# Patient Record
Sex: Female | Born: 1955 | Race: White | Hispanic: No | Smoking: Never smoker
Health system: Southern US, Community
[De-identification: ages and names within clinical notes are randomized; demographics above are authoritative.]

## PROBLEM LIST (undated history)

## (undated) DIAGNOSIS — R569 Unspecified convulsions: Secondary | ICD-10-CM

## (undated) DIAGNOSIS — B019 Varicella without complication: Secondary | ICD-10-CM

## (undated) DIAGNOSIS — Z973 Presence of spectacles and contact lenses: Secondary | ICD-10-CM

## (undated) DIAGNOSIS — Z950 Presence of cardiac pacemaker: Secondary | ICD-10-CM

## (undated) DIAGNOSIS — Z86010 Personal history of colonic polyps: Secondary | ICD-10-CM

## (undated) DIAGNOSIS — H9190 Unspecified hearing loss, unspecified ear: Secondary | ICD-10-CM

## (undated) DIAGNOSIS — G40909 Epilepsy, unspecified, not intractable, without status epilepticus: Secondary | ICD-10-CM

## (undated) DIAGNOSIS — H902 Conductive hearing loss, unspecified: Secondary | ICD-10-CM

## (undated) HISTORY — DX: Conductive hearing loss, unspecified: H90.2

## (undated) HISTORY — DX: Presence of cardiac pacemaker: Z95.0

## (undated) HISTORY — DX: Epilepsy, unspecified, not intractable, without status epilepticus: G40.909

## (undated) HISTORY — DX: Unspecified convulsions: R56.9

## (undated) HISTORY — DX: Varicella without complication: B01.9

## (undated) HISTORY — DX: Unspecified hearing loss, unspecified ear: H91.90

## (undated) HISTORY — DX: Personal history of colonic polyps: Z86.010

## (undated) HISTORY — PX: HIP ARTHROPLASTY: SHX981

---

## 1997-10-07 ENCOUNTER — Other Ambulatory Visit: Admission: RE | Admit: 1997-10-07 | Discharge: 1997-10-07 | Payer: Self-pay

## 2001-04-05 ENCOUNTER — Emergency Department (HOSPITAL_COMMUNITY): Admission: EM | Admit: 2001-04-05 | Discharge: 2001-04-05 | Payer: Self-pay | Admitting: Emergency Medicine

## 2001-04-05 ENCOUNTER — Encounter: Payer: Self-pay | Admitting: Emergency Medicine

## 2010-07-31 HISTORY — PX: PACEMAKER INSERTION: SHX728

## 2011-05-22 DIAGNOSIS — G40209 Localization-related (focal) (partial) symptomatic epilepsy and epileptic syndromes with complex partial seizures, not intractable, without status epilepticus: Secondary | ICD-10-CM | POA: Diagnosis not present

## 2011-05-31 DIAGNOSIS — W19XXXA Unspecified fall, initial encounter: Secondary | ICD-10-CM | POA: Diagnosis not present

## 2011-05-31 DIAGNOSIS — R55 Syncope and collapse: Secondary | ICD-10-CM | POA: Diagnosis not present

## 2011-05-31 DIAGNOSIS — Z950 Presence of cardiac pacemaker: Secondary | ICD-10-CM | POA: Diagnosis not present

## 2011-05-31 DIAGNOSIS — H913 Deaf nonspeaking, not elsewhere classified: Secondary | ICD-10-CM | POA: Diagnosis not present

## 2011-05-31 DIAGNOSIS — M1610 Unilateral primary osteoarthritis, unspecified hip: Secondary | ICD-10-CM | POA: Diagnosis not present

## 2011-06-27 DIAGNOSIS — M169 Osteoarthritis of hip, unspecified: Secondary | ICD-10-CM | POA: Diagnosis not present

## 2011-06-27 DIAGNOSIS — M25559 Pain in unspecified hip: Secondary | ICD-10-CM | POA: Diagnosis not present

## 2011-06-27 DIAGNOSIS — M1610 Unilateral primary osteoarthritis, unspecified hip: Secondary | ICD-10-CM | POA: Diagnosis not present

## 2011-07-05 DIAGNOSIS — G40209 Localization-related (focal) (partial) symptomatic epilepsy and epileptic syndromes with complex partial seizures, not intractable, without status epilepticus: Secondary | ICD-10-CM | POA: Diagnosis not present

## 2011-07-06 DIAGNOSIS — M1610 Unilateral primary osteoarthritis, unspecified hip: Secondary | ICD-10-CM | POA: Diagnosis not present

## 2011-07-06 DIAGNOSIS — M25559 Pain in unspecified hip: Secondary | ICD-10-CM | POA: Diagnosis not present

## 2011-07-06 DIAGNOSIS — M169 Osteoarthritis of hip, unspecified: Secondary | ICD-10-CM | POA: Diagnosis not present

## 2011-07-13 DIAGNOSIS — Z01818 Encounter for other preprocedural examination: Secondary | ICD-10-CM | POA: Diagnosis not present

## 2011-07-20 DIAGNOSIS — Z01818 Encounter for other preprocedural examination: Secondary | ICD-10-CM | POA: Diagnosis not present

## 2011-07-21 DIAGNOSIS — H913 Deaf nonspeaking, not elsewhere classified: Secondary | ICD-10-CM | POA: Diagnosis not present

## 2011-07-21 DIAGNOSIS — R42 Dizziness and giddiness: Secondary | ICD-10-CM | POA: Diagnosis not present

## 2011-07-21 DIAGNOSIS — I469 Cardiac arrest, cause unspecified: Secondary | ICD-10-CM | POA: Diagnosis not present

## 2011-07-21 DIAGNOSIS — Z950 Presence of cardiac pacemaker: Secondary | ICD-10-CM | POA: Diagnosis not present

## 2011-07-31 HISTORY — PX: TOTAL HIP ARTHROPLASTY: SHX124

## 2011-08-02 DIAGNOSIS — I2510 Atherosclerotic heart disease of native coronary artery without angina pectoris: Secondary | ICD-10-CM | POA: Diagnosis present

## 2011-08-02 DIAGNOSIS — Z471 Aftercare following joint replacement surgery: Secondary | ICD-10-CM | POA: Diagnosis not present

## 2011-08-02 DIAGNOSIS — Z950 Presence of cardiac pacemaker: Secondary | ICD-10-CM | POA: Diagnosis not present

## 2011-08-02 DIAGNOSIS — G40909 Epilepsy, unspecified, not intractable, without status epilepticus: Secondary | ICD-10-CM | POA: Diagnosis present

## 2011-08-02 DIAGNOSIS — Z96649 Presence of unspecified artificial hip joint: Secondary | ICD-10-CM | POA: Diagnosis not present

## 2011-08-02 DIAGNOSIS — M169 Osteoarthritis of hip, unspecified: Secondary | ICD-10-CM | POA: Diagnosis not present

## 2011-08-02 DIAGNOSIS — G40802 Other epilepsy, not intractable, without status epilepticus: Secondary | ICD-10-CM | POA: Diagnosis not present

## 2011-08-02 DIAGNOSIS — M1610 Unilateral primary osteoarthritis, unspecified hip: Secondary | ICD-10-CM | POA: Diagnosis not present

## 2011-08-02 DIAGNOSIS — H9190 Unspecified hearing loss, unspecified ear: Secondary | ICD-10-CM | POA: Diagnosis present

## 2011-08-06 DIAGNOSIS — M1710 Unilateral primary osteoarthritis, unspecified knee: Secondary | ICD-10-CM | POA: Diagnosis not present

## 2011-08-06 DIAGNOSIS — Z96649 Presence of unspecified artificial hip joint: Secondary | ICD-10-CM | POA: Diagnosis not present

## 2011-08-06 DIAGNOSIS — G40209 Localization-related (focal) (partial) symptomatic epilepsy and epileptic syndromes with complex partial seizures, not intractable, without status epilepticus: Secondary | ICD-10-CM | POA: Diagnosis not present

## 2011-08-06 DIAGNOSIS — Z471 Aftercare following joint replacement surgery: Secondary | ICD-10-CM | POA: Diagnosis not present

## 2011-08-06 DIAGNOSIS — M1610 Unilateral primary osteoarthritis, unspecified hip: Secondary | ICD-10-CM | POA: Diagnosis not present

## 2011-08-06 DIAGNOSIS — H913 Deaf nonspeaking, not elsewhere classified: Secondary | ICD-10-CM | POA: Diagnosis not present

## 2011-08-06 DIAGNOSIS — R269 Unspecified abnormalities of gait and mobility: Secondary | ICD-10-CM | POA: Diagnosis not present

## 2011-08-07 DIAGNOSIS — Z96649 Presence of unspecified artificial hip joint: Secondary | ICD-10-CM | POA: Diagnosis not present

## 2011-08-07 DIAGNOSIS — M1710 Unilateral primary osteoarthritis, unspecified knee: Secondary | ICD-10-CM | POA: Diagnosis not present

## 2011-08-07 DIAGNOSIS — R269 Unspecified abnormalities of gait and mobility: Secondary | ICD-10-CM | POA: Diagnosis not present

## 2011-08-07 DIAGNOSIS — G40209 Localization-related (focal) (partial) symptomatic epilepsy and epileptic syndromes with complex partial seizures, not intractable, without status epilepticus: Secondary | ICD-10-CM | POA: Diagnosis not present

## 2011-08-07 DIAGNOSIS — Z471 Aftercare following joint replacement surgery: Secondary | ICD-10-CM | POA: Diagnosis not present

## 2011-08-07 DIAGNOSIS — H913 Deaf nonspeaking, not elsewhere classified: Secondary | ICD-10-CM | POA: Diagnosis not present

## 2011-08-08 DIAGNOSIS — Z471 Aftercare following joint replacement surgery: Secondary | ICD-10-CM | POA: Diagnosis not present

## 2011-08-08 DIAGNOSIS — G40209 Localization-related (focal) (partial) symptomatic epilepsy and epileptic syndromes with complex partial seizures, not intractable, without status epilepticus: Secondary | ICD-10-CM | POA: Diagnosis not present

## 2011-08-08 DIAGNOSIS — M1710 Unilateral primary osteoarthritis, unspecified knee: Secondary | ICD-10-CM | POA: Diagnosis not present

## 2011-08-08 DIAGNOSIS — R269 Unspecified abnormalities of gait and mobility: Secondary | ICD-10-CM | POA: Diagnosis not present

## 2011-08-08 DIAGNOSIS — H913 Deaf nonspeaking, not elsewhere classified: Secondary | ICD-10-CM | POA: Diagnosis not present

## 2011-08-08 DIAGNOSIS — Z96649 Presence of unspecified artificial hip joint: Secondary | ICD-10-CM | POA: Diagnosis not present

## 2011-08-09 DIAGNOSIS — Z471 Aftercare following joint replacement surgery: Secondary | ICD-10-CM | POA: Diagnosis not present

## 2011-08-09 DIAGNOSIS — H913 Deaf nonspeaking, not elsewhere classified: Secondary | ICD-10-CM | POA: Diagnosis not present

## 2011-08-09 DIAGNOSIS — M1710 Unilateral primary osteoarthritis, unspecified knee: Secondary | ICD-10-CM | POA: Diagnosis not present

## 2011-08-09 DIAGNOSIS — G40209 Localization-related (focal) (partial) symptomatic epilepsy and epileptic syndromes with complex partial seizures, not intractable, without status epilepticus: Secondary | ICD-10-CM | POA: Diagnosis not present

## 2011-08-09 DIAGNOSIS — Z96649 Presence of unspecified artificial hip joint: Secondary | ICD-10-CM | POA: Diagnosis not present

## 2011-08-09 DIAGNOSIS — R269 Unspecified abnormalities of gait and mobility: Secondary | ICD-10-CM | POA: Diagnosis not present

## 2011-08-10 DIAGNOSIS — M1710 Unilateral primary osteoarthritis, unspecified knee: Secondary | ICD-10-CM | POA: Diagnosis not present

## 2011-08-10 DIAGNOSIS — Z471 Aftercare following joint replacement surgery: Secondary | ICD-10-CM | POA: Diagnosis not present

## 2011-08-10 DIAGNOSIS — Z96649 Presence of unspecified artificial hip joint: Secondary | ICD-10-CM | POA: Diagnosis not present

## 2011-08-10 DIAGNOSIS — G40209 Localization-related (focal) (partial) symptomatic epilepsy and epileptic syndromes with complex partial seizures, not intractable, without status epilepticus: Secondary | ICD-10-CM | POA: Diagnosis not present

## 2011-08-10 DIAGNOSIS — R269 Unspecified abnormalities of gait and mobility: Secondary | ICD-10-CM | POA: Diagnosis not present

## 2011-08-10 DIAGNOSIS — H913 Deaf nonspeaking, not elsewhere classified: Secondary | ICD-10-CM | POA: Diagnosis not present

## 2011-08-11 DIAGNOSIS — H913 Deaf nonspeaking, not elsewhere classified: Secondary | ICD-10-CM | POA: Diagnosis not present

## 2011-08-11 DIAGNOSIS — Z96649 Presence of unspecified artificial hip joint: Secondary | ICD-10-CM | POA: Diagnosis not present

## 2011-08-11 DIAGNOSIS — R269 Unspecified abnormalities of gait and mobility: Secondary | ICD-10-CM | POA: Diagnosis not present

## 2011-08-11 DIAGNOSIS — M1710 Unilateral primary osteoarthritis, unspecified knee: Secondary | ICD-10-CM | POA: Diagnosis not present

## 2011-08-11 DIAGNOSIS — G40209 Localization-related (focal) (partial) symptomatic epilepsy and epileptic syndromes with complex partial seizures, not intractable, without status epilepticus: Secondary | ICD-10-CM | POA: Diagnosis not present

## 2011-08-11 DIAGNOSIS — Z471 Aftercare following joint replacement surgery: Secondary | ICD-10-CM | POA: Diagnosis not present

## 2011-08-14 DIAGNOSIS — Z96649 Presence of unspecified artificial hip joint: Secondary | ICD-10-CM | POA: Diagnosis not present

## 2011-08-14 DIAGNOSIS — Z471 Aftercare following joint replacement surgery: Secondary | ICD-10-CM | POA: Diagnosis not present

## 2011-08-14 DIAGNOSIS — R269 Unspecified abnormalities of gait and mobility: Secondary | ICD-10-CM | POA: Diagnosis not present

## 2011-08-14 DIAGNOSIS — G40209 Localization-related (focal) (partial) symptomatic epilepsy and epileptic syndromes with complex partial seizures, not intractable, without status epilepticus: Secondary | ICD-10-CM | POA: Diagnosis not present

## 2011-08-14 DIAGNOSIS — M1710 Unilateral primary osteoarthritis, unspecified knee: Secondary | ICD-10-CM | POA: Diagnosis not present

## 2011-08-14 DIAGNOSIS — H913 Deaf nonspeaking, not elsewhere classified: Secondary | ICD-10-CM | POA: Diagnosis not present

## 2011-08-16 DIAGNOSIS — Z471 Aftercare following joint replacement surgery: Secondary | ICD-10-CM | POA: Diagnosis not present

## 2011-08-16 DIAGNOSIS — G40209 Localization-related (focal) (partial) symptomatic epilepsy and epileptic syndromes with complex partial seizures, not intractable, without status epilepticus: Secondary | ICD-10-CM | POA: Diagnosis not present

## 2011-08-16 DIAGNOSIS — H913 Deaf nonspeaking, not elsewhere classified: Secondary | ICD-10-CM | POA: Diagnosis not present

## 2011-08-16 DIAGNOSIS — Z96649 Presence of unspecified artificial hip joint: Secondary | ICD-10-CM | POA: Diagnosis not present

## 2011-08-16 DIAGNOSIS — M1710 Unilateral primary osteoarthritis, unspecified knee: Secondary | ICD-10-CM | POA: Diagnosis not present

## 2011-08-16 DIAGNOSIS — R269 Unspecified abnormalities of gait and mobility: Secondary | ICD-10-CM | POA: Diagnosis not present

## 2011-08-18 DIAGNOSIS — Z96649 Presence of unspecified artificial hip joint: Secondary | ICD-10-CM | POA: Diagnosis not present

## 2011-08-18 DIAGNOSIS — M1710 Unilateral primary osteoarthritis, unspecified knee: Secondary | ICD-10-CM | POA: Diagnosis not present

## 2011-08-18 DIAGNOSIS — R269 Unspecified abnormalities of gait and mobility: Secondary | ICD-10-CM | POA: Diagnosis not present

## 2011-08-18 DIAGNOSIS — G40209 Localization-related (focal) (partial) symptomatic epilepsy and epileptic syndromes with complex partial seizures, not intractable, without status epilepticus: Secondary | ICD-10-CM | POA: Diagnosis not present

## 2011-08-18 DIAGNOSIS — H913 Deaf nonspeaking, not elsewhere classified: Secondary | ICD-10-CM | POA: Diagnosis not present

## 2011-08-18 DIAGNOSIS — Z471 Aftercare following joint replacement surgery: Secondary | ICD-10-CM | POA: Diagnosis not present

## 2011-08-21 DIAGNOSIS — M1710 Unilateral primary osteoarthritis, unspecified knee: Secondary | ICD-10-CM | POA: Diagnosis not present

## 2011-08-21 DIAGNOSIS — G40209 Localization-related (focal) (partial) symptomatic epilepsy and epileptic syndromes with complex partial seizures, not intractable, without status epilepticus: Secondary | ICD-10-CM | POA: Diagnosis not present

## 2011-08-21 DIAGNOSIS — H913 Deaf nonspeaking, not elsewhere classified: Secondary | ICD-10-CM | POA: Diagnosis not present

## 2011-08-21 DIAGNOSIS — R269 Unspecified abnormalities of gait and mobility: Secondary | ICD-10-CM | POA: Diagnosis not present

## 2011-08-21 DIAGNOSIS — Z96649 Presence of unspecified artificial hip joint: Secondary | ICD-10-CM | POA: Diagnosis not present

## 2011-08-21 DIAGNOSIS — Z471 Aftercare following joint replacement surgery: Secondary | ICD-10-CM | POA: Diagnosis not present

## 2011-08-22 DIAGNOSIS — R269 Unspecified abnormalities of gait and mobility: Secondary | ICD-10-CM | POA: Diagnosis not present

## 2011-08-22 DIAGNOSIS — Z471 Aftercare following joint replacement surgery: Secondary | ICD-10-CM | POA: Diagnosis not present

## 2011-08-22 DIAGNOSIS — G40209 Localization-related (focal) (partial) symptomatic epilepsy and epileptic syndromes with complex partial seizures, not intractable, without status epilepticus: Secondary | ICD-10-CM | POA: Diagnosis not present

## 2011-08-22 DIAGNOSIS — Z96649 Presence of unspecified artificial hip joint: Secondary | ICD-10-CM | POA: Diagnosis not present

## 2011-08-22 DIAGNOSIS — H913 Deaf nonspeaking, not elsewhere classified: Secondary | ICD-10-CM | POA: Diagnosis not present

## 2011-08-22 DIAGNOSIS — M1710 Unilateral primary osteoarthritis, unspecified knee: Secondary | ICD-10-CM | POA: Diagnosis not present

## 2011-08-23 DIAGNOSIS — G40209 Localization-related (focal) (partial) symptomatic epilepsy and epileptic syndromes with complex partial seizures, not intractable, without status epilepticus: Secondary | ICD-10-CM | POA: Diagnosis not present

## 2011-08-23 DIAGNOSIS — Z96649 Presence of unspecified artificial hip joint: Secondary | ICD-10-CM | POA: Diagnosis not present

## 2011-08-23 DIAGNOSIS — M1710 Unilateral primary osteoarthritis, unspecified knee: Secondary | ICD-10-CM | POA: Diagnosis not present

## 2011-08-23 DIAGNOSIS — H913 Deaf nonspeaking, not elsewhere classified: Secondary | ICD-10-CM | POA: Diagnosis not present

## 2011-08-23 DIAGNOSIS — Z471 Aftercare following joint replacement surgery: Secondary | ICD-10-CM | POA: Diagnosis not present

## 2011-08-23 DIAGNOSIS — R269 Unspecified abnormalities of gait and mobility: Secondary | ICD-10-CM | POA: Diagnosis not present

## 2011-08-24 DIAGNOSIS — Z471 Aftercare following joint replacement surgery: Secondary | ICD-10-CM | POA: Diagnosis not present

## 2011-08-24 DIAGNOSIS — G40209 Localization-related (focal) (partial) symptomatic epilepsy and epileptic syndromes with complex partial seizures, not intractable, without status epilepticus: Secondary | ICD-10-CM | POA: Diagnosis not present

## 2011-08-24 DIAGNOSIS — M1710 Unilateral primary osteoarthritis, unspecified knee: Secondary | ICD-10-CM | POA: Diagnosis not present

## 2011-08-24 DIAGNOSIS — R269 Unspecified abnormalities of gait and mobility: Secondary | ICD-10-CM | POA: Diagnosis not present

## 2011-08-24 DIAGNOSIS — Z96649 Presence of unspecified artificial hip joint: Secondary | ICD-10-CM | POA: Diagnosis not present

## 2011-08-24 DIAGNOSIS — H913 Deaf nonspeaking, not elsewhere classified: Secondary | ICD-10-CM | POA: Diagnosis not present

## 2011-08-25 DIAGNOSIS — Z96649 Presence of unspecified artificial hip joint: Secondary | ICD-10-CM | POA: Diagnosis not present

## 2011-08-25 DIAGNOSIS — M1710 Unilateral primary osteoarthritis, unspecified knee: Secondary | ICD-10-CM | POA: Diagnosis not present

## 2011-08-25 DIAGNOSIS — G40209 Localization-related (focal) (partial) symptomatic epilepsy and epileptic syndromes with complex partial seizures, not intractable, without status epilepticus: Secondary | ICD-10-CM | POA: Diagnosis not present

## 2011-08-25 DIAGNOSIS — R269 Unspecified abnormalities of gait and mobility: Secondary | ICD-10-CM | POA: Diagnosis not present

## 2011-08-25 DIAGNOSIS — H913 Deaf nonspeaking, not elsewhere classified: Secondary | ICD-10-CM | POA: Diagnosis not present

## 2011-08-25 DIAGNOSIS — Z471 Aftercare following joint replacement surgery: Secondary | ICD-10-CM | POA: Diagnosis not present

## 2011-08-28 DIAGNOSIS — H913 Deaf nonspeaking, not elsewhere classified: Secondary | ICD-10-CM | POA: Diagnosis not present

## 2011-08-28 DIAGNOSIS — Z471 Aftercare following joint replacement surgery: Secondary | ICD-10-CM | POA: Diagnosis not present

## 2011-08-28 DIAGNOSIS — R269 Unspecified abnormalities of gait and mobility: Secondary | ICD-10-CM | POA: Diagnosis not present

## 2011-08-28 DIAGNOSIS — M1710 Unilateral primary osteoarthritis, unspecified knee: Secondary | ICD-10-CM | POA: Diagnosis not present

## 2011-08-28 DIAGNOSIS — G40209 Localization-related (focal) (partial) symptomatic epilepsy and epileptic syndromes with complex partial seizures, not intractable, without status epilepticus: Secondary | ICD-10-CM | POA: Diagnosis not present

## 2011-08-28 DIAGNOSIS — Z96649 Presence of unspecified artificial hip joint: Secondary | ICD-10-CM | POA: Diagnosis not present

## 2011-08-29 DIAGNOSIS — M1710 Unilateral primary osteoarthritis, unspecified knee: Secondary | ICD-10-CM | POA: Diagnosis not present

## 2011-08-29 DIAGNOSIS — H913 Deaf nonspeaking, not elsewhere classified: Secondary | ICD-10-CM | POA: Diagnosis not present

## 2011-08-29 DIAGNOSIS — Z96649 Presence of unspecified artificial hip joint: Secondary | ICD-10-CM | POA: Diagnosis not present

## 2011-08-29 DIAGNOSIS — G40209 Localization-related (focal) (partial) symptomatic epilepsy and epileptic syndromes with complex partial seizures, not intractable, without status epilepticus: Secondary | ICD-10-CM | POA: Diagnosis not present

## 2011-08-29 DIAGNOSIS — R269 Unspecified abnormalities of gait and mobility: Secondary | ICD-10-CM | POA: Diagnosis not present

## 2011-08-29 DIAGNOSIS — Z471 Aftercare following joint replacement surgery: Secondary | ICD-10-CM | POA: Diagnosis not present

## 2011-09-01 DIAGNOSIS — H913 Deaf nonspeaking, not elsewhere classified: Secondary | ICD-10-CM | POA: Diagnosis not present

## 2011-09-01 DIAGNOSIS — Z96649 Presence of unspecified artificial hip joint: Secondary | ICD-10-CM | POA: Diagnosis not present

## 2011-09-01 DIAGNOSIS — G40209 Localization-related (focal) (partial) symptomatic epilepsy and epileptic syndromes with complex partial seizures, not intractable, without status epilepticus: Secondary | ICD-10-CM | POA: Diagnosis not present

## 2011-09-01 DIAGNOSIS — R269 Unspecified abnormalities of gait and mobility: Secondary | ICD-10-CM | POA: Diagnosis not present

## 2011-09-01 DIAGNOSIS — M1710 Unilateral primary osteoarthritis, unspecified knee: Secondary | ICD-10-CM | POA: Diagnosis not present

## 2011-09-01 DIAGNOSIS — Z471 Aftercare following joint replacement surgery: Secondary | ICD-10-CM | POA: Diagnosis not present

## 2011-09-04 DIAGNOSIS — M1610 Unilateral primary osteoarthritis, unspecified hip: Secondary | ICD-10-CM | POA: Diagnosis not present

## 2011-09-27 DIAGNOSIS — M1610 Unilateral primary osteoarthritis, unspecified hip: Secondary | ICD-10-CM | POA: Diagnosis not present

## 2011-09-27 DIAGNOSIS — I469 Cardiac arrest, cause unspecified: Secondary | ICD-10-CM | POA: Diagnosis not present

## 2011-09-28 DIAGNOSIS — Z950 Presence of cardiac pacemaker: Secondary | ICD-10-CM | POA: Diagnosis not present

## 2011-10-10 DIAGNOSIS — G40209 Localization-related (focal) (partial) symptomatic epilepsy and epileptic syndromes with complex partial seizures, not intractable, without status epilepticus: Secondary | ICD-10-CM | POA: Diagnosis not present

## 2012-01-03 DIAGNOSIS — R42 Dizziness and giddiness: Secondary | ICD-10-CM | POA: Diagnosis not present

## 2012-01-03 DIAGNOSIS — Z950 Presence of cardiac pacemaker: Secondary | ICD-10-CM | POA: Diagnosis not present

## 2012-01-03 DIAGNOSIS — G40209 Localization-related (focal) (partial) symptomatic epilepsy and epileptic syndromes with complex partial seizures, not intractable, without status epilepticus: Secondary | ICD-10-CM | POA: Diagnosis not present

## 2012-01-17 DIAGNOSIS — G40209 Localization-related (focal) (partial) symptomatic epilepsy and epileptic syndromes with complex partial seizures, not intractable, without status epilepticus: Secondary | ICD-10-CM | POA: Diagnosis not present

## 2012-02-09 DIAGNOSIS — M1610 Unilateral primary osteoarthritis, unspecified hip: Secondary | ICD-10-CM | POA: Diagnosis not present

## 2012-02-09 DIAGNOSIS — M25559 Pain in unspecified hip: Secondary | ICD-10-CM | POA: Diagnosis not present

## 2012-02-20 DIAGNOSIS — M1610 Unilateral primary osteoarthritis, unspecified hip: Secondary | ICD-10-CM | POA: Diagnosis not present

## 2012-02-20 DIAGNOSIS — M169 Osteoarthritis of hip, unspecified: Secondary | ICD-10-CM | POA: Diagnosis not present

## 2012-02-20 DIAGNOSIS — Z01811 Encounter for preprocedural respiratory examination: Secondary | ICD-10-CM | POA: Diagnosis not present

## 2012-02-21 DIAGNOSIS — Z23 Encounter for immunization: Secondary | ICD-10-CM | POA: Diagnosis not present

## 2012-02-22 DIAGNOSIS — M1610 Unilateral primary osteoarthritis, unspecified hip: Secondary | ICD-10-CM | POA: Diagnosis not present

## 2012-02-22 DIAGNOSIS — I469 Cardiac arrest, cause unspecified: Secondary | ICD-10-CM | POA: Diagnosis not present

## 2012-02-22 DIAGNOSIS — Z950 Presence of cardiac pacemaker: Secondary | ICD-10-CM | POA: Diagnosis not present

## 2012-02-22 DIAGNOSIS — R42 Dizziness and giddiness: Secondary | ICD-10-CM | POA: Diagnosis not present

## 2012-02-28 DIAGNOSIS — Z950 Presence of cardiac pacemaker: Secondary | ICD-10-CM | POA: Diagnosis not present

## 2012-02-28 DIAGNOSIS — M25559 Pain in unspecified hip: Secondary | ICD-10-CM | POA: Diagnosis not present

## 2012-03-01 HISTORY — PX: TOTAL HIP ARTHROPLASTY: SHX124

## 2012-03-06 DIAGNOSIS — D62 Acute posthemorrhagic anemia: Secondary | ICD-10-CM | POA: Diagnosis not present

## 2012-03-06 DIAGNOSIS — I499 Cardiac arrhythmia, unspecified: Secondary | ICD-10-CM | POA: Diagnosis not present

## 2012-03-06 DIAGNOSIS — M1610 Unilateral primary osteoarthritis, unspecified hip: Secondary | ICD-10-CM | POA: Diagnosis present

## 2012-03-06 DIAGNOSIS — M169 Osteoarthritis of hip, unspecified: Secondary | ICD-10-CM | POA: Diagnosis not present

## 2012-03-06 DIAGNOSIS — R569 Unspecified convulsions: Secondary | ICD-10-CM | POA: Diagnosis not present

## 2012-03-06 DIAGNOSIS — H9190 Unspecified hearing loss, unspecified ear: Secondary | ICD-10-CM | POA: Diagnosis not present

## 2012-03-06 DIAGNOSIS — G40209 Localization-related (focal) (partial) symptomatic epilepsy and epileptic syndromes with complex partial seizures, not intractable, without status epilepticus: Secondary | ICD-10-CM | POA: Diagnosis present

## 2012-03-06 DIAGNOSIS — Z950 Presence of cardiac pacemaker: Secondary | ICD-10-CM | POA: Diagnosis not present

## 2012-03-06 DIAGNOSIS — Z96649 Presence of unspecified artificial hip joint: Secondary | ICD-10-CM | POA: Diagnosis not present

## 2012-03-06 DIAGNOSIS — Z471 Aftercare following joint replacement surgery: Secondary | ICD-10-CM | POA: Diagnosis not present

## 2012-03-10 DIAGNOSIS — M159 Polyosteoarthritis, unspecified: Secondary | ICD-10-CM | POA: Diagnosis not present

## 2012-03-10 DIAGNOSIS — G40909 Epilepsy, unspecified, not intractable, without status epilepticus: Secondary | ICD-10-CM | POA: Diagnosis not present

## 2012-03-10 DIAGNOSIS — Z471 Aftercare following joint replacement surgery: Secondary | ICD-10-CM | POA: Diagnosis not present

## 2012-03-10 DIAGNOSIS — Z950 Presence of cardiac pacemaker: Secondary | ICD-10-CM | POA: Diagnosis not present

## 2012-03-10 DIAGNOSIS — Z8674 Personal history of sudden cardiac arrest: Secondary | ICD-10-CM | POA: Diagnosis not present

## 2012-03-10 DIAGNOSIS — N393 Stress incontinence (female) (male): Secondary | ICD-10-CM | POA: Diagnosis not present

## 2012-03-10 DIAGNOSIS — H913 Deaf nonspeaking, not elsewhere classified: Secondary | ICD-10-CM | POA: Diagnosis not present

## 2012-03-10 DIAGNOSIS — E119 Type 2 diabetes mellitus without complications: Secondary | ICD-10-CM | POA: Diagnosis not present

## 2012-03-10 DIAGNOSIS — Z96649 Presence of unspecified artificial hip joint: Secondary | ICD-10-CM | POA: Diagnosis not present

## 2012-03-10 DIAGNOSIS — M1610 Unilateral primary osteoarthritis, unspecified hip: Secondary | ICD-10-CM | POA: Diagnosis not present

## 2012-03-11 DIAGNOSIS — Z96649 Presence of unspecified artificial hip joint: Secondary | ICD-10-CM | POA: Diagnosis not present

## 2012-03-11 DIAGNOSIS — G40909 Epilepsy, unspecified, not intractable, without status epilepticus: Secondary | ICD-10-CM | POA: Diagnosis not present

## 2012-03-11 DIAGNOSIS — E119 Type 2 diabetes mellitus without complications: Secondary | ICD-10-CM | POA: Diagnosis not present

## 2012-03-11 DIAGNOSIS — Z471 Aftercare following joint replacement surgery: Secondary | ICD-10-CM | POA: Diagnosis not present

## 2012-03-11 DIAGNOSIS — M159 Polyosteoarthritis, unspecified: Secondary | ICD-10-CM | POA: Diagnosis not present

## 2012-03-11 DIAGNOSIS — H913 Deaf nonspeaking, not elsewhere classified: Secondary | ICD-10-CM | POA: Diagnosis not present

## 2012-03-12 DIAGNOSIS — H913 Deaf nonspeaking, not elsewhere classified: Secondary | ICD-10-CM | POA: Diagnosis not present

## 2012-03-12 DIAGNOSIS — G40909 Epilepsy, unspecified, not intractable, without status epilepticus: Secondary | ICD-10-CM | POA: Diagnosis not present

## 2012-03-12 DIAGNOSIS — Z96649 Presence of unspecified artificial hip joint: Secondary | ICD-10-CM | POA: Diagnosis not present

## 2012-03-12 DIAGNOSIS — Z471 Aftercare following joint replacement surgery: Secondary | ICD-10-CM | POA: Diagnosis not present

## 2012-03-12 DIAGNOSIS — M159 Polyosteoarthritis, unspecified: Secondary | ICD-10-CM | POA: Diagnosis not present

## 2012-03-12 DIAGNOSIS — E119 Type 2 diabetes mellitus without complications: Secondary | ICD-10-CM | POA: Diagnosis not present

## 2012-03-13 DIAGNOSIS — Z471 Aftercare following joint replacement surgery: Secondary | ICD-10-CM | POA: Diagnosis not present

## 2012-03-13 DIAGNOSIS — E119 Type 2 diabetes mellitus without complications: Secondary | ICD-10-CM | POA: Diagnosis not present

## 2012-03-13 DIAGNOSIS — G40909 Epilepsy, unspecified, not intractable, without status epilepticus: Secondary | ICD-10-CM | POA: Diagnosis not present

## 2012-03-13 DIAGNOSIS — H913 Deaf nonspeaking, not elsewhere classified: Secondary | ICD-10-CM | POA: Diagnosis not present

## 2012-03-13 DIAGNOSIS — Z96649 Presence of unspecified artificial hip joint: Secondary | ICD-10-CM | POA: Diagnosis not present

## 2012-03-13 DIAGNOSIS — M159 Polyosteoarthritis, unspecified: Secondary | ICD-10-CM | POA: Diagnosis not present

## 2012-03-14 DIAGNOSIS — Z96649 Presence of unspecified artificial hip joint: Secondary | ICD-10-CM | POA: Diagnosis not present

## 2012-03-14 DIAGNOSIS — E119 Type 2 diabetes mellitus without complications: Secondary | ICD-10-CM | POA: Diagnosis not present

## 2012-03-14 DIAGNOSIS — G40909 Epilepsy, unspecified, not intractable, without status epilepticus: Secondary | ICD-10-CM | POA: Diagnosis not present

## 2012-03-14 DIAGNOSIS — Z471 Aftercare following joint replacement surgery: Secondary | ICD-10-CM | POA: Diagnosis not present

## 2012-03-14 DIAGNOSIS — H913 Deaf nonspeaking, not elsewhere classified: Secondary | ICD-10-CM | POA: Diagnosis not present

## 2012-03-14 DIAGNOSIS — M159 Polyosteoarthritis, unspecified: Secondary | ICD-10-CM | POA: Diagnosis not present

## 2012-03-15 DIAGNOSIS — G40909 Epilepsy, unspecified, not intractable, without status epilepticus: Secondary | ICD-10-CM | POA: Diagnosis not present

## 2012-03-15 DIAGNOSIS — M159 Polyosteoarthritis, unspecified: Secondary | ICD-10-CM | POA: Diagnosis not present

## 2012-03-15 DIAGNOSIS — Z96649 Presence of unspecified artificial hip joint: Secondary | ICD-10-CM | POA: Diagnosis not present

## 2012-03-15 DIAGNOSIS — E119 Type 2 diabetes mellitus without complications: Secondary | ICD-10-CM | POA: Diagnosis not present

## 2012-03-15 DIAGNOSIS — H913 Deaf nonspeaking, not elsewhere classified: Secondary | ICD-10-CM | POA: Diagnosis not present

## 2012-03-15 DIAGNOSIS — Z471 Aftercare following joint replacement surgery: Secondary | ICD-10-CM | POA: Diagnosis not present

## 2012-03-18 DIAGNOSIS — Z471 Aftercare following joint replacement surgery: Secondary | ICD-10-CM | POA: Diagnosis not present

## 2012-03-18 DIAGNOSIS — Z96649 Presence of unspecified artificial hip joint: Secondary | ICD-10-CM | POA: Diagnosis not present

## 2012-03-18 DIAGNOSIS — G40909 Epilepsy, unspecified, not intractable, without status epilepticus: Secondary | ICD-10-CM | POA: Diagnosis not present

## 2012-03-18 DIAGNOSIS — M159 Polyosteoarthritis, unspecified: Secondary | ICD-10-CM | POA: Diagnosis not present

## 2012-03-18 DIAGNOSIS — H913 Deaf nonspeaking, not elsewhere classified: Secondary | ICD-10-CM | POA: Diagnosis not present

## 2012-03-18 DIAGNOSIS — E119 Type 2 diabetes mellitus without complications: Secondary | ICD-10-CM | POA: Diagnosis not present

## 2012-03-19 DIAGNOSIS — Z471 Aftercare following joint replacement surgery: Secondary | ICD-10-CM | POA: Diagnosis not present

## 2012-03-19 DIAGNOSIS — H913 Deaf nonspeaking, not elsewhere classified: Secondary | ICD-10-CM | POA: Diagnosis not present

## 2012-03-19 DIAGNOSIS — M159 Polyosteoarthritis, unspecified: Secondary | ICD-10-CM | POA: Diagnosis not present

## 2012-03-19 DIAGNOSIS — E119 Type 2 diabetes mellitus without complications: Secondary | ICD-10-CM | POA: Diagnosis not present

## 2012-03-19 DIAGNOSIS — Z96649 Presence of unspecified artificial hip joint: Secondary | ICD-10-CM | POA: Diagnosis not present

## 2012-03-19 DIAGNOSIS — G40909 Epilepsy, unspecified, not intractable, without status epilepticus: Secondary | ICD-10-CM | POA: Diagnosis not present

## 2012-03-21 DIAGNOSIS — E119 Type 2 diabetes mellitus without complications: Secondary | ICD-10-CM | POA: Diagnosis not present

## 2012-03-21 DIAGNOSIS — Z471 Aftercare following joint replacement surgery: Secondary | ICD-10-CM | POA: Diagnosis not present

## 2012-03-21 DIAGNOSIS — Z96649 Presence of unspecified artificial hip joint: Secondary | ICD-10-CM | POA: Diagnosis not present

## 2012-03-21 DIAGNOSIS — H913 Deaf nonspeaking, not elsewhere classified: Secondary | ICD-10-CM | POA: Diagnosis not present

## 2012-03-21 DIAGNOSIS — G40909 Epilepsy, unspecified, not intractable, without status epilepticus: Secondary | ICD-10-CM | POA: Diagnosis not present

## 2012-03-21 DIAGNOSIS — M159 Polyosteoarthritis, unspecified: Secondary | ICD-10-CM | POA: Diagnosis not present

## 2012-03-25 DIAGNOSIS — Z96649 Presence of unspecified artificial hip joint: Secondary | ICD-10-CM | POA: Diagnosis not present

## 2012-03-25 DIAGNOSIS — H913 Deaf nonspeaking, not elsewhere classified: Secondary | ICD-10-CM | POA: Diagnosis not present

## 2012-03-25 DIAGNOSIS — M159 Polyosteoarthritis, unspecified: Secondary | ICD-10-CM | POA: Diagnosis not present

## 2012-03-25 DIAGNOSIS — Z471 Aftercare following joint replacement surgery: Secondary | ICD-10-CM | POA: Diagnosis not present

## 2012-03-25 DIAGNOSIS — G40909 Epilepsy, unspecified, not intractable, without status epilepticus: Secondary | ICD-10-CM | POA: Diagnosis not present

## 2012-03-25 DIAGNOSIS — E119 Type 2 diabetes mellitus without complications: Secondary | ICD-10-CM | POA: Diagnosis not present

## 2012-03-26 DIAGNOSIS — Z96649 Presence of unspecified artificial hip joint: Secondary | ICD-10-CM | POA: Diagnosis not present

## 2012-03-26 DIAGNOSIS — M159 Polyosteoarthritis, unspecified: Secondary | ICD-10-CM | POA: Diagnosis not present

## 2012-03-26 DIAGNOSIS — Z471 Aftercare following joint replacement surgery: Secondary | ICD-10-CM | POA: Diagnosis not present

## 2012-03-26 DIAGNOSIS — E119 Type 2 diabetes mellitus without complications: Secondary | ICD-10-CM | POA: Diagnosis not present

## 2012-03-26 DIAGNOSIS — H913 Deaf nonspeaking, not elsewhere classified: Secondary | ICD-10-CM | POA: Diagnosis not present

## 2012-03-26 DIAGNOSIS — G40909 Epilepsy, unspecified, not intractable, without status epilepticus: Secondary | ICD-10-CM | POA: Diagnosis not present

## 2012-04-02 DIAGNOSIS — G40909 Epilepsy, unspecified, not intractable, without status epilepticus: Secondary | ICD-10-CM | POA: Diagnosis not present

## 2012-04-02 DIAGNOSIS — Z471 Aftercare following joint replacement surgery: Secondary | ICD-10-CM | POA: Diagnosis not present

## 2012-04-02 DIAGNOSIS — H913 Deaf nonspeaking, not elsewhere classified: Secondary | ICD-10-CM | POA: Diagnosis not present

## 2012-04-02 DIAGNOSIS — E119 Type 2 diabetes mellitus without complications: Secondary | ICD-10-CM | POA: Diagnosis not present

## 2012-04-02 DIAGNOSIS — M159 Polyosteoarthritis, unspecified: Secondary | ICD-10-CM | POA: Diagnosis not present

## 2012-04-02 DIAGNOSIS — Z96649 Presence of unspecified artificial hip joint: Secondary | ICD-10-CM | POA: Diagnosis not present

## 2012-04-03 DIAGNOSIS — H913 Deaf nonspeaking, not elsewhere classified: Secondary | ICD-10-CM | POA: Diagnosis not present

## 2012-04-03 DIAGNOSIS — G40909 Epilepsy, unspecified, not intractable, without status epilepticus: Secondary | ICD-10-CM | POA: Diagnosis not present

## 2012-04-03 DIAGNOSIS — M159 Polyosteoarthritis, unspecified: Secondary | ICD-10-CM | POA: Diagnosis not present

## 2012-04-03 DIAGNOSIS — E119 Type 2 diabetes mellitus without complications: Secondary | ICD-10-CM | POA: Diagnosis not present

## 2012-04-03 DIAGNOSIS — Z96649 Presence of unspecified artificial hip joint: Secondary | ICD-10-CM | POA: Diagnosis not present

## 2012-04-03 DIAGNOSIS — Z471 Aftercare following joint replacement surgery: Secondary | ICD-10-CM | POA: Diagnosis not present

## 2012-04-04 DIAGNOSIS — Z471 Aftercare following joint replacement surgery: Secondary | ICD-10-CM | POA: Diagnosis not present

## 2012-04-04 DIAGNOSIS — H913 Deaf nonspeaking, not elsewhere classified: Secondary | ICD-10-CM | POA: Diagnosis not present

## 2012-04-04 DIAGNOSIS — M159 Polyosteoarthritis, unspecified: Secondary | ICD-10-CM | POA: Diagnosis not present

## 2012-04-04 DIAGNOSIS — E119 Type 2 diabetes mellitus without complications: Secondary | ICD-10-CM | POA: Diagnosis not present

## 2012-04-04 DIAGNOSIS — G40909 Epilepsy, unspecified, not intractable, without status epilepticus: Secondary | ICD-10-CM | POA: Diagnosis not present

## 2012-04-04 DIAGNOSIS — Z96649 Presence of unspecified artificial hip joint: Secondary | ICD-10-CM | POA: Diagnosis not present

## 2012-04-05 DIAGNOSIS — M1610 Unilateral primary osteoarthritis, unspecified hip: Secondary | ICD-10-CM | POA: Diagnosis not present

## 2012-04-18 DIAGNOSIS — R42 Dizziness and giddiness: Secondary | ICD-10-CM | POA: Diagnosis not present

## 2012-04-18 DIAGNOSIS — I469 Cardiac arrest, cause unspecified: Secondary | ICD-10-CM | POA: Diagnosis not present

## 2012-04-18 DIAGNOSIS — M1610 Unilateral primary osteoarthritis, unspecified hip: Secondary | ICD-10-CM | POA: Diagnosis not present

## 2012-04-18 DIAGNOSIS — Z0000 Encounter for general adult medical examination without abnormal findings: Secondary | ICD-10-CM | POA: Diagnosis not present

## 2012-04-18 DIAGNOSIS — G40209 Localization-related (focal) (partial) symptomatic epilepsy and epileptic syndromes with complex partial seizures, not intractable, without status epilepticus: Secondary | ICD-10-CM | POA: Diagnosis not present

## 2012-05-14 DIAGNOSIS — Z1231 Encounter for screening mammogram for malignant neoplasm of breast: Secondary | ICD-10-CM | POA: Diagnosis not present

## 2012-06-03 DIAGNOSIS — G40209 Localization-related (focal) (partial) symptomatic epilepsy and epileptic syndromes with complex partial seizures, not intractable, without status epilepticus: Secondary | ICD-10-CM | POA: Diagnosis not present

## 2012-06-14 DIAGNOSIS — M1610 Unilateral primary osteoarthritis, unspecified hip: Secondary | ICD-10-CM | POA: Diagnosis not present

## 2012-06-14 DIAGNOSIS — M25559 Pain in unspecified hip: Secondary | ICD-10-CM | POA: Diagnosis not present

## 2012-08-21 ENCOUNTER — Encounter: Payer: Self-pay | Admitting: Family Medicine

## 2012-08-21 NOTE — Progress Notes (Signed)
Received recent OV note from 06/14/12, op notes for hip replacement surgeries from 2013 and 2 CXR from orthopaedics, Dr. Kyra Leyland. Most recent OV note reports she is doing well and is asymptomatic. Scanned into record.

## 2012-08-26 ENCOUNTER — Encounter: Payer: Medicare Other | Admitting: Family Medicine

## 2012-08-26 ENCOUNTER — Encounter: Payer: Self-pay | Admitting: Family Medicine

## 2012-08-26 ENCOUNTER — Ambulatory Visit (INDEPENDENT_AMBULATORY_CARE_PROVIDER_SITE_OTHER): Payer: Federal, State, Local not specified - PPO | Admitting: Family Medicine

## 2012-08-26 DIAGNOSIS — Z96643 Presence of artificial hip joint, bilateral: Secondary | ICD-10-CM | POA: Insufficient documentation

## 2012-08-26 DIAGNOSIS — H9190 Unspecified hearing loss, unspecified ear: Secondary | ICD-10-CM | POA: Insufficient documentation

## 2012-08-26 DIAGNOSIS — G40909 Epilepsy, unspecified, not intractable, without status epilepticus: Secondary | ICD-10-CM | POA: Diagnosis not present

## 2012-08-26 DIAGNOSIS — R001 Bradycardia, unspecified: Secondary | ICD-10-CM

## 2012-08-26 DIAGNOSIS — G43909 Migraine, unspecified, not intractable, without status migrainosus: Secondary | ICD-10-CM | POA: Insufficient documentation

## 2012-08-26 DIAGNOSIS — I495 Sick sinus syndrome: Secondary | ICD-10-CM | POA: Insufficient documentation

## 2012-08-26 DIAGNOSIS — I498 Other specified cardiac arrhythmias: Secondary | ICD-10-CM

## 2012-08-26 DIAGNOSIS — H9193 Unspecified hearing loss, bilateral: Secondary | ICD-10-CM

## 2012-08-26 DIAGNOSIS — Z96649 Presence of unspecified artificial hip joint: Secondary | ICD-10-CM

## 2012-08-26 HISTORY — DX: Epilepsy, unspecified, not intractable, without status epilepticus: G40.909

## 2012-08-26 NOTE — Patient Instructions (Addendum)
-  We have ordered labs or studies at this visit. It can take up to 1-2 weeks for results and processing. We will contact you with instructions IF your results are abnormal. Normal results will be released to your [facility]. If you have not heard from [place] or can not find your results in [facility] in 2 weeks please contact our office.  -We placed a referral for you as discussed to the NEUROLOGY, CARDIOLOGY and ORTHOPEDICS. It usually takes about 1-2 weeks to process and schedule this referral. If you have not heard from [place] regarding this appointment in 2 weeks please contact our office.   -PLEASE SIGN UP FOR MYCHART TODAY   We recommend the following healthy lifestyle measures: - eat a healthy diet consisting of lots of vegetables, fruits, beans, nuts, seeds, healthy meats such as white chicken and fish and whole grains.  - avoid fried foods, fast food, processed foods, sodas, red meet and other fattening foods.  - get a least 150 minutes of aerobic exercise per week.   Follow up in: early morning appointment in 1 month for CPE with pap

## 2012-08-26 NOTE — Progress Notes (Signed)
error    This encounter was created in error - please disregard.

## 2012-08-26 NOTE — Progress Notes (Signed)
No chief complaint on file.   HPI:  [name] [name] is here to establish care. Patient is deaf and interpreter and mother are present. She recently moved here from [place] after separation from her spouse.  Has the following chronic problems and concerns today:  Patient Active Problem List  Diagnosis  . Deaf  . Epilepsy  . Migraine  . Bradycardia  . H/O bilateral hip replacements   Epilespy/migraines: -followed by Neurology prior to move and wants to establish with neuro here -recent neuro notes from prior urologist reviewed and scanned into chart -per notes last seen by Dr. Barbaraann Rondo 06/03/12 for epilespy with impairment of consciousness, migraines and presyncope syndrome. 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 Deffered surgical options. Advised to chart seizures and follow up in 3 months. Per notes has sinus brady symptomatic - followed by cards with pacemeaker. -no recent seizures  Bradycardia s/p pacemaker: -followed by cardiology in [place] every 6 motnhs -has been feeling fine, no CP, SOB, DOE, palpitations  OA: -she has had two hip replacement recently, doing well -reports need ortho doc here for follow up - told to follow up every 3 months  Health Maintenance: -had mammo 2 years ago -no pap smear in a long time - more then 10 years -needs health maintenance  ROS: See pertinent positives and negatives per HPI.  Past Medical History  Diagnosis Date  . Chicken pox   . Conductive hearing loss, childhood onset   . Pacemaker   . Seizure   . Deafness     from medication a a child for chicken pox    Family History  Problem Relation Age of Onset  . Diabetes Father   . Hypertension Father   . Hypertension Mother     History   Social History  . Marital Status:  Divorced    Spouse Name: N/A    Number of Children: N/A  . Years of Education: N/A   Social History Main Topics  . Smoking status: Never Smoker   . Smokeless tobacco: None  . Alcohol Use: No  . Drug Use: None  . Sexually Active: None   Other Topics Concern  . None   Social History Narrative   Work or School: on disability for the deafness, seizure and carpal tunnel      Home Situation: lives with mother and father currently - since 06/2012      Spiritual Beliefs: Christian      Lifestyle: walks for 15-20 minutes a few times per week, well balanced diet             Current outpatient prescriptions:cholecalciferol (VITAMIN D) 400 UNITS TABS, Take 2,000 Units by mouth 2 (two) times daily., Disp: , Rfl: ;  fish oil-omega-3 fatty acids 1000 MG capsule, Take 2 g by mouth 2 (two) times daily., Disp: , Rfl: ;  lamoTRIgine (LAMICTAL) 100 MG tablet, Take 100 mg by mouth. 2 in the morning, 1 1/2 at noon and 2 at night, Disp: , Rfl: ;  magnesium 30 MG tablet, Take 30 mg by mouth 3 (three) times daily., Disp: , Rfl:  Multiple Vitamins-Minerals (ULTRA MEGA PO), Take by mouth daily., Disp: , Rfl: ;  Oxcarbazepine (TRILEPTAL) 300 MG tablet, Take 300 mg by mouth. One in the morning and one at night., Disp: , Rfl: ;  vitamin C (  ASCORBIC ACID) 500 MG tablet, Take 500 mg by mouth 2 (two) times daily., Disp: , Rfl:   EXAM:  There were no vitals filed for this visit.  There is no height or weight on file to calculate BMI.  GENERAL: vitals reviewed and listed above, alert, oriented, appears well hydrated and in no acute distress  HEENT: atraumatic, conjunttiva clear, no obvious abnormalities on inspection of external nose and ears  NECK: no obvious masses on inspection  LUNGS: clear to auscultation bilaterally, no wheezes, rales or rhonchi, good air movement  CV: HRRR, no peripheral edema  MS: moves all extremities without noticeable abnormality  PSYCH: pleasant and cooperative, no obvious  depression or anxiety  ASSESSMENT AND PLAN:  Discussed the following assessment and plan:  Deaf, bilateral  Epilepsy - Plan: Ambulatory referral to Neurology  Migraine - Plan: Ambulatory referral to Neurology  Bradycardia - Plan: Ambulatory referral to Cardiology  H/O bilateral hip replacements - Plan: Ambulatory referral to Orthopedic Surgery  -We reviewed the PMH, PSH, FH, SH, Meds and Allergies. -Reviewed recent neuro records -referral per her request placed -needs health maintenance exam and will follow up for this and labs -will have nurse get records from cards and ortho -greater the 45 minutes spent face to face with this patient -follow up 1 month for CPE with labs  -Patient advised to return or notify a doctor immediately if symptoms worsen or persist or new concerns arise.  Patient Instructions  -We have ordered labs or studies at this visit. It can take up to 1-2 weeks for results and processing. We will contact you with instructions IF your results are abnormal. Normal results will be released to your [facility]. If you have not heard from [place] or can not find your results in [facility] in 2 weeks please contact our office.  -We placed a referral for you as discussed to the NEUROLOGY, CARDIOLOGY and ORTHOPEDICS. It usually takes about 1-2 weeks to process and schedule this referral. If you have not heard from [place] regarding this appointment in 2 weeks please contact our office.   -PLEASE SIGN UP FOR MYCHART TODAY   We recommend the following healthy lifestyle measures: - eat a healthy diet consisting of lots of vegetables, fruits, beans, nuts, seeds, healthy meats such as white chicken and fish and whole grains.  - avoid fried foods, fast food, processed foods, sodas, red meet and other fattening foods.  - get a least 150 minutes of aerobic exercise per week.   Follow up in: early morning appointment in 1 month for CPE with pap      KIM, HANNAH R.

## 2012-09-09 ENCOUNTER — Encounter: Payer: Self-pay | Admitting: Family Medicine

## 2012-09-09 DIAGNOSIS — M25559 Pain in unspecified hip: Secondary | ICD-10-CM | POA: Diagnosis not present

## 2012-09-09 DIAGNOSIS — M169 Osteoarthritis of hip, unspecified: Secondary | ICD-10-CM | POA: Diagnosis not present

## 2012-09-09 DIAGNOSIS — M1610 Unilateral primary osteoarthritis, unspecified hip: Secondary | ICD-10-CM | POA: Diagnosis not present

## 2012-09-09 NOTE — Progress Notes (Signed)
Received some OV notes from [facility]. Hx of asystole during tilt table test s/p pacemaker placement in 2012. Most recent OV notes scanned in. Pt establishing with cardiology here.

## 2012-09-11 ENCOUNTER — Encounter: Payer: Self-pay | Admitting: Neurology

## 2012-09-11 ENCOUNTER — Ambulatory Visit (INDEPENDENT_AMBULATORY_CARE_PROVIDER_SITE_OTHER): Payer: Federal, State, Local not specified - PPO | Admitting: Neurology

## 2012-09-11 VITALS — BP 124/76 | HR 84 | Temp 97.5°F | Resp 12 | Ht 64.0 in | Wt 193.0 lb

## 2012-09-11 DIAGNOSIS — G40209 Localization-related (focal) (partial) symptomatic epilepsy and epileptic syndromes with complex partial seizures, not intractable, without status epilepticus: Secondary | ICD-10-CM | POA: Diagnosis not present

## 2012-09-11 MED ORDER — LAMOTRIGINE ER 200 MG PO TB24: 200.0000 mg | ORAL_TABLET | Freq: Two times a day (BID) | ORAL | Status: DC

## 2012-09-11 MED ORDER — OXCARBAZEPINE 300 MG PO TABS: 300.0000 mg | ORAL_TABLET | Freq: Two times a day (BID) | ORAL | Status: DC

## 2012-09-11 NOTE — Progress Notes (Signed)
[name] [name] is a 57 year old woman with deafness do to chickenpox and 98% hearing loss.  She also has complex partial or temporal lobe epilepsy.  She was previous to treating [facility] for several years. She has recently moved back to this area since going through a separation with her husband.  Apparently was a stressful marriage and her mother thinks that stress will bring on episodes.  She has one type or her eyes will stay her off and she will not fully respond but her color doesn't change and just last what seems like a few seconds. There are even times when she has one in her mother noticed it but the patient was not aware of it and my not known about it if her mother had not told her.  Therefore to her difficult to tell how often she actually has them, but she thinks it could be up to once a month or maybe a little less.  In addition she said some spells where she goes out and she falls. Sometimes she holds onto something and other times she will fall such as in the shower. This is happened twice in the last 6 months. She also has a pacemaker placed for the finding of asystole during a tilt table test.  She didn't take any medicines when she was pregnant more than 20 years ago. After that she thinks she was very low controlled on the medical and at first it was name brand only. In [place], she was taking generic and she was having seizure episodes and she was increased and she has a side effect of 500 mg. Then Trileptal is added to Lamictal 400 mg and she had side effects at 900 of Trileptal.  Now she is taking Lamictal 200 in the morning and 200 at night as well as Trileptal 300 in the morning and 300 at night.  She doesn't like the way the Trileptal makes her feel that she is tolerating it reasonably well for now. She is completely out of medications and she need some call in for tonight.  Review of symptoms is positive for occasional headaches, occasional indigestion and heartburn and  occasional sitting trouble. Remainder of the review of symptoms is negative  Past Medical History  Diagnosis Date  . Chicken pox   . Conductive hearing loss, childhood onset   . Pacemaker     2012  . Seizure   . Deafness     from medication a a child for chicken pox    Current Outpatient Prescriptions on File Prior to Visit  Medication Sig Dispense Refill  . cholecalciferol (VITAMIN D) 400 UNITS TABS Take 2,000 Units by mouth 2 (two) times daily.      . fish oil-omega-3 fatty acids 1000 MG capsule Take 2 g by mouth 2 (two) times daily.      Marland Kitchen lamoTRIgine (LAMICTAL) 100 MG tablet Take 100 mg by mouth. 2 in the morning,and 2 at night.      . magnesium 30 MG tablet Take 30 mg by mouth 3 (three) times daily.      . Multiple Vitamins-Minerals (ULTRA MEGA PO) Take by mouth daily. Takes 2 tabs daily      . Oxcarbazepine (TRILEPTAL) 300 MG tablet Take 300 mg by mouth. One in the morning and one at night.      . vitamin C (ASCORBIC ACID) 500 MG tablet Take 500 mg by mouth 2 (two) times daily.       No current facility-administered medications  on file prior to visit.   Review of patient's allergies indicates no known allergies.  History   Social History  . Marital Status: Divorced    Spouse Name: N/A    Number of Children: N/A  . Years of Education: N/A   Occupational History  . Not on file.   Social History Main Topics  . Smoking status: Never Smoker   . Smokeless tobacco: Never Used  . Alcohol Use: No     Comment: none  . Drug Use: No  . Sexually Active: Not on file   Other Topics Concern  . Not on file   Social History Narrative   Work or School: on disability for the deafness, seizure and carpal tunnel      Home Situation: lives with mother and father currently - since 06/2012      Spiritual Beliefs: Christian      Lifestyle: walks for 15-20 minutes a few times per week, well balanced diet             Family History  Problem Relation Age of Onset  . Diabetes  Father   . Hypertension Father   . Hypertension Mother     BP 124/76  Pulse 84  Temp(Src) 97.5 F (36.4 C)  Resp 12  Ht 5\' 4"  (1.626 m)  Wt 193 lb (87.544 kg)  BMI 33.11 kg/m2  LMP 08/29/2005   Depth woman speaking through translator with sign language.  No carotid bruits detected.  Cranial nerve II through XII are within normal limitsexcept for the hearing loss.  Motor strength is 5 over 5 throughout all limbs.  No atrophy, abnormal tone or tremors. Reflexes are 2+ and symmetric in the upper and lower extremities Sensory exam is intact. Coordination is intact for fine movements and rapid alternating movements in all limbs Gait and station are normal. She has trouble hopping on 1 foot due to her history of hip surgery.  Impression: 1. 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  She was well controlled at one point. She was nose well controlled in the last few years. The reason for this is not 100% non-, but it could be due to increased stress and that could be due to generic medication.  Plan: We will try name Brand Lamictal 200 mg XR twice a day We will continue the Trileptal 300 mg twice a day for now. Return in 8 weeks for followup.

## 2012-09-11 NOTE — Patient Instructions (Addendum)
Follow up with Dr. Smiley Houseman in 8 weeks please.  Refills have been sent to your pharmacy.

## 2012-09-18 ENCOUNTER — Encounter: Payer: Self-pay | Admitting: Cardiology

## 2012-09-18 ENCOUNTER — Encounter: Payer: Self-pay | Admitting: *Deleted

## 2012-09-19 ENCOUNTER — Ambulatory Visit (INDEPENDENT_AMBULATORY_CARE_PROVIDER_SITE_OTHER): Payer: Federal, State, Local not specified - PPO | Admitting: Cardiovascular Disease

## 2012-09-19 ENCOUNTER — Encounter: Payer: Self-pay | Admitting: Cardiovascular Disease

## 2012-09-19 VITALS — BP 140/98 | HR 81 | Ht 64.0 in | Wt 192.0 lb

## 2012-09-19 DIAGNOSIS — I498 Other specified cardiac arrhythmias: Secondary | ICD-10-CM | POA: Diagnosis not present

## 2012-09-19 DIAGNOSIS — Z950 Presence of cardiac pacemaker: Secondary | ICD-10-CM | POA: Diagnosis not present

## 2012-09-19 DIAGNOSIS — R001 Bradycardia, unspecified: Secondary | ICD-10-CM

## 2012-09-19 NOTE — Assessment & Plan Note (Signed)
Does not appear to be pacer dependant.  QRS morphology ok.  Will have her f/u with pacer clinic next week and EP next available appt

## 2012-09-19 NOTE — Patient Instructions (Addendum)
You have been referred to PACER CLINIC SOONEST APP AVAILABLE PLEASE   You have been referred to ESTABLISH WITH EP CARDIOLOGIST.   Your physician recommends that you continue on your current medications as directed. Please refer to the Current Medication list given to you today.  Your physician recommends that you schedule a follow-up appointment in: PRN

## 2012-09-19 NOTE — Progress Notes (Signed)
Patient ID: [name] [name], female   DOB: [birth year], 57 y.o.   MRN: [id number] 57 yo deaf female.  Just moved from [place]. Separated from husband. 2 years ago was having a test for seizures and "heart stopped"  Had medtronic Adapta R pacer placed. No issues.  No other issues wit heart. No CAD, valve disease or CHF.  She has no current symptoms.  De[age] since age 26.5 from medication given for ? Chicken pox.  She is living with her mother. Sign language interpretor helped with todays interview.   ROS: Denies fever, malais, weight loss, blurry vision, decreased visual acuity, cough, sputum, SOB, hemoptysis, pleuritic pain, palpitaitons, heartburn, abdominal pain, melena, lower extremity edema, claudication, or rash.  All other systems reviewed and negative   General: Affect appropriate Healthy:  appears stated age HEENT: normal Neck supple with no adenopathy JVP normal no bruits no thyromegaly Lungs clear with no wheezing and good diaphragmatic motion Heart:  S1/S2 no murmur,rub, gallop or click PMI normal Abdomen: benighn, BS positve, no tenderness, no AAA no bruit.  No HSM or HJR Distal pulses intact with no bruits No edema Neuro non-focal Skin warm and dry No muscular weakness Pacer under left clavicle  Medications Current Outpatient Prescriptions  Medication Sig Dispense Refill  . cholecalciferol (VITAMIN D) 400 UNITS TABS Take 2,000 Units by mouth 2 (two) times daily.      . fish oil-omega-3 fatty acids 1000 MG capsule Take 2 g by mouth 2 (two) times daily.      . LamoTRIgine 200 MG TB24 Take 1 tablet (200 mg total) by mouth 2 (two) times daily.  60 tablet  11  . magnesium 30 MG tablet Take 30 mg by mouth 3 (three) times daily.      . Multiple Vitamins-Minerals (ULTRA MEGA PO) Take by mouth daily. Takes 2 tabs daily      . Oxcarbazepine (TRILEPTAL) 300 MG tablet Take 1 tablet (300 mg total) by mouth 2 (two) times daily. One in the morning and one at night.  60 tablet  11  . vitamin C  (ASCORBIC ACID) 500 MG tablet Take 500 mg by mouth 2 (two) times daily.       No current facility-administered medications for this visit.    Allergies Review of patient's allergies indicates no known allergies.  Family History: Family History  Problem Relation Age of Onset  . Diabetes Father   . Hypertension Father   . Hypertension Mother     Social History: History   Social History  . Marital Status: Divorced    Spouse Name: N/A    Number of Children: N/A  . Years of Education: N/A   Occupational History  . Not on file.   Social History Main Topics  . Smoking status: Never Smoker   . Smokeless tobacco: Never Used  . Alcohol Use: No     Comment: none  . Drug Use: No  . Sexually Active: Not on file   Other Topics Concern  . Not on file   Social History Narrative   Work or School: on disability for the deafness, seizure and carpal tunnel      Home Situation: lives with mother and father currently - since 06/2012      Spiritual Beliefs: Christian      Lifestyle: walks for 15-20 minutes a few times per week, well balanced diet             Electrocardiogram:  A pacing rate 81 voltage for LVH  in limb leads   Assessment and Plan

## 2012-09-24 ENCOUNTER — Encounter: Payer: Self-pay | Admitting: Family Medicine

## 2012-09-24 ENCOUNTER — Ambulatory Visit (INDEPENDENT_AMBULATORY_CARE_PROVIDER_SITE_OTHER): Payer: Medicare Other | Admitting: Family Medicine

## 2012-09-24 ENCOUNTER — Other Ambulatory Visit (HOSPITAL_COMMUNITY)
Admission: RE | Admit: 2012-09-24 | Discharge: 2012-09-24 | Disposition: A | Discharge status: Home / Self Care | Payer: Federal, State, Local not specified - PPO | Source: Ambulatory Visit | Attending: Family Medicine | Admitting: Family Medicine

## 2012-09-24 VITALS — BP 112/82 | Temp 97.7°F | Wt 191.0 lb

## 2012-09-24 DIAGNOSIS — Z01419 Encounter for gynecological examination (general) (routine) without abnormal findings: Secondary | ICD-10-CM | POA: Insufficient documentation

## 2012-09-24 DIAGNOSIS — R87810 Cervical high risk human papillomavirus (HPV) DNA test positive: Secondary | ICD-10-CM | POA: Insufficient documentation

## 2012-09-24 DIAGNOSIS — R7309 Other abnormal glucose: Secondary | ICD-10-CM

## 2012-09-24 DIAGNOSIS — IMO0002 Reserved for concepts with insufficient information to code with codable children: Secondary | ICD-10-CM

## 2012-09-24 DIAGNOSIS — Z0000 Encounter for general adult medical examination without abnormal findings: Secondary | ICD-10-CM | POA: Diagnosis not present

## 2012-09-24 DIAGNOSIS — B977 Papillomavirus as the cause of diseases classified elsewhere: Secondary | ICD-10-CM

## 2012-09-24 DIAGNOSIS — R87619 Unspecified abnormal cytological findings in specimens from cervix uteri: Secondary | ICD-10-CM

## 2012-09-24 DIAGNOSIS — Z1151 Encounter for screening for human papillomavirus (HPV): Secondary | ICD-10-CM | POA: Insufficient documentation

## 2012-09-24 DIAGNOSIS — E785 Hyperlipidemia, unspecified: Secondary | ICD-10-CM

## 2012-09-24 DIAGNOSIS — R739 Hyperglycemia, unspecified: Secondary | ICD-10-CM

## 2012-09-24 DIAGNOSIS — R6889 Other general symptoms and signs: Secondary | ICD-10-CM

## 2012-09-24 LAB — BASIC METABOLIC PANEL
CO2: 26 mEq/L (ref 19–32)
Calcium: 9.6 mg/dL (ref 8.4–10.5)
GFR: 81.06 mL/min (ref >60.00)
Sodium: 136 mEq/L (ref 135–145)

## 2012-09-24 LAB — HEMOGLOBIN A1C: Hgb A1c MFr Bld: 5.3 % (ref 4.6–6.5)

## 2012-09-24 LAB — LIPID PANEL: VLDL: 8.4 mg/dL (ref 0.0–40.0)

## 2012-09-24 NOTE — Patient Instructions (Signed)
-  We have ordered labs or studies at this visit. It can take up to 1-2 weeks for results and processing. We will contact you with instructions IF your results are abnormal. Normal results will be released to your [facility]. If you have not heard from [place] or can not find your results in [facility] in 2 weeks please contact our office.  -PLEASE SIGN UP FOR MYCHART TODAY   We recommend the following healthy lifestyle measures: - eat a healthy diet consisting of lots of vegetables, fruits, beans, nuts, seeds, healthy meats such as white chicken and fish and whole grains.  - avoid fried foods, fast food, processed foods, sodas, red meet and other fattening foods.  - get a least 150 minutes of aerobic exercise per week.   Please have your most recent mammogram results forwarded to [place]  Follow up in: 6 months

## 2012-09-24 NOTE — Progress Notes (Signed)
Chief Complaint  Patient presents with  . Annual Exam    HPI:  Here for CPE:  -Concerns today: -saw neurologist - notes reviewed, new meds reviewed -accidentally scheduled with cards instead of EP - EP and pacer clinic visits set up  -Diet: variety of foods, balance and well rounded  -Vitamin D and calcium:  She take both of these  -Exercise: walking daily  -Diabetes and Dyslipidemia Screening: not done in a while  -Hx of HTN: no  -Vaccines: UTD  -pap history: can't remember - has been a long time, never had abnormal pap smear  -FDLMP: postmenopausal, no vaginal bleeding  -sexual activity: no new partners  -wants STI testing: no  -FH breast, colon or ovarian ca: see FH -has never had colon cancer screening - wants colonoscopy referral -reports had mammo in Feb 2014 and was normal   -Alcohol, Tobacco, drug use: see social history  Review of Systems - denies: fevers, weight changes, CP, SOB, DOE, changes in bowels, hematochezia, melena, falls, syncope, depression, dysuria, vaginal bleeding, skin rashes  Past Medical History  Diagnosis Date  . Chicken pox   . Conductive hearing loss, childhood onset   . Pacemaker     2012  . Seizure   . Deafness     from medication a a child for chicken pox  . Epilepsy, followed by Dr. Smiley Houseman in neurology 08/26/2012    Family History  Problem Relation Age of Onset  . Diabetes Father   . Hypertension Father   . Hypertension Mother     History   Social History  . Marital Status: Divorced    Spouse Name: N/A    Number of Children: N/A  . Years of Education: N/A   Social History Main Topics  . Smoking status: Never Smoker   . Smokeless tobacco: Never Used  . Alcohol Use: No     Comment: none  . Drug Use: No  . Sexually Active: None   Other Topics Concern  . None   Social History Narrative   Work or School: on disability for the deafness, seizure and carpal tunnel      Home Situation: lives with mother and father  currently - since 06/2012      Spiritual Beliefs: Christian      Lifestyle: walks for 15-20 minutes a few times per week, well balanced diet             Current outpatient prescriptions:cholecalciferol (VITAMIN D) 400 UNITS TABS, Take 2,000 Units by mouth 2 (two) times daily., Disp: , Rfl: ;  fish oil-omega-3 fatty acids 1000 MG capsule, Take 2 g by mouth 2 (two) times daily., Disp: , Rfl: ;  LamoTRIgine 200 MG TB24, Take 1 tablet (200 mg total) by mouth 2 (two) times daily., Disp: 60 tablet, Rfl: 11;  magnesium 30 MG tablet, Take 30 mg by mouth 3 (three) times daily., Disp: , Rfl:  Multiple Vitamins-Minerals (ULTRA MEGA PO), Take by mouth daily. Takes 2 tabs daily, Disp: , Rfl: ;  Oxcarbazepine (TRILEPTAL) 300 MG tablet, Take 1 tablet (300 mg total) by mouth 2 (two) times daily. One in the morning and one at night., Disp: 60 tablet, Rfl: 11;  vitamin C (ASCORBIC ACID) 500 MG tablet, Take 500 mg by mouth 2 (two) times daily., Disp: , Rfl:   EXAM:  Filed Vitals:   09/24/12 1120  BP: 112/82  Temp: 97.7 F (36.5 C)    GENERAL: vitals reviewed and listed below, alert, oriented, appears well  hydrated and in no acute distress  HEENT: head atraumatic, PERRLA, normal appearance of eyes, ears, nose and mouth. moist mucus membranes.  NECK: supple, no masses or lymphadenopathy  LUNGS: clear to auscultation bilaterally, no rales, rhonchi or wheeze  CV: HRRR, no peripheral edema or cyanosis, normal pedal pulses  BREAST: normal appearance - no lesions or discharge, on palpation normal breast tissue without any suspicious masses  ABDOMEN: bowel sounds normal, soft, non tender to palpation, no masses, no rebound or guarding  GU: normal appearance of external genitalia - no lesions or masses, normal vaginal mucosa - no abnormal discharge, normal appearance of cervix - no lesions or abnormal discharge, no masses or tenderness on palpation of uterus and ovaries.  RECTAL: refused  SKIN: no rash or  abnormal lesions  MS: normal gait, moves all extremities normally  NEURO: CN II-XII grossly intact, normal muscle strength and sensation to light touch on extremities  PSYCH: normal affect, pleasant and cooperative  ASSESSMENT AND PLAN:  Discussed the following assessment and plan:  Visit for preventive health examination - Plan: Lipid Panel, Hemoglobin A1c, Basic metabolic panel, Ambulatory referral to Gastroenterology  Hyperlipidemia - Plan: Lipid Panel  Hyperglycemia - Plan: Hemoglobin A1c  -Discussed and advised all [place] preventive services health task force level A and B recommendations for age, sex and risks.  -Advised at least 150 minutes of exercise per week and a healthy diet low in saturated fats and sweets and consisting of fresh fruits and vegetables, lean meats such as fish and white chicken and whole grains.  -labs, studies and vaccines per orders this encounter -referred for colonoscopy -FASTING LABS today   Orders Placed This Encounter  Procedures  . Lipid Panel  . Hemoglobin A1c  . Basic metabolic panel  . Ambulatory referral to Gastroenterology    Referral Priority:  Routine    Referral Type:  Consultation    Referral Reason:  Specialty Services Required    Requested Specialty:  Gastroenterology    Number of Visits Requested:  1    Patient Instructions  -We have ordered labs or studies at this visit. It can take up to 1-2 weeks for results and processing. We will contact you with instructions IF your results are abnormal. Normal results will be released to your [facility]. If you have not heard from [place] or can not find your results in [facility] in 2 weeks please contact our office.  -PLEASE SIGN UP FOR MYCHART TODAY   We recommend the following healthy lifestyle measures: - eat a healthy diet consisting of lots of vegetables, fruits, beans, nuts, seeds, healthy meats such as white chicken and fish and whole grains.  - avoid fried foods, fast food, processed  foods, sodas, red meet and other fattening foods.  - get a least 150 minutes of aerobic exercise per week.   Please have your most recent mammogram results forwarded to [place]  Follow up in: 6 months     Patient advised to return to clinic immediately if symptoms worsen or persist or new concerns.    No Follow-up on file.  [name]

## 2012-09-26 ENCOUNTER — Ambulatory Visit (INDEPENDENT_AMBULATORY_CARE_PROVIDER_SITE_OTHER): Payer: Federal, State, Local not specified - PPO | Admitting: *Deleted

## 2012-09-26 ENCOUNTER — Encounter: Payer: Self-pay | Admitting: Internal Medicine

## 2012-09-26 DIAGNOSIS — I498 Other specified cardiac arrhythmias: Secondary | ICD-10-CM

## 2012-09-26 DIAGNOSIS — R001 Bradycardia, unspecified: Secondary | ICD-10-CM

## 2012-09-26 LAB — PACEMAKER DEVICE OBSERVATION
AL AMPLITUDE: 5.6 mv
AL THRESHOLD: 0.75 V
BAMS-0001: 170 {beats}/min
RV LEAD AMPLITUDE: 31.36 mv
RV LEAD THRESHOLD: 0.5 V
VENTRICULAR PACING PM: 0

## 2012-09-26 NOTE — Progress Notes (Signed)
Pacemaker check in clinic  

## 2012-09-30 NOTE — Progress Notes (Signed)
Quick Note:  Left a message for pt's mother on DPR to return call. ______

## 2012-10-01 NOTE — Progress Notes (Signed)
Quick Note:  Called and spoke with pt's mother on DPR and she is aware. ______

## 2012-10-01 NOTE — Addendum Note (Signed)
Addended by: Azucena Freed on: 10/01/2012 09:07 AM   Modules accepted: Orders

## 2012-10-14 ENCOUNTER — Telehealth: Payer: Self-pay | Admitting: Family Medicine

## 2012-10-14 NOTE — Telephone Encounter (Signed)
PT's mother wanted to speak with you about a referral to a GYN following her abnoraml pap. Please assist.

## 2012-10-14 NOTE — Telephone Encounter (Signed)
Left a message for return call.  

## 2012-10-14 NOTE — Telephone Encounter (Signed)
Gave pt mother details regarding GYN referral and the telephone number to reach the GYN. She stated that she would still like to speak with Alisha.

## 2012-10-14 NOTE — Telephone Encounter (Signed)
Mom calling back. States that Dr Ambrose Mantle has a new fax # at that office: [phone]. GYN office requesting records pertaining to referral issue for review. GYN office also states they have no referral from [place]. Mom is wondering when this will all be done. Please call her.

## 2012-10-14 NOTE — Telephone Encounter (Signed)
Spoke with pts mother and informed her that the referral and records was faxed over on 6/3 and had a confirmation. However i was happy to refax it for her. i called [facility] and confirmed the faxe number before re-faxing referral

## 2012-10-25 ENCOUNTER — Encounter: Payer: Self-pay | Admitting: Internal Medicine

## 2012-10-25 ENCOUNTER — Ambulatory Visit (INDEPENDENT_AMBULATORY_CARE_PROVIDER_SITE_OTHER): Payer: Federal, State, Local not specified - PPO | Admitting: Internal Medicine

## 2012-10-25 VITALS — BP 138/78 | HR 73 | Ht 64.0 in | Wt 187.0 lb

## 2012-10-25 DIAGNOSIS — I498 Other specified cardiac arrhythmias: Secondary | ICD-10-CM

## 2012-10-25 DIAGNOSIS — Z950 Presence of cardiac pacemaker: Secondary | ICD-10-CM | POA: Diagnosis not present

## 2012-10-25 DIAGNOSIS — R001 Bradycardia, unspecified: Secondary | ICD-10-CM

## 2012-10-25 LAB — PACEMAKER DEVICE OBSERVATION
AL AMPLITUDE: 5.6 mv
AL IMPEDENCE PM: 531 Ohm
BATTERY VOLTAGE: 2.78 V
RV LEAD IMPEDENCE PM: 618 Ohm
VENTRICULAR PACING PM: 0

## 2012-10-25 NOTE — Assessment & Plan Note (Signed)
The patient has sinus node dysfunction which is addressed with pacing. Her device paces 54% of the time. This is in the atrium.

## 2012-10-25 NOTE — Patient Instructions (Addendum)
Your physician wants you to follow-up in: 6 months with device clinic. You will receive a reminder letter in the mail two months in advance. If you don't receive a letter, please call our office to schedule the follow-up appointment. Your physician recommends that you continue on your current medications as directed. Please refer to the Current Medication list given to you today.

## 2012-10-25 NOTE — Progress Notes (Signed)
Patient Care Team: Terressa Koyanagi, DO as PCP - General (Family Medicine)   HPI  [name] [name] is a 57 y.o. female Newly here to establish for pacemaker implanted in 2012 in [place] for reasons that I cannot quite make out. She had significant symptoms of weakness which were relieved by pacing. Interrogation of her device demonstrates atrial pacing. We have no other information as to her cardiac function.   Past Medical History  Diagnosis Date  . Chicken pox   . Conductive hearing loss, childhood onset   . Pacemaker     2012  . Seizure   . Deafness     from medication a a child for chicken pox  . Epilepsy, followed by Dr. Smiley Houseman in neurology 08/26/2012    Past Surgical History  Procedure Laterality Date  . Total hip arthroplasty  07/2011    right  . Total hip arthroplasty  03/2012    left  . Pacemaker insertion  07/2010    Current Outpatient Prescriptions  Medication Sig Dispense Refill  . cholecalciferol (VITAMIN D) 400 UNITS TABS Take 2,000 Units by mouth 2 (two) times daily.      . fish oil-omega-3 fatty acids 1000 MG capsule Take 2 g by mouth 2 (two) times daily.      . LamoTRIgine 200 MG TB24 Take 1 tablet (200 mg total) by mouth 2 (two) times daily.  60 tablet  11  . magnesium 30 MG tablet Take 30 mg by mouth 3 (three) times daily.      . Multiple Vitamins-Minerals (ULTRA MEGA PO) Take by mouth daily. Takes 2 tabs daily      . Oxcarbazepine (TRILEPTAL) 300 MG tablet Take 1 tablet (300 mg total) by mouth 2 (two) times daily. One in the morning and one at night.  60 tablet  11  . vitamin C (ASCORBIC ACID) 500 MG tablet Take 500 mg by mouth 2 (two) times daily.       No current facility-administered medications for this visit.    No Known Allergies  Review of Systems negative except from HPI and PMH  Physical Exam BP 138/78  Pulse 73  Ht 5\' 4"  (1.626 m)  Wt 187 lb (84.823 kg)  BMI 32.08 kg/m2 Alert and oriented in no acute distress HENT- normal Eyes- EOMI,  without scleral icterus Skin- warm and dry; without rashes LN-neg Device pocket well healed; without hematoma or erythema Neck- supple without thyromegaly, JVP-flat, carotids brisk and full without bruits Back-without CVAT or kyphosis Lungs-clear to auscultation CV-Regular rate and rhythm, nl S1 and S2, no murmurs gallops or rubs, S4-absent Abd-soft with active bowel sounds; no midline pulsation or hepatomegaly Pulses-intact femoral and distal MKS-without gross deformity Neuro- Ax O, CN3-12 intact, grossly normal motor and sensory function except she is deaf Affect engaging   ECG dated 20 to May 2014 demonstrates atrial pacing Intervals 26/08/39  Assessment and  Plan

## 2012-10-28 ENCOUNTER — Ambulatory Visit (INDEPENDENT_AMBULATORY_CARE_PROVIDER_SITE_OTHER): Payer: Federal, State, Local not specified - PPO | Admitting: Neurology

## 2012-10-28 ENCOUNTER — Encounter: Payer: Self-pay | Admitting: Neurology

## 2012-10-28 VITALS — BP 130/80 | HR 84 | Temp 97.7°F | Resp 20 | Wt 188.0 lb

## 2012-10-28 DIAGNOSIS — G40209 Localization-related (focal) (partial) symptomatic epilepsy and epileptic syndromes with complex partial seizures, not intractable, without status epilepticus: Secondary | ICD-10-CM | POA: Diagnosis not present

## 2012-10-28 MED ORDER — LAMOTRIGINE ER 300 MG PO TB24: 300.0000 | ORAL_TABLET | Freq: Two times a day (BID) | ORAL | Status: DC

## 2012-10-28 NOTE — Patient Instructions (Addendum)
Decrease the trileptal to once every morning.  Increase the lamotrigine to 300mg  twice a day.  Schedule lab appointment for one week to check your lamotrigine level.  Follow up in six weeks with Dr. Everlena Cooper.

## 2012-10-28 NOTE — Progress Notes (Signed)
[name] [name] returns for followup of her seizure disorder. She has convulsive seizures once a week and she also has drop attacks without warning a few times a month.  We switched her from short-acting lamotrigine to 24-hour duration lamotrigine currently at 200 mg twice a day.  She has stopped having any of the convulsive type seizures.  She has had to drop attacks in the past 2 months, most recently in the lobby 20 minutes ago.  This was without any warning and it lasts for less than a minute.  However she has occasions where she fell on her face and herself in the past.  Previous to that was on May 28.  She tolerates the Lamictal very well and she would like to consider getting a monotherapy.  Review of systems is unremarkable.  Past Medical History  Diagnosis Date  . Chicken pox   . Conductive hearing loss, childhood onset   . Pacemaker     2012  . Seizure   . Deafness     from medication a a child for chicken pox  . Epilepsy, followed by Dr. Smiley Houseman in neurology 08/26/2012    Current Outpatient Prescriptions on File Prior to Visit  Medication Sig Dispense Refill  . cholecalciferol (VITAMIN D) 400 UNITS TABS Take 2,000 Units by mouth 2 (two) times daily.      . fish oil-omega-3 fatty acids 1000 MG capsule Take 2 g by mouth 2 (two) times daily.      . LamoTRIgine 200 MG TB24 Take 1 tablet (200 mg total) by mouth 2 (two) times daily.  60 tablet  11  . magnesium 30 MG tablet Take 30 mg by mouth 3 (three) times daily.      . Multiple Vitamins-Minerals (ULTRA MEGA PO) Take by mouth daily. Takes 2 tabs daily      . Oxcarbazepine (TRILEPTAL) 300 MG tablet Take 1 tablet (300 mg total) by mouth 2 (two) times daily. One in the morning and one at night.  60 tablet  11  . vitamin C (ASCORBIC ACID) 500 MG tablet Take 500 mg by mouth 2 (two) times daily.       No current facility-administered medications on file prior to visit.   Review of patient's allergies indicates no known allergies.  History   Social  History  . Marital Status: Divorced    Spouse Name: N/A    Number of Children: N/A  . Years of Education: N/A   Occupational History  . Not on file.   Social History Main Topics  . Smoking status: Never Smoker   . Smokeless tobacco: Never Used  . Alcohol Use: No     Comment: none  . Drug Use: No  . Sexually Active: Not on file   Other Topics Concern  . Not on file   Social History Narrative   Work or School: on disability for the deafness, seizure and carpal tunnel      Home Situation: lives with mother and father currently - since 06/2012      Spiritual Beliefs: Christian      Lifestyle: walks for 15-20 minutes a few times per week, well balanced diet             Family History  Problem Relation Age of Onset  . Diabetes Father   . Hypertension Father   . Hypertension Mother     BP 130/80  Pulse 84  Temp(Src) 97.7 F (36.5 C)  Resp 20  Wt 188 lb (85.276 kg)  BMI 32.25 kg/m2   Alert and oriented x 3.  Memory function appears to be intact.  Concentration and attention are normal for educational level and background.  Speech is fluent and without significant word finding difficulty.  Is aware of current events.  No carotid bruits detected.  Cranial nerve II through XII are within normal limits except for deafness.  This includes normal optic discs and acuity, EOMI, PERLA, facial movement and sensation intact,gag intact,Uvula raises symmetrically and tongue protrudes evenly. Motor strength is 5 over 5 throughout all limbs.  No atrophy, abnormal tone or tremors. Reflexes are 2+ and symmetric in the upper and lower extremities Sensory exam is intact. Coordination is intact for fine movements and rapid alternating movements in all limbs Gait and station are normal.   Impression: Epilepsy with frequent convulsive seizures not previously controlled on 2 drug regimen.  Currently doing better with the 24-hour time release lamotrigine.  However, she did have to drop  attacks in the past 2 months without much of a warning if any.  Plan: 1. Increase limit to 300 ER b.i.d. As tolerated 2. Limit level in one week. 3. Decrease Trileptal to 300 mg once a day in the morning. 4. Return in 6 weeks for followup.

## 2012-11-04 ENCOUNTER — Other Ambulatory Visit: Payer: Federal, State, Local not specified - PPO

## 2012-11-04 ENCOUNTER — Other Ambulatory Visit: Payer: Self-pay

## 2012-11-04 DIAGNOSIS — R569 Unspecified convulsions: Secondary | ICD-10-CM

## 2012-11-15 DIAGNOSIS — R87610 Atypical squamous cells of undetermined significance on cytologic smear of cervix (ASC-US): Secondary | ICD-10-CM | POA: Diagnosis not present

## 2012-11-15 DIAGNOSIS — Z3202 Encounter for pregnancy test, result negative: Secondary | ICD-10-CM | POA: Diagnosis not present

## 2012-11-15 DIAGNOSIS — R87810 Cervical high risk human papillomavirus (HPV) DNA test positive: Secondary | ICD-10-CM | POA: Diagnosis not present

## 2012-11-20 ENCOUNTER — Encounter: Payer: Self-pay | Admitting: Gastroenterology

## 2012-11-22 DIAGNOSIS — N879 Dysplasia of cervix uteri, unspecified: Secondary | ICD-10-CM | POA: Diagnosis not present

## 2012-12-09 ENCOUNTER — Ambulatory Visit (INDEPENDENT_AMBULATORY_CARE_PROVIDER_SITE_OTHER): Payer: Federal, State, Local not specified - PPO | Admitting: Neurology

## 2012-12-09 ENCOUNTER — Encounter: Payer: Self-pay | Admitting: Neurology

## 2012-12-09 VITALS — BP 136/80 | HR 80 | Temp 97.4°F | Wt 188.0 lb

## 2012-12-09 DIAGNOSIS — G40209 Localization-related (focal) (partial) symptomatic epilepsy and epileptic syndromes with complex partial seizures, not intractable, without status epilepticus: Secondary | ICD-10-CM

## 2012-12-09 DIAGNOSIS — R569 Unspecified convulsions: Secondary | ICD-10-CM

## 2012-12-09 MED ORDER — OXCARBAZEPINE 300 MG PO TABS: 300.0000 mg | ORAL_TABLET | Freq: Every day | ORAL | Status: DC

## 2012-12-09 NOTE — Progress Notes (Signed)
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  She is accompanied by her mother and interpreter.  Records and images personally reviewed where available.  She was previously seen by Dr. Murriel Hopper, who has since left the neurology practice.    Two types: 1.  Staring spells, not fully respond, sometimes aware, tenses up but no convulsions, lasts seconds to a minute.  Occurred 3 times per month. 2. Drop attacks, loses conscious and falls.  Sometimes able to hold on to something.  Several times a month over last couple of months.  Current medication:  Lamictal XR 300mg  BID.  Trileptal 300mg  daily. She had side effects to Trileptal at 900mg .    Prior studies: 1.  EEG: focal slowing and frequent spike and wave activity in left anterior temporal lobe. 2. MRI Brain (04/13/97): focal area of encephalomalacia in posterior left temporal lobe. 3. MRI Brain (10/19/08): possible left mesial temporal sclerosis and lesion in left posterior temporal lobe.  Labs (11/04/12): lamotrigine 14  Last visit with Dr. Smiley Houseman, Lamictal was increased from 200mg  BID to 300mg  BID.  She has not had any spells over the past month.  She does not drive.  PAST MEDICAL HISTORY: Past Medical History  Diagnosis Date  . Chicken pox   . Conductive hearing loss, childhood onset   . Pacemaker     2012  . Seizure   . Deafness     from medication a a child for chicken pox  . Epilepsy, followed by Dr. Smiley Houseman in neurology 08/26/2012    MEDICATIONS: Current Outpatient Prescriptions on File Prior to Visit  Medication Sig Dispense Refill  . cholecalciferol (VITAMIN D) 400 UNITS TABS Take 2,000 Units by mouth 2 (two) times daily.      . fish oil-omega-3 fatty  acids 1000 MG capsule Take 2 g by mouth 2 (two) times daily.      . LamoTRIgine 300 MG TB24 Take 300 tablets (90,000 mg total) by mouth 2 (two) times daily.  60 tablet  11  . magnesium 30 MG tablet Take 30 mg by mouth 3 (three) times daily.      . Multiple Vitamins-Minerals (ULTRA MEGA PO) Take by mouth daily. Takes 2 tabs daily      . Oxcarbazepine (TRILEPTAL) 300 MG tablet Take 1 tablet (300 mg total) by mouth 2 (two) times daily. One in the morning and one at night.  60 tablet  11  . vitamin C (ASCORBIC ACID) 500 MG tablet Take 500 mg by mouth 2 (two) times daily.       No current facility-administered medications on file prior to visit.    ALLERGIES: No Known Allergies  FAMILY HISTORY: Family History  Problem Relation Age of Onset  . Diabetes Father   . Hypertension Father   . Hypertension Mother     SOCIAL HISTORY: History   Social History  . Marital Status: Divorced    Spouse Name: N/A    Number of Children: N/A  . Years of Education: N/A   Occupational History  . Not on file.   Social History Main Topics  . Smoking status: Never Smoker   . Smokeless tobacco: Never Used  . Alcohol Use: No     Comment: none  .  Drug Use: No  . Sexually Active: Not on file   Other Topics Concern  . Not on file   Social History Narrative   Work or School: on disability for the deafness, seizure and carpal tunnel      Home Situation: lives with mother and father currently - since 06/2012      Spiritual Beliefs: Christian      Lifestyle: walks for 15-20 minutes a few times per week, well balanced diet             PHYSICAL EXAM: Filed Vitals:   12/09/12 1525  BP: 136/80  Pulse: 80  Temp: 97.4 F (36.3 C)   General: No acute distress Head:  Normocephalic/atraumatic Neck: supple, no paraspinal tenderness, full range of motion Back: No paraspinal tenderness Neurological Exam: alert and oriented to person, place, and time, unable to speak but language intact.  CN II-XII  intact, bulk and tone normal, muscle strength 5/5 throughout, sensation to light touch, temperature and vibration intact, deep tendon reflexes 2+ throughout, finger to nose and heel to shin testing intact, gait normal.  IMPRESSION & PLAN: Left temporal lobe epilepsy, much better controlled since increase in lamotrigine.  She asked today about discontinuing the Trileptal.  Since this is the best controlled she has been, I wouldn't make any changes. 1.  Continue lamotrigine ER 300mg  BID 2.  Continue oxcarbazepine 300mg  daily 3.  Check oxcarbazepine level 4.  Follow up in 3 months.  Shon Millet, DO  CC:  Kriste Basque, DO

## 2012-12-09 NOTE — Patient Instructions (Addendum)
1.  Continue lamotrigine ER 300mg  twice daily 2.  Continue oxcarbazepine 300mg  daily. 3.  Follow up 3 months.  Come in the morning BEFORE you take your Oxcarbazepine to have your blood drawn.

## 2012-12-10 ENCOUNTER — Other Ambulatory Visit: Payer: Federal, State, Local not specified - PPO

## 2012-12-10 ENCOUNTER — Other Ambulatory Visit: Payer: Self-pay | Admitting: Neurology

## 2012-12-10 DIAGNOSIS — R569 Unspecified convulsions: Secondary | ICD-10-CM

## 2012-12-10 DIAGNOSIS — G40209 Localization-related (focal) (partial) symptomatic epilepsy and epileptic syndromes with complex partial seizures, not intractable, without status epilepticus: Secondary | ICD-10-CM

## 2012-12-10 MED ORDER — OXCARBAZEPINE 300 MG PO TABS: ORAL_TABLET | ORAL | Status: DC

## 2012-12-14 LAB — 10-HYDROXYCARBAZEPINE: Triliptal/MTB(Oxcarbazepin): 3 ug/mL — ABNORMAL LOW (ref 8.0–35.0)

## 2013-03-06 ENCOUNTER — Other Ambulatory Visit: Payer: Self-pay

## 2013-03-14 ENCOUNTER — Encounter: Payer: Self-pay | Admitting: Neurology

## 2013-03-14 ENCOUNTER — Ambulatory Visit (INDEPENDENT_AMBULATORY_CARE_PROVIDER_SITE_OTHER): Payer: Federal, State, Local not specified - PPO | Admitting: Neurology

## 2013-03-14 VITALS — BP 138/86 | HR 84 | Temp 97.4°F | Ht 64.0 in | Wt 183.0 lb

## 2013-03-14 DIAGNOSIS — G40209 Localization-related (focal) (partial) symptomatic epilepsy and epileptic syndromes with complex partial seizures, not intractable, without status epilepticus: Secondary | ICD-10-CM | POA: Diagnosis not present

## 2013-03-14 MED ORDER — LAMOTRIGINE ER 300 MG PO TB24: 300.0000 mg | ORAL_TABLET | Freq: Two times a day (BID) | ORAL | Status: DC

## 2013-03-14 MED ORDER — OXCARBAZEPINE 300 MG PO TABS: ORAL_TABLET | ORAL | Status: DC

## 2013-03-14 NOTE — Progress Notes (Signed)
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  She is accompanied by her mother and interpreter. .  Records and images were personally reviewed where available.    Two types: 1.  Staring spells, not fully respond, sometimes aware, tenses up but no convulsions, lasts seconds to a minute.  Only happened once in last 3 months since last visit (in September) 2.  Drop attacks, loses conscious and falls.  Sometimes able to hold on to something.  No episodes in last 3 months since last visit.  She reports an isolated incident that occurred about 6 weeks ago.  She was having vivid dreams.  When she woke up, she felt funny, like her "brain was moving around inside her head."  It was an odd and vague feeling that she couldn't explain. f She noted a dull headache.  She felt scared.  She never had it before.  It lasted about 2 to 3 hours and resolved after talking about it with her mother.  She had been under a lot of stress lately due to a divorce.  She is doing much better now.  She has not had any other similar episodes.  Current medication:  Lamotrigine ER 300mg  BID.  Trileptal 300mg  daily. She had side effects to Trileptal at 900mg .    Prior studies:  EEG: focal slowing and frequent spike and wave activity in left anterior temporal lobe. MRI Brain (04/13/97): focal area of encephalomalacia in posterior left temporal lobe. MRI Brain (10/19/08): possible left mesial temporal sclerosis and lesion in left posterior temporal lobe.   Labs (11/04/12): lamotrigine 14 Labs (12/10/12): 10-hydroxycarbazepine 3  She does not drive.Marland Kitchen  PAST MEDICAL HISTORY: Past Medical History  Diagnosis Date  . Chicken pox   . Conductive hearing loss,  childhood onset   . Pacemaker     2012  . Seizure   . Deafness     from medication a a child for chicken pox  . Epilepsy, followed by Dr. Smiley Houseman in neurology 08/26/2012    MEDICATIONS: Current Outpatient Prescriptions on File Prior to Visit  Medication Sig Dispense Refill  . cholecalciferol (VITAMIN D) 400 UNITS TABS Take 2,000 Units by mouth 2 (two) times daily.      . fish oil-omega-3 fatty acids 1000 MG capsule Take 2 g by mouth 2 (two) times daily.      . magnesium 30 MG tablet Take 30 mg by mouth 3 (three) times daily.      . Multiple Vitamins-Minerals (ULTRA MEGA PO) Take by mouth daily. Takes 2 tabs daily      . vitamin C (ASCORBIC ACID) 500 MG tablet Take 500 mg by mouth 2 (two) times daily.       No current facility-administered medications on file prior to visit.    ALLERGIES: No Known Allergies  FAMILY HISTORY: Family History  Problem Relation Age of Onset  . Diabetes Father   . Hypertension Father   . Hypertension Mother     SOCIAL HISTORY: History   Social History  . Marital Status: Divorced    Spouse Name: N/A    Number of Children: N/A  . Years of Education: N/A   Occupational History  . Not on file.   Social History Main Topics  .  Smoking status: Never Smoker   . Smokeless tobacco: Never Used  . Alcohol Use: No     Comment: none  . Drug Use: No  . Sexual Activity: Not on file   Other Topics Concern  . Not on file   Social History Narrative   Work or School: on disability for the deafness, seizure and carpal tunnel      Home Situation: lives with mother and father currently - since 06/2012      Spiritual Beliefs: Christian      Lifestyle: walks for 15-20 minutes a few times per week, well balanced diet             REVIEW OF SYSTEMS: Constitutional: No fevers, chills, or sweats, no generalized fatigue, change in appetite Eyes: No visual changes, double vision, eye pain Ear, nose and throat: No hearing loss, ear pain, nasal congestion,  sore throat Cardiovascular: No chest pain, palpitations Respiratory:  No shortness of breath at rest or with exertion, wheezes GastrointestinaI: No nausea, vomiting, diarrhea, abdominal pain, fecal incontinence Genitourinary:  No dysuria, urinary retention or frequency Musculoskeletal:  No neck pain, back pain Integumentary: No rash, pruritus, skin lesions Neurological: as above Psychiatric: No depression, insomnia, anxiety Endocrine: No palpitations, fatigue, diaphoresis, mood swings, change in appetite, change in weight, increased thirst Hematologic/Lymphatic:  No anemia, purpura, petechiae. Allergic/Immunologic: no itchy/runny eyes, nasal congestion, recent allergic reactions, rashes  PHYSICAL EXAM: Filed Vitals:   03/14/13 1517  BP: 138/86  Pulse: 84  Temp: 97.4 F (36.3 C)   General: No acute distress Head:  Normocephalic/atraumatic Neck: supple, no paraspinal tenderness, full range of motion Heart:  Regular rate and rhythm Lungs:  Clear to auscultation bilaterally Back: No paraspinal tenderness Neurological Exam: alert and oriented to person, place, and time. Speech fluent and not dysarthric, language intact.  CN II-XII intact. Fundoscopic exam unremarkable, no papilledema.  Bulk and tone normal, muscle strength 5/5 throughout.  Sensation to light touch, temperature and vibration intact.  Deep tendon reflexes 2+ throughout.  Finger to nose testing intact.  Gait normal stance, Romberg negative.  IMPRESSION: Left temporal lobe epilepsy, stable. Not sure what to make of that isolated incident.  May have been related to stress at that time.  PLAN: 1.  Continue oxcarbazepine 300mg  qd and lamotrigine ER 300mg  BID 2.  Follow up in 6 months or as needed.  Shon Millet, DO  CC:  Kriste Basque, DO

## 2013-03-17 DIAGNOSIS — M25559 Pain in unspecified hip: Secondary | ICD-10-CM | POA: Diagnosis not present

## 2013-03-17 DIAGNOSIS — M25549 Pain in joints of unspecified hand: Secondary | ICD-10-CM | POA: Diagnosis not present

## 2013-04-01 ENCOUNTER — Ambulatory Visit (INDEPENDENT_AMBULATORY_CARE_PROVIDER_SITE_OTHER): Payer: Federal, State, Local not specified - PPO | Admitting: Family Medicine

## 2013-04-01 ENCOUNTER — Encounter: Payer: Self-pay | Admitting: Family Medicine

## 2013-04-01 VITALS — BP 130/90 | Temp 97.8°F | Wt 186.0 lb

## 2013-04-01 DIAGNOSIS — M25539 Pain in unspecified wrist: Secondary | ICD-10-CM | POA: Diagnosis not present

## 2013-04-01 DIAGNOSIS — G40909 Epilepsy, unspecified, not intractable, without status epilepticus: Secondary | ICD-10-CM

## 2013-04-01 DIAGNOSIS — M25531 Pain in right wrist: Secondary | ICD-10-CM

## 2013-04-01 DIAGNOSIS — Z950 Presence of cardiac pacemaker: Secondary | ICD-10-CM

## 2013-04-01 DIAGNOSIS — I495 Sick sinus syndrome: Secondary | ICD-10-CM

## 2013-04-01 DIAGNOSIS — IMO0002 Reserved for concepts with insufficient information to code with codable children: Secondary | ICD-10-CM

## 2013-04-01 DIAGNOSIS — R6889 Other general symptoms and signs: Secondary | ICD-10-CM

## 2013-04-01 DIAGNOSIS — R87619 Unspecified abnormal cytological findings in specimens from cervix uteri: Secondary | ICD-10-CM

## 2013-04-01 NOTE — Progress Notes (Signed)
Chief Complaint  Patient presents with  . Follow-up    HPI:  Follow up:  Has seen neurologist for her seizure disorder and cardiologist for her pacemaker evaluation since our last visit. Notes reviewed. Per pt, in regards to her abnormal pap for which we had advised she see the gynecologist. She is seeing Dr. Ambrose Mantle and reports follow up in 6 months. She reports doing well. She does have wrist pain chronically and saw her orthopedic doctor and he is treating it with tylenol and a brace. Otherwise she is doing well today with no new complaints.  ROS: See pertinent positives and negatives per HPI.  Past Medical History  Diagnosis Date  . Chicken pox   . Conductive hearing loss, childhood onset   . Pacemaker     2012  . Seizure   . Deafness     from medication a a child for chicken pox  . Epilepsy, followed by Dr. Smiley Houseman in neurology 08/26/2012    Past Surgical History  Procedure Laterality Date  . Total hip arthroplasty  07/2011    right  . Total hip arthroplasty  03/2012    left  . Pacemaker insertion  07/2010    Family History  Problem Relation Age of Onset  . Diabetes Father   . Hypertension Father   . Hypertension Mother     History   Social History  . Marital Status: Divorced    Spouse Name: N/A    Number of Children: N/A  . Years of Education: N/A   Social History Main Topics  . Smoking status: Never Smoker   . Smokeless tobacco: Never Used  . Alcohol Use: No     Comment: none  . Drug Use: No  . Sexual Activity: None   Other Topics Concern  . None   Social History Narrative   Work or School: on disability for the deafness, seizure and carpal tunnel      Home Situation: lives with mother and father currently - since 06/2012      Spiritual Beliefs: Christian      Lifestyle: walks for 15-20 minutes a few times per week, well balanced diet             Current outpatient prescriptions:cholecalciferol (VITAMIN D) 400 UNITS TABS, Take 2,000 Units by  mouth 2 (two) times daily., Disp: , Rfl: ;  fish oil-omega-3 fatty acids 1000 MG capsule, Take 2 g by mouth 2 (two) times daily., Disp: , Rfl: ;  LamoTRIgine 300 MG TB24, Take 1 tablet (300 mg total) by mouth 2 (two) times daily., Disp: 60 tablet, Rfl: 11;  magnesium 30 MG tablet, Take 30 mg by mouth 3 (three) times daily., Disp: , Rfl:  Multiple Vitamins-Minerals (ULTRA MEGA PO), Take by mouth daily. Takes 2 tabs daily, Disp: , Rfl: ;  Oxcarbazepine (TRILEPTAL) 300 MG tablet, Take one tablet by mouth daily., Disp: 30 tablet, Rfl: 11;  vitamin C (ASCORBIC ACID) 500 MG tablet, Take 500 mg by mouth 2 (two) times daily., Disp: , Rfl:   EXAM:  Filed Vitals:   04/01/13 1335  BP: 130/90  Temp: 97.8 F (36.6 C)    Body mass index is 31.91 kg/(m^2).  GENERAL: vitals reviewed and listed above, alert, oriented, appears well hydrated and in no acute distress  HEENT: atraumatic, conjunttiva clear, no obvious abnormalities on inspection of external nose and ears  NECK: no obvious masses on inspection  LUNGS: clear to auscultation bilaterally, no wheezes, rales or rhonchi, good air  movement  CV: HRRR, no peripheral edema  MS: moves all extremities without noticeable abnormality  PSYCH: pleasant and cooperative, no obvious depression or anxiety  ASSESSMENT AND PLAN:  Discussed the following assessment and plan:  Epilepsy, followed by Dr. Smiley Houseman in neurology  Sinus node dysfunction  Pacemaker-dual chamber medtronic  Wrist pain, right  Abnormal Pap smear  -doing well, seeing gyn for her abnormal pap smear -advised to follow up yearly and as needed -Patient advised to return or notify a doctor immediately if symptoms worsen or persist or new concerns arise.  There are no Patient Instructions on file for this visit.   [name]

## 2013-04-01 NOTE — Progress Notes (Signed)
Pre visit review using our clinic review tool, if applicable. No additional management support is needed unless otherwise documented below in the visit note. 

## 2013-09-10 DIAGNOSIS — M25559 Pain in unspecified hip: Secondary | ICD-10-CM | POA: Diagnosis not present

## 2013-09-10 DIAGNOSIS — M1710 Unilateral primary osteoarthritis, unspecified knee: Secondary | ICD-10-CM | POA: Diagnosis not present

## 2013-09-12 ENCOUNTER — Ambulatory Visit (INDEPENDENT_AMBULATORY_CARE_PROVIDER_SITE_OTHER): Payer: Federal, State, Local not specified - PPO | Admitting: Neurology

## 2013-09-12 ENCOUNTER — Encounter: Payer: Self-pay | Admitting: Neurology

## 2013-09-12 VITALS — BP 130/70 | HR 68 | Temp 98.2°F | Resp 16 | Ht 63.0 in | Wt 181.1 lb

## 2013-09-12 DIAGNOSIS — G40209 Localization-related (focal) (partial) symptomatic epilepsy and epileptic syndromes with complex partial seizures, not intractable, without status epilepticus: Secondary | ICD-10-CM | POA: Diagnosis not present

## 2013-09-12 DIAGNOSIS — R569 Unspecified convulsions: Secondary | ICD-10-CM | POA: Diagnosis not present

## 2013-09-12 LAB — CBC
HCT: 38.1 % (ref 36.0–46.0)
HEMOGLOBIN: 12.8 g/dL (ref 12.0–15.0)
MCH: 29.9 pg (ref 26.0–34.0)
MCHC: 33.6 g/dL (ref 30.0–36.0)
MCV: 89 fL (ref 78.0–100.0)
Platelets: 251 10*3/uL (ref 150–400)
RBC: 4.28 MIL/uL (ref 3.87–5.11)
RDW: 15.2 % (ref 11.5–15.5)
WBC: 6.8 10*3/uL (ref 4.0–10.5)

## 2013-09-12 NOTE — Progress Notes (Signed)
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  She is accompanied by her mother and interpreter .  Records and images were personally reviewed where available.  UPDATE: She is doing well.  No seizures and no side effects to the medication.  One concern regarding the lamotrigine ER is that it costs $200 out of pocket, which is very steep.  She would like to remain on it because it is the only medication that has resolved her spells (immediate release lamictal was not able to do this).  HISTORY:  Two types: 1 Staring spells, not fully respond, sometimes aware, tenses up but no convulsions, lasts seconds to a minute.  Last episode was in September 2014. 2 Drop attacks, loses conscious and falls.  Sometimes able to hold on to something.  Last episode was in June 2014, after initiating lamotrigine ER.  Current medication:  Lamotrigine ER 300mg  BID.  Trileptal 300mg  daily. She had side effects to Trileptal at 900mg .  Other medications tried, include Dilantin (ineffective), and immediate-release lamotrigine (ineffective).  Prior studies:  EEG: focal slowing and frequent spike and wave activity in left anterior temporal lobe. MRI Brain (04/13/97): focal area of encephalomalacia in posterior left temporal lobe. MRI Brain (10/19/08): possible left mesial temporal sclerosis and lesion in left posterior temporal lobe.  Labs (11/04/12): lamotrigine 14 Labs (12/10/12): 10-hydroxycarbazepine 3  She does not drive.  When she was [age] years old, she developed a virus following a case of the chicken pox.  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  PAST MEDICAL  HISTORY: Past Medical History  Diagnosis Date  . Chicken pox   . Conductive hearing loss, childhood onset   . Pacemaker     2012  . Seizure   . Deafness     from medication a a child for chicken pox  . Epilepsy, followed by Dr. Tomma Rakers in neurology 08/26/2012    MEDICATIONS: Current Outpatient Prescriptions on File Prior to Visit  Medication Sig Dispense Refill  . cholecalciferol (VITAMIN D) 400 UNITS TABS Take 2,000 Units by mouth 2 (two) times daily.      . fish oil-omega-3 fatty acids 1000 MG capsule Take 2 g by mouth 2 (two) times daily.      . LamoTRIgine 300 MG TB24 Take 1 tablet (300 mg total) by mouth 2 (two) times daily.  60 tablet  11  . magnesium 30 MG tablet Take 30 mg by mouth 3 (three) times daily.      . Multiple Vitamins-Minerals (ULTRA MEGA PO) Take by mouth daily. Takes 2 tabs daily      . Oxcarbazepine (TRILEPTAL) 300 MG tablet Take one tablet by mouth daily.  30 tablet  11  . vitamin C (ASCORBIC ACID) 500 MG tablet Take 500 mg by mouth 2 (two) times daily.       No current facility-administered medications on file prior to visit.    ALLERGIES: No Known Allergies  FAMILY HISTORY: Family History  Problem Relation Age of Onset  . Diabetes Father   . Hypertension Father   . Hypertension Mother     SOCIAL HISTORY: History   Social History  . Marital Status: Divorced  Spouse Name: N/A    Number of Children: N/A  . Years of Education: N/A   Occupational History  . Not on file.   Social History Main Topics  . Smoking status: Never Smoker   . Smokeless tobacco: Never Used  . Alcohol Use: No     Comment: none  . Drug Use: No  . Sexual Activity: Not on file   Other Topics Concern  . Not on file   Social History Narrative   Work or School: on disability for the deafness, seizure and carpal tunnel      Home Situation: lives with mother and father currently - since 06/2012      Spiritual Beliefs: Christian      Lifestyle: walks for 15-20 minutes a  few times per week, well balanced diet             REVIEW OF SYSTEMS: Constitutional: No fevers, chills, or sweats, no generalized fatigue, change in appetite Eyes: No visual changes, double vision, eye pain Ear, nose and throat: No hearing loss, ear pain, nasal congestion, sore throat Cardiovascular: No chest pain, palpitations Respiratory:  No shortness of breath at rest or with exertion, wheezes GastrointestinaI: No nausea, vomiting, diarrhea, abdominal pain, fecal incontinence Genitourinary:  No dysuria, urinary retention or frequency Musculoskeletal:  No neck pain, back pain Integumentary: No rash, pruritus, skin lesions Neurological: as above Psychiatric: No depression, insomnia, anxiety Endocrine: No palpitations, fatigue, diaphoresis, mood swings, change in appetite, change in weight, increased thirst Hematologic/Lymphatic:  No anemia, purpura, petechiae. Allergic/Immunologic: no itchy/runny eyes, nasal congestion, recent allergic reactions, rashes  PHYSICAL EXAM: Filed Vitals:   09/12/13 1523  BP: 130/70  Pulse: 68  Temp: 98.2 F (36.8 C)  Resp: 16   General: No acute distress Head:  Normocephalic/atraumatic Neck: supple, no paraspinal tenderness, full range of motion Heart:  Regular rate and rhythm Lungs:  Clear to auscultation bilaterally Back: No paraspinal tenderness Neurological Exam: alert and oriented to person, place, and time. Attention span and concentration intact, recent and remote memory intact, fund of knowledge intact.  Speech fluent and not dysarthric, language intact.  Deaf bilaterally, otherwise CN II-XII intact. Fundoscopic exam unremarkable without vessel changes, exudates, hemorrhages or papilledema.  Bulk and tone normal, muscle strength 5/5 throughout.  Sensation to temperature and vibration intact.  Deep tendon reflexes 2+ throughout, toes downgoing.  Finger to nose and heel to shin testing intact.  Gait steady, Romberg  negative.  IMPRESSION: Left temporal lobe epilepsy  PLAN: 1.  Oxcarbazepine 300mg  daily and lamotrigine ER 300mg  twice daily 2.  Check CBC w/diff, CMP, and levels. 3.  Will contact drug company regarding any possible assistance to afford the lamotrigine ER. 4.  Follow up in 6 months.  Metta Clines, DO  CC:  Colin Benton, DO

## 2013-09-12 NOTE — Patient Instructions (Signed)
1.  Continue oxcarbazepine 300mg  daily and lamotrigine extended release 300mg  twice daily. 2.  Check blood work 3.  Will see if we can get help with coverage of the lamotrigine. 4.  Follow up in 6 months.

## 2013-09-13 LAB — COMPLETE METABOLIC PANEL WITH GFR
ALBUMIN: 4.5 g/dL (ref 3.5–5.2)
ALT: 16 U/L (ref 0–35)
AST: 20 U/L (ref 0–37)
Alkaline Phosphatase: 81 U/L (ref 39–117)
BILIRUBIN TOTAL: 0.4 mg/dL (ref 0.2–1.2)
BUN: 9 mg/dL (ref 6–23)
CALCIUM: 10 mg/dL (ref 8.4–10.5)
CO2: 25 meq/L (ref 19–32)
Chloride: 100 mEq/L (ref 96–112)
Creat: 0.9 mg/dL (ref 0.50–1.10)
GFR, EST AFRICAN AMERICAN: 82 mL/min
GFR, EST NON AFRICAN AMERICAN: 71 mL/min
GLUCOSE: 99 mg/dL (ref 70–99)
Potassium: 4.6 mEq/L (ref 3.5–5.3)
SODIUM: 139 meq/L (ref 135–145)
TOTAL PROTEIN: 7.2 g/dL (ref 6.0–8.3)

## 2013-09-13 LAB — LAMOTRIGINE LEVEL: LAMOTRIGINE LVL: 13.6 ug/mL (ref 4.0–18.0)

## 2013-09-14 LAB — 10-HYDROXYCARBAZEPINE: TRILIPTAL/MTB(OXCARBAZEPIN): 7.5 ug/mL — AB (ref 8.0–35.0)

## 2013-10-09 ENCOUNTER — Telehealth: Payer: Self-pay | Admitting: Cardiology

## 2013-10-09 ENCOUNTER — Encounter: Payer: Self-pay | Admitting: *Deleted

## 2013-10-09 NOTE — Telephone Encounter (Signed)
Certified letter mailed.

## 2013-10-28 ENCOUNTER — Telehealth: Payer: Self-pay | Admitting: *Deleted

## 2013-10-28 NOTE — Telephone Encounter (Signed)
I have called the pharmacy for prior auth  For medication lamotrigine 300mg  ER it is pended for 72 hours . The pharmacy agreed to give her 2 tablets for today and again tomorrow . I advised the mother not to give her anything other than 300mg  ER tablets

## 2013-10-29 ENCOUNTER — Telehealth: Payer: Self-pay | Admitting: *Deleted

## 2013-10-29 NOTE — Telephone Encounter (Signed)
Patients mother Blanch Media is aware that medication was approved and is waiting at [facility] for her

## 2013-11-25 ENCOUNTER — Encounter: Payer: Self-pay | Admitting: Internal Medicine

## 2013-11-25 ENCOUNTER — Ambulatory Visit (INDEPENDENT_AMBULATORY_CARE_PROVIDER_SITE_OTHER): Payer: Medicare Other | Admitting: Internal Medicine

## 2013-11-25 VITALS — BP 125/67 | HR 83 | Ht 63.0 in | Wt 170.0 lb

## 2013-11-25 DIAGNOSIS — I495 Sick sinus syndrome: Secondary | ICD-10-CM | POA: Diagnosis not present

## 2013-11-25 DIAGNOSIS — Z950 Presence of cardiac pacemaker: Secondary | ICD-10-CM

## 2013-11-25 LAB — MDC_IDC_ENUM_SESS_TYPE_INCLINIC
Battery Remaining Longevity: 136 mo
Battery Voltage: 2.79 V
Brady Statistic AP VS Percent: 50 %
Brady Statistic AS VP Percent: 0 %
Lead Channel Impedance Value: 500 Ohm
Lead Channel Impedance Value: 630 Ohm
Lead Channel Pacing Threshold Amplitude: 0.5 V
Lead Channel Pacing Threshold Amplitude: 0.75 V
Lead Channel Pacing Threshold Pulse Width: 0.4 ms
Lead Channel Sensing Intrinsic Amplitude: 22.4 mV
Lead Channel Setting Pacing Amplitude: 2.5 V
Lead Channel Setting Pacing Pulse Width: 0.4 ms
Lead Channel Setting Sensing Sensitivity: 5.6 mV
MDC IDC MSMT BATTERY IMPEDANCE: 155 Ohm
MDC IDC MSMT LEADCHNL RA PACING THRESHOLD PULSEWIDTH: 0.4 ms
MDC IDC MSMT LEADCHNL RA SENSING INTR AMPL: 4 mV
MDC IDC SESS DTM: 20150728110741
MDC IDC SET LEADCHNL RA PACING AMPLITUDE: 2 V
MDC IDC STAT BRADY AP VP PERCENT: 0 %
MDC IDC STAT BRADY AS VS PERCENT: 50 %

## 2013-11-25 NOTE — Patient Instructions (Signed)
Your physician recommends that you continue on your current medications as directed. Please refer to the Current Medication list given to you today.  Follow up with device clinic in 6 months.  Your physician wants you to follow-up in: 12 months with Dr. Gari Crown will receive a reminder letter in the mail two months in advance. If you don't receive a letter, please call our office to schedule the follow-up appointment.

## 2013-11-25 NOTE — Progress Notes (Signed)
Patient Care Team: Lucretia Kern, DO as PCP - General (Family Medicine)   HPI  [name] [name] is a 58 y.o. female Newly here to establish for pacemaker implanted in 2012 in [place]  Subsequent records suggest that was implanted for asystole during tilt table testing.  She had spelled her whole life long; medication was initiated by neurology with improvement  She also complains of shoulder pain  L post shoulder seems to be associated with exertion and a few minutes duration. Is unassociated with shortness of breath or dyspnea. Is aggravated by arm movement       Past Medical History  Diagnosis Date  . Chicken pox   . Conductive hearing loss, childhood onset   . Pacemaker     2012  . Seizure   . Deafness     from medication a a child for chicken pox  . Epilepsy, followed by Dr. Tomma Rakers in neurology 08/26/2012    Past Surgical History  Procedure Laterality Date  . Total hip arthroplasty  07/2011    right  . Total hip arthroplasty  03/2012    left  . Pacemaker insertion  07/2010    Current Outpatient Prescriptions  Medication Sig Dispense Refill  . Cholecalciferol (VITAMIN D) 2000 UNITS CAPS Take by mouth 2 (two) times daily.      . fish oil-omega-3 fatty acids 1000 MG capsule Take 2 g by mouth 2 (two) times daily.      . LamoTRIgine 300 MG TB24 Take by mouth 2 (two) times daily.      . magnesium 30 MG tablet Take 30 mg by mouth 3 (three) times daily.      . Multiple Vitamins-Minerals (ULTRA MEGA PO) Take by mouth daily. Takes 2 tabs daily      . Oxcarbazepine (TRILEPTAL) 300 MG tablet Take one tablet by mouth daily.  30 tablet  11  . vitamin C (ASCORBIC ACID) 500 MG tablet Take 500 mg by mouth 2 (two) times daily.       No current facility-administered medications for this visit.    No Known Allergies  Review of Systems negative except from HPI and PMH  Physical Exam BP 125/67  Pulse 83  Ht 5\' 3"  (1.6 m)  Wt 170 lb (77.111 kg)  BMI 30.12 kg/m2 Well  developed and nourished in no acute distress HENT normal Neck supple with JVP-flat Clear Device pocket well healed; without hematoma or erythema.  There is no tethering  Regular rate and rhythm, no murmurs or gallops Abd-soft with active BS No Clubbing cyanosis edema There is tenderness at her left posterior shoulder Skin-warm and dry A & Oriented  Grossly normal sensory and motor function   ECG dated 20 to May 2014 demonstrates atrial pacing Intervals 26/08/39  Assessment and  Plan  Syncope  A sinus node dysfunction  Pacemaker-[employer]  Seizures  Is interesting to speculate as to whether her seizure disorder and her cardiac disorder i.e. dysautonomia are related; I think, given the relative improvement in her seizures with medications in the care of Dr. [name] that unlikely.

## 2014-01-20 DIAGNOSIS — Z23 Encounter for immunization: Secondary | ICD-10-CM | POA: Diagnosis not present

## 2014-02-13 ENCOUNTER — Other Ambulatory Visit: Payer: Self-pay

## 2014-03-16 ENCOUNTER — Ambulatory Visit (INDEPENDENT_AMBULATORY_CARE_PROVIDER_SITE_OTHER): Payer: Medicare Other | Admitting: Neurology

## 2014-03-16 ENCOUNTER — Encounter: Payer: Self-pay | Admitting: Neurology

## 2014-03-16 VITALS — BP 144/88 | HR 75 | Ht 63.0 in | Wt 182.1 lb

## 2014-03-16 DIAGNOSIS — G44219 Episodic tension-type headache, not intractable: Secondary | ICD-10-CM

## 2014-03-16 DIAGNOSIS — G40209 Localization-related (focal) (partial) symptomatic epilepsy and epileptic syndromes with complex partial seizures, not intractable, without status epilepticus: Secondary | ICD-10-CM

## 2014-03-16 MED ORDER — OXCARBAZEPINE 300 MG PO TABS: 300.0000 mg | ORAL_TABLET | Freq: Two times a day (BID) | ORAL | Status: DC

## 2014-03-16 NOTE — Progress Notes (Signed)
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  She is accompanied by her mother, who provides some history, and an interpretor .  Records and labs personally reviewed.  UPDATE: Current medications:  Lamotrigine ER 300mg  BID.  Trileptal 300mg  daily. She had two events over the past month that were suspicious for seizure.  On 02/18/14, her mother walked into the room and found Selin standing with her arm outstretched.  She was not immediately responsive, but afterwards she claims to have been aware during the spell.  It lasted about a minute.  It was brief.  On 03/12/14, she was out shopping, when suddenly she had a strange sensation like she was cold or afraid.  When she was still, she felt fine and it quickly passed.  She has been compliant with her medications.  She also reports an episode of headache that occurred a week or two ago.  It was in a band-like distribution around the back and side of her head.  It was a throbbing and squeezing pain, about 5-6/10.  There was associated dizziness but no nausea, photophobia or phonophobia.  It lasted about 5-6 hours, but she did not take a pain reliever right away until much later.  She took ibuprofen.  There have been no recurrent headaches.  09/12/13 LABS:  WBC 6.8, HGB 12.8, HCT 38.1, PLT 251, Na 139, K 4.6, Cl 100, CO2 25, glucose 99, BUN 9, Cr 0.90, TB 0.4 ALP 81, AST 20, ALT 16, TP 7.2, Alb 4.5, Ca 10, lamotrigine level 13.6, 10-hydroxycarbazeine 7.5  HISTORY:   Two types: 1 Staring spells, not fully respond, sometimes aware, tenses up but no convulsions, lasts seconds to a minute.  Last episode was in September 2014.  2 Drop attacks, loses  conscious and falls.  Sometimes able to hold on to something.  Last episode was in June 2014, after initiating lamotrigine ER.  Prior medications:  She had side effects to Trileptal at 900mg .  Other medications tried, include Dilantin (ineffective), and immediate-release lamotrigine (ineffective).  Prior studies: EEG: focal slowing and frequent spike and wave activity in left anterior temporal lobe. MRI Brain (04/13/97): focal area of encephalomalacia in posterior left temporal lobe. MRI Brain (10/19/08): possible left mesial temporal sclerosis and lesion in left posterior temporal lobe. Labs (11/04/12): lamotrigine 14 Labs (12/10/12): 10-hydroxycarbazepine 3  She does not drive.  When she was [age] years old, she developed a virus following a case of the chicken pox. 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  PAST MEDICAL HISTORY: Past Medical History  Diagnosis Date  . Chicken pox   . Conductive hearing loss, childhood onset   . Pacemaker     2012  . Seizure   . Deafness     from medication a a child for chicken pox  . Epilepsy, followed by Dr. Tomma Rakers in neurology 08/26/2012    MEDICATIONS: Current Outpatient Prescriptions on File Prior to Visit  Medication Sig Dispense Refill  . Cholecalciferol (VITAMIN D) 2000 UNITS CAPS Take by mouth 2 (two) times daily.    . fish oil-omega-3 fatty acids 1000 MG capsule Take 2 g by mouth 2 (two) times daily.    Marland Kitchen  LamoTRIgine 300 MG TB24 Take by mouth 2 (two) times daily.    . magnesium 30 MG tablet Take 30 mg by mouth daily.     . Multiple Vitamins-Minerals (ULTRA MEGA PO) Take by mouth daily.     . vitamin C (ASCORBIC ACID) 500 MG tablet Take 500 mg by mouth 2 (two) times daily.     No current facility-administered medications on file prior to visit.    ALLERGIES: No Known Allergies  FAMILY HISTORY: Family History  Problem Relation Age of Onset  . Diabetes Father   . Hypertension Father   .  Hypertension Mother     SOCIAL HISTORY: History   Social History  . Marital Status: Divorced    Spouse Name: N/A    Number of Children: N/A  . Years of Education: N/A   Occupational History  . Not on file.   Social History Main Topics  . Smoking status: Never Smoker   . Smokeless tobacco: Never Used  . Alcohol Use: No     Comment: none  . Drug Use: No  . Sexual Activity: Not on file   Other Topics Concern  . Not on file   Social History Narrative   Work or School: on disability for the deafness, seizure and carpal tunnel      Home Situation: lives with mother and father currently - since 06/2012      Spiritual Beliefs: Christian      Lifestyle: walks for 15-20 minutes a few times per week, well balanced diet             REVIEW OF SYSTEMS: Constitutional: No fevers, chills, or sweats, no generalized fatigue, change in appetite Eyes: No visual changes, double vision, eye pain Ear, nose and throat: No hearing loss, ear pain, nasal congestion, sore throat Cardiovascular: No chest pain, palpitations Respiratory:  No shortness of breath at rest or with exertion, wheezes GastrointestinaI: No nausea, vomiting, diarrhea, abdominal pain, fecal incontinence Genitourinary:  No dysuria, urinary retention or frequency Musculoskeletal:  No neck pain, back pain Integumentary: No rash, pruritus, skin lesions Neurological: as above Psychiatric: No depression, insomnia, anxiety Endocrine: No palpitations, fatigue, diaphoresis, mood swings, change in appetite, change in weight, increased thirst Hematologic/Lymphatic:  No anemia, purpura, petechiae. Allergic/Immunologic: no itchy/runny eyes, nasal congestion, recent allergic reactions, rashes  PHYSICAL EXAM: Filed Vitals:   03/16/14 1236  BP: 144/88  Pulse: 75   General: No acute distress Head:  Normocephalic/atraumatic Eyes:  Fundoscopic exam unremarkable without vessel changes, exudates, hemorrhages or papilledema. Neck:  supple, no paraspinal tenderness, full range of motion Heart:  Regular rate and rhythm Lungs:  Clear to auscultation bilaterally Back: No paraspinal tenderness Neurological Exam: alert and oriented to person, place, and time. Attention span and concentration intact, recent and remote memory intact, fund of knowledge intact.  Speech fluent and not dysarthric, language intact.  She is deaf.  Otherwise, CN II-XII intact. Fundoscopic exam unremarkable without vessel changes, exudates, hemorrhages or papilledema.  Bulk and tone normal, muscle strength 5/5 throughout.  Sensation to light touch intact.  Deep tendon reflexes 2+ throughout.  Finger to nose testing intact.  Gait normal.  IMPRESSION: Temporal lobe epilepsy.  It sounds like she had a couple of small spells. Tension-type headache  PLAN: 1.  Will increase oxcarbazepine to 300mg  twice daily 2.  Continue lamotrigine ER 300mg  twice daily 3.  Will check another baseline BMP 4.  Take ibuprofen sooner if a headache recurs. 5.  Follow up in 6 months.  Metta Clines, DO  CC:  Colin Benton, DO

## 2014-03-16 NOTE — Patient Instructions (Signed)
1.  We will increase oxcarbazepine to 300mg  twice daily (1 in morning and 1 at bedtime) 2.  Continue lamotrigine ER 300mg  twice daily 3.  We will check BMP 4.  Follow up in 6 months.

## 2014-03-17 LAB — BASIC METABOLIC PANEL
BUN: 12 mg/dL (ref 6–23)
CHLORIDE: 97 meq/L (ref 96–112)
CO2: 29 mEq/L (ref 19–32)
Calcium: 9.9 mg/dL (ref 8.4–10.5)
Creat: 0.92 mg/dL (ref 0.50–1.10)
GLUCOSE: 82 mg/dL (ref 70–99)
Potassium: 4.1 mEq/L (ref 3.5–5.3)
Sodium: 135 mEq/L (ref 135–145)

## 2014-03-20 ENCOUNTER — Telehealth: Payer: Self-pay | Admitting: *Deleted

## 2014-03-20 NOTE — Telephone Encounter (Signed)
-----   Message from Dudley Major, DO sent at 03/19/2014  7:07 AM EST ----- Labs are okay ----- Message -----    From: Lab in [facility] Interface    Sent: 03/17/2014   1:06 AM      To: Dudley Major, DO

## 2014-03-20 NOTE — Telephone Encounter (Signed)
Patient is aware of normal labs  

## 2014-03-29 ENCOUNTER — Encounter (HOSPITAL_COMMUNITY): Payer: Self-pay

## 2014-03-29 ENCOUNTER — Emergency Department (HOSPITAL_COMMUNITY)
Admission: EM | Admit: 2014-03-29 | Discharge: 2014-03-30 | Disposition: A | Discharge status: Home / Self Care | Payer: Medicare Other | Attending: Emergency Medicine | Admitting: Emergency Medicine

## 2014-03-29 ENCOUNTER — Emergency Department (HOSPITAL_COMMUNITY): Payer: Medicare Other

## 2014-03-29 DIAGNOSIS — S199XXA Unspecified injury of neck, initial encounter: Secondary | ICD-10-CM | POA: Diagnosis not present

## 2014-03-29 DIAGNOSIS — Y9289 Other specified places as the place of occurrence of the external cause: Secondary | ICD-10-CM | POA: Diagnosis not present

## 2014-03-29 DIAGNOSIS — R569 Unspecified convulsions: Secondary | ICD-10-CM | POA: Insufficient documentation

## 2014-03-29 DIAGNOSIS — S3993XA Unspecified injury of pelvis, initial encounter: Secondary | ICD-10-CM | POA: Diagnosis not present

## 2014-03-29 DIAGNOSIS — Y9389 Activity, other specified: Secondary | ICD-10-CM | POA: Diagnosis not present

## 2014-03-29 DIAGNOSIS — S0101XA Laceration without foreign body of scalp, initial encounter: Secondary | ICD-10-CM | POA: Insufficient documentation

## 2014-03-29 DIAGNOSIS — R55 Syncope and collapse: Secondary | ICD-10-CM | POA: Diagnosis not present

## 2014-03-29 DIAGNOSIS — S79911A Unspecified injury of right hip, initial encounter: Secondary | ICD-10-CM | POA: Diagnosis not present

## 2014-03-29 DIAGNOSIS — W19XXXA Unspecified fall, initial encounter: Secondary | ICD-10-CM | POA: Diagnosis not present

## 2014-03-29 DIAGNOSIS — Y998 Other external cause status: Secondary | ICD-10-CM | POA: Diagnosis not present

## 2014-03-29 DIAGNOSIS — IMO0002 Reserved for concepts with insufficient information to code with codable children: Secondary | ICD-10-CM

## 2014-03-29 DIAGNOSIS — H9190 Unspecified hearing loss, unspecified ear: Secondary | ICD-10-CM | POA: Diagnosis not present

## 2014-03-29 DIAGNOSIS — Z950 Presence of cardiac pacemaker: Secondary | ICD-10-CM | POA: Insufficient documentation

## 2014-03-29 DIAGNOSIS — T148 Other injury of unspecified body region: Secondary | ICD-10-CM | POA: Diagnosis not present

## 2014-03-29 DIAGNOSIS — S79912A Unspecified injury of left hip, initial encounter: Secondary | ICD-10-CM | POA: Diagnosis not present

## 2014-03-29 DIAGNOSIS — S0990XA Unspecified injury of head, initial encounter: Secondary | ICD-10-CM

## 2014-03-29 DIAGNOSIS — S0003XA Contusion of scalp, initial encounter: Secondary | ICD-10-CM | POA: Diagnosis not present

## 2014-03-29 DIAGNOSIS — S0190XA Unspecified open wound of unspecified part of head, initial encounter: Secondary | ICD-10-CM | POA: Diagnosis not present

## 2014-03-29 DIAGNOSIS — R9431 Abnormal electrocardiogram [ECG] [EKG]: Secondary | ICD-10-CM | POA: Diagnosis not present

## 2014-03-29 HISTORY — DX: Unspecified convulsions: R56.9

## 2014-03-29 LAB — BASIC METABOLIC PANEL
ANION GAP: 15 (ref 5–15)
BUN: 14 mg/dL (ref 6–23)
CALCIUM: 10 mg/dL (ref 8.4–10.5)
CO2: 25 meq/L (ref 19–32)
CREATININE: 0.78 mg/dL (ref 0.50–1.10)
Chloride: 94 mEq/L — ABNORMAL LOW (ref 96–112)
GFR calc Af Amer: >90 mL/min (ref >90)
GLUCOSE: 87 mg/dL (ref 70–99)
Potassium: 4.3 mEq/L (ref 3.7–5.3)
Sodium: 134 mEq/L — ABNORMAL LOW (ref 137–147)

## 2014-03-29 LAB — CBC WITH DIFFERENTIAL/PLATELET
BASOS ABS: 0.1 10*3/uL (ref 0.0–0.1)
Basophils Relative: 1 % (ref 0–1)
EOS PCT: 2 % (ref 0–5)
Eosinophils Absolute: 0.2 10*3/uL (ref 0.0–0.7)
HEMATOCRIT: 37.4 % (ref 36.0–46.0)
Hemoglobin: 12.6 g/dL (ref 12.0–15.0)
LYMPHS PCT: 42 % (ref 12–46)
Lymphs Abs: 3.3 10*3/uL (ref 0.7–4.0)
MCH: 29.6 pg (ref 26.0–34.0)
MCHC: 33.7 g/dL (ref 30.0–36.0)
MCV: 88 fL (ref 78.0–100.0)
MONO ABS: 0.5 10*3/uL (ref 0.1–1.0)
Monocytes Relative: 7 % (ref 3–12)
Neutro Abs: 3.8 10*3/uL (ref 1.7–7.7)
Neutrophils Relative %: 48 % (ref 43–77)
Platelets: 224 10*3/uL (ref 150–400)
RBC: 4.25 MIL/uL (ref 3.87–5.11)
RDW: 14.2 % (ref 11.5–15.5)
WBC: 7.9 10*3/uL (ref 4.0–10.5)

## 2014-03-29 LAB — PROTIME-INR
INR: 1.05 (ref 0.00–1.49)
Prothrombin Time: 13.8 seconds (ref 11.6–15.2)

## 2014-03-29 LAB — I-STAT TROPONIN, ED: Troponin i, poc: 0 ng/mL (ref 0.00–0.08)

## 2014-03-29 MED ORDER — FENTANYL CITRATE 0.05 MG/ML IJ SOLN
50.0000 ug | Freq: Once | INTRAMUSCULAR | Status: AC
  Administered 2014-03-29: 50 ug via INTRAVENOUS
  Filled 2014-03-29: qty 2

## 2014-03-29 MED ORDER — ONDANSETRON HCL 4 MG/2ML IJ SOLN: 4.0000 mg | Freq: Once | INTRAMUSCULAR | Status: DC

## 2014-03-29 MED ORDER — MORPHINE SULFATE 4 MG/ML IJ SOLN: 4.0000 mg | Freq: Once | INTRAMUSCULAR | Status: DC

## 2014-03-29 MED ORDER — LIDOCAINE-EPINEPHRINE (PF) 2 %-1:200000 IJ SOLN
10.0000 mL | Freq: Once | INTRAMUSCULAR | Status: AC
  Administered 2014-03-29: 10 mL
  Filled 2014-03-29: qty 20

## 2014-03-29 MED ORDER — SODIUM CHLORIDE 0.9 % IV BOLUS (SEPSIS)
1000.0000 mL | Freq: Once | INTRAVENOUS | Status: AC
  Administered 2014-03-29: 1000 mL via INTRAVENOUS

## 2014-03-29 NOTE — ED Notes (Signed)
Patient returned from CT

## 2014-03-29 NOTE — ED Notes (Signed)
PA Rob at the bedside attempting to stitch patient.

## 2014-03-29 NOTE — ED Notes (Signed)
MD at the bedside stitching patient.

## 2014-03-29 NOTE — ED Notes (Signed)
Phlebotomy at the bedside  

## 2014-03-29 NOTE — ED Provider Notes (Signed)
CSN: [id number]     Arrival date & time 03/29/14  1847 History   First MD Initiated Contact with Patient 03/29/14 1848     Chief Complaint  Patient presents with  . Fall     (Consider location/radiation/quality/duration/timing/severity/associated sxs/prior Treatment) HPI Comments: Patient presents to the ED with a chief complaint of syncope vs seizure.  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  History is given through family member.  Patient had unwitnessed fall and possible seizure.  She was found by her brother and mother.  She has a history seizures.  She also has a pacemaker.  She complains of pain in her head, neck, and hips.  She denies any pain elsewhere.  She has not taken anything for her symptoms.  There are no aggravating or alleviating factors.  The history is provided by the patient. No language interpreter was used.    No past medical history on file. No past surgical history on file. No family history on file. History  Substance Use Topics  . Smoking status: Not on file  . Smokeless tobacco: Not on file  . Alcohol Use: Not on file   OB History    No data available     Review of Systems  Constitutional: Negative for fever and chills.  Respiratory: Negative for shortness of breath.   Cardiovascular: Negative for chest pain.  Gastrointestinal: Negative for nausea, vomiting, diarrhea and constipation.  Genitourinary: Negative for dysuria.  Musculoskeletal: Positive for arthralgias.  Skin: Positive for wound.  Neurological: Positive for seizures, syncope and headaches.  All other systems reviewed and are negative.     Allergies  Review of patient's allergies indicates not on file.  Home Medications   Prior to Admission medications   Not on File   There were no vitals taken for this visit. Physical Exam  Constitutional: She is oriented to person, place, and time. She appears well-developed and well-nourished.  On  back board and c-collar  HENT:  Head: Normocephalic and atraumatic.  Eyes: Conjunctivae and EOM are normal. Pupils are equal, round, and reactive to light.  Neck: Normal range of motion. Neck supple.  Cardiovascular: Normal rate and regular rhythm.  Exam reveals no gallop and no friction rub.   No murmur heard. Pulmonary/Chest: Effort normal and breath sounds normal. No respiratory distress. She has no wheezes. She has no rales. She exhibits no tenderness.  Abdominal: Soft. Bowel sounds are normal. She exhibits no distension and no mass. There is no tenderness. There is no rebound and no guarding.  Musculoskeletal: Normal range of motion. She exhibits no edema or tenderness.  Bilateral hip pain  No TLS spine tenderness Moves all extremities  Neurological: She is alert and oriented to person, place, and time.  Skin: Skin is warm and dry.  Laceration to posterior scalp  Psychiatric: She has a normal mood and affect. Her behavior is normal. Judgment and thought content normal.  Nursing note and vitals reviewed.   ED Course  Procedures (including critical care time) Results for orders placed or performed during the hospital encounter of 03/29/14  Protime-INR  Result Value Ref Range   Prothrombin Time 13.8 11.6 - 15.2 seconds   INR 1.05 0.00 - 1.49  CBC with Differential  Result Value Ref Range   WBC 7.9 4.0 - 10.5 K/uL   RBC 4.25 3.87 - 5.11 MIL/uL   Hemoglobin 12.6 12.0 - 15.0 g/dL   HCT 37.4 36.0 -  46.0 %   MCV 88.0 78.0 - 100.0 fL   MCH 29.6 26.0 - 34.0 pg   MCHC 33.7 30.0 - 36.0 g/dL   RDW 14.2 11.5 - 15.5 %   Platelets 224 150 - 400 K/uL   Neutrophils Relative % 48 43 - 77 %   Neutro Abs 3.8 1.7 - 7.7 K/uL   Lymphocytes Relative 42 12 - 46 %   Lymphs Abs 3.3 0.7 - 4.0 K/uL   Monocytes Relative 7 3 - 12 %   Monocytes Absolute 0.5 0.1 - 1.0 K/uL   Eosinophils Relative 2 0 - 5 %   Eosinophils Absolute 0.2 0.0 - 0.7 K/uL   Basophils Relative 1 0 - 1 %   Basophils Absolute  0.1 0.0 - 0.1 K/uL  Basic metabolic panel  Result Value Ref Range   Sodium 134 (L) 137 - 147 mEq/L   Potassium 4.3 3.7 - 5.3 mEq/L   Chloride 94 (L) 96 - 112 mEq/L   CO2 25 19 - 32 mEq/L   Glucose, Bld 87 70 - 99 mg/dL   BUN 14 6 - 23 mg/dL   Creatinine, Ser 0.78 0.50 - 1.10 mg/dL   Calcium 10.0 8.4 - 10.5 mg/dL   GFR calc non Af Amer >90 >90 mL/min   GFR calc Af Amer >90 >90 mL/min   Anion gap 15 5 - 15  I-stat troponin, ED  Result Value Ref Range   Troponin i, poc 0.00 0.00 - 0.08 ng/mL   Comment 3          I-stat chem 8, ed  Result Value Ref Range   Sodium 135 (L) 137 - 147 mEq/L   Potassium 4.1 3.7 - 5.3 mEq/L   Chloride 98 96 - 112 mEq/L   BUN 11 6 - 23 mg/dL   Creatinine, Ser 0.70 0.50 - 1.10 mg/dL   Glucose, Bld 105 (H) 70 - 99 mg/dL   Calcium, Ion 1.20 1.12 - 1.23 mmol/L   TCO2 24 0 - 100 mmol/L   Hemoglobin 11.9 (L) 12.0 - 15.0 g/dL   HCT 35.0 (L) 36.0 - 46.0 %   Dg Pelvis 1-2 Views  03/29/2014   CLINICAL DATA:  [place] today due to a syncopal episode. No current pelvic or hip pain.  EXAM: PELVIS - 1-2 VIEW  COMPARISON:  None.  FINDINGS: Bilateral hip prostheses. No fracture or dislocation seen. Diffuse osteopenia.  IMPRESSION: No fracture or dislocation.   Electronically Signed   By: Enrique Sack M.D.   On: 03/29/2014 20:40   Ct Head Wo Contrast  03/29/2014   CLINICAL DATA:  58 year old female with fall, seizure and head and neck injury.  EXAM: CT HEAD WITHOUT CONTRAST  CT CERVICAL SPINE WITHOUT CONTRAST  TECHNIQUE: Multidetector CT imaging of the head and cervical spine was performed following the standard protocol without intravenous contrast. Multiplanar CT image reconstructions of the cervical spine were also generated.  COMPARISON:  None.  FINDINGS: CT HEAD FINDINGS  No intracranial abnormalities are identified, including mass lesion or mass effect, hydrocephalus, extra-axial fluid collection, midline shift, hemorrhage, or acute infarction. The visualized bony  calvarium is unremarkable.  A high posterior scalp hematoma is noted.  CT CERVICAL SPINE FINDINGS  Normal alignment is noted.  There is no evidence of acute fracture, subluxation or prevertebral soft tissue swelling.  Mild to moderate degenerative disc disease, spondylosis and broad-based disc bulges from C5-C7 identified contributing to moderate central spinal and biforaminal narrowing.  IMPRESSION: High posterior scalp  hematoma without fracture or intracranial abnormality.  No static evidence of acute injury to the cervical spine.  Moderate degenerative changes from C5-C7 contributing to moderate central spinal and biforaminal narrowing.   Electronically Signed   By: Hassan Rowan M.D.   On: 03/29/2014 21:52   Ct Cervical Spine Wo Contrast  03/29/2014   CLINICAL DATA:  58 year old female with fall, seizure and head and neck injury.  EXAM: CT HEAD WITHOUT CONTRAST  CT CERVICAL SPINE WITHOUT CONTRAST  TECHNIQUE: Multidetector CT imaging of the head and cervical spine was performed following the standard protocol without intravenous contrast. Multiplanar CT image reconstructions of the cervical spine were also generated.  COMPARISON:  None.  FINDINGS: CT HEAD FINDINGS  No intracranial abnormalities are identified, including mass lesion or mass effect, hydrocephalus, extra-axial fluid collection, midline shift, hemorrhage, or acute infarction. The visualized bony calvarium is unremarkable.  A high posterior scalp hematoma is noted.  CT CERVICAL SPINE FINDINGS  Normal alignment is noted.  There is no evidence of acute fracture, subluxation or prevertebral soft tissue swelling.  Mild to moderate degenerative disc disease, spondylosis and broad-based disc bulges from C5-C7 identified contributing to moderate central spinal and biforaminal narrowing.  IMPRESSION: High posterior scalp hematoma without fracture or intracranial abnormality.  No static evidence of acute injury to the cervical spine.  Moderate degenerative  changes from C5-C7 contributing to moderate central spinal and biforaminal narrowing.   Electronically Signed   By: Hassan Rowan M.D.   On: 03/29/2014 21:52      EKG Interpretation None      MDM   Final diagnoses:  Fall    Patient had a fall after a syncopal episode vs seizure.  Additional history provided by the patient's mother, who states that the patient has petite mal seizures that cause her to fall.  Mother states that this is what it looked like the patient experienced.  In reviewing the patient's record under another name, cardiology believes these syncopal episodes to be neurologic related and not cardiac.  EKG is unchanged.    Imaging is negative.  C-collar removed by me.  No other injuries.  Complex laceration repaired by Dr. Wilson Singer.    Labs and imaging are reassuring.  Patient did have some blood loss from the fall and laceration.  Will give fluids and recheck H/H.  H/H is stable.  Patient ambulates appropriately.  Patient encouraged to follow-up with her neurologist and her cardiologist.  Patient understands and agrees with the plan.  She is stable and ready for discharge.  Montine Circle, PA-C 03/30/14 [id number]  Virgel Manifold, MD 04/02/14 [phone]

## 2014-03-29 NOTE — ED Notes (Signed)
Per EMS, Patient had a unwitnessed fall and seizure outside. Patient was found by husband and mother. Patient has a history of seizures. Patient has a laceration to the head and found in pool of blood. Patient is alert and oriented upon arrival. Patient is deaf and signs. Vitals per EMS: 164/114, 88 HR, 16 RR, 99 %, 105 CBG.

## 2014-03-30 DIAGNOSIS — S0101XA Laceration without foreign body of scalp, initial encounter: Secondary | ICD-10-CM | POA: Diagnosis not present

## 2014-03-30 LAB — I-STAT CHEM 8, ED
BUN: 11 mg/dL (ref 6–23)
CHLORIDE: 98 meq/L (ref 96–112)
CREATININE: 0.7 mg/dL (ref 0.50–1.10)
Calcium, Ion: 1.2 mmol/L (ref 1.12–1.23)
GLUCOSE: 105 mg/dL — AB (ref 70–99)
HCT: 35 % — ABNORMAL LOW (ref 36.0–46.0)
Hemoglobin: 11.9 g/dL — ABNORMAL LOW (ref 12.0–15.0)
Potassium: 4.1 mEq/L (ref 3.7–5.3)
Sodium: 135 mEq/L — ABNORMAL LOW (ref 137–147)
TCO2: 24 mmol/L (ref 0–100)

## 2014-03-30 MED ORDER — HYDROCODONE-ACETAMINOPHEN 5-325 MG PO TABS: 1.0000 | ORAL_TABLET | Freq: Four times a day (QID) | ORAL | Status: DC | PRN

## 2014-03-30 NOTE — Discharge Instructions (Signed)
You need to follow-up with your cardiologist and your neurologist.   Please keep the dressing on your wound until tomorrow.  You may shower after that.  Please do not scrub the wound or soak the wound.  Water may run over it.  The sutures that were placed are absorbable.  You do not need to have them taken out.  Please return if your symptoms worsen or if you have increased bleeding.   Concussion A concussion, or closed-head injury, is a brain injury caused by a direct blow to the head or by a quick and sudden movement (jolt) of the head or neck. Concussions are usually not life-threatening. Even so, the effects of a concussion can be serious. If you have had a concussion before, you are more likely to experience concussion-like symptoms after a direct blow to the head.  CAUSES  Direct blow to the head, such as from running into another player during a soccer game, being hit in a fight, or hitting your head on a hard surface.  A jolt of the head or neck that causes the brain to move back and forth inside the skull, such as in a car crash. SIGNS AND SYMPTOMS The signs of a concussion can be hard to notice. Early on, they may be missed by you, family members, and health care providers. You may look fine but act or feel differently. Symptoms are usually temporary, but they may last for days, weeks, or even longer. Some symptoms may appear right away while others may not show up for hours or days. Every head injury is different. Symptoms include:  Mild to moderate headaches that will not go away.  A feeling of pressure inside your head.  Having more trouble than usual:  Learning or remembering things you have heard.  Answering questions.  Paying attention or concentrating.  Organizing daily tasks.  Making decisions and solving problems.  Slowness in thinking, acting or reacting, speaking, or reading.  Getting lost or being easily confused.  Feeling tired all the time or lacking energy  (fatigued).  Feeling drowsy.  Sleep disturbances.  Sleeping more than usual.  Sleeping less than usual.  Trouble falling asleep.  Trouble sleeping (insomnia).  Loss of balance or feeling lightheaded or dizzy.  Nausea or vomiting.  Numbness or tingling.  Increased sensitivity to:  Sounds.  Lights.  Distractions.  Vision problems or eyes that tire easily.  Diminished sense of taste or smell.  Ringing in the ears.  Mood changes such as feeling sad or anxious.  Becoming easily irritated or angry for little or no reason.  Lack of motivation.  Seeing or hearing things other people do not see or hear (hallucinations). DIAGNOSIS Your health care provider can usually diagnose a concussion based on a description of your injury and symptoms. He or she will ask whether you passed out (lost consciousness) and whether you are having trouble remembering events that happened right before and during your injury. Your evaluation might include:  A brain scan to look for signs of injury to the brain. Even if the test shows no injury, you may still have a concussion.  Blood tests to be sure other problems are not present. TREATMENT  Concussions are usually treated in an emergency department, in urgent care, or at a clinic. You may need to stay in the hospital overnight for further treatment.  Tell your health care provider if you are taking any medicines, including prescription medicines, over-the-counter medicines, and natural remedies. Some medicines,  such as blood thinners (anticoagulants) and aspirin, may increase the chance of complications. Also tell your health care provider whether you have had alcohol or are taking illegal drugs. This information may affect treatment.  Your health care provider will send you home with important instructions to follow.  How fast you will recover from a concussion depends on many factors. These factors include how severe your concussion is,  what part of your brain was injured, your age, and how healthy you were before the concussion.  Most people with mild injuries recover fully. Recovery can take time. In general, recovery is slower in older persons. Also, persons who have had a concussion in the past or have other medical problems may find that it takes longer to recover from their current injury. HOME CARE INSTRUCTIONS General Instructions  Carefully follow the directions your health care provider gave you.  Only take over-the-counter or prescription medicines for pain, discomfort, or fever as directed by your health care provider.  Take only those medicines that your health care provider has approved.  Do not drink alcohol until your health care provider says you are well enough to do so. Alcohol and certain other drugs may slow your recovery and can put you at risk of further injury.  If it is harder than usual to remember things, write them down.  If you are easily distracted, try to do one thing at a time. For example, do not try to watch TV while fixing dinner.  Talk with family members or close friends when making important decisions.  Keep all follow-up appointments. Repeated evaluation of your symptoms is recommended for your recovery.  Watch your symptoms and tell others to do the same. Complications sometimes occur after a concussion. Older adults with a brain injury may have a higher risk of serious complications, such as a blood clot on the brain.  Tell your teachers, school nurse, school counselor, coach, athletic trainer, or work Freight forwarder about your injury, symptoms, and restrictions. Tell them about what you can or cannot do. They should watch for:  Increased problems with attention or concentration.  Increased difficulty remembering or learning new information.  Increased time needed to complete tasks or assignments.  Increased irritability or decreased ability to cope with stress.  Increased  symptoms.  Rest. Rest helps the brain to heal. Make sure you:  Get plenty of sleep at night. Avoid staying up late at night.  Keep the same bedtime hours on weekends and weekdays.  Rest during the day. Take daytime naps or rest breaks when you feel tired.  Limit activities that require a lot of thought or concentration. These include:  Doing homework or job-related work.  Watching TV.  Working on the computer.  Avoid any situation where there is potential for another head injury (football, hockey, soccer, basketball, martial arts, downhill snow sports and horseback riding). Your condition will get worse every time you experience a concussion. You should avoid these activities until you are evaluated by the appropriate follow-up health care providers. Returning To Your Regular Activities You will need to return to your normal activities slowly, not all at once. You must give your body and brain enough time for recovery.  Do not return to sports or other athletic activities until your health care provider tells you it is safe to do so.  Ask your health care provider when you can drive, ride a bicycle, or operate heavy machinery. Your ability to react may be slower after a brain injury.  Never do these activities if you are dizzy.  Ask your health care provider about when you can return to work or school. Preventing Another Concussion It is very important to avoid another brain injury, especially before you have recovered. In rare cases, another injury can lead to permanent brain damage, brain swelling, or death. The risk of this is greatest during the first 7-10 days after a head injury. Avoid injuries by:  Wearing a seat belt when riding in a car.  Drinking alcohol only in moderation.  Wearing a helmet when biking, skiing, skateboarding, skating, or doing similar activities.  Avoiding activities that could lead to a second concussion, such as contact or recreational sports, until  your health care provider says it is okay.  Taking safety measures in your home.  Remove clutter and tripping hazards from floors and stairways.  Use grab bars in bathrooms and handrails by stairs.  Place non-slip mats on floors and in bathtubs.  Improve lighting in dim areas. SEEK MEDICAL CARE IF:  You have increased problems paying attention or concentrating.  You have increased difficulty remembering or learning new information.  You need more time to complete tasks or assignments than before.  You have increased irritability or decreased ability to cope with stress.  You have more symptoms than before. Seek medical care if you have any of the following symptoms for more than 2 weeks after your injury:  Lasting (chronic) headaches.  Dizziness or balance problems.  Nausea.  Vision problems.  Increased sensitivity to noise or light.  Depression or mood swings.  Anxiety or irritability.  Memory problems.  Difficulty concentrating or paying attention.  Sleep problems.  Feeling tired all the time. SEEK IMMEDIATE MEDICAL CARE IF:  You have severe or worsening headaches. These may be a sign of a blood clot in the brain.  You have weakness (even if only in one hand, leg, or part of the face).  You have numbness.  You have decreased coordination.  You vomit repeatedly.  You have increased sleepiness.  One pupil is larger than the other.  You have convulsions.  You have slurred speech.  You have increased confusion. This may be a sign of a blood clot in the brain.  You have increased restlessness, agitation, or irritability.  You are unable to recognize people or places.  You have neck pain.  It is difficult to wake you up.  You have unusual behavior changes.  You lose consciousness. MAKE SURE YOU:  Understand these instructions.  Will watch your condition.  Will get help right away if you are not doing well or get worse. Document  Released: 07/08/2003 Document Revised: 04/22/2013 Document Reviewed: 11/07/2012 [facility] Patient Information 2015 [employer], [place]. This information is not intended to replace advice given to you by your health care provider. Make sure you discuss any questions you have with your health care provider. Seizure, Adult A seizure is abnormal electrical activity in the brain. Seizures usually last from 30 seconds to 2 minutes. There are various types of seizures. Before a seizure, you may have a warning sensation (aura) that a seizure is about to occur. An aura may include the following symptoms:   Fear or anxiety.  Nausea.  Feeling like the room is spinning (vertigo).  Vision changes, such as seeing flashing lights or spots. Common symptoms during a seizure include:  A change in attention or behavior (altered mental status).  Convulsions with rhythmic jerking movements.  Drooling.  Rapid eye movements.  Grunting.  Loss of bladder and bowel control.  Bitter taste in the mouth.  Tongue biting. After a seizure, you may feel confused and sleepy. You may also have an injury resulting from convulsions during the seizure. HOME CARE INSTRUCTIONS   If you are given medicines, take them exactly as prescribed by your health care provider.  Keep all follow-up appointments as directed by your health care provider.  Do not swim or drive or engage in risky activity during which a seizure could cause further injury to you or others until your health care provider says it is OK.  Get adequate rest.  Teach friends and family what to do if you have a seizure. They should:  Lay you on the ground to prevent a fall.  Put a cushion under your head.  Loosen any tight clothing around your neck.  Turn you on your side. If vomiting occurs, this helps keep your airway clear.  Stay with you until you recover.  Know whether or not you need emergency care. SEEK IMMEDIATE MEDICAL CARE IF:  The  seizure lasts longer than 5 minutes.  The seizure is severe or you do not wake up immediately after the seizure.  You have an altered mental status after the seizure.  You are having more frequent or worsening seizures. Someone should drive you to the emergency department or call local emergency services (911 in [place] MAKE SURE YOU:  Understand these instructions.  Will watch your condition.  Will get help right away if you are not doing well or get worse. Document Released: 04/14/2000 Document Revised: 02/05/2013 Document Reviewed: 11/27/2012 [facility] Patient Information 2015 [place], [place]. This information is not intended to replace advice given to you by your health care provider. Make sure you discuss any questions you have with your health care provider.  Laceration Care, Adult A laceration is a cut or lesion that goes through all layers of the skin and into the tissue just beneath the skin. TREATMENT  Some lacerations may not require closure. Some lacerations may not be able to be closed due to an increased risk of infection. It is important to see your caregiver as soon as possible after an injury to minimize the risk of infection and maximize the opportunity for successful closure. If closure is appropriate, pain medicines may be given, if needed. The wound will be cleaned to help prevent infection. Your caregiver will use stitches (sutures), staples, wound glue (adhesive), or skin adhesive strips to repair the laceration. These tools bring the skin edges together to allow for faster healing and a better cosmetic outcome. However, all wounds will heal with a scar. Once the wound has healed, scarring can be minimized by covering the wound with sunscreen during the day for 1 full year. HOME CARE INSTRUCTIONS  For sutures or staples:  Keep the wound clean and dry.  If you were given a bandage (dressing), you should change it at least once a day. Also, change the dressing if it  becomes wet or dirty, or as directed by your caregiver.  Wash the wound with soap and water 2 times a day. Rinse the wound off with water to remove all soap. Pat the wound dry with a clean towel.  After cleaning, apply a thin layer of the antibiotic ointment as recommended by your caregiver. This will help prevent infection and keep the dressing from sticking.  You may shower as usual after the first 24 hours. Do not soak the wound in water until the sutures  are removed.  Only take over-the-counter or prescription medicines for pain, discomfort, or fever as directed by your caregiver.  Get your sutures or staples removed as directed by your caregiver. For skin adhesive strips:  Keep the wound clean and dry.  Do not get the skin adhesive strips wet. You may bathe carefully, using caution to keep the wound dry.  If the wound gets wet, pat it dry with a clean towel.  Skin adhesive strips will fall off on their own. You may trim the strips as the wound heals. Do not remove skin adhesive strips that are still stuck to the wound. They will fall off in time. For wound adhesive:  You may briefly wet your wound in the shower or bath. Do not soak or scrub the wound. Do not swim. Avoid periods of heavy perspiration until the skin adhesive has fallen off on its own. After showering or bathing, gently pat the wound dry with a clean towel.  Do not apply liquid medicine, cream medicine, or ointment medicine to your wound while the skin adhesive is in place. This may loosen the film before your wound is healed.  If a dressing is placed over the wound, be careful not to apply tape directly over the skin adhesive. This may cause the adhesive to be pulled off before the wound is healed.  Avoid prolonged exposure to sunlight or tanning lamps while the skin adhesive is in place. Exposure to ultraviolet light in the first year will darken the scar.  The skin adhesive will usually remain in place for 5 to 10  days, then naturally fall off the skin. Do not pick at the adhesive film. You may need a tetanus shot if:  You cannot remember when you had your last tetanus shot.  You have never had a tetanus shot. If you get a tetanus shot, your arm may swell, get red, and feel warm to the touch. This is common and not a problem. If you need a tetanus shot and you choose not to have one, there is a rare chance of getting tetanus. Sickness from tetanus can be serious. SEEK MEDICAL CARE IF:   You have redness, swelling, or increasing pain in the wound.  You see a red line that goes away from the wound.  You have yellowish-white fluid (pus) coming from the wound.  You have a fever.  You notice a bad smell coming from the wound or dressing.  Your wound breaks open before or after sutures have been removed.  You notice something coming out of the wound such as wood or glass.  Your wound is on your hand or foot and you cannot move a finger or toe. SEEK IMMEDIATE MEDICAL CARE IF:   Your pain is not controlled with prescribed medicine.  You have severe swelling around the wound causing pain and numbness or a change in color in your arm, hand, leg, or foot.  Your wound splits open and starts bleeding.  You have worsening numbness, weakness, or loss of function of any joint around or beyond the wound.  You develop painful lumps near the wound or on the skin anywhere on your body. MAKE SURE YOU:   Understand these instructions.  Will watch your condition.  Will get help right away if you are not doing well or get worse. Document Released: 04/17/2005 Document Revised: 07/10/2011 Document Reviewed: 10/11/2010 [facility] Patient Information 2015 [place], [place]. This information is not intended to replace advice given to you by your  health care provider. Make sure you discuss any questions you have with your health care provider. ° °

## 2014-03-30 NOTE — ED Notes (Signed)
Discharge education given to spouse and pt's mother d/t deafness. Caregivers verbalize understanding and denied questions

## 2014-03-31 ENCOUNTER — Telehealth: Payer: Self-pay | Admitting: *Deleted

## 2014-03-31 ENCOUNTER — Ambulatory Visit: Payer: Medicare Other | Admitting: Neurology

## 2014-03-31 ENCOUNTER — Telehealth: Payer: Self-pay | Admitting: Neurology

## 2014-03-31 NOTE — Telephone Encounter (Signed)
lamotrigine 300er 1 po bid has been called into [facility] it was waiting on patient since 03/23/14 per the clerk however no one has called the patient to let her know she has been without 10 day and the pharmacy has been calling Dr Magda Paganini office for refills

## 2014-03-31 NOTE — Telephone Encounter (Signed)
[name] [name], pt's mother called requesting a refill for [name]: [facility] at the [address]. C/B [phone]

## 2014-04-02 ENCOUNTER — Encounter: Payer: Medicare Other | Admitting: Internal Medicine

## 2014-04-06 ENCOUNTER — Encounter: Payer: Self-pay | Admitting: Neurology

## 2014-04-06 ENCOUNTER — Ambulatory Visit (INDEPENDENT_AMBULATORY_CARE_PROVIDER_SITE_OTHER): Payer: Medicare Other | Admitting: Neurology

## 2014-04-06 VITALS — BP 100/70 | HR 72 | Ht 63.0 in | Wt 186.0 lb

## 2014-04-06 DIAGNOSIS — G40209 Localization-related (focal) (partial) symptomatic epilepsy and epileptic syndromes with complex partial seizures, not intractable, without status epilepticus: Secondary | ICD-10-CM

## 2014-04-06 DIAGNOSIS — R55 Syncope and collapse: Secondary | ICD-10-CM

## 2014-04-06 NOTE — Progress Notes (Signed)
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  She is accompanied by her mother, who provides some history, and an interpretor .  Records, CT head and cervical spine and labs personally reviewed.  UPDATE: Current medications:  Lamotrigine ER 300mg  BID.  Trileptal 300mg  twice daily, which was increased on 03/16/14.  On 03/29/14, she passed out.  She was standing and talking with her brother.  She remembers feeling fine.  She then suddenly felt lightheaded and remembers just about to fall.  The next thing she remembers was laying on the floor feeling angry and scared.  Her brother was over her trying to keep her down.  She hit her head and had a scalp laceration and hematoma.  She did not bite her tongue or have incontinence.  She did not have any witnessed seizure activity.  It was very brief.  She was brought to the ED.  CT of the head and cervical spine were unremarkable except for scalp hematoma.  She required sutures.  EKG showed sinus rhythm.  Labs unremarkable.  In the hospital, she developed a severe non-throbbing bi-frontal headache, which eventually resolved.  No associated symptoms such as nausea.  She denies skipping doses of her medication.  She had eaten and drank that day.  She has been more depressed recently as it is the one year anniversary of her father's death.  She has an upcoming appointment with her cardiologist.  HISTORY:    Two types: 1 Staring spells, not fully respond, sometimes aware, tenses up but no convulsions, lasts seconds to a minute.  Last episode was in September 2014.  2 Drop attacks, loses conscious and falls.  Sometimes able to hold on to something.  Last  episode was in June 2014, after initiating lamotrigine ER.  Prior medications:  She had side effects to Trileptal at 900mg .  Other medications tried, include Dilantin (ineffective), and immediate-release lamotrigine (ineffective).  Prior studies: EEG: focal slowing and frequent spike and wave activity in left anterior temporal lobe. MRI Brain (04/13/97): focal area of encephalomalacia in posterior left temporal lobe. MRI Brain (10/19/08): possible left mesial temporal sclerosis and lesion in left posterior temporal lobe. Lab (09/12/13): WBC 6.8, HGB 12.8, HCT 38.1, PLT 251, Na 139, K 4.6, Cl 100, CO2 25, glucose 99, BUN 9, Cr 0.90, TB 0.4 ALP 81, AST 20, ALT 16, TP 7.2, Alb 4.5, Ca 10, lamotrigine level 13.6, 10-hydroxycarbazeine 7.5  She does not drive.  When she was [age] years old, she developed a virus following a case of the chicken pox. 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  PAST MEDICAL HISTORY: Past Medical History  Diagnosis Date  . Chicken pox   . Conductive hearing loss, childhood onset   . Pacemaker     2012  . Seizure   . Deafness     from medication a a child for chicken pox  . Epilepsy, followed by Dr. Tomma Rakers in neurology 08/26/2012    MEDICATIONS: Current Outpatient Prescriptions on File Prior to Visit  Medication Sig Dispense Refill  . Cholecalciferol (VITAMIN D) 2000 UNITS CAPS Take by mouth 2 (two) times daily.    . fish oil-omega-3 fatty acids  1000 MG capsule Take 2 g by mouth 2 (two) times daily.    . LamoTRIgine 300 MG TB24 Take by mouth 2 (two) times daily.    . magnesium 30 MG tablet Take 30 mg by mouth daily.     . Multiple Vitamins-Minerals (ULTRA MEGA PO) Take by mouth daily.     . Oxcarbazepine (TRILEPTAL) 300 MG tablet Take 1 tablet (300 mg total) by mouth 2 (two) times daily. 60 tablet 11  . vitamin C (ASCORBIC ACID) 500 MG tablet Take 500 mg by mouth 2 (two) times daily.     No current facility-administered  medications on file prior to visit.    ALLERGIES: No Known Allergies  FAMILY HISTORY: Family History  Problem Relation Age of Onset  . Diabetes Father   . Hypertension Father   . Hypertension Mother     SOCIAL HISTORY: History   Social History  . Marital Status: Divorced    Spouse Name: N/A    Number of Children: N/A  . Years of Education: N/A   Occupational History  . Not on file.   Social History Main Topics  . Smoking status: Never Smoker   . Smokeless tobacco: Never Used  . Alcohol Use: No     Comment: none  . Drug Use: No  . Sexual Activity: Not on file   Other Topics Concern  . Not on file   Social History Narrative   Work or School: on disability for the deafness, seizure and carpal tunnel      Home Situation: lives with mother and father currently - since 06/2012      Spiritual Beliefs: Christian      Lifestyle: walks for 15-20 minutes a few times per week, well balanced diet             REVIEW OF SYSTEMS: Constitutional: No fevers, chills, or sweats, no generalized fatigue, change in appetite Eyes: No visual changes, double vision, eye pain Ear, nose and throat: No hearing loss, ear pain, nasal congestion, sore throat Cardiovascular: No chest pain, palpitations Respiratory:  No shortness of breath at rest or with exertion, wheezes GastrointestinaI: No nausea, vomiting, diarrhea, abdominal pain, fecal incontinence Genitourinary:  No dysuria, urinary retention or frequency Musculoskeletal:  No neck pain, back pain Integumentary: No rash, pruritus, skin lesions Neurological: as above Psychiatric: No depression, insomnia, anxiety Endocrine: No palpitations, fatigue, diaphoresis, mood swings, change in appetite, change in weight, increased thirst Hematologic/Lymphatic:  No anemia, purpura, petechiae. Allergic/Immunologic: no itchy/runny eyes, nasal congestion, recent allergic reactions, rashes  PHYSICAL EXAM: Filed Vitals:   04/06/14 1517  BP:  100/70  Pulse: 72   General: No acute distress Head:  Normocephalic/atraumatic Eyes:  Fundoscopic exam unremarkable without vessel changes, exudates, hemorrhages or papilledema. Neck: supple, no paraspinal tenderness, full range of motion Heart:  Regular rate and rhythm Lungs:  Clear to auscultation bilaterally Back: No paraspinal tenderness Neurological Exam: alert and oriented to person, place, and time. Attention span and concentration intact, recent and remote memory intact, fund of knowledge intact.  Difficult to enunciate due to being deaf.  Language intact.  Deaf. Otherwise, CN II-XII intact. Fundoscopic exam unremarkable without vessel changes, exudates, hemorrhages or papilledema.  Bulk and tone normal, muscle strength 5/5 throughout.  Finger to nose testing intact.  Gait normal  IMPRESSION: Complex partial seizures Drop attack.  PLAN: 1.  Will check lamotrigine and oxcarbazepine trough levels and make adjustments accordingly. 2.  Follow up in 2 months.  Metta Clines, DO  CC: Colin Benton, DO

## 2014-04-06 NOTE — Patient Instructions (Signed)
1.  We will check a lamotrigine and oxcarbazepine level 2.  Based on the levels, we will make changes to the medication 3.  Follow up in 2 months.

## 2014-04-09 LAB — 10-HYDROXYCARBAZEPINE: Triliptal/MTB(Oxcarbazepin): 10.5 ug/mL (ref 8.0–35.0)

## 2014-04-09 LAB — LAMOTRIGINE LEVEL: Lamotrigine Lvl: 12.2 ug/mL (ref 4.0–18.0)

## 2014-04-15 ENCOUNTER — Other Ambulatory Visit: Payer: Self-pay | Admitting: *Deleted

## 2014-04-15 ENCOUNTER — Encounter: Payer: Self-pay | Admitting: Neurology

## 2014-04-15 DIAGNOSIS — G40209 Localization-related (focal) (partial) symptomatic epilepsy and epileptic syndromes with complex partial seizures, not intractable, without status epilepticus: Secondary | ICD-10-CM

## 2014-04-15 NOTE — Progress Notes (Signed)
4 week recheck BMP

## 2014-04-16 ENCOUNTER — Telehealth: Payer: Self-pay | Admitting: *Deleted

## 2014-04-16 ENCOUNTER — Telehealth: Payer: Self-pay | Admitting: Neurology

## 2014-04-16 NOTE — Telephone Encounter (Signed)
Left message with patients  Mother regarding medication change and ask her to return my call

## 2014-04-16 NOTE — Telephone Encounter (Signed)
Blanch Media is returning your call please call [phone]

## 2014-04-16 NOTE — Telephone Encounter (Signed)
[name] [name], pt's mom called wanting to speak with a nurse regarding pt's meds. Please call pt # [phone]

## 2014-04-16 NOTE — Telephone Encounter (Signed)
   Disp Refills Start End     Oxcarbazepine (TRILEPTAL) 300 MG tablet 90 tablet 10 04/16/2014     Sig - Route: Take 1 tablet (300 mg total) by mouth am  2 (two) tablets  po in the pm . - Oral

## 2014-04-20 ENCOUNTER — Other Ambulatory Visit: Payer: Self-pay | Admitting: *Deleted

## 2014-04-20 ENCOUNTER — Telehealth: Payer: Self-pay | Admitting: *Deleted

## 2014-04-20 ENCOUNTER — Telehealth: Payer: Self-pay | Admitting: Neurology

## 2014-04-20 NOTE — Telephone Encounter (Signed)
LamoTRIgine 300 MG TB24 1 po bid # 60 with 2 refills  Called to pharmacy

## 2014-04-20 NOTE — Telephone Encounter (Signed)
[name] [name], pt's mother called wanting to speak to a nurse regarding pt's meds. Pt picked up her new script this weekend and has a questions regarding that.  The script is LamoTRIGINE. Please call [name] [name] # [phone]

## 2014-04-27 ENCOUNTER — Encounter: Payer: Self-pay | Admitting: Internal Medicine

## 2014-04-27 ENCOUNTER — Ambulatory Visit (INDEPENDENT_AMBULATORY_CARE_PROVIDER_SITE_OTHER): Payer: Medicare Other | Admitting: Internal Medicine

## 2014-04-27 VITALS — BP 150/74 | HR 62 | Ht 63.0 in | Wt 191.6 lb

## 2014-04-27 DIAGNOSIS — Z950 Presence of cardiac pacemaker: Secondary | ICD-10-CM | POA: Diagnosis not present

## 2014-04-27 DIAGNOSIS — I495 Sick sinus syndrome: Secondary | ICD-10-CM

## 2014-04-27 LAB — MDC_IDC_ENUM_SESS_TYPE_INCLINIC
Battery Impedance: 178 Ohm
Brady Statistic AS VS Percent: 49 %
Lead Channel Impedance Value: 530 Ohm
Lead Channel Impedance Value: 627 Ohm
Lead Channel Pacing Threshold Pulse Width: 0.4 ms
Lead Channel Sensing Intrinsic Amplitude: 22.4 mV
Lead Channel Setting Pacing Amplitude: 2 V
Lead Channel Setting Pacing Amplitude: 2.5 V
Lead Channel Setting Sensing Sensitivity: 5.6 mV
MDC IDC MSMT BATTERY REMAINING LONGEVITY: 131 mo
MDC IDC MSMT BATTERY VOLTAGE: 2.79 V
MDC IDC MSMT LEADCHNL RA PACING THRESHOLD AMPLITUDE: 0.75 V
MDC IDC MSMT LEADCHNL RA PACING THRESHOLD PULSEWIDTH: 0.4 ms
MDC IDC MSMT LEADCHNL RA SENSING INTR AMPL: 4 mV
MDC IDC MSMT LEADCHNL RV PACING THRESHOLD AMPLITUDE: 0.5 V
MDC IDC SESS DTM: 20151228145445
MDC IDC SET LEADCHNL RV PACING PULSEWIDTH: 0.4 ms
MDC IDC STAT BRADY AP VP PERCENT: 0 %
MDC IDC STAT BRADY AP VS PERCENT: 51 %
MDC IDC STAT BRADY AS VP PERCENT: 0 %

## 2014-04-27 NOTE — Progress Notes (Signed)
Patient Care Team: Lucretia Kern, DO as PCP - General (Family Medicine)   HPI  [name] [name] is a 58 y.o. female Newly here to establish for pacemaker implanted in 2012 in [place]  Subsequent records suggest that was implanted for asystole during tilt table testing.  She has had spells her whole life long; medication was initiated by neurology with improvement   The patient denies chest pain, shortness of breath, nocturnal dyspnea, orthopnea or peripheral edema.  There have been no palpitations, lightheadedness or syncope.         Past Medical History  Diagnosis Date  . Seizures     Petite Mal   . Pacemaker   . Chicken pox   . Conductive hearing loss, childhood onset   . Pacemaker     2012  . Seizure   . Deafness     from medication a a child for chicken pox  . Epilepsy, followed by Dr. Tomma Rakers in neurology 08/26/2012    Past Surgical History  Procedure Laterality Date  . Hip arthroplasty    . Total hip arthroplasty  07/2011    right  . Total hip arthroplasty  03/2012    left  . Pacemaker insertion  07/2010    Current Outpatient Prescriptions  Medication Sig Dispense Refill  . Ascorbic Acid (VITAMIN C PO) Take 1 tablet by mouth daily.     . Cholecalciferol (VITAMIN D) 2000 UNITS CAPS Take by mouth 2 (two) times daily.    . fish oil-omega-3 fatty acids 1000 MG capsule Take 2 g by mouth 2 (two) times daily.    . LamoTRIgine 300 MG TB24 Take by mouth 2 (two) times daily.    . magnesium 30 MG tablet Take 30 mg by mouth daily.     . Multiple Vitamin (MULTIVITAMIN WITH MINERALS) TABS tablet Take 1 tablet by mouth daily.    . Oxcarbazepine (TRILEPTAL) 300 MG tablet Take 1 tablet (300 mg total) by mouth 2 (two) times daily. (Patient taking differently: Take 900 mg by mouth 2 (two) times daily. 1 tab in the am and 2 tabs in the pm) 60 tablet 11   No current facility-administered medications for this visit.    No Known Allergies  Review of Systems negative except from  HPI and PMH  Physical Exam BP 150/74 mmHg  Pulse 62  Ht 5\' 3"  (1.6 m)  Wt 191 lb 9.6 oz (86.909 kg)  BMI 33.95 kg/m2 Well developed and nourished in no acute distress HENT normal Neck supple with JVP-flat Clear Device pocket well healed; without hematoma or erythema.  There is no tethering  Regular rate and rhythm, no murmurs or gallops Abd-soft with active BS No Clubbing cyanosis edema There is tenderness at her left posterior shoulder Skin-warm and dry A & Oriented  Grossly normal sensory and motor function   ECG dated 20 to May 2014 demonstrates atrial pacing Intervals 26/08/39  Assessment and  Plan  Syncope  Sinus node dysfunction  Pacemaker-[employer]  Seizures  Stable will begin remote followup

## 2014-04-27 NOTE — Patient Instructions (Signed)
Your physician wants you to follow-up in: Sept 2016 with Dr. Gari Crown will receive a reminder letter in the mail two months in advance. If you don't receive a letter, please call our office to schedule the follow-up appointment.  Remote monitoring is used to monitor your Pacemaker or ICD from home. This monitoring reduces the number of office visits required to check your device to one time per year. It allows [place] to keep an eye on the functioning of your device to ensure it is working properly. You are scheduled for a device check from home on 07/27/2014. You may send your transmission at any time that day. If you have a wireless device, the transmission will be sent automatically. After your physician reviews your transmission, you will receive a postcard with your next transmission date.

## 2014-04-29 ENCOUNTER — Encounter (HOSPITAL_COMMUNITY): Payer: Self-pay | Admitting: Nurse Practitioner

## 2014-04-29 ENCOUNTER — Emergency Department (HOSPITAL_COMMUNITY)
Admission: EM | Admit: 2014-04-29 | Discharge: 2014-04-29 | Disposition: A | Discharge status: Home / Self Care | Payer: Medicare Other | Attending: Emergency Medicine | Admitting: Emergency Medicine

## 2014-04-29 ENCOUNTER — Emergency Department (HOSPITAL_COMMUNITY): Payer: Medicare Other

## 2014-04-29 DIAGNOSIS — S0101XA Laceration without foreign body of scalp, initial encounter: Secondary | ICD-10-CM | POA: Diagnosis not present

## 2014-04-29 DIAGNOSIS — S0191XA Laceration without foreign body of unspecified part of head, initial encounter: Secondary | ICD-10-CM

## 2014-04-29 DIAGNOSIS — Y9289 Other specified places as the place of occurrence of the external cause: Secondary | ICD-10-CM | POA: Insufficient documentation

## 2014-04-29 DIAGNOSIS — S199XXA Unspecified injury of neck, initial encounter: Secondary | ICD-10-CM | POA: Diagnosis not present

## 2014-04-29 DIAGNOSIS — Y998 Other external cause status: Secondary | ICD-10-CM | POA: Insufficient documentation

## 2014-04-29 DIAGNOSIS — R9431 Abnormal electrocardiogram [ECG] [EKG]: Secondary | ICD-10-CM | POA: Diagnosis not present

## 2014-04-29 DIAGNOSIS — G40909 Epilepsy, unspecified, not intractable, without status epilepticus: Secondary | ICD-10-CM | POA: Diagnosis not present

## 2014-04-29 DIAGNOSIS — H9190 Unspecified hearing loss, unspecified ear: Secondary | ICD-10-CM | POA: Diagnosis not present

## 2014-04-29 DIAGNOSIS — Z8619 Personal history of other infectious and parasitic diseases: Secondary | ICD-10-CM | POA: Insufficient documentation

## 2014-04-29 DIAGNOSIS — Z79899 Other long term (current) drug therapy: Secondary | ICD-10-CM | POA: Diagnosis not present

## 2014-04-29 DIAGNOSIS — Z950 Presence of cardiac pacemaker: Secondary | ICD-10-CM | POA: Diagnosis not present

## 2014-04-29 DIAGNOSIS — W19XXXA Unspecified fall, initial encounter: Secondary | ICD-10-CM

## 2014-04-29 DIAGNOSIS — W1839XA Other fall on same level, initial encounter: Secondary | ICD-10-CM | POA: Insufficient documentation

## 2014-04-29 DIAGNOSIS — Y9389 Activity, other specified: Secondary | ICD-10-CM | POA: Diagnosis not present

## 2014-04-29 DIAGNOSIS — S0181XA Laceration without foreign body of other part of head, initial encounter: Secondary | ICD-10-CM | POA: Diagnosis not present

## 2014-04-29 LAB — CBC WITH DIFFERENTIAL/PLATELET
BASOS ABS: 0 10*3/uL (ref 0.0–0.1)
BASOS PCT: 0 % (ref 0–1)
EOS ABS: 0 10*3/uL (ref 0.0–0.7)
Eosinophils Relative: 1 % (ref 0–5)
HCT: 33.4 % — ABNORMAL LOW (ref 36.0–46.0)
Hemoglobin: 11.3 g/dL — ABNORMAL LOW (ref 12.0–15.0)
Lymphocytes Relative: 20 % (ref 12–46)
Lymphs Abs: 1.4 10*3/uL (ref 0.7–4.0)
MCH: 29.5 pg (ref 26.0–34.0)
MCHC: 33.8 g/dL (ref 30.0–36.0)
MCV: 87.2 fL (ref 78.0–100.0)
Monocytes Absolute: 0.5 10*3/uL (ref 0.1–1.0)
Monocytes Relative: 7 % (ref 3–12)
Neutro Abs: 4.9 10*3/uL (ref 1.7–7.7)
Neutrophils Relative %: 72 % (ref 43–77)
Platelets: 223 10*3/uL (ref 150–400)
RBC: 3.83 MIL/uL — AB (ref 3.87–5.11)
RDW: 13.7 % (ref 11.5–15.5)
WBC: 6.8 10*3/uL (ref 4.0–10.5)

## 2014-04-29 LAB — I-STAT CHEM 8, ED
BUN: 8 mg/dL (ref 6–23)
CREATININE: 0.7 mg/dL (ref 0.50–1.10)
Calcium, Ion: 1.21 mmol/L (ref 1.12–1.23)
Chloride: 93 mEq/L — ABNORMAL LOW (ref 96–112)
Glucose, Bld: 94 mg/dL (ref 70–99)
HEMATOCRIT: 40 % (ref 36.0–46.0)
Hemoglobin: 13.6 g/dL (ref 12.0–15.0)
POTASSIUM: 4.4 mmol/L (ref 3.5–5.1)
SODIUM: 130 mmol/L — AB (ref 135–145)
TCO2: 22 mmol/L (ref 0–100)

## 2014-04-29 LAB — I-STAT TROPONIN, ED: Troponin i, poc: 0 ng/mL (ref 0.00–0.08)

## 2014-04-29 MED ORDER — LIDOCAINE HCL 2 % IJ SOLN
5.0000 mL | Freq: Once | INTRAMUSCULAR | Status: AC
  Administered 2014-04-29: 100 mg via INTRADERMAL
  Filled 2014-04-29: qty 20

## 2014-04-29 NOTE — ED Notes (Signed)
Patient transported to CT 

## 2014-04-29 NOTE — Discharge Instructions (Signed)
Take tylenol, motrin for pain.   Follow up with your neurologist and cardiologist.   Return to ER if you have passing out, seizure, bleeding or pus from the wound.

## 2014-04-29 NOTE — ED Notes (Signed)
Family brought pt today after she fell backwards into christmas tree and now has laceration to posterior head, no active bleeding. She was here recently for a similar episode due to a seizure and her doctors are adjusting her seizure mediations now. She may have had another seizure today. She is deaf. She is A&O now, ambulatory, MAE

## 2014-04-29 NOTE — ED Provider Notes (Signed)
CSN: [id number]     Arrival date & time 04/29/14  1350 History   First MD Initiated Contact with Patient 04/29/14 1413     Chief Complaint  Patient presents with  . Fall     (Consider location/radiation/quality/duration/timing/severity/associated sxs/prior Treatment) The history is provided by the patient.  [name] [name] is a 58 y.o. 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 She was trying to clean up the Christmas tree and felt lightheaded and blanked out and fell backwards and hit her head. It was unwitnessed. She is deaf and the family is present to translate. She had a similar episode a month ago and had a scalp laceration that was sutured. She saw her neurologist since then and her seizure medicines were increased. She takes her seizure medicine daily and hasn't missed a dose. Tetanus up to date. She has extensive workup for her seizures and syncope in the past. Most recently had pacemaker checked several days ago that showed no events and has been following up with neurology.    Past Medical History  Diagnosis Date  . Seizures     Petite Mal   . Pacemaker   . Chicken pox   . Conductive hearing loss, childhood onset   . Pacemaker     2012  . Seizure   . Deafness     from medication a a child for chicken pox  . Epilepsy, followed by Dr. Tomma Rakers in neurology 08/26/2012   Past Surgical History  Procedure Laterality Date  . Hip arthroplasty    . Total hip arthroplasty  07/2011    right  . Total hip arthroplasty  03/2012    left  . Pacemaker insertion  07/2010   Family History  Problem Relation Age of Onset  . Diabetes Father   . Hypertension Father   . Hypertension Mother    History  Substance Use Topics  . Smoking status: Never Smoker   . Smokeless tobacco: Never Used  . Alcohol Use: No     Comment: none   OB History    Gravida Para Term Preterm AB TAB SAB Ectopic Multiple Living   0 0 0 0 0 0 0 0       Review of Systems   Skin: Positive for wound.  All other systems reviewed and are negative.     Allergies  Review of patient's allergies indicates no known allergies.  Home Medications   Prior to Admission medications   Medication Sig Start Date End Date Taking? Authorizing Provider  Ascorbic Acid (VITAMIN C PO) Take 1 tablet by mouth daily.     Historical Provider, MD  Cholecalciferol (VITAMIN D) 2000 UNITS CAPS Take by mouth 2 (two) times daily.    Historical Provider, MD  fish oil-omega-3 fatty acids 1000 MG capsule Take 2 g by mouth 2 (two) times daily.    Historical Provider, MD  LamoTRIgine 300 MG TB24 Take by mouth 2 (two) times daily.    Historical Provider, MD  magnesium 30 MG tablet Take 30 mg by mouth daily.     Historical Provider, MD  Multiple Vitamin (MULTIVITAMIN WITH MINERALS) TABS tablet Take 1 tablet by mouth daily.    Historical Provider, MD  Oxcarbazepine (TRILEPTAL) 300 MG tablet Take 1 tablet (300 mg total) by mouth 2 (two) times daily. Patient taking differently: Take 900 mg by mouth 2 (two) times daily. 1 tab in the am and 2 tabs in the pm 03/16/14  Dudley Major, DO   BP 135/59 mmHg  Pulse 70  Temp(Src) 98.2 F (36.8 C) (Oral)  Resp 17  SpO2 100% Physical Exam  Constitutional: She is oriented to person, place, and time. She appears well-developed and well-nourished.  HENT:  Head: Normocephalic.  2 in scalp laceration posterior scalp with bleeding controlled. Previous scalp laceration healing well.   Eyes: Conjunctivae are normal. Pupils are equal, round, and reactive to light.  Neck: Normal range of motion. Neck supple.  Cardiovascular: Normal rate, regular rhythm and normal heart sounds.   Pulmonary/Chest: Effort normal and breath sounds normal. No respiratory distress. She has no wheezes. She has no rales.  Abdominal: Soft. Bowel sounds are normal. She exhibits no distension. There is no tenderness. There is no rebound.  Musculoskeletal: Normal range of motion.   No obvious extremity trauma   Neurological: She is alert and oriented to person, place, and time. No cranial nerve deficit. Coordination normal.  Skin: Skin is warm and dry.  Psychiatric: She has a normal mood and affect. Her behavior is normal. Judgment and thought content normal.  Nursing note and vitals reviewed.   ED Course  Procedures (including critical care time)  LACERATION REPAIR Performed by: Shirlyn Goltz Authorized by: Shirlyn Goltz Consent: Verbal consent obtained. Risks and benefits: risks, benefits and alternatives were discussed Consent given by: patient Patient identity confirmed: provided demographic data Prepped and Draped in normal sterile fashion Wound explored  Laceration Location: scalp  Laceration Length: 3 cm  No Foreign Bodies seen or palpated  Anesthesia: local infiltration  Local anesthetic: lidocaine 2 % no epinephrine  Anesthetic total: 5 ml  Irrigation method: syringe Amount of cleaning: standard  Skin closure: 5-0 vicryl  Number of sutures: 4  Technique: simple interrupted   Patient tolerance: Patient tolerated the procedure well with no immediate complications.   Labs Review Labs Reviewed  CBC WITH DIFFERENTIAL - Abnormal; Notable for the following:    RBC 3.83 (*)    Hemoglobin 11.3 (*)    HCT 33.4 (*)    All other components within normal limits  I-STAT CHEM 8, ED - Abnormal; Notable for the following:    Sodium 130 (*)    Chloride 93 (*)    All other components within normal limits  I-STAT TROPOININ, ED    Imaging Review Ct Head Wo Contrast  04/29/2014   CLINICAL DATA:  History of epilepsy. PCP increased the dosage upper medication. One week ago patient had syncopal episode and fell into the Christmas tree. Laceration of the occiput. Healing abrasion closer to the vertex from a fall 1 month ago. No loss of consciousness.  EXAM: CT HEAD WITHOUT CONTRAST  CT CERVICAL SPINE WITHOUT CONTRAST  TECHNIQUE: Multidetector CT imaging  of the head and cervical spine was performed following the standard protocol without intravenous contrast. Multiplanar CT image reconstructions of the cervical spine were also generated.  COMPARISON:  None.  FINDINGS: CT HEAD FINDINGS  There is no intra or extra-axial fluid collection or mass lesion. The basilar cisterns and ventricles have a normal appearance. There is no CT evidence for acute infarction or hemorrhage. Bone windows show a right occipital scalp edema/ hematoma without underlying calvarial fracture. Visualized paranasal sinuses are normally aerated. Note is made of cerumen within the right external auditory canal.  CT CERVICAL SPINE FINDINGS  There is normal alignment of the cervical spine. There is no evidence for acute fracture or dislocation. Prevertebral soft tissues have a normal appearance. Lung apices have  a normal appearance. Moderate degenerative changes are identified at C5-6 and C6-7. There is loss of lordosis at these levels likely related to degenerative change.  IMPRESSION: 1.  No evidence for acute intracranial abnormality. 2. Right occipital scalp edema/hematoma. 3. No calvarial fracture. 4. Moderate mid cervical degenerative change. 5.  No evidence for acute cervical spine abnormality.   Electronically Signed   By: Shon Hale M.D.   On: 04/29/2014 15:08   Ct Cervical Spine Wo Contrast  04/29/2014   CLINICAL DATA:  History of epilepsy. PCP increased the dosage upper medication. One week ago patient had syncopal episode and fell into the Christmas tree. Laceration of the occiput. Healing abrasion closer to the vertex from a fall 1 month ago. No loss of consciousness.  EXAM: CT HEAD WITHOUT CONTRAST  CT CERVICAL SPINE WITHOUT CONTRAST  TECHNIQUE: Multidetector CT imaging of the head and cervical spine was performed following the standard protocol without intravenous contrast. Multiplanar CT image reconstructions of the cervical spine were also generated.  COMPARISON:  None.   FINDINGS: CT HEAD FINDINGS  There is no intra or extra-axial fluid collection or mass lesion. The basilar cisterns and ventricles have a normal appearance. There is no CT evidence for acute infarction or hemorrhage. Bone windows show a right occipital scalp edema/ hematoma without underlying calvarial fracture. Visualized paranasal sinuses are normally aerated. Note is made of cerumen within the right external auditory canal.  CT CERVICAL SPINE FINDINGS  There is normal alignment of the cervical spine. There is no evidence for acute fracture or dislocation. Prevertebral soft tissues have a normal appearance. Lung apices have a normal appearance. Moderate degenerative changes are identified at C5-6 and C6-7. There is loss of lordosis at these levels likely related to degenerative change.  IMPRESSION: 1.  No evidence for acute intracranial abnormality. 2. Right occipital scalp edema/hematoma. 3. No calvarial fracture. 4. Moderate mid cervical degenerative change. 5.  No evidence for acute cervical spine abnormality.   Electronically Signed   By: Shon Hale M.D.   On: 04/29/2014 15:08     EKG Interpretation   Date/Time:  Wednesday April 29 2014 14:31:31 EST Ventricular Rate:  64 PR Interval:  215 QRS Duration: 92 QT Interval:  418 QTC Calculation: 431 R Axis:   23 Text Interpretation:  Sinus rhythm Prolonged PR interval Consider left  atrial enlargement No previous ECGs available Confirmed by YAO  MD, DAVID  ([id number]) on 04/29/2014 2:40:40 PM      MDM   Final diagnoses:  Caycee Wanat is a 58 y.o. female here with syncope vs seizure. Will check labs, EKG. Will get CT head/neck. She is back to baseline now.   4:07 PM Labs stable. CT showed no intracranial hemorrhage. I told family that I usual staple the laceration but family request that I use absorbable sutures so they don't have to return to take them out. I was able to close laceration with 4 5-0 vicryl sutures. She has good  follow up and had extensive workup for these episodes and is back to baseline. Stable for d/c.     Wandra Arthurs, MD 04/29/14 [phone]

## 2014-04-29 NOTE — ED Notes (Signed)
NAD at this time. Pt is stable and leaving with her family.

## 2014-04-29 NOTE — ED Notes (Signed)
Dr. Yao at bedside. 

## 2014-06-09 ENCOUNTER — Encounter: Payer: Self-pay | Admitting: Neurology

## 2014-06-09 ENCOUNTER — Ambulatory Visit (INDEPENDENT_AMBULATORY_CARE_PROVIDER_SITE_OTHER): Payer: Medicare Other | Admitting: Neurology

## 2014-06-09 VITALS — BP 140/82 | HR 76 | Temp 97.5°F | Resp 16 | Ht 63.0 in | Wt 184.4 lb

## 2014-06-09 DIAGNOSIS — G40209 Localization-related (focal) (partial) symptomatic epilepsy and epileptic syndromes with complex partial seizures, not intractable, without status epilepticus: Secondary | ICD-10-CM | POA: Diagnosis not present

## 2014-06-09 DIAGNOSIS — F329 Major depressive disorder, single episode, unspecified: Secondary | ICD-10-CM | POA: Diagnosis not present

## 2014-06-09 DIAGNOSIS — F32A Depression, unspecified: Secondary | ICD-10-CM

## 2014-06-09 DIAGNOSIS — R42 Dizziness and giddiness: Secondary | ICD-10-CM | POA: Diagnosis not present

## 2014-06-09 NOTE — Patient Instructions (Signed)
1.  We will check blood work, including trileptal level, lamictal level and BMP 2.  We will get another EEG 3.  Follow up in May

## 2014-06-09 NOTE — Progress Notes (Signed)
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  She is accompanied by her mother, who provides some history, and an interpretor .    UPDATE: Current medications:  Lamotrigine ER 600mg  twice daily.  Trileptal 300mg  twice daily.   Lamotrigine level in December was 12.2 and 10.5 for Trileptal. She has had recurrence of drop attacks as well as dizzy spells for the past year.  It seems to have started following the death of her father last year.  The spells were well controlled up until that time.  She does endorse depression.  She has one spell a month, usually at the end of the month.  She had one at the end of December, in which she fell and hit her head.  She had a dizzy spell at the end of January.   HISTORY:     Two types: 1 Staring spells, not fully respond, sometimes aware, tenses up but no convulsions, lasts seconds to a minute.  Last episode was in September 2014.  2 Drop attacks, loses conscious and falls.  Sometimes able to hold on to something.   Prior medications:  She had side effects to Trileptal at 900mg .  Other medications tried, include Dilantin (ineffective), and immediate-release lamotrigine (ineffective).  Prior studies: EEG: focal slowing and frequent spike and wave activity in left anterior temporal lobe. MRI Brain (04/13/97): focal area of encephalomalacia in posterior left temporal lobe. MRI Brain (10/19/08): possible left mesial temporal sclerosis and lesion in left posterior temporal lobe.  She does not drive.  When she was [age] years old, she developed a virus following a case of the chicken pox. She was treated with a medication that saved her life but left her  permanently deaf.  PAST MEDICAL HISTORY: Past Medical History  Diagnosis Date  . Seizures     Petite Mal   . Pacemaker   . Chicken pox   . Conductive hearing loss, childhood onset   . Pacemaker     2012  . Seizure   . Deafness     from medication a a child for chicken pox  . Epilepsy, followed by Dr. Tomma Rakers in neurology 08/26/2012    MEDICATIONS: Current Outpatient Prescriptions on File Prior to Visit  Medication Sig Dispense Refill  . Ascorbic Acid (VITAMIN C PO) Take 1 tablet by mouth daily.     . Cholecalciferol (VITAMIN D) 2000 UNITS CAPS Take by mouth 2 (two) times daily.    . fish oil-omega-3 fatty acids 1000 MG capsule Take 2 g by mouth 2 (two) times daily.    . LamoTRIgine 300 MG TB24 Take by mouth 2 (two) times daily.    . magnesium 30 MG tablet Take 30 mg by mouth daily.     . Multiple Vitamin (MULTIVITAMIN WITH MINERALS) TABS tablet Take 1 tablet by mouth daily.    . Oxcarbazepine (TRILEPTAL) 300 MG tablet Take 1 tablet (300 mg total) by mouth 2 (two) times daily. (Patient taking differently: Take 900 mg by mouth 2 (two) times daily. 1 tab in the am and 2 tabs in the pm) 60 tablet 11   No current facility-administered medications on file prior to visit.    ALLERGIES: No Known Allergies  FAMILY  HISTORY: Family History  Problem Relation Age of Onset  . Diabetes Father   . Hypertension Father   . Hypertension Mother     SOCIAL HISTORY: History   Social History  . Marital Status: Divorced    Spouse Name: N/A    Number of Children: N/A  . Years of Education: N/A   Occupational History  . Not on file.   Social History Main Topics  . Smoking status: Never Smoker   . Smokeless tobacco: Never Used  . Alcohol Use: No     Comment: none  . Drug Use: No  . Sexual Activity: No   Other Topics Concern  . Not on file   Social History Narrative   ** Merged History Encounter **       Work or School: on disability for the deafness, seizure and carpal  tunnel  Home Situation: lives with mother and father currently - since 06/2012  Spiritual Beliefs: Christian  Lifestyle: walks for 15-20 minutes a few times per week, well balanced d   iet       REVIEW OF SYSTEMS: Constitutional: No fevers, chills, or sweats, no generalized fatigue, change in appetite Eyes: No visual changes, double vision, eye pain Ear, nose and throat: No hearing loss, ear pain, nasal congestion, sore throat Cardiovascular: No chest pain, palpitations Respiratory:  No shortness of breath at rest or with exertion, wheezes GastrointestinaI: No nausea, vomiting, diarrhea, abdominal pain, fecal incontinence Genitourinary:  No dysuria, urinary retention or frequency Musculoskeletal:  No neck pain, back pain Integumentary: No rash, pruritus, skin lesions Neurological: as above Psychiatric: No depression, insomnia, anxiety Endocrine: No palpitations, fatigue, diaphoresis, mood swings, change in appetite, change in weight, increased thirst Hematologic/Lymphatic:  No anemia, purpura, petechiae. Allergic/Immunologic: no itchy/runny eyes, nasal congestion, recent allergic reactions, rashes  PHYSICAL EXAM: Filed Vitals:   06/09/14 1450  BP: 140/82  Pulse: 76  Temp: 97.5 F (36.4 C)  Resp: 16   General: No acute distress Head:  Normocephalic/atraumatic Eyes:  Fundoscopic exam unremarkable without vessel changes, exudates, hemorrhages or papilledema. Neck: supple, no paraspinal tenderness, full range of motion Heart:  Regular rate and rhythm Lungs:  Clear to auscultation bilaterally Back: No paraspinal tenderness Neurological Exam: alert and oriented to person, place, and time. Attention span and concentration intact, recent and remote memory intact, fund of knowledge intact.  Speech fluent and not dysarthric, language intact.  CN II-XII intact. Fundoscopic exam unremarkable without vessel changes, exudates, hemorrhages or papilledema.  Bulk and tone normal, muscle  strength 5/5 throughout.  Sensation to light touch, temperature and vibration intact.  Deep tendon reflexes 2+ throughout, toes downgoing.  Finger to nose and heel to shin testing intact.  Gait normal, Romberg negative.  IMPRESSION: Complex partial seizures and drop attacks.  I suspect that some of these spells may be behavioral.  She was well controlled up until this past year following the death of her father.  She also endorses depression. Dizziness  Depression  PLAN: 1.  To be sure we will recheck an EEG 2.  We will check another trough Trileptal and Lamotrigine level, as well as BMP to look for any cause of dizziness 3.  Follow up in 3 months  Metta Clines, DO  CC:  Colin Benton, DO

## 2014-06-10 ENCOUNTER — Other Ambulatory Visit: Payer: Self-pay | Admitting: Neurology

## 2014-06-10 DIAGNOSIS — F329 Major depressive disorder, single episode, unspecified: Secondary | ICD-10-CM | POA: Diagnosis not present

## 2014-06-10 DIAGNOSIS — R42 Dizziness and giddiness: Secondary | ICD-10-CM | POA: Diagnosis not present

## 2014-06-10 DIAGNOSIS — G40209 Localization-related (focal) (partial) symptomatic epilepsy and epileptic syndromes with complex partial seizures, not intractable, without status epilepticus: Secondary | ICD-10-CM | POA: Diagnosis not present

## 2014-06-10 DIAGNOSIS — G40409 Other generalized epilepsy and epileptic syndromes, not intractable, without status epilepticus: Secondary | ICD-10-CM | POA: Diagnosis not present

## 2014-06-10 LAB — BASIC METABOLIC PANEL
BUN: 11 mg/dL (ref 6–23)
CO2: 29 mEq/L (ref 19–32)
Calcium: 9.7 mg/dL (ref 8.4–10.5)
Chloride: 99 mEq/L (ref 96–112)
Creat: 0.88 mg/dL (ref 0.50–1.10)
Glucose, Bld: 83 mg/dL (ref 70–99)
POTASSIUM: 4.9 meq/L (ref 3.5–5.3)
Sodium: 135 mEq/L (ref 135–145)

## 2014-06-12 ENCOUNTER — Telehealth: Payer: Self-pay | Admitting: *Deleted

## 2014-06-12 LAB — LAMOTRIGINE LEVEL: Lamotrigine Lvl: 12.9 ug/mL (ref 4.0–18.0)

## 2014-06-12 LAB — 10-HYDROXYCARBAZEPINE: TRILIPTAL/MTB(OXCARBAZEPIN): 18.7 ug/mL (ref 8.0–35.0)

## 2014-06-12 NOTE — Telephone Encounter (Signed)
Patient is aware of normal levels and to monitor  dizzy spells for now

## 2014-06-12 NOTE — Telephone Encounter (Signed)
-----   Message from Dudley Major, DO sent at 06/12/2014 10:26 AM EST ----- All the blood work, including the medication levels, look good.  I would just monitor dizzy spells for now. ----- Message -----    From: [facility] in [facility] Interface    Sent: 06/12/2014  10:02 AM      To: Dudley Major, DO

## 2014-06-17 ENCOUNTER — Ambulatory Visit (INDEPENDENT_AMBULATORY_CARE_PROVIDER_SITE_OTHER): Payer: Medicare Other | Admitting: Neurology

## 2014-06-17 DIAGNOSIS — F329 Major depressive disorder, single episode, unspecified: Secondary | ICD-10-CM

## 2014-06-17 DIAGNOSIS — R42 Dizziness and giddiness: Secondary | ICD-10-CM

## 2014-06-17 DIAGNOSIS — F32A Depression, unspecified: Secondary | ICD-10-CM

## 2014-06-17 DIAGNOSIS — G40209 Localization-related (focal) (partial) symptomatic epilepsy and epileptic syndromes with complex partial seizures, not intractable, without status epilepticus: Secondary | ICD-10-CM | POA: Diagnosis not present

## 2014-06-18 ENCOUNTER — Telehealth: Payer: Self-pay | Admitting: *Deleted

## 2014-06-18 NOTE — Telephone Encounter (Signed)
-----   Message from Dudley Major, DO sent at 06/18/2014  6:30 AM EST ----- eeg was normal ----- Message -----    From: Dudley Major, DO    Sent: 06/18/2014   6:29 AM      To: Dudley Major, DO

## 2014-06-18 NOTE — Telephone Encounter (Signed)
Patient's mother is aware that EEG is normal ..

## 2014-06-18 NOTE — Procedures (Signed)
ELECTROENCEPHALOGRAM REPORT  Date of Study: 06/17/2014  Patient's Name: [name] [name] MRN: [id number] Date of Birth: 1956-02-11  Indication: 59 year old woman with history of temporal lobe epilepsy since childhood with increased frequency in spells  Medications: Lamotrigine Oxcarbazepine  Technical Summary: This is a multichannel digital EEG recording, using the international 10-20 placement system.  Spike detection software was employed.  Description: The EEG background is symmetric, with a well-developed posterior dominant rhythm of 8 Hz, which is reactive to eye opening and closing.  Diffuse beta activity is seen, with a bilateral frontal preponderance.  No focal or generalized abnormalities are seen.  No focal or generalized epileptiform discharges are seen.  Stage II sleep is not seen.  Hyperventilation and photic stimulation were performed, and produced no abnormalities.  ECG revealed normal cardiac rate and rhythm.  Impression: This is a normal routine EEG of the awake and drowsy states, with activating procedures.  A normal study does not rule out the possibility of a seizure disorder in this patient.  [name] R. [name], DO

## 2014-07-19 ENCOUNTER — Other Ambulatory Visit: Payer: Self-pay | Admitting: Neurology

## 2014-07-27 ENCOUNTER — Ambulatory Visit (INDEPENDENT_AMBULATORY_CARE_PROVIDER_SITE_OTHER): Payer: Medicare Other | Admitting: *Deleted

## 2014-07-27 ENCOUNTER — Telehealth: Payer: Self-pay | Admitting: Cardiology

## 2014-07-27 DIAGNOSIS — I495 Sick sinus syndrome: Secondary | ICD-10-CM | POA: Diagnosis not present

## 2014-07-27 NOTE — Progress Notes (Signed)
Remote pacemaker transmission.   

## 2014-07-27 NOTE — Telephone Encounter (Signed)
LMOVM reminding pt to send remote transmission.   

## 2014-07-28 LAB — MDC_IDC_ENUM_SESS_TYPE_REMOTE
Battery Impedance: 178 Ohm
Battery Remaining Longevity: 132 mo
Battery Voltage: 2.78 V
Brady Statistic AP VS Percent: 53 %
Brady Statistic AS VS Percent: 47 %
Date Time Interrogation Session: 20160328161147
Lead Channel Impedance Value: 554 Ohm
Lead Channel Impedance Value: 632 Ohm
Lead Channel Pacing Threshold Amplitude: 0.5 V
Lead Channel Pacing Threshold Amplitude: 0.75 V
Lead Channel Pacing Threshold Pulse Width: 0.4 ms
Lead Channel Pacing Threshold Pulse Width: 0.4 ms
Lead Channel Sensing Intrinsic Amplitude: 16 mV
Lead Channel Sensing Intrinsic Amplitude: 2.8 mV
Lead Channel Setting Pacing Amplitude: 2 V
Lead Channel Setting Pacing Pulse Width: 0.4 ms
MDC IDC SET LEADCHNL RV PACING AMPLITUDE: 2.5 V
MDC IDC SET LEADCHNL RV SENSING SENSITIVITY: 5.6 mV
MDC IDC STAT BRADY AP VP PERCENT: 0 %
MDC IDC STAT BRADY AS VP PERCENT: 0 %

## 2014-08-16 ENCOUNTER — Other Ambulatory Visit: Payer: Self-pay | Admitting: Neurology

## 2014-09-09 DIAGNOSIS — Z96643 Presence of artificial hip joint, bilateral: Secondary | ICD-10-CM | POA: Diagnosis not present

## 2014-09-14 ENCOUNTER — Ambulatory Visit (HOSPITAL_COMMUNITY)
Admission: RE | Admit: 2014-09-14 | Discharge: 2014-09-14 | Disposition: A | Discharge status: Home / Self Care | Payer: Medicare Other | Source: Ambulatory Visit | Attending: Neurology | Admitting: Neurology

## 2014-09-14 ENCOUNTER — Encounter: Payer: Self-pay | Admitting: Neurology

## 2014-09-14 ENCOUNTER — Ambulatory Visit (INDEPENDENT_AMBULATORY_CARE_PROVIDER_SITE_OTHER): Payer: Medicare Other | Admitting: Neurology

## 2014-09-14 VITALS — BP 118/70 | HR 78 | Resp 18 | Ht 63.0 in | Wt 185.9 lb

## 2014-09-14 DIAGNOSIS — G40009 Localization-related (focal) (partial) idiopathic epilepsy and epileptic syndromes with seizures of localized onset, not intractable, without status epilepticus: Secondary | ICD-10-CM | POA: Insufficient documentation

## 2014-09-14 DIAGNOSIS — R55 Syncope and collapse: Secondary | ICD-10-CM | POA: Diagnosis not present

## 2014-09-14 DIAGNOSIS — M79662 Pain in left lower leg: Secondary | ICD-10-CM | POA: Diagnosis not present

## 2014-09-14 DIAGNOSIS — M79609 Pain in unspecified limb: Secondary | ICD-10-CM | POA: Diagnosis not present

## 2014-09-14 NOTE — Patient Instructions (Addendum)
Continue oxcarbazepine 300mg  twice daily and lamotrigine ER 300mg  twice daily Check venous doppler of left leg for DVT [place]  4:30pm 09/14/14 Check BMP, Mg, CK Follow up in 3 months.

## 2014-09-14 NOTE — Progress Notes (Signed)
VASCULAR LAB PRELIMINARY  PRELIMINARY  PRELIMINARY  PRELIMINARY  Left lower extremity venous duplex completed.    Preliminary report:  Left:  No evidence of DVT or superficial thrombosis.  Ruptured  Baker's cyst noted in the left popliteal fossa and into the calf.Ralene Cork, RVT 09/14/2014, 6:12 PM

## 2014-09-14 NOTE — Progress Notes (Signed)
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  She is accompanied by her mother, who provides some history, and an interpretor .   EEG and labs reviewed.  UPDATE: Current medications:  Lamotrigine ER 300mg  twice daily.  Trileptal 300mg  twice daily.    Due to recurrence of drop attacks, she had another EEG performed on 06/17/14, which was normal.  Due to dizziness, trough levels of her antiepileptic medications were checked to look for toxicity.  Levels were normal.  Lamotrigine level was 12.9.  10-Hydroxycarbazepine level was 18.7.  BMP was normal.   She had two minor spells earlier this month, with lightheadedness and brief dimming of vision in both eyes.  She was able to sit down and it quickly resolved.  She has not had any drop attacks.  Since Tuesday, she has had pain in her left calf.  At one point, she noted numbness on the bottom of her left foot when she walked.  She has history of hip problems.  Her hip and knee were recently evaluated by her orthopedist and seem okay.  She denies back pain.    HISTORY:     Two types: 1) Staring spells, not fully respond, sometimes aware, tenses up but no convulsions, lasts seconds to a minute.  Last episode was in September 2014.  2) Drop attacks, loses conscious and falls.  Sometimes able to hold on to something.  She has had recurrence of drop attacks as well as dizzy spells for the past year.  It seems to have started following the death of her father last year.  The spells were well controlled up until that time.  She does endorse depression.  She has one spell a month, usually at the end of the month.  She had one at the  end of December, in which she fell and hit her head.  She had a dizzy spell at the end of January.  Prior medications:  She had side effects to Trileptal at 900mg .  Other medications tried, include Dilantin (ineffective), and immediate-release lamotrigine (ineffective).  Prior studies: EEG: focal slowing and frequent spike and wave activity in left anterior temporal lobe. MRI Brain (04/13/97): focal area of encephalomalacia in posterior left temporal lobe. MRI Brain (10/19/08): possible left mesial temporal sclerosis and lesion in left posterior temporal lobe.  She does not drive.  When she was [age] years old, she developed a virus following a case of the chicken pox. 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  PAST MEDICAL HISTORY: Past Medical History  Diagnosis Date  . Seizures     Petite Mal   . Pacemaker   . Chicken pox   . Conductive hearing loss, childhood onset   . Pacemaker     2012  . Seizure   . Deafness     from medication a a child for chicken pox  . Epilepsy, followed by Dr. Tomma Rakers in neurology 08/26/2012    MEDICATIONS: Current Outpatient Prescriptions on File Prior to Visit  Medication Sig Dispense Refill  . Ascorbic Acid (VITAMIN C PO) Take 1 tablet by mouth daily.     . Cholecalciferol (VITAMIN  D) 2000 UNITS CAPS Take by mouth 2 (two) times daily.    . fish oil-omega-3 fatty acids 1000 MG capsule Take 2 g by mouth 2 (two) times daily.    . LamoTRIgine 300 MG TB24 TAKE ONE TABLET BY MOUTH TWICE DAILY 60 tablet 0  . magnesium 30 MG tablet Take 30 mg by mouth daily.     . Multiple Vitamin (MULTIVITAMIN WITH MINERALS) TABS tablet Take 1 tablet by mouth daily.    . Oxcarbazepine (TRILEPTAL) 300 MG tablet Take 1 tablet (300 mg total) by mouth 2 (two) times daily. (Patient taking differently: Take 900 mg by mouth 2 (two) times daily. 1 tab in the am and 2 tabs in the pm) 60 tablet 11   No current facility-administered medications  on file prior to visit.    ALLERGIES: No Known Allergies  FAMILY HISTORY: Family History  Problem Relation Age of Onset  . Diabetes Father   . Hypertension Father   . Hypertension Mother     SOCIAL HISTORY: History   Social History  . Marital Status: Divorced    Spouse Name: N/A  . Number of Children: N/A  . Years of Education: N/A   Occupational History  . Not on file.   Social History Main Topics  . Smoking status: Never Smoker   . Smokeless tobacco: Never Used  . Alcohol Use: No     Comment: none  . Drug Use: No  . Sexual Activity: No   Other Topics Concern  . Not on file   Social History Narrative   ** Merged History Encounter **       Work or School: on disability for the deafness, seizure and carpal tunnel  Home Situation: lives with mother and father currently - since 06/2012  Spiritual Beliefs: Christian  Lifestyle: walks for 15-20 minutes a few times per week, well balanced d   iet       REVIEW OF SYSTEMS: Constitutional: No fevers, chills, or sweats, no generalized fatigue, change in appetite Eyes: No visual changes, double vision, eye pain Ear, nose and throat: No hearing loss, ear pain, nasal congestion, sore throat Cardiovascular: No chest pain, palpitations Respiratory:  No shortness of breath at rest or with exertion, wheezes GastrointestinaI: No nausea, vomiting, diarrhea, abdominal pain, fecal incontinence Genitourinary:  No dysuria, urinary retention or frequency Musculoskeletal:  No neck pain, back pain Integumentary: No rash, pruritus, skin lesions Neurological: as above Psychiatric: No depression, insomnia, anxiety Endocrine: No palpitations, fatigue, diaphoresis, mood swings, change in appetite, change in weight, increased thirst Hematologic/Lymphatic:  No anemia, purpura, petechiae. Allergic/Immunologic: no itchy/runny eyes, nasal congestion, recent allergic reactions, rashes  PHYSICAL EXAM: Filed Vitals:   09/14/14 1120    BP: 118/70  Pulse: 78  Resp: 18   General: No acute distress Head:  Normocephalic/atraumatic Eyes:  Fundoscopic exam unremarkable without vessel changes, exudates, hemorrhages or papilledema. Neck: supple, no paraspinal tenderness, full range of motion Heart:  Regular rate and rhythm Lungs:  Clear to auscultation bilaterally Back: No paraspinal tenderness Neurological Exam: alert and oriented to person, place, and time. Attention span and concentration intact, recent and remote memory intact, fund of knowledge intact.  Speech fluent and not dysarthric, language intact.  Deaf, otherwise, CN II-XII intact. Fundoscopic exam unremarkable without vessel changes, exudates, hemorrhages or papilledema.  Bulk and tone normal, muscle strength 5/5 throughout.  Tenderness to palpation of the left calf.  Positive Homan's sign.  Sensation to light touch, temperature and vibration intact.  Deep  tendon reflexes 2+ throughout, toes downgoing.  Finger to nose and heel to shin testing intact.  Gait normal, Romberg negative.  Temperature warm and pedal pulse present.  IMPRESSION: Localization related epilepsy with complex partial seizures and temporal lobe epilepsy Drop attacks Left calf pain.  She has a positive Homan's sign, but this may be false as her calf is tender to palpation anyway.  However, she spends extended periods of time sitting, so DVT remains on differential.  With numbness on sole of foot, S1 radiculopathy also possible.  PLAN: Continue oxcarbazepine 300mg  twice daily and lamotrigine ER 300mg  twice daily Check venous doppler of left leg for DVT [place]  4:30pm 09/14/14 Check BMP, Mg, CK Follow up in 3 months.  Metta Clines, DO  CC:  Colin Benton, DO

## 2014-09-15 ENCOUNTER — Telehealth: Payer: Self-pay | Admitting: Family Medicine

## 2014-09-15 ENCOUNTER — Other Ambulatory Visit: Payer: Self-pay | Admitting: Neurology

## 2014-09-15 LAB — BASIC METABOLIC PANEL
BUN: 8 mg/dL (ref 6–23)
CHLORIDE: 93 meq/L — AB (ref 96–112)
CO2: 26 meq/L (ref 19–32)
Calcium: 9.4 mg/dL (ref 8.4–10.5)
Creat: 0.78 mg/dL (ref 0.50–1.10)
Glucose, Bld: 78 mg/dL (ref 70–99)
Potassium: 4.3 mEq/L (ref 3.5–5.3)
SODIUM: 131 meq/L — AB (ref 135–145)

## 2014-09-15 LAB — MAGNESIUM: MAGNESIUM: 1.9 mg/dL (ref 1.5–2.5)

## 2014-09-15 LAB — CK: Total CK: 46 U/L (ref 7–177)

## 2014-09-15 NOTE — Telephone Encounter (Signed)
I have not seen this pt in some time. It appears this was ordered by her specialist and would advise they call office of ordering physician. Thanks. It appears she does need to schedule annual exam if still plans to see [place]. Thanks.

## 2014-09-15 NOTE — Telephone Encounter (Signed)
Mother wanted you to know that her preliminary LE doppler was available to view and they would a call to discuss her care. Mom has just been giving her Tylenol.

## 2014-09-16 ENCOUNTER — Telehealth: Payer: Self-pay | Admitting: *Deleted

## 2014-09-16 NOTE — Telephone Encounter (Signed)
Please see my forwarded message regarding the venous doppler. I said that the calf pain is due to a ruptured Baker's cyst which was found on the doppler.  She should continue using ibuprofen.  She may want to contact her PCP to find out if she recommends anything else.

## 2014-09-16 NOTE — Telephone Encounter (Signed)
I called the pts mother and informed her of the message below and she stated she will call back to schedule a physical for the pt.

## 2014-09-16 NOTE — Telephone Encounter (Signed)
I spoke with patient 's mother regarding         Please see my forwarded message regarding the venous doppler. I said that the calf pain is due to a ruptured Baker's cyst which was found on the doppler. She should continue using ibuprofen. She may want to contact her PCP to find out if she recommends anything else.

## 2014-09-16 NOTE — Telephone Encounter (Signed)
Please advise on below note

## 2014-09-16 NOTE — Telephone Encounter (Signed)
Patients mother [name] [name] calling to follow up on Mondays visit she would like to know what she needs to do from this point please advise  Call back number [phone])

## 2014-09-16 NOTE — Telephone Encounter (Signed)
Patient mother is aware of doppler results and was advised to contact PCP or orthopedic Dr

## 2014-09-16 NOTE — Telephone Encounter (Signed)
-----   Message from Lucretia Kern, DO sent at 09/16/2014 11:59 AM EDT ----- Quita Skye, Thank you for picking this up and for the recommendations. We have not seen Lattie Haw in [place]. I will have my assistant call her to help address this and to find out if she still plans to see [place] for primary care. Thank you.  Bishop Dublin,  Can you please call Jeannia to see if she still plans to see [place] for primary care. If so, please schedule annual exam. Also, please help set her up with an appt. with Dr. Tamala Julian in sports medicine regarding the ruptured baker's cyst. Thank you.   ----- Message -----    From: Pieter Partridge, DO    Sent: 09/16/2014  11:40 AM      To: Lucretia Kern, DO  I saw this patient for seizures and she was complaining of left calf pain.  Venous doppler was negative for DVT but showed a ruptured Baker's cyst in the popliteal fossa, which is the cause of her calf pain.  I advised her to take ibuprofen and to contact you or her orthopedist for further recommendations

## 2014-09-16 NOTE — Telephone Encounter (Signed)
-----   Message from Pieter Partridge, DO sent at 09/16/2014  6:51 AM EDT ----- Venous doppler shows no DVT but does show a ruptured baker's cyst behind the left knee, which is probably the cause of the calf pain.  I would address this with her PCP.  In the meantime, she should continue ibuprofen as needed and elevate the leg.  Labs look unremarkable.  Sodium is only mildly low (131).  She keep hydrated.

## 2014-09-16 NOTE — Telephone Encounter (Signed)
I spoke with the pts mother and informed her of the message below and she stated the pt already has an orthopedic doctor and she is waiting on Dr Erin Sons office to call her back with an appt.

## 2014-10-08 ENCOUNTER — Encounter: Payer: Self-pay | Admitting: Internal Medicine

## 2014-10-18 ENCOUNTER — Other Ambulatory Visit: Payer: Self-pay | Admitting: Neurology

## 2014-10-26 ENCOUNTER — Ambulatory Visit (INDEPENDENT_AMBULATORY_CARE_PROVIDER_SITE_OTHER): Payer: Medicare Other | Admitting: *Deleted

## 2014-10-26 ENCOUNTER — Telehealth: Payer: Self-pay | Admitting: Cardiology

## 2014-10-26 DIAGNOSIS — I495 Sick sinus syndrome: Secondary | ICD-10-CM

## 2014-10-26 NOTE — Progress Notes (Signed)
Remote pacemaker transmission.   

## 2014-10-26 NOTE — Telephone Encounter (Signed)
Spoke with pt and reminded pt of remote transmission that is due today. Pt verbalized understanding.   

## 2014-10-28 LAB — CUP PACEART REMOTE DEVICE CHECK
Battery Impedance: 178 Ohm
Battery Remaining Longevity: 132 mo
Brady Statistic AP VP Percent: 0 %
Brady Statistic AS VP Percent: 0 %
Date Time Interrogation Session: 20160627145737
Lead Channel Impedance Value: 658 Ohm
Lead Channel Pacing Threshold Amplitude: 0.75 V
Lead Channel Pacing Threshold Pulse Width: 0.4 ms
Lead Channel Sensing Intrinsic Amplitude: 16 mV
Lead Channel Sensing Intrinsic Amplitude: 2.8 mV
Lead Channel Setting Pacing Amplitude: 2.5 V
Lead Channel Setting Pacing Pulse Width: 0.4 ms
MDC IDC MSMT BATTERY VOLTAGE: 2.78 V
MDC IDC MSMT LEADCHNL RA IMPEDANCE VALUE: 562 Ohm
MDC IDC MSMT LEADCHNL RV PACING THRESHOLD AMPLITUDE: 0.5 V
MDC IDC MSMT LEADCHNL RV PACING THRESHOLD PULSEWIDTH: 0.4 ms
MDC IDC SET LEADCHNL RA PACING AMPLITUDE: 2 V
MDC IDC SET LEADCHNL RV SENSING SENSITIVITY: 5.6 mV
MDC IDC STAT BRADY AP VS PERCENT: 55 %
MDC IDC STAT BRADY AS VS PERCENT: 45 %

## 2014-11-15 ENCOUNTER — Other Ambulatory Visit: Payer: Self-pay | Admitting: Neurology

## 2014-12-04 ENCOUNTER — Encounter: Payer: Self-pay | Admitting: Cardiology

## 2014-12-11 ENCOUNTER — Encounter: Payer: Self-pay | Admitting: Internal Medicine

## 2014-12-13 ENCOUNTER — Other Ambulatory Visit: Payer: Self-pay | Admitting: Neurology

## 2014-12-22 ENCOUNTER — Encounter: Payer: Self-pay | Admitting: Cardiology

## 2015-01-01 ENCOUNTER — Ambulatory Visit (INDEPENDENT_AMBULATORY_CARE_PROVIDER_SITE_OTHER): Payer: Medicare Other | Admitting: Neurology

## 2015-01-01 ENCOUNTER — Encounter: Payer: Self-pay | Admitting: Neurology

## 2015-01-01 VITALS — BP 122/70 | HR 84 | Resp 20 | Ht 63.0 in | Wt 189.6 lb

## 2015-01-01 DIAGNOSIS — R55 Syncope and collapse: Secondary | ICD-10-CM

## 2015-01-01 DIAGNOSIS — R42 Dizziness and giddiness: Secondary | ICD-10-CM

## 2015-01-01 DIAGNOSIS — G40009 Localization-related (focal) (partial) idiopathic epilepsy and epileptic syndromes with seizures of localized onset, not intractable, without status epilepticus: Secondary | ICD-10-CM | POA: Insufficient documentation

## 2015-01-01 NOTE — Progress Notes (Signed)
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  She is accompanied by her mother, who provides some history, and an interpreter.     UPDATE: Current medications:  Lamotrigine ER 300mg  twice daily.  Trileptal 300mg  twice daily.    There has been no significant change.  She still feels dizzy every now and then.  She had brief spell of zoning out on 6/21.  She felt lightheaded prior to it.  On 6/26, she passed out.  Cardiac monitor was negative. Labs from 09/14/14:  Na 131, K 4.3, Cl 93, BUN 78, Cr 0.78  Last visit, she noted pain in the left calf.  Ultrasound revealed a ruptured Baker's cyst.  She was subsequently treated.   HISTORY:     Two types: 1) Staring spells, not fully respond, sometimes aware, tenses up but no convulsions, lasts seconds to a minute.  Last episode was in September 2014.  2) Drop attacks, loses conscious and falls.  Sometimes able to hold on to something.  She has had recurrence of drop attacks as well as dizzy spells for the past year.  It seems to have started following the death of her father last year.  The spells were well controlled up until that time.  She does endorse depression.  She has one spell a month, usually at the end of the month.  She had one at the end of December, in which she fell and hit her head.  She had a dizzy spell at the end of January.  Due to recurrence of drop attacks, she had another EEG performed on 06/17/14, which was normal.  Due to dizziness, trough levels of her antiepileptic medications were checked to look for toxicity, which were normal.  Levels were normal.    Prior medications:  She had side effects to  Trileptal at 900mg .  Other medications tried, include Dilantin (ineffective), and immediate-release lamotrigine (ineffective).  Prior studies: EEG: focal slowing and frequent spike and wave activity in left anterior temporal lobe. MRI Brain (04/13/97): focal area of encephalomalacia in posterior left temporal lobe. MRI Brain (10/19/08): possible left mesial temporal sclerosis and lesion in left posterior temporal lobe.  She does not drive.  When she was [age] years old, she developed a virus following a case of the chicken pox. 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  PAST MEDICAL HISTORY: Past Medical History  Diagnosis Date  . Seizures     Petite Mal   . Pacemaker   . Chicken pox   . Conductive hearing loss, childhood onset   . Pacemaker     2012  . Seizure   . Deafness     from medication a a child for chicken pox  . Epilepsy, followed by Dr. Tomma Rakers in neurology 08/26/2012    MEDICATIONS: Current Outpatient Prescriptions on File Prior to Visit  Medication Sig Dispense Refill  . Ascorbic Acid (VITAMIN C PO) Take 1 tablet by mouth daily.     . Cholecalciferol (VITAMIN D) 2000 UNITS CAPS Take by mouth 2 (two) times daily.    . fish oil-omega-3 fatty acids 1000 MG capsule Take 2 g by mouth  2 (two) times daily.    . LamoTRIgine 300 MG TB24 TAKE ONE TABLET BY MOUTH TWICE DAILY 60 tablet 0  . magnesium 30 MG tablet Take 30 mg by mouth daily.     . Multiple Vitamin (MULTIVITAMIN WITH MINERALS) TABS tablet Take 1 tablet by mouth daily.    . Oxcarbazepine (TRILEPTAL) 300 MG tablet Take 1 tablet (300 mg total) by mouth 2 (two) times daily. (Patient taking differently: Take 900 mg by mouth 2 (two) times daily. 1 tab in the am and 2 tabs in the pm) 60 tablet 11   No current facility-administered medications on file prior to visit.    ALLERGIES: No Known Allergies  FAMILY HISTORY: Family History  Problem Relation Age of Onset  . Diabetes  Father   . Hypertension Father   . Hypertension Mother     SOCIAL HISTORY: Social History   Social History  . Marital Status: Divorced    Spouse Name: N/A  . Number of Children: N/A  . Years of Education: N/A   Occupational History  . Not on file.   Social History Main Topics  . Smoking status: Never Smoker   . Smokeless tobacco: Never Used  . Alcohol Use: No     Comment: none  . Drug Use: No  . Sexual Activity: No   Other Topics Concern  . Not on file   Social History Narrative   ** Merged History Encounter **       Work or School: on disability for the deafness, seizure and carpal tunnel  Home Situation: lives with mother and father currently - since 06/2012  Spiritual Beliefs: Christian  Lifestyle: walks for 15-20 minutes a few times per week, well balanced d   iet       REVIEW OF SYSTEMS: Constitutional: No fevers, chills, or sweats, no generalized fatigue, change in appetite Eyes: No visual changes, double vision, eye pain Ear, nose and throat: No hearing loss, ear pain, nasal congestion, sore throat Cardiovascular: No chest pain, palpitations Respiratory:  No shortness of breath at rest or with exertion, wheezes GastrointestinaI: No nausea, vomiting, diarrhea, abdominal pain, fecal incontinence Genitourinary:  No dysuria, urinary retention or frequency Musculoskeletal:  No neck pain, back pain Integumentary: No rash, pruritus, skin lesions Neurological: as above Psychiatric: No depression, insomnia, anxiety Endocrine: No palpitations, fatigue, diaphoresis, mood swings, change in appetite, change in weight, increased thirst Hematologic/Lymphatic:  No anemia, purpura, petechiae. Allergic/Immunologic: no itchy/runny eyes, nasal congestion, recent allergic reactions, rashes  PHYSICAL EXAM: Filed Vitals:   01/01/15 1420  BP: 122/70  Pulse: 84  Resp: 20   General: No acute distress.  Patient appears well-groomed.   Head:   Normocephalic/atraumatic Eyes:  Fundoscopic exam unremarkable without vessel changes, exudates, hemorrhages or papilledema. Neck: supple, no paraspinal tenderness, full range of motion Heart:  Regular rate and rhythm Lungs:  Clear to auscultation bilaterally Back: No paraspinal tenderness Neurological Exam: alert and oriented to person, place, and time. Attention span and concentration intact, recent and remote memory intact, fund of knowledge intact.  Speech fluent and not dysarthric, language intact.  Deaf, otherwise CN II-XII intact. Fundoscopic exam unremarkable without vessel changes, exudates, hemorrhages or papilledema.  Bulk and tone normal, muscle strength 5/5 throughout.  Sensation to light touch intact.  Deep tendon reflexes 2+ throughout.  Finger to nose and heel to shin testing intact.  Gait normal.  IMPRESSION:   Localization-related epilepsy with complex partial seizures Drop attacks Dizziness   PLAN: Lamictal ER 300mg   twice daily and Trileptal 300mg  twice daily Follow up in 6 months  15 minutes spent face to face with patient, over 50% spent discussing management.  Metta Clines, DO  CC:  Colin Benton, DO

## 2015-01-01 NOTE — Patient Instructions (Addendum)
Continue lamotrigine ER 300mg  twice daily and oxcarbazepine 300mg  twice daily Follow up in 6 months

## 2015-01-13 ENCOUNTER — Other Ambulatory Visit: Payer: Self-pay | Admitting: Neurology

## 2015-01-13 DIAGNOSIS — M25562 Pain in left knee: Secondary | ICD-10-CM | POA: Diagnosis not present

## 2015-02-04 ENCOUNTER — Ambulatory Visit (INDEPENDENT_AMBULATORY_CARE_PROVIDER_SITE_OTHER): Payer: Medicare Other

## 2015-02-04 DIAGNOSIS — Z23 Encounter for immunization: Secondary | ICD-10-CM | POA: Diagnosis not present

## 2015-02-10 DIAGNOSIS — M25562 Pain in left knee: Secondary | ICD-10-CM | POA: Diagnosis not present

## 2015-02-14 ENCOUNTER — Other Ambulatory Visit: Payer: Self-pay | Admitting: Neurology

## 2015-02-15 NOTE — Telephone Encounter (Signed)
Lamotrigine refill requested. Per last office note- patient to remain on medication. Refill approved and sent to patient's pharmacy.

## 2015-03-16 ENCOUNTER — Other Ambulatory Visit: Payer: Self-pay | Admitting: Neurology

## 2015-03-17 NOTE — Telephone Encounter (Signed)
Last OV: 01/01/15 Next OV: 07/02/15

## 2015-03-19 ENCOUNTER — Encounter: Payer: Self-pay | Admitting: Internal Medicine

## 2015-03-19 ENCOUNTER — Ambulatory Visit (INDEPENDENT_AMBULATORY_CARE_PROVIDER_SITE_OTHER): Payer: Medicare Other | Admitting: Internal Medicine

## 2015-03-19 VITALS — BP 136/82 | HR 68 | Ht 67.0 in | Wt 189.0 lb

## 2015-03-19 DIAGNOSIS — I495 Sick sinus syndrome: Secondary | ICD-10-CM

## 2015-03-19 DIAGNOSIS — Z950 Presence of cardiac pacemaker: Secondary | ICD-10-CM

## 2015-03-19 LAB — CUP PACEART INCLINIC DEVICE CHECK
Battery Impedance: 201 Ohm
Battery Remaining Longevity: 127 mo
Battery Voltage: 2.78 V
Brady Statistic AP VP Percent: 0 %
Brady Statistic AS VS Percent: 46 %
Date Time Interrogation Session: 20161118155531
Implantable Lead Implant Date: 20120409
Implantable Lead Location: 753859
Implantable Lead Location: 753860
Implantable Lead Model: 4076
Lead Channel Impedance Value: 531 Ohm
Lead Channel Pacing Threshold Amplitude: 0.5 V
Lead Channel Pacing Threshold Amplitude: 1 V
Lead Channel Sensing Intrinsic Amplitude: 31 mV
Lead Channel Sensing Intrinsic Amplitude: 5.6 mV
Lead Channel Setting Pacing Amplitude: 2.5 V
Lead Channel Setting Pacing Pulse Width: 0.4 ms
Lead Channel Setting Sensing Sensitivity: 5.6 mV
MDC IDC LEAD IMPLANT DT: 20120409
MDC IDC MSMT LEADCHNL RA PACING THRESHOLD PULSEWIDTH: 0.4 ms
MDC IDC MSMT LEADCHNL RV IMPEDANCE VALUE: 630 Ohm
MDC IDC MSMT LEADCHNL RV PACING THRESHOLD PULSEWIDTH: 0.4 ms
MDC IDC SET LEADCHNL RA PACING AMPLITUDE: 2 V
MDC IDC STAT BRADY AP VS PERCENT: 54 %
MDC IDC STAT BRADY AS VP PERCENT: 0 %

## 2015-03-19 NOTE — Progress Notes (Signed)
Patient Care Team: Lucretia Kern, DO as PCP - General (Family Medicine)   HPI  [name] [name] is a 59 y.o. female Newly here to establish for pacemaker implanted in 2012 in [place]  She is deaf. Subsequent records suggest that was implanted for asystole during tilt table testing.  She has had recurrent syncope;  1 it occurred without warning. She has no recollection as to prodromal or recovery symptoms.  She has had spells her whole life long; medication was initiated by neurology with improvement   The patient denies chest pain, shortness of breath, nocturnal dyspnea, orthopnea or peripheral edema.  There have been no palpitations, lightheadedness or syncope.         Past Medical History  Diagnosis Date  . Seizures ([place])     Petite Mal   . Pacemaker   . Chicken pox   . Conductive hearing loss, childhood onset   . Pacemaker     2012  . Seizure ([place])   . Deafness     from medication a a child for chicken pox  . Epilepsy, followed by Dr. Tomma Rakers in neurology 08/26/2012    Past Surgical History  Procedure Laterality Date  . Hip arthroplasty    . Total hip arthroplasty  07/2011    right  . Total hip arthroplasty  03/2012    left  . Pacemaker insertion  07/2010    Current Outpatient Prescriptions  Medication Sig Dispense Refill  . Ascorbic Acid (VITAMIN C PO) Take 1 tablet by mouth daily.     . Cholecalciferol (VITAMIN D) 2000 UNITS CAPS Take by mouth 2 (two) times daily.    . fish oil-omega-3 fatty acids 1000 MG capsule Take 2 g by mouth 2 (two) times daily.    . LamoTRIgine 300 MG TB24 TAKE ONE TABLET BY MOUTH TWICE DAILY 60 tablet 5  . magnesium 30 MG tablet Take 30 mg by mouth daily.     . Multiple Vitamin (MULTIVITAMIN WITH MINERALS) TABS tablet Take 1 tablet by mouth daily.    . Oxcarbazepine (TRILEPTAL) 300 MG tablet TAKE ONE TABLET BY MOUTH IN THE MORNING AND  2  TABLETS  IN  THE  EVENING 90 tablet 3   No current facility-administered medications for this visit.     No Known Allergies  Review of Systems negative except from HPI and PMH  Physical Exam BP 136/82 mmHg  Pulse 68  Ht 5\' 7"  (1.702 m)  Wt 189 lb (85.73 kg)  BMI 29.59 kg/m2 Well developed and nourished in no acute distress HENT normal Neck supple with JVP-flat Clear Device pocket well healed; without hematoma or erythema.  There is no tethering  Regular rate and rhythm, no murmurs or gallops Abd-soft with active BS No Clubbing cyanosis edema There is tenderness at her left posterior shoulder Skin-warm and dry A & Oriented  Grossly normal sensory and motor function   ECG dated 20 to May 2014 demonstrates atrial pacing Intervals 26/08/39  Assessment and  Plan  Syncope  Sinus node dysfunction  Pacemaker-[employer]tronic  Seizures  Intercurrent syncope with hx that sounds like neurally mediated so have reprogrammed rate drip on in the hope it will help  We discussed extensively the issues of dysautonomia, the physiology of orthstasis and positional stress.  We discussed the role of salt and water repletion, the importance of exercise, often needing to be started in the recumbent position, and the awareness of triggers and the role of ambient heat and dehydration  25 We spent more than 50% of our >25 min visit in face to face counseling regarding the above

## 2015-03-19 NOTE — Patient Instructions (Signed)
Medication Instructions: - no changes  Labwork: - none  Procedures/Testing: - none  Follow-Up: - Remote monitoring is used to monitor your Pacemaker of ICD from home. This monitoring reduces the number of office visits required to check your device to one time per year. It allows [place] to keep an eye on the functioning of your device to ensure it is working properly. You are scheduled for a device check from home on 06/21/15. You may send your transmission at any time that day. If you have a wireless device, the transmission will be sent automatically. After your physician reviews your transmission, you will receive a postcard with your next transmission date.  - Your physician wants you to follow-up in: 1 year with Dr. [name]. You will receive a reminder letter in the mail two months in advance. If you don't receive a letter, please call our office to schedule the follow-up appointment.  Any Additional Special Instructions Will Be Listed Below (If Applicable).

## 2015-03-24 ENCOUNTER — Encounter: Payer: Self-pay | Admitting: Internal Medicine

## 2015-04-30 IMAGING — CT CT CERVICAL SPINE W/O CM
5 of 6 series · 14 of 33 positions shown, 16 images · non-contrast
Comparison: None.

CLINICAL DATA: History of epilepsy. PCP increased the dosage upper
medication. One week ago patient had syncopal episode and fell into
the [REDACTED] tree. Laceration of the occiput. Healing abrasion
closer to the vertex from a fall 1 month ago. No loss of
consciousness.

EXAM:
CT HEAD WITHOUT CONTRAST
CT CERVICAL SPINE WITHOUT CONTRAST
TECHNIQUE: Multidetector CT imaging of the head and cervical spine was
performed following the standard protocol without intravenous
contrast. Multiplanar CT image reconstructions of the cervical spine
were also generated.

[Series 4: head 2.0 h70h · axial · 0.43mm/px · z∈[+1666,+1716]mm · 2 of 76 slices shown]
[im 26/76  bone]
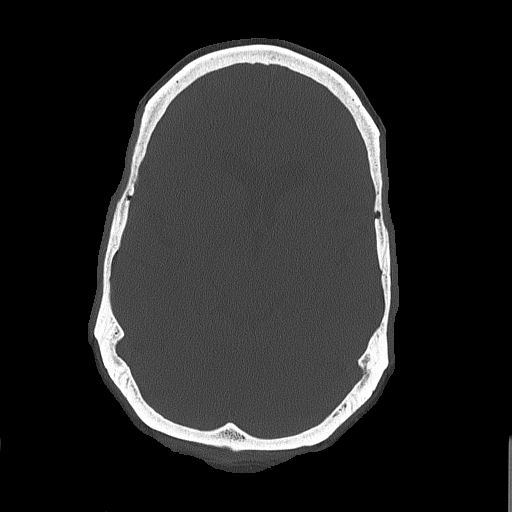
[im 51/76  bone]
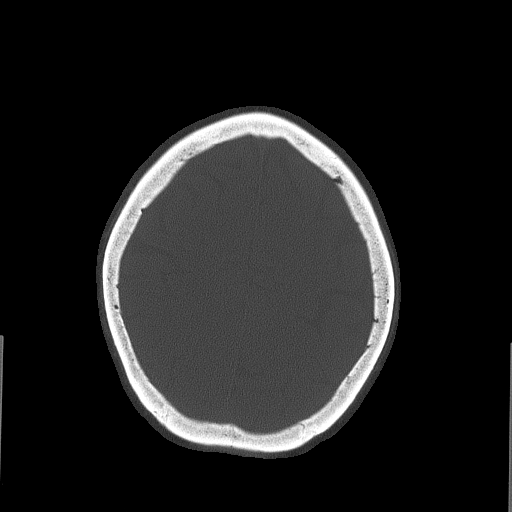

[Series 6: c_spine 2.0 i40s 3 · axial · 0.26mm/px · z∈[+1504,+1562]mm · 2 of 88 slices shown, 3 images]
[im 30/88  soft-tissue]
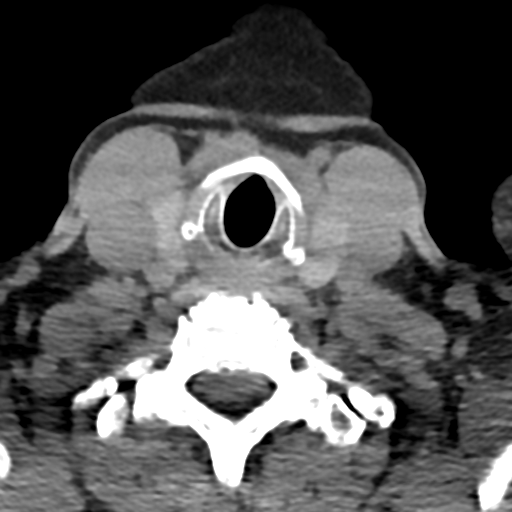
[im 30/88  bone]
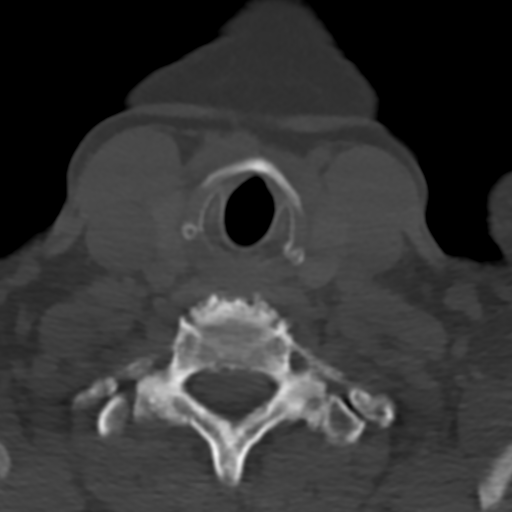
[im 59/88  bone]
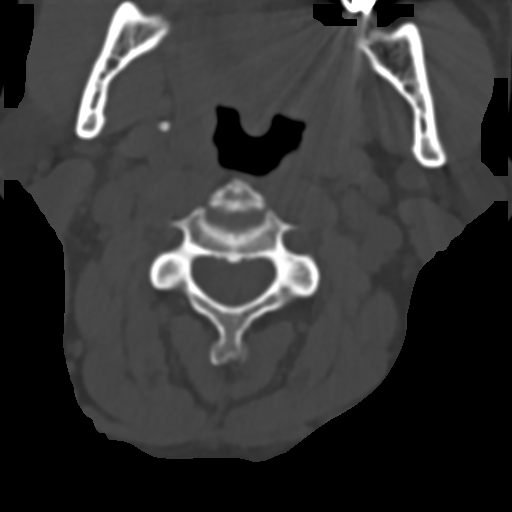

[Series 8: coronals · coronal · 0.30mm/px · 3 of 83 slices shown]
[im 17/83  bone]
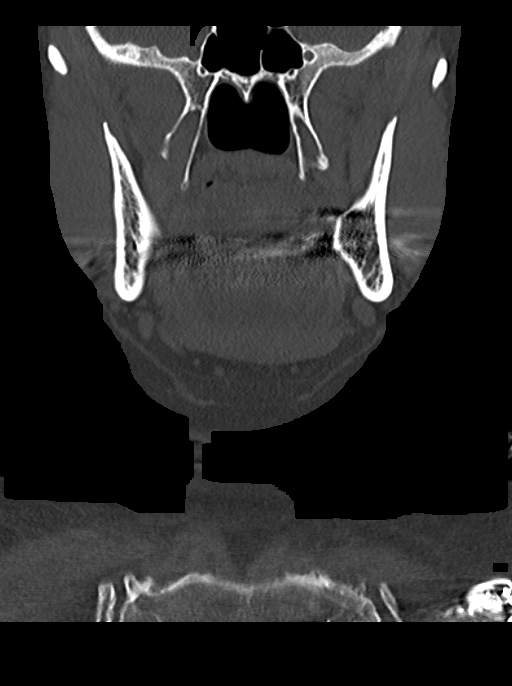
[im 33/83  bone]
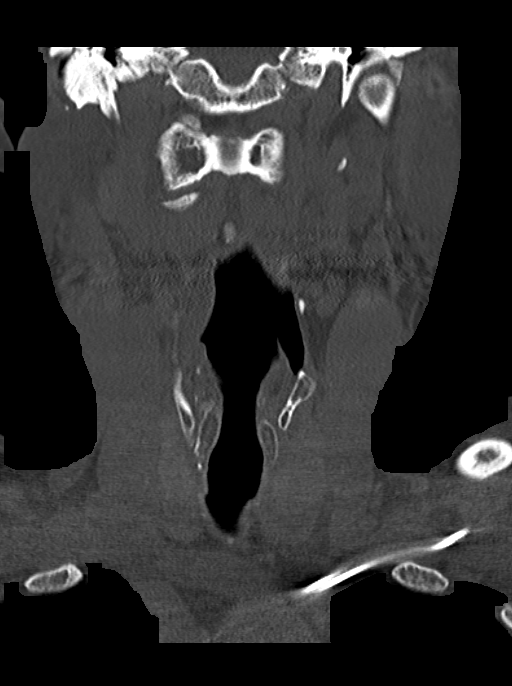
[im 50/83  bone]
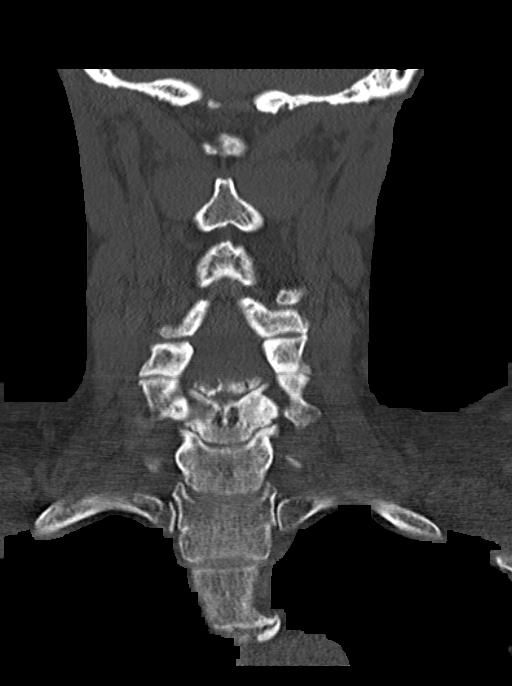

[Series 9: sagittals · sagittal · 0.28mm/px · 5 of 83 slices shown, 6 images]
[im 28/83  bone]
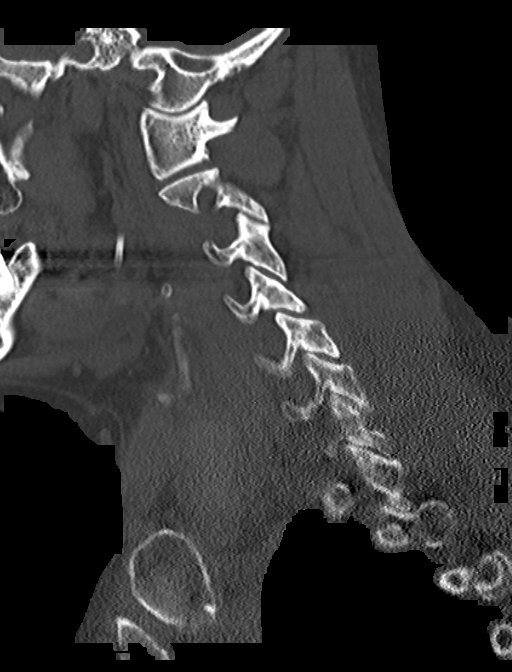
[im 35/83  bone]
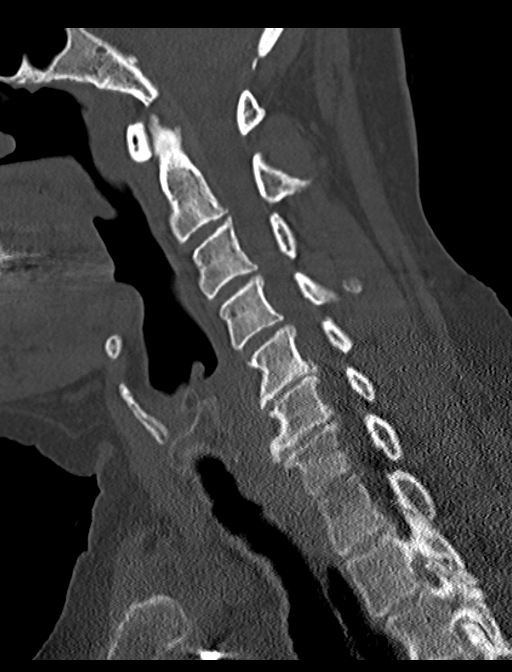
[im 42/83  soft-tissue]
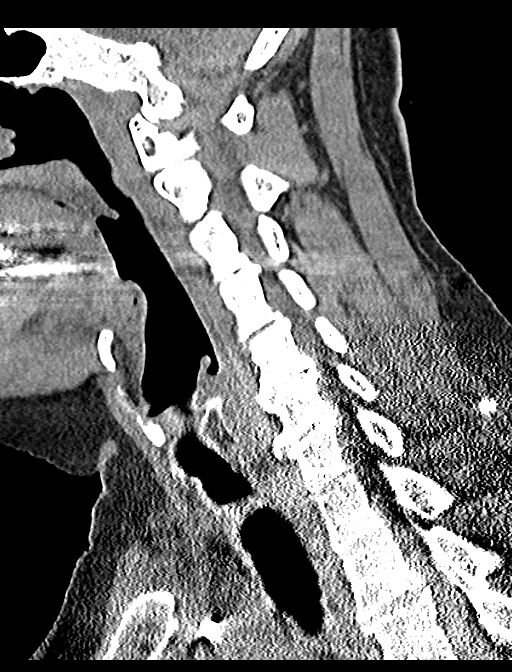
[im 42/83  bone]
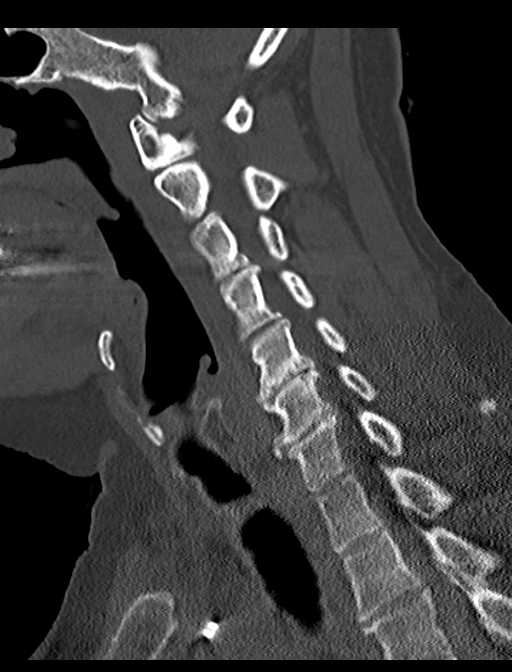
[im 48/83  bone]
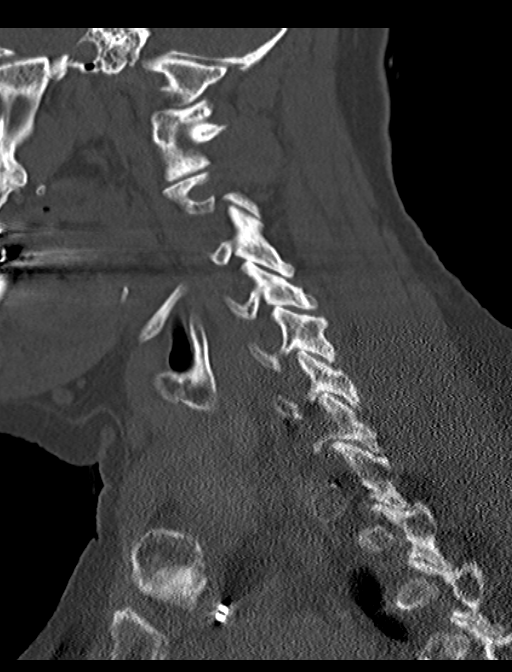
[im 55/83  bone]
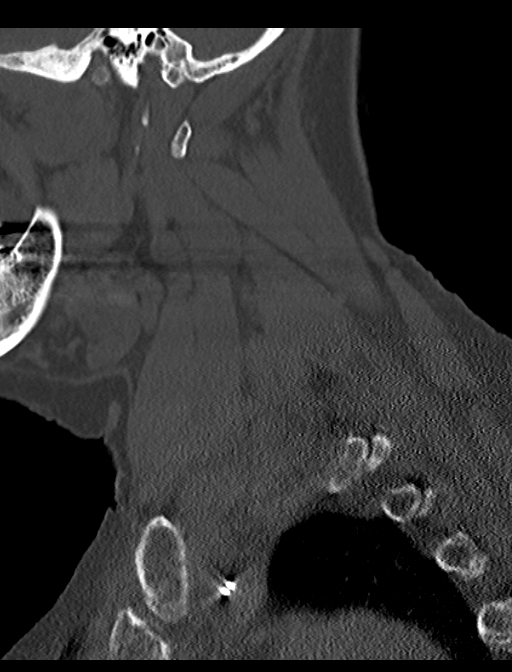

[Series 10: orthogonals · axial · 0.32mm/px · z∈[+1483,+1538]mm · 2 of 87 slices shown]
[im 29/87  bone]
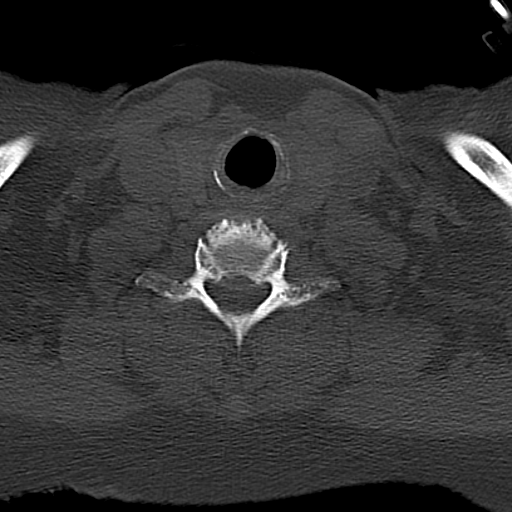
[im 58/87  bone]
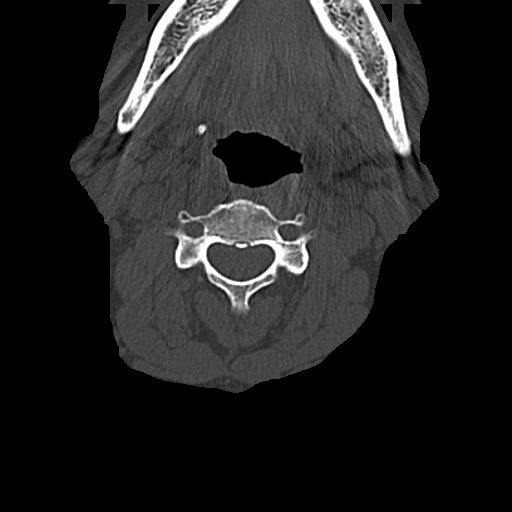

[14 of 33 positions shown; findings below may reference images not displayed]

FINDINGS: CT HEAD FINDINGS

There is no intra or extra-axial fluid collection or mass lesion.
The basilar cisterns and ventricles have a normal appearance. There
is no CT evidence for acute infarction or hemorrhage. Bone windows
show a right occipital scalp edema/ hematoma without underlying
calvarial fracture. Visualized paranasal sinuses are normally
aerated. Note is made of cerumen within the right external auditory
canal.

CT CERVICAL SPINE FINDINGS

There is normal alignment of the cervical spine. There is no
evidence for acute fracture or dislocation. Prevertebral soft
tissues have a normal appearance. Lung apices have a normal
appearance. Moderate degenerative changes are identified at C5-6 and
C6-7. There is loss of lordosis at these levels likely related to
degenerative change.
IMPRESSION: 1.  No evidence for acute intracranial abnormality.
2. Right occipital scalp edema/hematoma.
3. No calvarial fracture.
4. Moderate mid cervical degenerative change.
5.  No evidence for acute cervical spine abnormality.

## 2015-06-21 ENCOUNTER — Encounter: Payer: Medicare Other | Admitting: *Deleted

## 2015-06-21 ENCOUNTER — Telehealth: Payer: Self-pay | Admitting: Cardiology

## 2015-06-21 NOTE — Telephone Encounter (Signed)
Confirmed remote transmission w/ pt mom.  

## 2015-06-23 ENCOUNTER — Encounter: Payer: Self-pay | Admitting: Cardiology

## 2015-06-30 ENCOUNTER — Ambulatory Visit (INDEPENDENT_AMBULATORY_CARE_PROVIDER_SITE_OTHER): Payer: Medicare Other | Admitting: *Deleted

## 2015-06-30 DIAGNOSIS — I495 Sick sinus syndrome: Secondary | ICD-10-CM | POA: Diagnosis not present

## 2015-06-30 DIAGNOSIS — Z950 Presence of cardiac pacemaker: Secondary | ICD-10-CM

## 2015-07-01 NOTE — Progress Notes (Signed)
Remote pacemaker transmission.   

## 2015-07-02 ENCOUNTER — Ambulatory Visit (INDEPENDENT_AMBULATORY_CARE_PROVIDER_SITE_OTHER): Payer: Medicare Other | Admitting: Neurology

## 2015-07-02 ENCOUNTER — Other Ambulatory Visit: Payer: Medicare Other

## 2015-07-02 ENCOUNTER — Other Ambulatory Visit: Payer: Self-pay | Admitting: Neurology

## 2015-07-02 ENCOUNTER — Encounter: Payer: Self-pay | Admitting: Neurology

## 2015-07-02 VITALS — BP 120/88 | HR 70 | Ht 63.0 in | Wt 190.6 lb

## 2015-07-02 DIAGNOSIS — R42 Dizziness and giddiness: Secondary | ICD-10-CM

## 2015-07-02 DIAGNOSIS — R55 Syncope and collapse: Secondary | ICD-10-CM

## 2015-07-02 DIAGNOSIS — G40009 Localization-related (focal) (partial) idiopathic epilepsy and epileptic syndromes with seizures of localized onset, not intractable, without status epilepticus: Secondary | ICD-10-CM | POA: Diagnosis not present

## 2015-07-02 LAB — BASIC METABOLIC PANEL
BUN: 9 mg/dL (ref 7–25)
CO2: 29 mmol/L (ref 20–31)
Calcium: 9.7 mg/dL (ref 8.6–10.4)
Chloride: 94 mmol/L — ABNORMAL LOW (ref 98–110)
Creat: 0.76 mg/dL (ref 0.50–1.05)
Glucose, Bld: 85 mg/dL (ref 65–99)
POTASSIUM: 4.9 mmol/L (ref 3.5–5.3)
Sodium: 130 mmol/L — ABNORMAL LOW (ref 135–146)

## 2015-07-02 NOTE — Addendum Note (Signed)
Addended by: Chester Holstein on: 07/02/2015 03:49 PM   Modules accepted: Orders

## 2015-07-02 NOTE — Patient Instructions (Addendum)
1.  Continue lamotrigine 300mg  TB24, 1 tablet in morning and 1 tablet at night. 2.  Continue oxcarbazepine 300mg  tablets.  Take 1 tablet in morning and 2 tablets at night 3.  Check BMP 4.  Follow up in 6 months.  Repeat BMP prior to follow up.

## 2015-07-02 NOTE — Progress Notes (Signed)
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  She is accompanied by an interpreter.     UPDATE: Current medications:  Lamotrigine ER 300mg  twice daily.  Trileptal 300mg  in AM and 600mg  in PM.    She has not had recurrent staring spells She continues to have recurrent episodes of dizziness, namely lightheadedness.  There was no prodrome or postictal state.  Her cardiologist believed it to be neurally-mediated so the rate drip on her pacemaker was reprogrammed.    HISTORY:     Two types: 1) Staring spells, not fully respond, sometimes aware, tenses up but no convulsions, lasts seconds to a minute.  Last episode was in September 2014.  2) Drop attacks, loses conscious and falls.  Sometimes able to hold on to something.  She has had recurrence of drop attacks as well as dizzy spells for the past year.  It seems to have started following the death of her father last year.  The spells were well controlled up until that time.  She does endorse depression.  She has one spell a month, usually at the end of the month.  She had one at the end of December, in which she fell and hit her head.  She had a dizzy spell at the end of January.  Due to recurrence of drop attacks, she had another EEG performed on 06/17/14, which was normal.  Due to dizziness, trough levels of her antiepileptic medications were checked to look for toxicity, which were normal.  Levels were normal.    Prior medications:  She had side effects to Trileptal at 900mg .  Other medications tried, include Dilantin (ineffective), and immediate-release lamotrigine (ineffective).  Prior studies: EEG: focal slowing and  frequent spike and wave activity in left anterior temporal lobe. MRI Brain (04/13/97): focal area of encephalomalacia in posterior left temporal lobe. MRI Brain (10/19/08): possible left mesial temporal sclerosis and lesion in left posterior temporal lobe.  She does not drive.  When she was [age] years old, she developed a virus following a case of the chicken pox. 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  PAST MEDICAL HISTORY: Past Medical History  Diagnosis Date  . Seizures ([place])     Petite Mal   . Pacemaker   . Chicken pox   . Conductive hearing loss, childhood onset   . Pacemaker     2012  . Seizure ([place])   . Deafness     from medication a a child for chicken pox  . Epilepsy, followed by Dr. Tomma Rakers in neurology 08/26/2012    MEDICATIONS: Current Outpatient Prescriptions on File Prior to Visit  Medication Sig Dispense Refill  . Ascorbic Acid (VITAMIN C PO) Take 1 tablet by mouth daily.     . Cholecalciferol (VITAMIN D) 2000 UNITS CAPS Take by mouth 2 (two) times daily.    . fish oil-omega-3 fatty acids 1000 MG capsule Take 2 g by mouth 2 (two) times daily.    . LamoTRIgine 300 MG TB24 TAKE ONE TABLET BY MOUTH TWICE DAILY 60 tablet 5  . magnesium 30 MG tablet Take 30 mg by mouth daily.     Marland Kitchen  Multiple Vitamin (MULTIVITAMIN WITH MINERALS) TABS tablet Take 1 tablet by mouth daily.    . Oxcarbazepine (TRILEPTAL) 300 MG tablet TAKE ONE TABLET BY MOUTH IN THE MORNING AND  2  TABLETS  IN  THE  EVENING 90 tablet 3   No current facility-administered medications on file prior to visit.    ALLERGIES: No Known Allergies  FAMILY HISTORY: Family History  Problem Relation Age of Onset  . Diabetes Father   . Hypertension Father   . Hypertension Mother     SOCIAL HISTORY: Social History   Social History  . Marital Status: Divorced    Spouse Name: N/A  . Number of Children: N/A  . Years of Education: N/A   Occupational History  . Not on  file.   Social History Main Topics  . Smoking status: Never Smoker   . Smokeless tobacco: Never Used  . Alcohol Use: No     Comment: none  . Drug Use: No  . Sexual Activity: No   Other Topics Concern  . Not on file   Social History Narrative   ** Merged History Encounter **       Work or School: on disability for the deafness, seizure and carpal tunnel  Home Situation: lives with mother and father currently - since 06/2012  Spiritual Beliefs: Christian  Lifestyle: walks for 15-20 minutes a few times per week, well balanced d   iet       REVIEW OF SYSTEMS: Constitutional: No fevers, chills, or sweats, no generalized fatigue, change in appetite Eyes: No visual changes, double vision, eye pain Ear, nose and throat: No hearing loss, ear pain, nasal congestion, sore throat Cardiovascular: No chest pain, palpitations Respiratory:  No shortness of breath at rest or with exertion, wheezes GastrointestinaI: No nausea, vomiting, diarrhea, abdominal pain, fecal incontinence Genitourinary:  No dysuria, urinary retention or frequency Musculoskeletal:  No neck pain, back pain Integumentary: No rash, pruritus, skin lesions Neurological: as above Psychiatric: No depression, insomnia, anxiety Endocrine: No palpitations, fatigue, diaphoresis, mood swings, change in appetite, change in weight, increased thirst Hematologic/Lymphatic:  No anemia, purpura, petechiae. Allergic/Immunologic: no itchy/runny eyes, nasal congestion, recent allergic reactions, rashes  PHYSICAL EXAM: Filed Vitals:   07/02/15 1518  BP: 120/88  Pulse: 70   General: No acute distress.  Patient appears well-groomed.   Head:  Normocephalic/atraumatic Eyes:  Fundoscopic exam unremarkable without vessel changes, exudates, hemorrhages or papilledema. Neck: supple, no paraspinal tenderness, full range of motion Heart:  Regular rate and rhythm Lungs:  Clear to auscultation bilaterally Back: No paraspinal  tenderness Neurological Exam: alert and oriented to person, place, and time. Attention span and concentration intact, recent and remote memory intact, fund of knowledge intact.  Speech fluent and not dysarthric, language intact.  Deaf, otherwise CN II-XII intact. Fundoscopic exam unremarkable without vessel changes, exudates, hemorrhages or papilledema.  Bulk and tone normal, muscle strength 5/5 throughout.  Sensation to light touch intact.  Deep tendon reflexes 2+ throughout.  Finger to nose and heel to shin testing intact.  Gait normal. Romberg negative.  IMPRESSION: Localization-related epilepsy with complex partial seizures presenting as staring spells, stable Drop attacks.  Possibly neurally-mediated rather than epileptic. Dizziness  PLAN: Continue lamotrigine ER 300mg  twice daily and oxcarbazepine 300mg  in AM and 600mg  in PM Check BMP today and in 6 months prior to follow up F/u 6 months.  15 minutes spent face to face with patient, over 50% spent discussing management.  Metta Clines, DO  CC:  Jarrett Soho  Maudie Mercury, DO

## 2015-07-13 ENCOUNTER — Other Ambulatory Visit: Payer: Self-pay | Admitting: Neurology

## 2015-07-13 NOTE — Telephone Encounter (Signed)
Last OV: 07/02/15 Next OV: 01/04/16   oxcarbazepine 300mg  in AM and 600mg  in PM

## 2015-07-30 ENCOUNTER — Ambulatory Visit (INDEPENDENT_AMBULATORY_CARE_PROVIDER_SITE_OTHER): Payer: Medicare Other | Admitting: Family Medicine

## 2015-07-30 ENCOUNTER — Encounter: Payer: Self-pay | Admitting: Family Medicine

## 2015-07-30 VITALS — BP 120/88 | HR 70 | Temp 97.8°F | Ht 62.75 in | Wt 185.4 lb

## 2015-07-30 DIAGNOSIS — G40009 Localization-related (focal) (partial) idiopathic epilepsy and epileptic syndromes with seizures of localized onset, not intractable, without status epilepticus: Secondary | ICD-10-CM

## 2015-07-30 DIAGNOSIS — E785 Hyperlipidemia, unspecified: Secondary | ICD-10-CM

## 2015-07-30 DIAGNOSIS — Z0000 Encounter for general adult medical examination without abnormal findings: Secondary | ICD-10-CM

## 2015-07-30 DIAGNOSIS — G40209 Localization-related (focal) (partial) symptomatic epilepsy and epileptic syndromes with complex partial seizures, not intractable, without status epilepticus: Secondary | ICD-10-CM

## 2015-07-30 LAB — LIPID PANEL
CHOL/HDL RATIO: 3
CHOLESTEROL: 217 mg/dL — AB (ref 0–200)
HDL: 76.7 mg/dL (ref >39.00)
LDL CALC: 130 mg/dL — AB (ref 0–99)
NonHDL: 140.28
Triglycerides: 49 mg/dL (ref 0.0–149.0)
VLDL: 9.8 mg/dL (ref 0.0–40.0)

## 2015-07-30 NOTE — Progress Notes (Signed)
Medicare Annual Preventive Care Visit  (initial annual wellness or annual wellness exam)  Concerns and/or follow up today:  [name] [name] is a pleasant 60 yo with a PMH significant for deafness, abnormal pap smear, syncope, pacemaker and seizures here for an annual exam. She has not been here since 2014.  -Concerns and/or follow up today:  Hx recurrent syncope/hx pacemaker due to asystole on tilt table test: -sees Dr. 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 [name], neurology -meds: lamotrigine ER 300 bid, Trileptal 300mg  am, 600mg  pm  Hx Bakers cyst rupture and wrist pain: -sees ortho, Dr. Latanya Maudlin  History of abnormal Pap smear: Follow Dr. Ulanda Edison in the past Reports is scheduling an appointment with a different gynecologist for exam this year.  ROS: negative for report of fevers, unintentional weight loss, vision changes, vision loss, hearing loss or change, chest pain, sob, hemoptysis, melena, hematochezia, hematuria, genital discharge or lesions, falls, bleeding or bruising, loc, thoughts of suicide or self harm, memory loss  1.) Patient-completed health risk assessment  - completed and reviewed, see scanned documentation  2.) Review of Medical History: -PMH, PSH, Family History and current specialty and care providers reviewed and updated and listed below  - see scanned in document in chart and below  Past Medical History  Diagnosis Date  . 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 Pacemaker   . Chicken pox   . Conductive hearing loss, childhood onset   . Pacemaker     2012  . Seizure ([place])   . Deafness     from medication a a child for chicken pox  . Epilepsy, followed by Dr. Tomma Rakers in neurology 08/26/2012    Past Surgical History  Procedure Laterality Date  . Hip arthroplasty    . Total hip arthroplasty  07/2011    right  . Total hip arthroplasty  03/2012    left  . Pacemaker insertion  07/2010    Social History   Social  History  . Marital Status: Divorced    Spouse Name: N/A  . Number of Children: N/A  . Years of Education: N/A   Occupational History  . Not on file.   Social History Main Topics  . Smoking status: Never Smoker   . Smokeless tobacco: Never Used  . Alcohol Use: No     Comment: none  . Drug Use: No  . Sexual Activity: No   Other Topics Concern  . Not on file   Social History Narrative   ** Merged History Encounter **       Work or School: on disability for the deafness, seizure and carpal tunnel  Home Situation: lives with mother and father currently - since 06/2012  Spiritual Beliefs: Christian  Lifestyle: walks for 15-20 minutes a few times per week, well balanced d   iet       Family History  Problem Relation Age of Onset  . Diabetes Father   . Hypertension Father   . Hypertension Mother     Current Outpatient Prescriptions on File Prior to Visit  Medication Sig Dispense Refill  . Ascorbic Acid (VITAMIN C PO) Take 1 tablet by mouth daily.     . Cholecalciferol (VITAMIN D) 2000 UNITS CAPS Take by mouth 2 (two) times daily.    . fish oil-omega-3 fatty acids 1000 MG capsule Take 2 g by mouth 2 (two) times daily.    . LamoTRIgine 300 MG TB24 TAKE  ONE TABLET BY MOUTH TWICE DAILY 60 tablet 5  . magnesium 30 MG tablet Take 30 mg by mouth daily.     . Multiple Vitamin (MULTIVITAMIN WITH MINERALS) TABS tablet Take 1 tablet by mouth daily.    . Oxcarbazepine (TRILEPTAL) 300 MG tablet TAKE ONE TABLET BY MOUTH IN THE MORNING AND TWO IN THE EVENING 90 tablet 5   No current facility-administered medications on file prior to visit.     3.) Review of functional ability and level of safety:  Any difficulty hearing? Deaf, translator present  History of falling?  NO  Any trouble with IADLs - using a phone, using transportation, grocery shopping, preparing meals, doing housework, doing laundry, taking medications and managing money? NO  Advance Directives? NO  See  summary of recommendations in Patient Instructions below.  4.) Physical Exam Filed Vitals:   07/30/15 1134  BP: 120/88  Pulse: 70  Temp: 97.8 F (36.6 C)   Estimated body mass index is 33.1 kg/(m^2) as calculated from the following:   Height as of this encounter: 5' 2.75" (1.594 m).   Weight as of this encounter: 185 lb 6.4 oz (84.097 kg).  EKG (optional): deferred  General: alert, appear well hydrated and in no acute distress  HEENT: visual acuity grossly intact  CV: HRRR  ABD: soft, NTTP  GU/Breast: declined, sees gyn  Lungs: CTA bilaterally  Psych: pleasant and cooperative, no obvious depression or anxiety  Cognitive function grossly intact  See patient instructions for recommendations.  Education and counseling regarding the above review of health provided with a plan for the following: -see scanned patient completed form for further details -fall prevention strategies discussed  -healthy lifestyle discussed -importance and resources for completing advanced directives discussed -see patient instructions below for any other recommendations provided  4)The following written screening schedule of preventive measures were reviewed with assessment and plan made per below, orders and patient instructions:      AAA screening done if applicable     Alcohol screening done     Obesity Screening and counseling done     STI screening (Hep C if born 35-65) offered, declined     Tobacco Screening done        Pneumococcal (PPSV23 -one dose after 64, one before if risk factors), influenza yearly and hepatitis B vaccines (if high risk - end stage renal disease, IV drugs, homosexual men, live in home for mentally retarded, hemophilia receiving factors) ASSESSMENT/PLAN: done if applicable      Screening mammograph (yearly if >40) ASSESSMENT/PLAN: advised, offered to order, they agree to schedule      Screening Pap smear/pelvic exam (q2 years) ASSESSMENT/PLAN: declined,  agrees to schedule a gynecology      Colorectal cancer screening (FOBT yearly or flex sig q4y or colonoscopy q10y or barium enema q4y) ASSESSMENT/PLAN: discussed benefit screening and advised screening, discussed potential screening options, she refused all options      Diabetes outpatient self-management training services ASSESSMENT/PLAN:n/a      Bone mass measurements(covered q2y if indicated - estrogen def, osteoporosis, hyperparathyroid, vertebral abnormalities, osteoporosis or steroids) ASSESSMENT/PLAN: declined, seeing gyn      Screening for glaucoma(q1y if high risk - diabetes, FH, AA and > 50 or hispanic and > 65) ASSESSMENT/PLAN: utd per her report      Medical nutritional therapy for individuals with diabetes or renal disease ASSESSMENT/PLAN: see orders      Cardiovascular screening blood tests (lipids q5y) ASSESSMENT/PLAN: see orders and labs  Diabetes screening tests ASSESSMENT/PLAN: see orders and labs   7.) Summary: -risk factors and conditions per above assessment were discussed and treatment, recommendations and referrals were offered per documentation above and orders and patient instructions.  Medicare annual wellness visit, initial  Localization-related idiopathic epilepsy and epileptic syndromes with seizures of localized onset, not intractable, without status epilepticus ([place])  Partial epilepsy with impairment of consciousness ([place])  Hyperlipemia - Plan: Lipid panel  Patient Instructions   Before you leave: -labs -schedule medicare annual wellness visit in 1 year  Call today to schedule your mammogram.  Call today to schedule your gynecology exam.  Please see a lawyer and/or go to this website to help you with advanced directives and designating a health care power of attorney so that your wishes will be followed should you become too ill to make your own medical decisions.  [email or web address]  -We have ordered labs or  studies at this visit. It can take up to 1-2 weeks for results and processing. We will contact you with instructions IF your results are abnormal. Normal results will be released to your [facility]. If you have not heard from [place] or can not find your results in [facility] in 2 weeks please contact our office.  We recommend the following healthy lifestyle measures: - eat a healthy whole foods diet consisting of regular small meals composed of vegetables, fruits, beans, nuts, seeds, healthy meats such as white chicken and fish and whole grains.  - avoid sweets, white starchy foods, fried foods, fast food, processed foods, sodas, red meet and other fattening foods.  - get a least 150-300 minutes of aerobic exercise per week.

## 2015-07-30 NOTE — Progress Notes (Signed)
Pre visit review using our clinic review tool, if applicable. No additional management support is needed unless otherwise documented below in the visit note. 

## 2015-07-30 NOTE — Patient Instructions (Signed)
Before you leave: -labs -schedule medicare annual wellness visit in 1 year  Call today to schedule your mammogram.  Call today to schedule your gynecology exam.  Please see a lawyer and/or go to this website to help you with advanced directives and designating a health care power of attorney so that your wishes will be followed should you become too ill to make your own medical decisions.  [email or web address]  -We have ordered labs or studies at this visit. It can take up to 1-2 weeks for results and processing. We will contact you with instructions IF your results are abnormal. Normal results will be released to your [facility]. If you have not heard from [place] or can not find your results in [facility] in 2 weeks please contact our office.  We recommend the following healthy lifestyle measures: - eat a healthy whole foods diet consisting of regular small meals composed of vegetables, fruits, beans, nuts, seeds, healthy meats such as white chicken and fish and whole grains.  - avoid sweets, white starchy foods, fried foods, fast food, processed foods, sodas, red meet and other fattening foods.  - get a least 150-300 minutes of aerobic exercise per week.

## 2015-08-04 LAB — CUP PACEART REMOTE DEVICE CHECK
Battery Voltage: 2.78 V
Brady Statistic AP VS Percent: 0 %
Brady Statistic AS VP Percent: 0 %
Implantable Lead Implant Date: 20120409
Implantable Lead Implant Date: 20120409
Implantable Lead Location: 753860
Implantable Lead Model: 4076
Lead Channel Impedance Value: 553 Ohm
Lead Channel Impedance Value: 654 Ohm
Lead Channel Pacing Threshold Amplitude: 0.5 V
Lead Channel Pacing Threshold Pulse Width: 0.4 ms
Lead Channel Pacing Threshold Pulse Width: 0.4 ms
Lead Channel Setting Pacing Amplitude: 2 V
Lead Channel Setting Sensing Sensitivity: 5.6 mV
MDC IDC LEAD LOCATION: 753859
MDC IDC MSMT BATTERY IMPEDANCE: 201 Ohm
MDC IDC MSMT BATTERY REMAINING LONGEVITY: 140 mo
MDC IDC MSMT LEADCHNL RA PACING THRESHOLD AMPLITUDE: 0.75 V
MDC IDC MSMT LEADCHNL RA SENSING INTR AMPL: 2.8 mV
MDC IDC MSMT LEADCHNL RV SENSING INTR AMPL: 16 mV
MDC IDC SESS DTM: 20170301181326
MDC IDC SET LEADCHNL RV PACING AMPLITUDE: 2.5 V
MDC IDC SET LEADCHNL RV PACING PULSEWIDTH: 0.4 ms
MDC IDC STAT BRADY AP VP PERCENT: 0 %
MDC IDC STAT BRADY AS VS PERCENT: 100 %

## 2015-08-10 ENCOUNTER — Telehealth: Payer: Self-pay | Admitting: Neurology

## 2015-08-10 ENCOUNTER — Other Ambulatory Visit: Payer: Self-pay | Admitting: Neurology

## 2015-08-10 ENCOUNTER — Encounter: Payer: Self-pay | Admitting: Cardiology

## 2015-08-10 NOTE — Telephone Encounter (Signed)
[name] [name] 10/28/1955 Her daughter called saying that her mother has Medicare Complete and needs approval from Dr. [name] on one of the medications. Her call back number is [phone]. Thank you

## 2015-08-10 NOTE — Telephone Encounter (Signed)
Last OV: 07/02/15 Next OV: 01/04/16  Continue lamotrigine ER 300mg  twice daily

## 2015-08-11 NOTE — Telephone Encounter (Signed)
Daughter called back it was the LamoTRIgine 300 [place].

## 2015-08-11 NOTE — Telephone Encounter (Signed)
Attempted to reach daughter, left vm. Called and spoke to pharmacy to find out which medication needed P.A., however they do not open til 9. Will await a call back or will call pharmacy after opening.

## 2015-08-11 NOTE — Telephone Encounter (Signed)
P.A. Was initiated via CovermyMeds.

## 2015-08-24 ENCOUNTER — Encounter: Payer: Self-pay | Admitting: Cardiology

## 2015-09-06 DIAGNOSIS — M19012 Primary osteoarthritis, left shoulder: Secondary | ICD-10-CM | POA: Diagnosis not present

## 2015-09-29 ENCOUNTER — Ambulatory Visit (INDEPENDENT_AMBULATORY_CARE_PROVIDER_SITE_OTHER): Payer: Medicare Other | Admitting: *Deleted

## 2015-09-29 DIAGNOSIS — I495 Sick sinus syndrome: Secondary | ICD-10-CM

## 2015-09-29 DIAGNOSIS — Z950 Presence of cardiac pacemaker: Secondary | ICD-10-CM | POA: Diagnosis not present

## 2015-09-29 NOTE — Progress Notes (Signed)
Remote pacemaker transmission.   

## 2015-10-14 LAB — CUP PACEART REMOTE DEVICE CHECK
Battery Impedance: 225 Ohm
Battery Remaining Longevity: 136 mo
Battery Voltage: 2.78 V
Implantable Lead Implant Date: 20120409
Implantable Lead Location: 753859
Implantable Lead Location: 753860
Implantable Lead Model: 4076
Implantable Lead Model: 4076
Lead Channel Impedance Value: 581 Ohm
Lead Channel Impedance Value: 626 Ohm
MDC IDC LEAD IMPLANT DT: 20120409
MDC IDC SESS DTM: 20170531162346
MDC IDC SET LEADCHNL RA PACING AMPLITUDE: 2 V
MDC IDC SET LEADCHNL RV PACING AMPLITUDE: 2.5 V
MDC IDC SET LEADCHNL RV PACING PULSEWIDTH: 0.4 ms
MDC IDC SET LEADCHNL RV SENSING SENSITIVITY: 5.6 mV
MDC IDC STAT BRADY AP VP PERCENT: 0 %
MDC IDC STAT BRADY AP VS PERCENT: 0 %
MDC IDC STAT BRADY AS VP PERCENT: 0 %
MDC IDC STAT BRADY AS VS PERCENT: 100 %

## 2015-10-20 ENCOUNTER — Encounter: Payer: Self-pay | Admitting: Cardiology

## 2015-11-04 ENCOUNTER — Encounter: Payer: Self-pay | Admitting: Cardiology

## 2015-12-29 ENCOUNTER — Telehealth: Payer: Self-pay | Admitting: Cardiology

## 2015-12-29 ENCOUNTER — Ambulatory Visit (INDEPENDENT_AMBULATORY_CARE_PROVIDER_SITE_OTHER): Payer: Medicare Other | Admitting: *Deleted

## 2015-12-29 DIAGNOSIS — Z950 Presence of cardiac pacemaker: Secondary | ICD-10-CM

## 2015-12-29 DIAGNOSIS — I495 Sick sinus syndrome: Secondary | ICD-10-CM

## 2015-12-29 NOTE — Telephone Encounter (Signed)
Confirmed remote transmission w/ pt mother.   

## 2015-12-29 NOTE — Progress Notes (Signed)
Remote pacemaker transmission.   

## 2015-12-31 ENCOUNTER — Encounter: Payer: Self-pay | Admitting: Cardiology

## 2016-01-04 ENCOUNTER — Encounter: Payer: Self-pay | Admitting: Neurology

## 2016-01-04 ENCOUNTER — Ambulatory Visit (INDEPENDENT_AMBULATORY_CARE_PROVIDER_SITE_OTHER): Payer: Medicare Other | Admitting: Neurology

## 2016-01-04 VITALS — BP 122/86 | HR 74 | Wt 189.0 lb

## 2016-01-04 DIAGNOSIS — G40009 Localization-related (focal) (partial) idiopathic epilepsy and epileptic syndromes with seizures of localized onset, not intractable, without status epilepticus: Secondary | ICD-10-CM

## 2016-01-04 MED ORDER — OXCARBAZEPINE 300 MG PO TABS: ORAL_TABLET | ORAL | 5 refills | Status: DC

## 2016-01-04 NOTE — Progress Notes (Signed)
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  She is accompanied by an interpreter and her mother, who provides some history.      UPDATE: Current medications:  Lamotrigine ER 300mg  twice daily.  Trileptal 300mg  in AM and 600mg  in PM.    BMP from 07/02/15 showed Na 130, K 4.9, BUN 85 and Cr 9.  She had two falling spells (one in June or July and another one in August).  She felt lightheaded briefly but no real loss of consciousness.  Her remote transmission from her pacemaker was received and reportedly looked okay.   HISTORY:     Two types: 1) Staring spells, not fully respond, sometimes aware, tenses up but no convulsions, lasts seconds to a minute.  Last episode was in September 2014.   2) Drop attacks, loses conscious and falls.  Sometimes able to hold on to something.  She has had recurrence of drop attacks as well as dizzy spells for the past year.  It seems to have started following the death of her father last year.  The spells were well controlled up until that time.  She does endorse depression.  She has one spell a month, usually at the end of the month.  She had one at the end of December, in which she fell and hit her head.  She had a dizzy spell at the end of January.   Due to recurrence of drop attacks, she had another EEG performed on 06/17/14, which was normal.  Due to dizziness, trough levels of her antiepileptic medications were checked to look for toxicity, which were normal.  Levels were normal.     Prior medications:  She had side effects to Trileptal at 900mg .  Other medications tried, include Dilantin (ineffective), and immediate-release lamotrigine  (ineffective).   Prior studies: EEG: focal slowing and frequent spike and wave activity in left anterior temporal lobe. MRI Brain (04/13/97): focal area of encephalomalacia in posterior left temporal lobe. MRI Brain (10/19/08): possible left mesial temporal sclerosis and lesion in left posterior temporal lobe.   She does not drive.   When she was [age] years old, she developed a virus following a case of the chicken pox. 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  PAST MEDICAL HISTORY: Past Medical History:  Diagnosis Date  . Chicken pox   . Conductive hearing loss, childhood onset   . 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 Epilepsy, followed by Dr. Tomma Rakers in neurology 08/26/2012  . Pacemaker   . Pacemaker    2012  . Seizure ([place])   . Seizures ([place])    Petite Mal     MEDICATIONS: Current Outpatient Prescriptions on File Prior to Visit  Medication Sig Dispense Refill  . Ascorbic Acid (VITAMIN C PO) Take 1 tablet by mouth daily.     . Cholecalciferol (VITAMIN D) 2000 UNITS CAPS Take by mouth 2 (two) times daily.    . fish oil-omega-3 fatty acids 1000 MG capsule Take 2 g by mouth 2 (two) times daily.    . LamoTRIgine 300 MG TB24 TAKE ONE TABLET BY  MOUTH TWICE DAILY 60 tablet 5  . magnesium 30 MG tablet Take 30 mg by mouth daily.     . Multiple Vitamin (MULTIVITAMIN WITH MINERALS) TABS tablet Take 1 tablet by mouth daily.     No current facility-administered medications on file prior to visit.     ALLERGIES: No Known Allergies  FAMILY HISTORY: Family History  Problem Relation Age of Onset  . Diabetes Father   . Hypertension Father   . Hypertension Mother     SOCIAL HISTORY: Social History   Social History  . Marital status: Divorced    Spouse name: N/A  . Number of children: N/A  . Years of education: N/A   Occupational History  . Not on file.   Social History Main Topics  . Smoking status: Never Smoker    . Smokeless tobacco: Never Used  . Alcohol use No     Comment: none  . Drug use: No  . Sexual activity: No   Other Topics Concern  . Not on file   Social History Narrative   ** Merged History Encounter **       Work or School: on disability for the deafness, seizure and carpal tunnel  Home Situation: lives with mother and father currently - since 06/2012  Spiritual Beliefs: Christian  Lifestyle: walks for 15-20 minutes a few times per week, well balanced d   iet       REVIEW OF SYSTEMS: Constitutional: No fevers, chills, or sweats, no generalized fatigue, change in appetite Eyes: No visual changes, double vision, eye pain Ear, nose and throat: Deaf Cardiovascular: No chest pain, palpitations Respiratory:  No shortness of breath at rest or with exertion, wheezes GastrointestinaI: No nausea, vomiting, diarrhea, abdominal pain, fecal incontinence Genitourinary:  No dysuria, urinary retention or frequency Musculoskeletal:  No neck pain, back pain Integumentary: No rash, pruritus, skin lesions Neurological: as above Psychiatric: No depression, insomnia, anxiety Endocrine: No palpitations, fatigue, diaphoresis, mood swings, change in appetite, change in weight, increased thirst Hematologic/Lymphatic:  No purpura, petechiae. Allergic/Immunologic: no itchy/runny eyes, nasal congestion, recent allergic reactions, rashes  PHYSICAL EXAM: Vitals:   01/04/16 1103  BP: 122/86  Pulse: 74   General: No acute distress.  Patient appears well-groomed.  normal body habitus. Head:  Normocephalic/atraumatic Eyes:  Fundi examined but not visualized Neck: supple, no paraspinal tenderness, full range of motion Heart:  Regular rate and rhythm Lungs:  Clear to auscultation bilaterally Back: No paraspinal tenderness Neurological Exam: alert and oriented to person, place, and time. Attention span and concentration intact, recent and remote memory intact, fund of knowledge intact.  Speech  fluent and not dysarthric, language intact.  Deaf, otherwise CN II-XII intact. Bulk and tone normal, muscle strength 5/5 throughout.  Sensation to light touch intact.  Deep tendon reflexes 2+ throughout.  Finger to nose and heel to shin testing intact.  Gait normal. Romberg negative.  IMPRESSION: Localization-related epilepsy with complex partial seizures, stable Drop attacks.  Unclear etiology  PLAN: Continue current regimen Check CMP in 6 months prior to follow up Follow up in 6 months.  15 minutes spent face to face with patient, over 50% spent counseling.  Metta Clines, DO  CC: Colin Benton, DO

## 2016-01-04 NOTE — Patient Instructions (Signed)
1.  We will continue current medication:  Lamotrigine ER 300mg  twice daily and oxcarbazepine 300mg  in morning and 600mg  at bedtime.  I refilled the oxcarbazepine today. 2.  Recheck CMP in 6 months with follow up soon afterwards.

## 2016-01-05 LAB — CUP PACEART REMOTE DEVICE CHECK
Battery Impedance: 225 Ohm
Brady Statistic AP VS Percent: 0 %
Brady Statistic AS VP Percent: 0 %
Date Time Interrogation Session: 20170830145707
Implantable Lead Implant Date: 20120409
Implantable Lead Location: 753859
Implantable Lead Model: 4076
Implantable Lead Model: 4076
Lead Channel Pacing Threshold Amplitude: 0.75 V
Lead Channel Pacing Threshold Pulse Width: 0.4 ms
Lead Channel Setting Pacing Amplitude: 2.5 V
MDC IDC LEAD IMPLANT DT: 20120409
MDC IDC LEAD LOCATION: 753860
MDC IDC MSMT BATTERY REMAINING LONGEVITY: 136 mo
MDC IDC MSMT BATTERY VOLTAGE: 2.79 V
MDC IDC MSMT LEADCHNL RA IMPEDANCE VALUE: 590 Ohm
MDC IDC MSMT LEADCHNL RV IMPEDANCE VALUE: 641 Ohm
MDC IDC MSMT LEADCHNL RV PACING THRESHOLD AMPLITUDE: 0.5 V
MDC IDC MSMT LEADCHNL RV PACING THRESHOLD PULSEWIDTH: 0.4 ms
MDC IDC SET LEADCHNL RA PACING AMPLITUDE: 2 V
MDC IDC SET LEADCHNL RV PACING PULSEWIDTH: 0.4 ms
MDC IDC SET LEADCHNL RV SENSING SENSITIVITY: 5.6 mV
MDC IDC STAT BRADY AP VP PERCENT: 0 %
MDC IDC STAT BRADY AS VS PERCENT: 100 %

## 2016-01-14 ENCOUNTER — Encounter: Payer: Self-pay | Admitting: Cardiology

## 2016-01-19 ENCOUNTER — Ambulatory Visit (INDEPENDENT_AMBULATORY_CARE_PROVIDER_SITE_OTHER): Payer: Medicare Other | Admitting: Family Medicine

## 2016-01-19 DIAGNOSIS — Z23 Encounter for immunization: Secondary | ICD-10-CM

## 2016-02-05 ENCOUNTER — Other Ambulatory Visit: Payer: Self-pay | Admitting: Neurology

## 2016-02-07 NOTE — Telephone Encounter (Signed)
1.  We will continue current medication:  Lamotrigine ER 300mg  twice daily and oxcarbazepine 300mg  in morning and 600mg  at bedtime.  I refilled the oxcarbazepine today.

## 2016-02-09 ENCOUNTER — Other Ambulatory Visit: Payer: Self-pay

## 2016-02-09 MED ORDER — LAMOTRIGINE ER 300 MG PO TB24: 1.0000 | ORAL_TABLET | Freq: Two times a day (BID) | ORAL | 2 refills | Status: DC

## 2016-04-14 ENCOUNTER — Encounter: Payer: Self-pay | Admitting: Internal Medicine

## 2016-05-26 ENCOUNTER — Ambulatory Visit (INDEPENDENT_AMBULATORY_CARE_PROVIDER_SITE_OTHER): Payer: Medicare Other | Admitting: Internal Medicine

## 2016-05-26 ENCOUNTER — Encounter: Payer: Self-pay | Admitting: Internal Medicine

## 2016-05-26 VITALS — BP 145/80 | HR 69 | Ht 63.0 in | Wt 179.8 lb

## 2016-05-26 DIAGNOSIS — I495 Sick sinus syndrome: Secondary | ICD-10-CM

## 2016-05-26 DIAGNOSIS — Z950 Presence of cardiac pacemaker: Secondary | ICD-10-CM

## 2016-05-26 DIAGNOSIS — R55 Syncope and collapse: Secondary | ICD-10-CM | POA: Diagnosis not present

## 2016-05-26 LAB — CUP PACEART INCLINIC DEVICE CHECK
Battery Impedance: 248 Ohm
Battery Remaining Longevity: 132 mo
Battery Voltage: 2.78 V
Brady Statistic AP VS Percent: 0 %
Brady Statistic AS VP Percent: 0 %
Implantable Lead Implant Date: 20120409
Implantable Lead Location: 753860
Implantable Lead Model: 4076
Implantable Lead Model: 4076
Implantable Pulse Generator Implant Date: 20120409
Lead Channel Impedance Value: 537 Ohm
Lead Channel Impedance Value: 664 Ohm
Lead Channel Pacing Threshold Amplitude: 0.5 V
Lead Channel Pacing Threshold Pulse Width: 0.4 ms
Lead Channel Sensing Intrinsic Amplitude: 4 mV
Lead Channel Setting Pacing Amplitude: 2.5 V
Lead Channel Setting Sensing Sensitivity: 5.6 mV
MDC IDC LEAD IMPLANT DT: 20120409
MDC IDC LEAD LOCATION: 753859
MDC IDC MSMT LEADCHNL RA PACING THRESHOLD AMPLITUDE: 0.75 V
MDC IDC MSMT LEADCHNL RV PACING THRESHOLD PULSEWIDTH: 0.4 ms
MDC IDC MSMT LEADCHNL RV SENSING INTR AMPL: 22.4 mV
MDC IDC SESS DTM: 20180126104644
MDC IDC SET LEADCHNL RA PACING AMPLITUDE: 2 V
MDC IDC SET LEADCHNL RV PACING PULSEWIDTH: 0.4 ms
MDC IDC STAT BRADY AP VP PERCENT: 0 %
MDC IDC STAT BRADY AS VS PERCENT: 100 %

## 2016-05-26 NOTE — Patient Instructions (Addendum)
Medication Instructions:    Your physician recommends that you continue on your current medications as directed. Please refer to the Current Medication list given to you today.  --- If you need a refill on your cardiac medications before your next appointment, please call your pharmacy. ---  Labwork:  None ordered  Testing/Procedures:  None ordered  Follow-Up: Remote monitoring is used to monitor your Pacemaker of ICD from home. This monitoring reduces the number of office visits required to check your device to one time per year. It allows [place] to keep an eye on the functioning of your device to ensure it is working properly. You are scheduled for a device check from home on 08/28/2016. You may send your transmission at any time that day. If you have a wireless device, the transmission will be sent automatically. After your physician reviews your transmission, you will receive a postcard with your next transmission date.   Your physician wants you to follow-up in: 1 year with Tyson Babinski, PA.  You will receive a reminder letter in the mail two months in advance. If you don't receive a letter, please call our office to schedule the follow-up appointment.  Thank you for choosing [facility]!!

## 2016-05-26 NOTE — Progress Notes (Signed)
Patient Care Team: Lucretia Kern, DO as PCP - General (Family Medicine)   HPI  [name] [name] is a 61 y.o. female Seen in followup for  pacemaker implanted  for asystole during tilt table testin in 2012 in [place]  She is deaf.  She has had no recurrent syncope; rate drop response was activated at last visit   She has had spells her whole life long; medication was initiated by neurology with improvement   The patient denies chest pain, shortness of breath, nocturnal dyspnea, orthopnea or peripheral edema.  There have been no palpitations, lightheadedness or syncope.         Past Medical History:  Diagnosis Date  . Chicken pox   . Conductive hearing loss, childhood onset   . 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 Epilepsy, followed by Dr. Tomma Rakers in neurology 08/26/2012  . Pacemaker   . Pacemaker    2012  . Seizure ([place])   . Seizures ([place])    Petite Mal     Past Surgical History:  Procedure Laterality Date  . HIP ARTHROPLASTY    . PACEMAKER INSERTION  07/2010  . TOTAL HIP ARTHROPLASTY  07/2011   right  . TOTAL HIP ARTHROPLASTY  03/2012   left    Current Outpatient Prescriptions  Medication Sig Dispense Refill  . Ascorbic Acid (VITAMIN C PO) Take 1 tablet by mouth daily.     . Cholecalciferol (VITAMIN D) 2000 UNITS CAPS Take by mouth 2 (two) times daily.    . fish oil-omega-3 fatty acids 1000 MG capsule Take 2 g by mouth 2 (two) times daily.    . LamoTRIgine 300 MG TB24 Take 1 tablet (300 mg total) by mouth 2 (two) times daily. 180 tablet 2  . magnesium 30 MG tablet Take 30 mg by mouth daily.     . Multiple Vitamin (MULTIVITAMIN WITH MINERALS) TABS tablet Take 1 tablet by mouth daily.    . Oxcarbazepine (TRILEPTAL) 300 MG tablet TAKE ONE TABLET BY MOUTH IN THE MORNING AND TWO IN THE EVENING 90 tablet 5   No current facility-administered medications for this visit.     No Known Allergies  Review of Systems negative except from HPI and  PMH  Physical Exam BP (!) 145/80   Pulse 69   Ht 5\' 3"  (1.6 m)   Wt 179 lb 12.8 oz (81.6 kg)   SpO2 97%   BMI 31.85 kg/m  Well developed and nourished in no acute distress HENT normal Neck supple with JVP-flat Clear Device pocket well healed; without hematoma or erythema.  There is no tethering  Regular rate and rhythm, no murmurs or gallops Abd-soft with active BS No Clubbing cyanosis edema There is tenderness at her left posterior shoulder Skin-warm and dry A & Oriented  Grossly normal sensory and motor function      Assessment and  Plan  Syncope  Sinus node dysfunction  Pacemaker-[employer] The patient's device was interrogated.  The information was reviewed. No changes were made in the programming.    Seizures  No intercurrent syncope  Doing well

## 2016-06-23 ENCOUNTER — Ambulatory Visit (INDEPENDENT_AMBULATORY_CARE_PROVIDER_SITE_OTHER): Payer: Medicare Other | Admitting: Family Medicine

## 2016-06-23 ENCOUNTER — Encounter: Payer: Self-pay | Admitting: Family Medicine

## 2016-06-23 VITALS — BP 110/80 | HR 77 | Temp 99.6°F | Ht 63.0 in | Wt 177.9 lb

## 2016-06-23 DIAGNOSIS — J111 Influenza due to unidentified influenza virus with other respiratory manifestations: Secondary | ICD-10-CM | POA: Diagnosis not present

## 2016-06-23 MED ORDER — BENZONATATE 100 MG PO CAPS: 100.0000 mg | ORAL_CAPSULE | Freq: Three times a day (TID) | ORAL | 0 refills | Status: DC | PRN

## 2016-06-23 MED ORDER — OSELTAMIVIR PHOSPHATE 75 MG PO CAPS: 75.0000 mg | ORAL_CAPSULE | Freq: Two times a day (BID) | ORAL | 0 refills | Status: DC

## 2016-06-23 NOTE — Patient Instructions (Signed)
Your symptoms suggest that you likely have a viral respiratory illness or the flu.  I sent the tessalon for cough and Tamiflu for flu that we discussed to the pharmacy.  I hope you feel better soon. If you start to feel worse, have difficulty breathing or are not getting better as expected please seek medical care immediately.   Influenza, Adult Influenza, more commonly known as "the flu," is a viral infection that primarily affects the respiratory tract. The respiratory tract includes organs that help you breathe, such as the lungs, nose, and throat. The flu causes many common cold symptoms, as well as a high fever and body aches. The flu spreads easily from person to person (is contagious). Getting a flu shot (influenza vaccination) every year is the best way to prevent influenza. What are the causes? Influenza is caused by a virus. You can catch the virus by:  Breathing in droplets from an infected person's cough or sneeze.  Touching something that was recently contaminated with the virus and then touching your mouth, nose, or eyes. What increases the risk? The following factors may make you more likely to get the flu:  Not cleaning your hands frequently with soap and water or alcohol-based hand sanitizer.  Having close contact with many people during cold and flu season.  Touching your mouth, eyes, or nose without washing or sanitizing your hands first.  Not drinking enough fluids or not eating a healthy diet.  Not getting enough sleep or exercise.  Being under a high amount of stress.  Not getting a yearly (annual) flu shot. You may be at a higher risk of complications from the flu, such as a severe lung infection (pneumonia), if you:  Are over the age of 32.  Are pregnant.  Have a weakened disease-fighting system (immune system). You may have a weakened immune system if you:  Have HIV or AIDS.  Are undergoing chemotherapy.  Aretaking medicines that reduce the  activity of (suppress) the immune system.  Have a long-term (chronic) illness, such as heart disease, kidney disease, diabetes, or lung disease.  Have a liver disorder.  Are obese.  Have anemia. What are the signs or symptoms? Symptoms of this condition typically last 4-10 days and may include:  Fever.  Chills.  Headache, body aches, or muscle aches.  Sore throat.  Cough.  Runny or congested nose.  Chest discomfort and cough.  Poor appetite.  Weakness or tiredness (fatigue).  Dizziness.  Nausea or vomiting. How is this diagnosed? This condition may be diagnosed based on your medical history and a physical exam. Your health care provider may do a nose or throat swab test to confirm the diagnosis. How is this treated? If influenza is detected early, you can be treated with antiviral medicine that can reduce the length of your illness and the severity of your symptoms. This medicine may be given by mouth (orally) or through an IV tube that is inserted in one of your veins. The goal of treatment is to relieve symptoms by taking care of yourself at home. This may include taking over-the-counter medicines, drinking plenty of fluids, and adding humidity to the air in your home. In some cases, influenza goes away on its own. Severe influenza or complications from influenza may be treated in a hospital. Follow these instructions at home:  Take over-the-counter and prescription medicines only as told by your health care provider.  Use a cool mist humidifier to add humidity to the air in your home.  This can make breathing easier.  Rest as needed.  Drink enough fluid to keep your urine clear or pale yellow.  Cover your mouth and nose when you cough or sneeze.  Wash your hands with soap and water often, especially after you cough or sneeze. If soap and water are not available, use hand sanitizer.  Stay home from work or school as told by your health care provider. Unless you  are visiting your health care provider, try to avoid leaving home until your fever has been gone for 24 hours without the use of medicine.  Keep all follow-up visits as told by your health care provider. This is important. How is this prevented?  Getting an annual flu shot is the best way to avoid getting the flu. You may get the flu shot in late summer, fall, or winter. Ask your health care provider when you should get your flu shot.  Wash your hands often or use hand sanitizer often.  Avoid contact with people who are sick during cold and flu season.  Eat a healthy diet, drink plenty of fluids, get enough sleep, and exercise regularly. Contact a health care provider if:  You develop new symptoms.  You have:  Chest pain.  Diarrhea.  A fever.  Your cough gets worse.  You produce more mucus.  You feel nauseous or you vomit. Get help right away if:  You develop shortness of breath or difficulty breathing.  Your skin or nails turn a bluish color.  You have severe pain or stiffness in your neck.  You develop a sudden headache or sudden pain in your face or ear.  You cannot stop vomiting. This information is not intended to replace advice given to you by your health care provider. Make sure you discuss any questions you have with your health care provider. Document Released: 04/14/2000 Document Revised: 09/23/2015 Document Reviewed: 02/09/2015 Elsevier Interactive Patient Education  2017 [employer].

## 2016-06-23 NOTE — Progress Notes (Signed)
HPI:  Interpreter present.  URI: -started: last night -symptoms:nasal congestion, sore throat, cough, PND, nausea, threw up once last night - she thinks from mucus, headache, fatigue and low grade elevated temp around 100 -mother with the same symptoms for last 3 days -denies:SOB, weakness, palpitations, DOE, exertional or persistent check pain, tooth pain, swelling, diarrhea, recurrent emesis, inability to tolerate oral intake -has tried: nothing  ROS: See pertinent positives and negatives per HPI.  Past Medical History:  Diagnosis Date  . Chicken pox   . Conductive hearing loss, childhood onset   . 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 Epilepsy, followed by Dr. Tomma Rakers in neurology 08/26/2012  . Pacemaker   . Pacemaker    2012  . Seizure ([place])   . 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 HIP ARTHROPLASTY    . PACEMAKER INSERTION  07/2010  . TOTAL HIP ARTHROPLASTY  07/2011   right  . TOTAL HIP ARTHROPLASTY  03/2012   left    Family History  Problem Relation Age of Onset  . Diabetes Father   . Hypertension Father   . Hypertension Mother     Social History   Social History  . Marital status: Divorced    Spouse name: N/A  . Number of children: N/A  . Years of education: N/A   Social History Main Topics  . Smoking status: Never Smoker  . Smokeless tobacco: Never Used  . Alcohol use No     Comment: none  . Drug use: No  . Sexual activity: No   Other Topics Concern  . None   Social History Narrative   ** Merged History Encounter **       Work or School: on disability for the deafness, seizure and carpal tunnel  Home Situation: lives with mother and father currently - since 06/2012  Spiritual Beliefs: Christian  Lifestyle: walks for 15-20 minutes a few times per week, well balanced d   iet        Current Outpatient Prescriptions:  .  Ascorbic Acid (VITAMIN C PO), Take 1 tablet by  mouth daily. , Disp: , Rfl:  .  Cholecalciferol (VITAMIN D) 2000 UNITS CAPS, Take by mouth 2 (two) times daily., Disp: , Rfl:  .  fish oil-omega-3 fatty acids 1000 MG capsule, Take 2 g by mouth 2 (two) times daily., Disp: , Rfl:  .  LamoTRIgine 300 MG TB24, Take 1 tablet (300 mg total) by mouth 2 (two) times daily., Disp: 180 tablet, Rfl: 2 .  magnesium 30 MG tablet, Take 30 mg by mouth daily. , Disp: , Rfl:  .  Multiple Vitamin (MULTIVITAMIN WITH MINERALS) TABS tablet, Take 1 tablet by mouth daily., Disp: , Rfl:  .  Oxcarbazepine (TRILEPTAL) 300 MG tablet, TAKE ONE TABLET BY MOUTH IN THE MORNING AND TWO IN THE EVENING, Disp: 90 tablet, Rfl: 5 .  benzonatate (TESSALON PERLES) 100 MG capsule, Take 1 capsule (100 mg total) by mouth 3 (three) times daily as needed., Disp: 20 capsule, Rfl: 0 .  oseltamivir (TAMIFLU) 75 MG capsule, Take 1 capsule (75 mg total) by mouth 2 (two) times daily., Disp: 10 capsule, Rfl: 0  EXAM:  Vitals:   06/23/16 1327  BP: 110/80  Pulse: 77  Temp: 99.6 F (37.6 C)    Body mass index is 31.51 kg/m.  GENERAL: vitals reviewed and listed above, alert,  oriented, appears well hydrated and in no acute distress  HEENT: atraumatic, conjunttiva clear, no obvious abnormalities on inspection of external nose and ears, normal appearance of ear canals and TMs, clear nasal congestion, mild post oropharyngeal erythema with PND, no tonsillar edema or exudate, no sinus TTP  NECK: no obvious masses on inspection  LUNGS: clear to auscultation bilaterally, no wheezes, rales or rhonchi, good air movement  CV: HRRR, no peripheral edema  MS: moves all extremities without noticeable abnormality  PSYCH: pleasant and cooperative, no obvious depression or anxiety  ASSESSMENT AND PLAN:  Discussed the following assessment and plan:  Influenza  We discussed potential/likely etiologies, with influenza being likely or possible vs. viral infection or other. We discussed  risks/benefits/indications/best timing for tamiflu, likely course, transmission, potential complications, signs of developing a serious illness and return and emergency precuations. She declined flu testing but did want to start tamiflu and is aware of risks. Rx for this and tessalon sent. Advised of ucc and ER options for follow up over the weekend if needed. Interpreter was present for this visit. Patient Instructions  Your symptoms suggest that you likely have a viral respiratory illness or the flu.  I sent the tessalon for cough and Tamiflu for flu that we discussed to the pharmacy.  I hope you feel better soon. If you start to feel worse, have difficulty breathing or are not getting better as expected please seek medical care immediately.   Influenza, Adult Influenza, more commonly known as "the flu," is a viral infection that primarily affects the respiratory tract. The respiratory tract includes organs that help you breathe, such as the lungs, nose, and throat. The flu causes many common cold symptoms, as well as a high fever and body aches. The flu spreads easily from person to person (is contagious). Getting a flu shot (influenza vaccination) every year is the best way to prevent influenza. What are the causes? Influenza is caused by a virus. You can catch the virus by:  Breathing in droplets from an infected person's cough or sneeze.  Touching something that was recently contaminated with the virus and then touching your mouth, nose, or eyes. What increases the risk? The following factors may make you more likely to get the flu:  Not cleaning your hands frequently with soap and water or alcohol-based hand sanitizer.  Having close contact with many people during cold and flu season.  Touching your mouth, eyes, or nose without washing or sanitizing your hands first.  Not drinking enough fluids or not eating a healthy diet.  Not getting enough sleep or exercise.  Being under a  high amount of stress.  Not getting a yearly (annual) flu shot. You may be at a higher risk of complications from the flu, such as a severe lung infection (pneumonia), if you:  Are over the age of 71.  Are pregnant.  Have a weakened disease-fighting system (immune system). You may have a weakened immune system if you:  Have HIV or AIDS.  Are undergoing chemotherapy.  Aretaking medicines that reduce the activity of (suppress) the immune system.  Have a long-term (chronic) illness, such as heart disease, kidney disease, diabetes, or lung disease.  Have a liver disorder.  Are obese.  Have anemia. What are the signs or symptoms? Symptoms of this condition typically last 4-10 days and may include:  Fever.  Chills.  Headache, body aches, or muscle aches.  Sore throat.  Cough.  Runny or congested nose.  Chest discomfort and  cough.  Poor appetite.  Weakness or tiredness (fatigue).  Dizziness.  Nausea or vomiting. How is this diagnosed? This condition may be diagnosed based on your medical history and a physical exam. Your health care provider may do a nose or throat swab test to confirm the diagnosis. How is this treated? If influenza is detected early, you can be treated with antiviral medicine that can reduce the length of your illness and the severity of your symptoms. This medicine may be given by mouth (orally) or through an IV tube that is inserted in one of your veins. The goal of treatment is to relieve symptoms by taking care of yourself at home. This may include taking over-the-counter medicines, drinking plenty of fluids, and adding humidity to the air in your home. In some cases, influenza goes away on its own. Severe influenza or complications from influenza may be treated in a hospital. Follow these instructions at home:  Take over-the-counter and prescription medicines only as told by your health care provider.  Use a cool mist humidifier to add  humidity to the air in your home. This can make breathing easier.  Rest as needed.  Drink enough fluid to keep your urine clear or pale yellow.  Cover your mouth and nose when you cough or sneeze.  Wash your hands with soap and water often, especially after you cough or sneeze. If soap and water are not available, use hand sanitizer.  Stay home from work or school as told by your health care provider. Unless you are visiting your health care provider, try to avoid leaving home until your fever has been gone for 24 hours without the use of medicine.  Keep all follow-up visits as told by your health care provider. This is important. How is this prevented?  Getting an annual flu shot is the best way to avoid getting the flu. You may get the flu shot in late summer, fall, or winter. Ask your health care provider when you should get your flu shot.  Wash your hands often or use hand sanitizer often.  Avoid contact with people who are sick during cold and flu season.  Eat a healthy diet, drink plenty of fluids, get enough sleep, and exercise regularly. Contact a health care provider if:  You develop new symptoms.  You have:  Chest pain.  Diarrhea.  A fever.  Your cough gets worse.  You produce more mucus.  You feel nauseous or you vomit. Get help right away if:  You develop shortness of breath or difficulty breathing.  Your skin or nails turn a bluish color.  You have severe pain or stiffness in your neck.  You develop a sudden headache or sudden pain in your face or ear.  You cannot stop vomiting. This information is not intended to replace advice given to you by your health care provider. Make sure you discuss any questions you have with your health care provider. Document Released: 04/14/2000 Document Revised: 09/23/2015 Document Reviewed: 02/09/2015 Elsevier Interactive Patient Education  2017 [place]., DO

## 2016-06-23 NOTE — Progress Notes (Signed)
Pre visit review using our clinic review tool, if applicable. No additional management support is needed unless otherwise documented below in the visit note. 

## 2016-07-03 ENCOUNTER — Telehealth: Payer: Self-pay

## 2016-07-03 ENCOUNTER — Other Ambulatory Visit: Payer: Medicare Other

## 2016-07-03 DIAGNOSIS — G40001 Localization-related (focal) (partial) idiopathic epilepsy and epileptic syndromes with seizures of localized onset, not intractable, with status epilepticus: Secondary | ICD-10-CM

## 2016-07-03 LAB — COMPREHENSIVE METABOLIC PANEL
ALT: 19 U/L (ref 6–29)
AST: 21 U/L (ref 10–35)
Albumin: 4.3 g/dL (ref 3.6–5.1)
Alkaline Phosphatase: 83 U/L (ref 33–130)
BUN: 8 mg/dL (ref 7–25)
CO2: 26 mmol/L (ref 20–31)
Calcium: 9.8 mg/dL (ref 8.6–10.4)
Chloride: 95 mmol/L — ABNORMAL LOW (ref 98–110)
Creat: 0.7 mg/dL (ref 0.50–0.99)
GLUCOSE: 92 mg/dL (ref 65–99)
Potassium: 5 mmol/L (ref 3.5–5.3)
Sodium: 131 mmol/L — ABNORMAL LOW (ref 135–146)
Total Bilirubin: 0.4 mg/dL (ref 0.2–1.2)
Total Protein: 6.9 g/dL (ref 6.1–8.1)

## 2016-07-03 NOTE — Telephone Encounter (Signed)
Order placed for prelabs

## 2016-07-10 ENCOUNTER — Encounter: Payer: Self-pay | Admitting: Neurology

## 2016-07-10 ENCOUNTER — Ambulatory Visit (INDEPENDENT_AMBULATORY_CARE_PROVIDER_SITE_OTHER): Payer: Medicare Other | Admitting: Neurology

## 2016-07-10 VITALS — BP 152/88 | HR 68 | Ht 63.0 in | Wt 176.0 lb

## 2016-07-10 DIAGNOSIS — R030 Elevated blood-pressure reading, without diagnosis of hypertension: Secondary | ICD-10-CM

## 2016-07-10 DIAGNOSIS — R55 Syncope and collapse: Secondary | ICD-10-CM | POA: Diagnosis not present

## 2016-07-10 DIAGNOSIS — G40009 Localization-related (focal) (partial) idiopathic epilepsy and epileptic syndromes with seizures of localized onset, not intractable, without status epilepticus: Secondary | ICD-10-CM

## 2016-07-10 MED ORDER — OXCARBAZEPINE 300 MG PO TABS: ORAL_TABLET | ORAL | 2 refills | Status: DC

## 2016-07-10 MED ORDER — LAMOTRIGINE ER 300 MG PO TB24: 1.0000 | ORAL_TABLET | Freq: Two times a day (BID) | ORAL | 2 refills | Status: DC

## 2016-07-10 MED ORDER — OXCARBAZEPINE 300 MG PO TABS: ORAL_TABLET | ORAL | 5 refills | Status: DC

## 2016-07-10 NOTE — Patient Instructions (Signed)
1.  Continue Lamotrigine ER 300mg  twice daily and oxcarbazepine 300mg  in morning and 600mg  at bedtime. 2.  Follow up in 6 months.  Repeat CBC and CMP in 6 months prior to follow up.

## 2016-07-10 NOTE — Progress Notes (Signed)
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  She is accompanied by an interpreter and her mother, who provides some history.      UPDATE: Current medications:  Lamotrigine ER 300mg  twice daily.  Trileptal 300mg  in AM and 600mg  in PM.    CMP from 07/03/16 reveal Na of 131 (stable), K 5, Cl 95, CO2 26, glucose 92, BUN 8, Cr 0.70, total bili 0.4, ALP 83, AST 21 and ALT 19.   She is doing well.  No spells.   HISTORY:     Two types: 1) Staring spells, not fully respond, sometimes aware, tenses up but no convulsions, lasts seconds to a minute.  Last episode was in September 2014.   2) Drop attacks, loses conscious and falls.  Sometimes able to hold on to something.  She has had recurrence of drop attacks as well as dizzy spells for the past year.  It seems to have started following the death of her father last year.  The spells were well controlled up until that time.  She does endorse depression.  She has one spell a month, usually at the end of the month.  She had one at the end of December, in which she fell and hit her head.  She had a dizzy spell at the end of January.   Due to recurrence of drop attacks, she had another EEG performed on 06/17/14, which was normal.  Due to dizziness, trough levels of her antiepileptic medications were checked to look for toxicity, which were normal.  Levels were normal.     Prior medications:  She had side effects to Trileptal at 900mg .  Other medications tried, include Dilantin (ineffective), and immediate-release lamotrigine (ineffective).   Prior studies: EEG: focal slowing and frequent spike and wave activity in left anterior temporal  lobe. MRI Brain (04/13/97): focal area of encephalomalacia in posterior left temporal lobe. MRI Brain (10/19/08): possible left mesial temporal sclerosis and lesion in left posterior temporal lobe.   She does not drive.   When she was [age] years old, she developed a virus following a case of the chicken pox. 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  PAST MEDICAL HISTORY: Past Medical History:  Diagnosis Date  . Chicken pox   . Conductive hearing loss, childhood onset   . 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 Epilepsy, followed by Dr. Tomma Rakers in neurology 08/26/2012  . Pacemaker   . Pacemaker    2012  . Seizure ([place])   . 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 Ascorbic Acid (VITAMIN C PO) Take 1 tablet by mouth daily.     . Cholecalciferol (VITAMIN D) 2000 UNITS CAPS Take by mouth 2 (two) times daily.    . fish oil-omega-3 fatty acids 1000 MG capsule Take 2 g by mouth 2 (two) times daily.    . magnesium 30 MG tablet Take 30 mg by mouth daily.     . Multiple Vitamin (MULTIVITAMIN WITH MINERALS) TABS tablet  Take 1 tablet by mouth daily.    . benzonatate (TESSALON PERLES) 100 MG capsule Take 1 capsule (100 mg total) by mouth 3 (three) times daily as needed. (Patient not taking: Reported on 07/10/2016) 20 capsule 0  . oseltamivir (TAMIFLU) 75 MG capsule Take 1 capsule (75 mg total) by mouth 2 (two) times daily. (Patient not taking: Reported on 07/10/2016) 10 capsule 0   No current facility-administered medications on file prior to visit.     ALLERGIES: No Known Allergies  FAMILY HISTORY: Family History  Problem Relation Age of Onset  . Diabetes Father   . Hypertension Father   . Hypertension Mother     SOCIAL HISTORY: Social History   Social History  . Marital status: Divorced    Spouse name: N/A  . Number of children:  N/A  . Years of education: N/A   Occupational History  . Not on file.   Social History Main Topics  . Smoking status: Never Smoker  . Smokeless tobacco: Never Used  . Alcohol use No     Comment: none  . Drug use: No  . Sexual activity: No   Other Topics Concern  . Not on file   Social History Narrative   ** Merged History Encounter **       Work or School: on disability for the deafness, seizure and carpal tunnel  Home Situation: lives with mother and father currently - since 06/2012  Spiritual Beliefs: Christian  Lifestyle: walks for 15-20 minutes a few times per week, well balanced d   iet       REVIEW OF SYSTEMS: Constitutional: No fevers, chills, or sweats, no generalized fatigue, change in appetite Eyes: No visual changes, double vision, eye pain Ear, nose and throat: No hearing loss, ear pain, nasal congestion, sore throat Cardiovascular: No chest pain, palpitations Respiratory:  No shortness of breath at rest or with exertion, wheezes GastrointestinaI: No nausea, vomiting, diarrhea, abdominal pain, fecal incontinence Genitourinary:  No dysuria, urinary retention or frequency Musculoskeletal:  No neck pain, back pain Integumentary: No rash, pruritus, skin lesions Neurological: as above Psychiatric: No depression, insomnia, anxiety Endocrine: No palpitations, fatigue, diaphoresis, mood swings, change in appetite, change in weight, increased thirst Hematologic/Lymphatic:  No purpura, petechiae. Allergic/Immunologic: no itchy/runny eyes, nasal congestion, recent allergic reactions, rashes  PHYSICAL EXAM: Vitals:   07/10/16 1036 07/10/16 1110  BP: (!) 158/90 (!) 152/88  Pulse: 68    General: No acute distress.  Patient appears well-groomed.   Head:  Normocephalic/atraumatic Eyes:  Fundi examined but not visualized Neck: supple, no paraspinal tenderness, full range of motion Heart:  Regular rate and rhythm Lungs:  Clear to auscultation bilaterally Back: No  paraspinal tenderness Neurological Exam: alert and oriented to person, place, and time. Attention span and concentration intact, recent and remote memory intact, fund of knowledge intact.  Speech fluent and not dysarthric, language intact.  Deaf, otherwise CN II-XII intact. Bulk and tone normal, muscle strength 5/5 throughout.  Sensation to light touch intact.  Deep tendon reflexes 2+ throughout.  Finger to nose and heel to shin testing intact.  Gait normal. Romberg negative.  IMPRESSION: Localization-related epilepsy with complex partial seizures, stable Drop attacks.  Unclear etiology Elevated blood pressure  PLAN: 1.  Continue Lamictal ER 300mg  twice daily and Trileptal 300mg  in AM and 600mg  at night. 2.  Repeat CBC and CMP in 6 months with follow up soon afterwards. 3.  Follow up with PCP or cardiology regarding blood pressure  15 minutes  spent face to face with patient, over 50% spent discussing management.  Metta Clines, DO  CC:  Colin Benton, DO

## 2016-07-31 ENCOUNTER — Encounter: Payer: Self-pay | Admitting: Family Medicine

## 2016-08-13 NOTE — Progress Notes (Signed)
Medicare Annual Preventive Care Visit  (initial annual wellness or annual wellness exam)  Concerns and/or follow up today: Interpreter present. 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 Sees neurologist, cardiologist and gynecologist. Due for labs, mammo, ? Pap, hep c screen. She agrees is late on her gyn exam and agrees to schedule. We offered to schedule for her but she insists on having her mother call. Sees Dr. Ulanda Edison - but plans to transfer to a female doctor in that practice. Does not want to do or review advanced directives today - agrees to discuss with her mother. She has improved her diet and is working on walking 30-60 minutes daily and is very happy as she feels she has lost some wait. Declines all other measures. Has chronic mild constipation. Resolves with prune juice. No bleeding, abd pain, change in bowels, shoestring bowel. Refuses colon cancer screening but agrees to consider cologard test.  She is resistant to most screening measures. Refuses for [place] to order anything other then cholesterol check today. Had CMP with specialist. See scanned documentation for further details of HPI.  ROS: negative for report of fevers, unintentional weight loss, vision changes, vision loss, hearing loss or change, chest pain, sob, hemoptysis, melena, hematochezia, hematuria, genital discharge or lesions, falls, bleeding or bruising, loc, thoughts of suicide or self harm, memory loss  1.) Patient-completed health risk assessment  - completed and reviewed, see scanned documentation  2.) Review of Medical History: -PMH, PSH, Family History and current specialty and care providers reviewed and updated and listed below  - see scanned in document in chart and below  Past Medical History:  Diagnosis Date  . Chicken pox   . Conductive hearing loss, childhood onset   . Deafness    from medication a a  child for chicken pox  . Epilepsy, followed by Dr. Tomma Rakers in neurology 08/26/2012  . Pacemaker   . Pacemaker    2012  . Seizure ([place])   . Seizures ([place])    [place]     Past Surgical History:  Procedure Laterality Date  . HIP ARTHROPLASTY    . PACEMAKER INSERTION  07/2010  . TOTAL HIP ARTHROPLASTY  07/2011   right  . TOTAL HIP ARTHROPLASTY  03/2012   left    Social History   Social History  . Marital status: Divorced    Spouse name: N/A  . Number of children: N/A  . Years of education: N/A   Occupational History  . Not on file.   Social History Main Topics  . Smoking status: Never Smoker  . Smokeless tobacco: Never Used  . Alcohol use No     Comment: none  . Drug use: No  . Sexual activity: No   Other Topics Concern  . Not on file   Social History Narrative   ** Merged History Encounter **       Work or School: on disability for the deafness, seizure and carpal tunnel  Home Situation: lives with mother and father currently - since 06/2012  Spiritual Beliefs: Christian  Lifestyle: walks for 15-20 minutes a few times per week, well balanced d   iet       Family History  Problem Relation Age of Onset  . Diabetes Father   . Hypertension Father   . Hypertension Mother     Current Outpatient Prescriptions on File Prior to Visit  Medication Sig Dispense Refill  .  Ascorbic Acid (VITAMIN C PO) Take 1 tablet by mouth daily.     . Cholecalciferol (VITAMIN D) 2000 UNITS CAPS Take by mouth 2 (two) times daily.    . fish oil-omega-3 fatty acids 1000 MG capsule Take 2 g by mouth 2 (two) times daily.    . LamoTRIgine 300 MG TB24 Take 1 tablet (300 mg total) by mouth 2 (two) times daily. 180 tablet 2  . magnesium 30 MG tablet Take 30 mg by mouth daily.     . Multiple Vitamin (MULTIVITAMIN WITH MINERALS) TABS tablet Take 1 tablet by mouth daily.    Marland Kitchen oseltamivir (TAMIFLU) 75 MG capsule Take 1 capsule (75 mg total) by mouth 2 (two) times daily. 10 capsule 0  .  Oxcarbazepine (TRILEPTAL) 300 MG tablet TAKE ONE TABLET BY MOUTH IN THE MORNING AND TWO IN THE EVENING 270 tablet 2   No current facility-administered medications on file prior to visit.      3.) Review of functional ability and level of safety:  Any difficulty hearing?  See scanned documentation - deff  History of falling? x1 since last visit, felt off balance -reports saw cardiology, neurology and ortho about this  Any trouble with IADLs - using a phone, using transportation, grocery shopping, preparing meals, doing housework, doing laundry, taking medications and managing money?  See scanned documentation  Advance Directives?  Discussed briefly and offered more resources and detailed discussion with our trained staff.   See summary of recommendations in Patient Instructions below.  4.) Physical Exam Vitals:   08/14/16 1123  BP: 126/80  Pulse: 61  Temp: 97.9 F (36.6 C)   Estimated body mass index is 31.95 kg/m as calculated from the following:   Height as of this encounter: 5' 2.25" (1.581 m).   Weight as of this encounter: 176 lb 1.6 oz (79.9 kg).  EKG (optional): deferred  General: alert, appear well hydrated and in no acute distress  HEENT: visual acuity grossly intact  CV: HRRR  Lungs: CTA bilaterally  Psych: pleasant and cooperative, no obvious depression or anxiety  Cognitive function grossly intact  See patient instructions for recommendations.  Education and counseling regarding the above review of health provided with a plan for the following: -see scanned patient completed form for further details -fall prevention strategies discussed  -healthy lifestyle discussed -importance and resources for completing advanced directives discussed -see patient instructions below for any other recommendations provided  4)The following written screening schedule of preventive measures were reviewed with assessment and plan made per below, orders and patient  instructions:      AAA screening done if applicable     Alcohol screening done     Obesity Screening and counseling done     STI screening (Hep C if born [birth year]) offered and per pt wishes     Tobacco Screening done        Pneumococcal (PPSV23 -one dose after 64, one before if risk factors), influenza yearly and hepatitis B vaccines (if high risk - end stage renal disease, IV drugs, homosexual men, live in home for mentally retarded, hemophilia receiving factors) ASSESSMENT/PLAN: done if applicable      Screening mammograph (yearly if >40) ASSESSMENT/PLAN: offered to order, refused      Screening Pap smear/pelvic exam (q2 years) ASSESSMENT/PLAN: pt refuses but agrees to call gyn, refused our offer to schedule gyn visit for her      Colorectal cancer screening (FOBT yearly or flex sig q4y or colonoscopy q10y or barium  enema q4y) ASSESSMENT/PLAN: advised and lengthy discussion - she refused again, she agrees to consider cologard, information provided      Diabetes outpatient self-management training services ASSESSMENT/PLAN: had CMP with cardiologist recently      Bone mass measurements(covered q2y if indicated - estrogen def, osteoporosis, hyperparathyroid, vertebral abnormalities, osteoporosis or steroids) ASSESSMENT/PLAN: utd or discussed and ordered per pt wishes      Screening for glaucoma(q1y if high risk - diabetes, FH, AA and > 3 or hispanic and > 65) ASSESSMENT/PLAN: see eye doc for eye checks       Medical nutritional therapy for individuals with diabetes or renal disease ASSESSMENT/PLAN: see orders      Cardiovascular screening blood tests (lipids q5y) ASSESSMENT/PLAN: see orders and labs - not fasting      Diabetes screening tests ASSESSMENT/PLAN: see orders and labs   7.) Summary: -risk factors and conditions per above assessment were discussed and treatment, recommendations and referrals were offered per documentation above and orders and patient  instructions. -she refuses most measures - discussed all at length and rational for recommended measures, she seems more open to considering this year -congratulated on healthier diet, exercise and wt reduction -will continue to encourage and educate and offer help with all recommended measures -bowel regimen for constipation discussed -fall precuations  Medicare annual wellness visit, subsequent  Bilateral deafness  Sinus node dysfunction (HCC)  Partial epilepsy with impairment of consciousness (HCC)  Constipation, unspecified constipation type  Hyperlipidemia, unspecified hyperlipidemia type - Plan: HDL cholesterol, Cholesterol, total  There are no Patient Instructions on file for this visit.  Colin Benton R., DO

## 2016-08-14 ENCOUNTER — Encounter: Payer: Self-pay | Admitting: Family Medicine

## 2016-08-14 ENCOUNTER — Ambulatory Visit (INDEPENDENT_AMBULATORY_CARE_PROVIDER_SITE_OTHER): Payer: Medicare Other | Admitting: Family Medicine

## 2016-08-14 VITALS — BP 126/80 | HR 61 | Temp 97.9°F | Ht 62.25 in | Wt 176.1 lb

## 2016-08-14 DIAGNOSIS — H9193 Unspecified hearing loss, bilateral: Secondary | ICD-10-CM

## 2016-08-14 DIAGNOSIS — Z0000 Encounter for general adult medical examination without abnormal findings: Secondary | ICD-10-CM

## 2016-08-14 DIAGNOSIS — G40209 Localization-related (focal) (partial) symptomatic epilepsy and epileptic syndromes with complex partial seizures, not intractable, without status epilepticus: Secondary | ICD-10-CM | POA: Diagnosis not present

## 2016-08-14 DIAGNOSIS — E785 Hyperlipidemia, unspecified: Secondary | ICD-10-CM

## 2016-08-14 DIAGNOSIS — I495 Sick sinus syndrome: Secondary | ICD-10-CM | POA: Diagnosis not present

## 2016-08-14 DIAGNOSIS — K5900 Constipation, unspecified: Secondary | ICD-10-CM | POA: Diagnosis not present

## 2016-08-14 LAB — HDL CHOLESTEROL: HDL: 69.9 mg/dL (ref >39.00)

## 2016-08-14 LAB — CHOLESTEROL, TOTAL: Cholesterol: 217 mg/dL — ABNORMAL HIGH (ref 0–200)

## 2016-08-14 NOTE — Progress Notes (Signed)
Pre visit review using our clinic review tool, if applicable. No additional management support is needed unless otherwise documented below in the visit note. 

## 2016-08-14 NOTE — Patient Instructions (Signed)
BEFORE YOU LEAVE: -follow up: Medicare Exam with Dr. Maudie Mercury in 1 year -labs -mammogram information -cologard information  Schedule your appointment with the gynecologist. Please call today to set this up.  Please get you mammogram - at the breast center or with your gynecologist.  Call or email [place] is you change your mind and want to do the cologard or another option for colon cancer screening.  Please talk with your mother about advanced directives and let us know if we can help. Please see a lawyer and/or go to this website to help you with advanced directives and designating a health care power of attorney so that your wishes will be followed should you become too ill to make your own medical decisions.  [email or web address]  We have ordered labs or studies at this visit. It can take up to 1-2 weeks for results and processing. IF results require follow up or explanation, we will call you with instructions. Clinically stable results will be released to your [facility]. If you have not heard from [place] or cannot find your results in [facility] in 2 weeks please contact our office at [phone].  If you are not yet signed up for [facility], please consider signing up.   We recommend the following healthy lifestyle for LIFE: 1) Small portions.   Tip: eat off of a salad plate instead of a dinner plate.  Tip: if you need more or a snack choose fruits, veggies and/or a handful of nuts or seeds.  2) Eat a healthy clean diet.  * Tip: Avoid (less then 1 serving per week): processed foods, sweets, sweetened drinks, white starches (rice, flour, bread, potatoes, pasta, etc), red meat, fast foods, butter  *Tip: CHOOSE instead   * 5-9 servings per day of fresh or frozen fruits and vegetables (but not corn, potatoes, bananas, canned or dried fruit)   *nuts and seeds, beans   *olives and olive oil   *small portions of lean meats such as fish and white chicken    *small portions of whole  grains  3)Get at least 150 minutes of sweaty aerobic exercise per week.  4)Reduce stress - consider counseling, meditation and relaxation to balance other aspects of your life.

## 2016-08-28 ENCOUNTER — Ambulatory Visit (INDEPENDENT_AMBULATORY_CARE_PROVIDER_SITE_OTHER): Payer: Medicare Other | Admitting: *Deleted

## 2016-08-28 ENCOUNTER — Telehealth: Payer: Self-pay | Admitting: Cardiology

## 2016-08-28 DIAGNOSIS — I495 Sick sinus syndrome: Secondary | ICD-10-CM | POA: Diagnosis not present

## 2016-08-28 NOTE — Telephone Encounter (Signed)
Confirmed remote transmission w/ pt mother.   

## 2016-08-28 NOTE — Progress Notes (Signed)
Remote pacemaker transmission.   

## 2016-08-29 ENCOUNTER — Encounter: Payer: Self-pay | Admitting: Cardiology

## 2016-08-29 LAB — CUP PACEART REMOTE DEVICE CHECK
Battery Impedance: 272 Ohm
Battery Remaining Longevity: 129 mo
Battery Voltage: 2.78 V
Brady Statistic AP VP Percent: 0 %
Implantable Lead Implant Date: 20120409
Implantable Lead Location: 753860
Implantable Lead Model: 4076
Implantable Pulse Generator Implant Date: 20120409
Lead Channel Impedance Value: 581 Ohm
Lead Channel Impedance Value: 654 Ohm
Lead Channel Setting Pacing Amplitude: 2.5 V
Lead Channel Setting Pacing Pulse Width: 0.4 ms
Lead Channel Setting Sensing Sensitivity: 5.6 mV
MDC IDC LEAD IMPLANT DT: 20120409
MDC IDC LEAD LOCATION: 753859
MDC IDC MSMT LEADCHNL RA PACING THRESHOLD AMPLITUDE: 0.875 V
MDC IDC MSMT LEADCHNL RA PACING THRESHOLD PULSEWIDTH: 0.4 ms
MDC IDC MSMT LEADCHNL RV PACING THRESHOLD AMPLITUDE: 0.5 V
MDC IDC MSMT LEADCHNL RV PACING THRESHOLD PULSEWIDTH: 0.4 ms
MDC IDC SESS DTM: 20180430191550
MDC IDC SET LEADCHNL RA PACING AMPLITUDE: 2 V
MDC IDC STAT BRADY AP VS PERCENT: 0 %
MDC IDC STAT BRADY AS VP PERCENT: 0 %
MDC IDC STAT BRADY AS VS PERCENT: 100 %

## 2016-09-11 ENCOUNTER — Encounter: Payer: Self-pay | Admitting: Internal Medicine

## 2016-09-12 ENCOUNTER — Encounter: Payer: Self-pay | Admitting: Cardiology

## 2016-10-25 ENCOUNTER — Ambulatory Visit (AMBULATORY_SURGERY_CENTER): Payer: Self-pay

## 2016-10-25 ENCOUNTER — Encounter: Payer: Self-pay | Admitting: Internal Medicine

## 2016-10-25 ENCOUNTER — Telehealth: Payer: Self-pay

## 2016-10-25 VITALS — Ht 63.0 in | Wt 178.4 lb

## 2016-10-25 DIAGNOSIS — Z1211 Encounter for screening for malignant neoplasm of colon: Secondary | ICD-10-CM

## 2016-10-25 MED ORDER — POLYETHYLENE GLYCOL 3350 17 GM/SCOOP PO POWD: 1.0000 | Freq: Every day | ORAL | 3 refills | Status: DC

## 2016-10-25 NOTE — Progress Notes (Signed)
Denies allergies to eggs or soy products. Denies complication of anesthesia or sedation. Denies use of weight loss medication. Denies use of O2.   Emmi instructions declined. No home computer. 

## 2016-10-25 NOTE — Telephone Encounter (Signed)
Dr. Carlean Purl,  I just saw [name] [name] in [facility]. The patient has a history of epilepsy. The patients mother states that she doesn't really have a seizure, however she has what is called drop attacks. Evidently the patient blacks out and falls. According to her mother, it happens a couple times a month. The mother thinks that it happens under stress. When questioned, she said like if she is anxious, cold, or hot, hungry. I told the patient that we would proceed with pre-visit, however I would let you know and see if it was ok to proceed in the [place]. Please advise. Thanks.   Riki Sheer, LPN

## 2016-10-25 NOTE — Telephone Encounter (Signed)
I think she is ok for [place] Has had NL EEG in 2016 and same sxs  Will cc Osvaldo Angst for his opinion

## 2016-10-26 NOTE — Telephone Encounter (Signed)
Izora Gala and Dr. Carlean Purl,  This pt is cleared for anesthetic care at [facility].  Thanks,  Osvaldo Angst

## 2016-11-13 ENCOUNTER — Ambulatory Visit (AMBULATORY_SURGERY_CENTER): Payer: Medicare Other | Admitting: Internal Medicine

## 2016-11-13 ENCOUNTER — Encounter: Payer: Self-pay | Admitting: Internal Medicine

## 2016-11-13 VITALS — BP 146/85 | HR 60 | Temp 97.8°F | Resp 25 | Ht 63.0 in | Wt 178.0 lb

## 2016-11-13 DIAGNOSIS — Z1212 Encounter for screening for malignant neoplasm of rectum: Secondary | ICD-10-CM

## 2016-11-13 DIAGNOSIS — D120 Benign neoplasm of cecum: Secondary | ICD-10-CM

## 2016-11-13 DIAGNOSIS — Z1211 Encounter for screening for malignant neoplasm of colon: Secondary | ICD-10-CM

## 2016-11-13 HISTORY — PX: COLONOSCOPY: SHX174

## 2016-11-13 MED ORDER — SODIUM CHLORIDE 0.9 % IV SOLN: 500.0000 mL | INTRAVENOUS | Status: AC

## 2016-11-13 NOTE — Patient Instructions (Addendum)
I found and removed 2 tiny polyps that look benign (not cancer)  I will let you know pathology results and when to have another routine colonoscopy by mail and/or My Chart.  I appreciate the opportunity to care for you. Gatha Mayer, MD, FACG YOU HAD AN ENDOSCOPIC PROCEDURE TODAY AT [facility]:   Refer to the procedure report that was given to you for any specific questions about what was found during the examination.  If the procedure report does not answer your questions, please call your gastroenterologist to clarify.  If you requested that your care partner not be given the details of your procedure findings, then the procedure report has been included in a sealed envelope for you to review at your convenience later.  YOU SHOULD EXPECT: Some feelings of bloating in the abdomen. Passage of more gas than usual.  Walking can help get rid of the air that was put into your GI tract during the procedure and reduce the bloating. If you had a lower endoscopy (such as a colonoscopy or flexible sigmoidoscopy) you may notice spotting of blood in your stool or on the toilet paper. If you underwent a bowel prep for your procedure, you may not have a normal bowel movement for a few days.  Please Note:  You might notice some irritation and congestion in your nose or some drainage.  This is from the oxygen used during your procedure.  There is no need for concern and it should clear up in a day or so.  SYMPTOMS TO REPORT IMMEDIATELY:   Following lower endoscopy (colonoscopy or flexible sigmoidoscopy):  Excessive amounts of blood in the stool  Significant tenderness or worsening of abdominal pains  Swelling of the abdomen that is new, acute  Fever of 100F or higher  For urgent or emergent issues, a gastroenterologist can be reached at any hour by calling [phone].   DIET:  We do recommend a small meal at first, but then you may proceed to your regular diet.  Drink  plenty of fluids but you should avoid alcoholic beverages for 24 hours.  MEDICATIONS: Continue present medications.  Please see handouts given to you by your recovery nurse.  ACTIVITY:  You should plan to take it easy for the rest of today and you should NOT DRIVE or use heavy machinery until tomorrow (because of the sedation medicines used during the test).    FOLLOW UP: Our staff will call the number listed on your records the next business day following your procedure to check on you and address any questions or concerns that you may have regarding the information given to you following your procedure. If we do not reach you, we will leave a message.  However, if you are feeling well and you are not experiencing any problems, there is no need to return our call.  We will assume that you have returned to your regular daily activities without incident.  If any biopsies were taken you will be contacted by phone or by letter within the next 1-3 weeks.  Please call us at [phone] if you have not heard about the biopsies in 3 weeks.   Thank you for allowing [place] to provide for your healthcare needs today.  SIGNATURES/CONFIDENTIALITY: You and/or your care partner have signed paperwork which will be entered into your electronic medical record.  These signatures attest to the fact that that the information above on your After Visit Summary has been reviewed and  is understood.  Full responsibility of the confidentiality of this discharge information lies with you and/or your care-partner.

## 2016-11-13 NOTE — Progress Notes (Signed)
A and O x3. Report to RN. Tolerated MAC anesthesia well.

## 2016-11-13 NOTE — Op Note (Signed)
[facility] Patient Name: [name] [name] Procedure Date: 11/13/2016 11:23 AM MRN: [id number] Endoscopist: Gatha Mayer , MD Age: 61 Referring MD:  Date of Birth: Jul 23, 1955 Gender: Female Account #: [id number] Procedure:                Colonoscopy Indications:              Screening for colorectal malignant neoplasm, This                            is the patient's first colonoscopy Medicines:                Propofol per Anesthesia, Monitored Anesthesia Care Procedure:                Pre-Anesthesia Assessment:                           - Prior to the procedure, a History and Physical                            was performed, and patient medications and                            allergies were reviewed. The patient's tolerance of                            previous anesthesia was also reviewed. The risks                            and benefits of the procedure and the sedation                            options and risks were discussed with the patient.                            All questions were answered, and informed consent                            was obtained. Prior Anticoagulants: The patient has                            taken no previous anticoagulant or antiplatelet                            agents. ASA Grade Assessment: II - A patient with                            mild systemic disease. After reviewing the risks                            and benefits, the patient was deemed in                            satisfactory condition to undergo the procedure.  After obtaining informed consent, the colonoscope                            was passed under direct vision. Throughout the                            procedure, the patient's blood pressure, pulse, and                            oxygen saturations were monitored continuously. The                            Model CF-HQ190L [id number]) scope was introduced   through the anus and advanced to the the cecum,                            identified by appendiceal orifice and ileocecal                            valve. The colonoscopy was performed without                            difficulty. The patient tolerated the procedure                            well. The quality of the bowel preparation was                            excellent. The ileocecal valve, appendiceal                            orifice, and rectum were photographed. The bowel                            preparation used was Miralax. Scope In: 11:29:38 AM Scope Out: 11:44:10 AM Scope Withdrawal Time: 0 hours 10 minutes 33 seconds  Total Procedure Duration: 0 hours 14 minutes 32 seconds  Findings:                 The perianal and digital rectal examinations were                            normal.                           Two sessile polyps were found in the cecum. The                            polyps were diminutive in size. These polyps were                            removed with a cold snare. Resection and retrieval                            were complete. Verification of patient  identification for the specimen was done. Estimated                            blood loss was minimal.                           Scattered small-mouthed diverticula were found in                            the sigmoid colon.                           The exam was otherwise without abnormality on                            direct and retroflexion views. Complications:            No immediate complications. Estimated Blood Loss:     Estimated blood loss was minimal. Impression:               - Two diminutive polyps in the cecum, removed with                            a cold snare. Resected and retrieved.                           - Diverticulosis in the sigmoid colon.                           - The examination was otherwise normal on direct                            and  retroflexion views. Recommendation:           - Patient has a contact number available for                            emergencies. The signs and symptoms of potential                            delayed complications were discussed with the                            patient. Return to normal activities tomorrow.                            Written discharge instructions were provided to the                            patient.                           - Resume previous diet.                           - Continue present medications.                           -  Repeat colonoscopy is recommended. The                            colonoscopy date will be determined after pathology                            results from today's exam become available for                            review. Gatha Mayer, MD 11/13/2016 11:49:05 AM This report has been signed electronically.

## 2016-11-13 NOTE — Progress Notes (Signed)
Called to room to assist during endoscopic procedure.  Patient ID and intended procedure confirmed with present staff. Received instructions for my participation in the procedure from the performing physician.  

## 2016-11-13 NOTE — Progress Notes (Signed)
Kristen Moore PA-S present for procedure 

## 2016-11-14 ENCOUNTER — Telehealth: Payer: Self-pay | Admitting: *Deleted

## 2016-11-14 NOTE — Telephone Encounter (Signed)
  Follow up Call-  Call back number 11/13/2016  Post procedure Call Back phone  # [phone]  Permission to leave phone message Yes  comments pt.  is deaf, mother [name] [name], will answer  Some recent data might be hidden     Patient questions:  Do you have a fever, pain , or abdominal swelling? No. Pain Score  0 *  Have you tolerated food without any problems? Yes.    Have you been able to return to your normal activities? Yes.    Do you have any questions about your discharge instructions: Diet   No. Medications  No. Follow up visit  No.  Do you have questions or concerns about your Care? No.  Actions: * If pain score is 4 or above: No action needed, pain <4.

## 2016-11-19 ENCOUNTER — Encounter: Payer: Self-pay | Admitting: Internal Medicine

## 2016-11-19 DIAGNOSIS — Z860101 Personal history of adenomatous and serrated colon polyps: Secondary | ICD-10-CM | POA: Insufficient documentation

## 2016-11-19 DIAGNOSIS — Z86010 Personal history of colonic polyps: Secondary | ICD-10-CM

## 2016-11-19 HISTORY — DX: Personal history of colonic polyps: Z86.010

## 2016-11-19 HISTORY — DX: Personal history of adenomatous and serrated colon polyps: Z86.0101

## 2016-11-19 NOTE — Progress Notes (Signed)
2 diminutive adenomas Recall colon 2023 My Chart letter

## 2016-11-27 ENCOUNTER — Telehealth: Payer: Self-pay | Admitting: Cardiology

## 2016-11-27 ENCOUNTER — Ambulatory Visit (INDEPENDENT_AMBULATORY_CARE_PROVIDER_SITE_OTHER): Payer: Medicare Other | Admitting: *Deleted

## 2016-11-27 DIAGNOSIS — I495 Sick sinus syndrome: Secondary | ICD-10-CM | POA: Diagnosis not present

## 2016-11-27 NOTE — Progress Notes (Signed)
Remote pacemaker transmission.   

## 2016-11-27 NOTE — Telephone Encounter (Signed)
LMOVM reminding pt to send remote transmission.   

## 2016-11-30 LAB — CUP PACEART REMOTE DEVICE CHECK
Battery Impedance: 273 Ohm
Battery Remaining Longevity: 129 mo
Brady Statistic AS VS Percent: 100 %
Implantable Lead Implant Date: 20120409
Implantable Lead Location: 753859
Implantable Lead Location: 753860
Implantable Lead Model: 4076
Implantable Lead Model: 4076
Implantable Pulse Generator Implant Date: 20120409
Lead Channel Pacing Threshold Amplitude: 0.75 V
Lead Channel Pacing Threshold Pulse Width: 0.4 ms
Lead Channel Sensing Intrinsic Amplitude: 16 mV
Lead Channel Sensing Intrinsic Amplitude: 2.8 mV
Lead Channel Setting Pacing Amplitude: 2 V
Lead Channel Setting Pacing Amplitude: 2.5 V
Lead Channel Setting Pacing Pulse Width: 0.4 ms
MDC IDC LEAD IMPLANT DT: 20120409
MDC IDC MSMT BATTERY VOLTAGE: 2.78 V
MDC IDC MSMT LEADCHNL RA IMPEDANCE VALUE: 608 Ohm
MDC IDC MSMT LEADCHNL RA PACING THRESHOLD PULSEWIDTH: 0.4 ms
MDC IDC MSMT LEADCHNL RV IMPEDANCE VALUE: 684 Ohm
MDC IDC MSMT LEADCHNL RV PACING THRESHOLD AMPLITUDE: 0.5 V
MDC IDC SESS DTM: 20180730154647
MDC IDC SET LEADCHNL RV SENSING SENSITIVITY: 5.6 mV
MDC IDC STAT BRADY AP VP PERCENT: 0 %
MDC IDC STAT BRADY AP VS PERCENT: 0 %
MDC IDC STAT BRADY AS VP PERCENT: 0 %

## 2016-12-01 ENCOUNTER — Encounter: Payer: Self-pay | Admitting: Cardiology

## 2016-12-25 ENCOUNTER — Encounter: Payer: Self-pay | Admitting: Cardiology

## 2017-01-08 ENCOUNTER — Encounter: Payer: Self-pay | Admitting: Neurology

## 2017-01-08 ENCOUNTER — Ambulatory Visit (INDEPENDENT_AMBULATORY_CARE_PROVIDER_SITE_OTHER): Payer: Medicare Other | Admitting: Neurology

## 2017-01-08 VITALS — BP 166/80 | HR 54 | Ht 63.0 in | Wt 176.7 lb

## 2017-01-08 DIAGNOSIS — R030 Elevated blood-pressure reading, without diagnosis of hypertension: Secondary | ICD-10-CM | POA: Diagnosis not present

## 2017-01-08 DIAGNOSIS — R55 Syncope and collapse: Secondary | ICD-10-CM

## 2017-01-08 DIAGNOSIS — G40009 Localization-related (focal) (partial) idiopathic epilepsy and epileptic syndromes with seizures of localized onset, not intractable, without status epilepticus: Secondary | ICD-10-CM

## 2017-01-08 MED ORDER — OXCARBAZEPINE 300 MG PO TABS: 600.0000 mg | ORAL_TABLET | Freq: Two times a day (BID) | ORAL | 0 refills | Status: DC

## 2017-01-08 NOTE — Progress Notes (Signed)
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  She is accompanied by an interpreter and her mother, who provides some history.      UPDATE: Current medications:  Lamotrigine ER 300mg  twice daily.  Trileptal 300mg  in AM and 600mg  in PM.     CMP from 07/03/16:  Na 131 (stable compared to prior readings), K 5, Cl 95, CO2 26, glucose 92, BUN 8, Cr 0.70, total bili 0.4, ALP 83, AST 21 and ALT 19.  She had two spells since last visit.  In March, she was talking with her brother when suddenly she felt diaphoretic and passed out for a couple of minutes.  In June, she was walking her dog and felt anxiety.  Then next thing, she woke up on the ground.  She reports increased depression when these two events occurred.     HISTORY:     Two types: 1) Staring spells, not fully respond, sometimes aware, tenses up but no convulsions, lasts seconds to a minute.  Last episode was in September 2014.   2) Drop attacks, loses conscious and falls.  Sometimes able to hold on to something.  She has had recurrence of drop attacks as well as dizzy spells for the past year.  It seems to have started following the death of her father last year.  The spells were well controlled up until that time.  She does endorse depression.  She has one spell a month, usually at the end of the month.  She had one at the end of December, in which she fell and hit her head.  She had a dizzy spell at the end of January.   Due to recurrence of drop attacks, she had another EEG performed on 06/17/14, which was normal.  Due to dizziness, trough levels of her antiepileptic medications were checked to look for toxicity, which were normal.  Levels were  normal.     Prior medications:  She had side effects to Trileptal at 900mg .  Other medications tried, include Dilantin (ineffective), and immediate-release lamotrigine (ineffective).   Prior studies: EEG: focal slowing and frequent spike and wave activity in left anterior temporal lobe. MRI Brain (04/13/97): focal area of encephalomalacia in posterior left temporal lobe. MRI Brain (10/19/08): possible left mesial temporal sclerosis and lesion in left posterior temporal lobe.   She does not drive.   When she was [age] years old, she developed a virus following a case of the chicken pox. 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  PAST MEDICAL HISTORY: Past Medical History:  Diagnosis Date  . Chicken pox   . Conductive hearing loss, childhood onset   . 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 Epilepsy, followed by Dr. Tomma Rakers in neurology 08/26/2012  . Hx of adenomatous colonic polyps 11/19/2016  . Pacemaker   . Pacemaker    2012  . Seizure ([name])   . Seizures ([place])    Petite Mal     MEDICATIONS: Current Outpatient Prescriptions on File Prior to Visit  Medication Sig Dispense Refill  . Ascorbic Acid (VITAMIN C PO) Take 1 tablet by mouth daily.     Marland Kitchen  Cholecalciferol (VITAMIN D) 2000 UNITS CAPS Take by mouth 2 (two) times daily.    . fish oil-omega-3 fatty acids 1000 MG capsule Take 2 g by mouth 2 (two) times daily.    . LamoTRIgine 300 MG TB24 Take 1 tablet (300 mg total) by mouth 2 (two) times daily. 180 tablet 2  . magnesium 30 MG tablet Take 30 mg by mouth daily.     . Multiple Vitamin (MULTIVITAMIN WITH MINERALS) TABS tablet Take 1 tablet by mouth daily.     Current Facility-Administered Medications on File Prior to Visit  Medication Dose Route Frequency Provider Last Rate Last Dose  . 0.9 %  sodium chloride infusion  500 mL Intravenous Continuous Gatha Mayer, MD        ALLERGIES: No Known Allergies  FAMILY  HISTORY: Family History  Problem Relation Age of Onset  . Diabetes Father   . Hypertension Father   . Colon cancer Father   . Hypertension Mother   . Esophageal cancer Neg Hx   . Rectal cancer Neg Hx   . Stomach cancer Neg Hx     SOCIAL HISTORY: Social History   Social History  . Marital status: Divorced    Spouse name: N/A  . Number of children: N/A  . Years of education: N/A   Occupational History  . Not on file.   Social History Main Topics  . Smoking status: Never Smoker  . Smokeless tobacco: Never Used  . Alcohol use No     Comment: none  . Drug use: No  . Sexual activity: No   Other Topics Concern  . Not on file   Social History Narrative   ** Merged History Encounter **       Work or School: on disability for the deafness, seizure and carpal tunnel  Home Situation: lives with mother and father currently - since 06/2012  Spiritual Beliefs: Christian  Lifestyle: walks for 15-20 minutes a few times per week, well balanced d   iet       REVIEW OF SYSTEMS: Constitutional: No fevers, chills, or sweats, no generalized fatigue, change in appetite Eyes: No visual changes, double vision, eye pain Ear, nose and throat: No hearing loss, ear pain, nasal congestion, sore throat Cardiovascular: No chest pain, palpitations Respiratory:  No shortness of breath at rest or with exertion, wheezes GastrointestinaI: No nausea, vomiting, diarrhea, abdominal pain, fecal incontinence Genitourinary:  No dysuria, urinary retention or frequency Musculoskeletal:  No neck pain, back pain Integumentary: No rash, pruritus, skin lesions Neurological: as above Psychiatric: No depression, insomnia, anxiety Endocrine: No palpitations, fatigue, diaphoresis, mood swings, change in appetite, change in weight, increased thirst Hematologic/Lymphatic:  No purpura, petechiae. Allergic/Immunologic: no itchy/runny eyes, nasal congestion, recent allergic reactions, rashes  PHYSICAL  EXAM: Vitals:   01/08/17 1058  BP: (!) 166/80  Pulse: (!) 54  SpO2: 99%   General: No acute distress.  Patient appears well-groomed.   Head:  Normocephalic/atraumatic Eyes:  Fundi examined but not visualized Neck: supple, no paraspinal tenderness, full range of motion Heart:  Regular rate and rhythm Lungs:  Clear to auscultation bilaterally Back: No paraspinal tenderness Neurological Exam: alert and oriented to person, place, and time. Attention span and concentration intact, recent and remote memory intact, fund of knowledge intact.  Speech fluent and not dysarthric, language intact.  Deaf, otherwise CN II-XII intact. Bulk and tone normal, muscle strength 5/5 throughout.  Sensation to light touch intact.  Deep tendon reflexes 2+ throughout.  Finger to  nose  testing intact.  Gait normal.  IMPRESSION: Localization-related epilepsy with complex partial seizures, stable Drop attacks.  Unclear etiology but may be seizure. Elevated blood pressure  PLAN: 1.  We will increase oxcarbazepine to 600mg  twice daily.   2.  Continue lamotrigine 300mg  twice daily 3.  Check BMP (Na level) today and again in 4 months. 4.  Blood pressure should be rechecked with PCP.   5.  Follow up in 4 months.  18 minutes spent face to face with patient, over 50% spent discussing symptoms and management.  Metta Clines, DO  CC:  Colin Benton, DO

## 2017-01-08 NOTE — Patient Instructions (Signed)
1.  Increase oxcarbazepine 300mg  tablet to 2 tablets twice daily.  2.  Continue lamotrigine 300mg , 1 tablet twice daily 3.  Check basic metabolic panel 4.  Follow up in 4 months.

## 2017-01-08 NOTE — Progress Notes (Signed)
[name],  Thank you for letting us know! We will reach out to her.  [name],  Can you give her a call and let her know her BP was up at [name] office. Schedule appt to recheck. Thanks!

## 2017-01-09 ENCOUNTER — Telehealth: Payer: Self-pay | Admitting: *Deleted

## 2017-01-09 NOTE — Telephone Encounter (Signed)
-----   Message from Lucretia Kern, DO sent at 01/08/2017 12:35 PM EDT -----   ----- Message ----- From: Pieter Partridge, DO Sent: 01/08/2017  11:58 AM To: Lucretia Kern, DO  Hi Jarrett Soho.  Just an FYI that Danaja's blood pressure is elevated again today

## 2017-01-09 NOTE — Telephone Encounter (Signed)
I called the pts mother and informed her of the message below.  She stated she will talk with the pt and call back for an appt.

## 2017-01-14 NOTE — Progress Notes (Signed)
HPI: [name] [name] is a pleasant 61 y.o. here for follow up. Chronic medical problems summarized below were reviewed for changes. She is here to check on her blood pressure which was elevated and a recent appointment with her neurologist. Denies CP, SOB, DOE, treatment intolerance or new symptoms. AWV 08/14/16 Interpreter present  Elevated BP: -at recent appointment with her neurologist -no chest pain, shortness of breath or headache -Diet is so-so, she does drink a bit of Dr. Malachi Bonds, she gets some exercise but not on a regular basis -She would prefer to make lifestyle changes rather than using a medication if it remains elevated  Deaf: -interpreter present  Obesity/Hyperlipidemia: -ratio good -prefers to treat with lifestyle changes  Recurrent Syncope: -hx pacemaker due to asystole on tilt table test -sees cardiologist  Epilepsy: -sees neurologist -meds: lamotrigine, oxcarbazapine  Hx abnormal pap: -sees gyn  ROS: See pertinent positives and negatives per HPI.  Past Medical History:  Diagnosis Date  . Chicken pox   . Conductive hearing loss, childhood onset   . 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 Epilepsy, followed by Dr. Tomma Rakers in neurology 08/26/2012  . Hx of adenomatous colonic polyps 11/19/2016  . Pacemaker   . Pacemaker    2012  . Seizure ([place])   . Seizures ([place])    Petite Mal     Past Surgical History:  Procedure Laterality Date  . HIP ARTHROPLASTY    . PACEMAKER INSERTION  07/2010  . TOTAL HIP ARTHROPLASTY  07/2011   right  . TOTAL HIP ARTHROPLASTY  03/2012   left    Family History  Problem Relation Age of Onset  . Diabetes Father   . Hypertension Father   . Colon cancer Father   . Hypertension Mother   . Esophageal cancer Neg Hx   . Rectal cancer Neg Hx   . Stomach cancer Neg Hx     Social History   Social History  . Marital status: Divorced    Spouse name: N/A  . Number of children: N/A  . Years of education: N/A    Social History Main Topics  . Smoking status: Never Smoker  . Smokeless tobacco: Never Used  . Alcohol use No     Comment: none  . Drug use: No  . Sexual activity: No   Other Topics Concern  . None   Social History Narrative   ** Merged History Encounter **       Work or School: on disability for the deafness, seizure and carpal tunnel  Home Situation: lives with mother and father currently - since 06/2012  Spiritual Beliefs: Christian  Lifestyle: walks for 15-20 minutes a few times per week, well balanced d   iet        Current Outpatient Prescriptions:  .  Ascorbic Acid (VITAMIN C PO), Take 1 tablet by mouth daily. , Disp: , Rfl:  .  Cholecalciferol (VITAMIN D) 2000 UNITS CAPS, Take by mouth 2 (two) times daily., Disp: , Rfl:  .  fish oil-omega-3 fatty acids 1000 MG capsule, Take 2 g by mouth 2 (two) times daily., Disp: , Rfl:  .  LamoTRIgine 300 MG TB24, Take 1 tablet (300 mg total) by mouth 2 (two) times daily., Disp: 180 tablet, Rfl: 2 .  magnesium 30 MG tablet, Take 30 mg by mouth daily. , Disp: , Rfl:  .  Multiple Vitamin (MULTIVITAMIN WITH MINERALS) TABS tablet, Take 1 tablet by mouth daily., Disp: ,  Rfl:  .  Oxcarbazepine (TRILEPTAL) 300 MG tablet, Take 2 tablets (600 mg total) by mouth 2 (two) times daily. TAKE ONE TABLET BY MOUTH IN THE MORNING AND TWO IN THE EVENING, Disp: 120 tablet, Rfl: 0  Current Facility-Administered Medications:  .  0.9 %  sodium chloride infusion, 500 mL, Intravenous, Continuous, Gatha Mayer, MD  EXAM:  Vitals:   01/15/17 1009  BP: 122/82  Pulse: 76  Temp: 97.8 F (36.6 C)    Body mass index is 31.96 kg/m.  GENERAL: vitals reviewed and listed above, alert, oriented, appears well hydrated and in no acute distress  HEENT: atraumatic, conjunttiva clear, no obvious abnormalities on inspection of external nose and ears  NECK: no obvious masses on inspection  LUNGS: clear to auscultation bilaterally, no wheezes, rales or  rhonchi, good air movement  CV: HRRR, no peripheral edema  MS: moves all extremities without noticeable abnormality  PSYCH: pleasant and cooperative, no obvious depression or anxiety  ASSESSMENT AND PLAN:  Discussed the following assessment and plan:  Elevated blood-pressure reading without diagnosis of hypertension  Bilateral deafness  Sinus node dysfunction (HCC)  Pacemaker-dual chamber medtronic  Hyperlipidemia, unspecified hyperlipidemia type  BMI 31.0-31.9,adult  Localization-related idiopathic epilepsy and epileptic syndromes with seizures of localized onset, not intractable, without status epilepticus ([place])  Need for immunization against influenza - Plan: Flu Vaccine QUAD 6+ mos PF IM (Fluarix Quad PF)  -systolic blood pressure is a little high on arrival at 142, blood pressure is improved significantly on recheck with only a mildly elevated diastolic blood pressure. We discussed treatment options and she would prefer to try lifestyle changes first. Lifestyle changes discussed at length. -plan to recheck in 3 months and consider medication if needed -plans to get flu shot today -Advised she is due for her mammogram and her Pap smear, she agrees to have her mother schedule these -Patient advised to return or notify a doctor immediately if symptoms worsen or persist or new concerns arise.  Patient Instructions  BEFORE YOU LEAVE: -flu shot -follow up: 3 months  Please call in the next 3-4 days to set up your mammogram and your pap smear.  Advise regular aerobic exercise (at least 150 minutes per week of sweaty exercise) and a healthy diet. Try to eat at least 5-9 servings of vegetables and fruits per day (not corn, potatoes or bananas.) Eat a low sodium (low salt) diet. Please drink water instead of other beverages. Avoid sweets, sodas, red meat, pork, butter, fried foods, fast food, processed food, excessive dairy, eggs and coconut. Replace bad fats with good fats -  fish, nuts and seeds, canola oil, olive oil.     Colin Benton R., DO

## 2017-01-15 ENCOUNTER — Telehealth: Payer: Self-pay | Admitting: Neurology

## 2017-01-15 ENCOUNTER — Encounter: Payer: Self-pay | Admitting: Family Medicine

## 2017-01-15 ENCOUNTER — Ambulatory Visit (INDEPENDENT_AMBULATORY_CARE_PROVIDER_SITE_OTHER): Payer: Medicare Other | Admitting: Family Medicine

## 2017-01-15 VITALS — BP 122/82 | HR 76 | Temp 97.8°F | Ht 63.0 in | Wt 180.4 lb

## 2017-01-15 DIAGNOSIS — G40009 Localization-related (focal) (partial) idiopathic epilepsy and epileptic syndromes with seizures of localized onset, not intractable, without status epilepticus: Secondary | ICD-10-CM

## 2017-01-15 DIAGNOSIS — Z23 Encounter for immunization: Secondary | ICD-10-CM | POA: Diagnosis not present

## 2017-01-15 DIAGNOSIS — Z950 Presence of cardiac pacemaker: Secondary | ICD-10-CM

## 2017-01-15 DIAGNOSIS — R030 Elevated blood-pressure reading, without diagnosis of hypertension: Secondary | ICD-10-CM | POA: Diagnosis not present

## 2017-01-15 DIAGNOSIS — I495 Sick sinus syndrome: Secondary | ICD-10-CM | POA: Diagnosis not present

## 2017-01-15 DIAGNOSIS — E785 Hyperlipidemia, unspecified: Secondary | ICD-10-CM | POA: Diagnosis not present

## 2017-01-15 DIAGNOSIS — H9193 Unspecified hearing loss, bilateral: Secondary | ICD-10-CM

## 2017-01-15 DIAGNOSIS — Z6831 Body mass index (BMI) 31.0-31.9, adult: Secondary | ICD-10-CM | POA: Diagnosis not present

## 2017-01-15 NOTE — Telephone Encounter (Signed)
Pt called and wants to know if it's ok to take her medication before her blood work on Wednesday, please advise

## 2017-01-15 NOTE — Telephone Encounter (Signed)
Called and spoke with mother**Pt is deaf** Pt did not call-- advsd labs are not fasting, is ok to eat and take meds as normal

## 2017-01-15 NOTE — Patient Instructions (Signed)
BEFORE YOU LEAVE: -flu shot -follow up: 3 months  Please call in the next 3-4 days to set up your mammogram and your pap smear.  Advise regular aerobic exercise (at least 150 minutes per week of sweaty exercise) and a healthy diet. Try to eat at least 5-9 servings of vegetables and fruits per day (not corn, potatoes or bananas.) Eat a low sodium (low salt) diet. Please drink water instead of other beverages. Avoid sweets, sodas, red meat, pork, butter, fried foods, fast food, processed food, excessive dairy, eggs and coconut. Replace bad fats with good fats - fish, nuts and seeds, canola oil, olive oil.

## 2017-01-17 ENCOUNTER — Telehealth: Payer: Self-pay | Admitting: *Deleted

## 2017-01-17 ENCOUNTER — Other Ambulatory Visit: Payer: Medicare Other

## 2017-01-17 DIAGNOSIS — R55 Syncope and collapse: Secondary | ICD-10-CM

## 2017-01-17 DIAGNOSIS — G40009 Localization-related (focal) (partial) idiopathic epilepsy and epileptic syndromes with seizures of localized onset, not intractable, without status epilepticus: Secondary | ICD-10-CM

## 2017-01-17 LAB — BASIC METABOLIC PANEL
BUN: 9 mg/dL (ref 7–25)
CALCIUM: 9.7 mg/dL (ref 8.6–10.4)
CO2: 26 mmol/L (ref 20–32)
Chloride: 96 mmol/L — ABNORMAL LOW (ref 98–110)
Creat: 0.73 mg/dL (ref 0.50–0.99)
Glucose, Bld: 87 mg/dL (ref 65–99)
Potassium: 4.8 mmol/L (ref 3.5–5.3)
SODIUM: 132 mmol/L — AB (ref 135–146)

## 2017-01-17 NOTE — Telephone Encounter (Signed)
Decrease oxcarbazepine back to 300mg  twice daily and she should contact us next week with update.

## 2017-01-17 NOTE — Telephone Encounter (Signed)
Patient came in for lab work and also stopped by to let us know that she has had some blurred vision, numbness on left side of face and some swelling under her left eye since increasing her oxcarbazepine.  Please advise.

## 2017-01-18 ENCOUNTER — Encounter: Payer: Self-pay | Admitting: Family Medicine

## 2017-01-18 NOTE — Telephone Encounter (Signed)
Spoke with Pts mother in the waiting room, Pt was there, Pts mother relayed information to decrease oxcarbzepine to 300mg  twice daily. They both understood directions.

## 2017-02-26 ENCOUNTER — Ambulatory Visit (INDEPENDENT_AMBULATORY_CARE_PROVIDER_SITE_OTHER): Payer: Medicare Other | Admitting: *Deleted

## 2017-02-26 DIAGNOSIS — I495 Sick sinus syndrome: Secondary | ICD-10-CM | POA: Diagnosis not present

## 2017-02-26 NOTE — Progress Notes (Signed)
Remote pacemaker transmission.   

## 2017-02-27 LAB — CUP PACEART REMOTE DEVICE CHECK
Battery Remaining Longevity: 129 mo
Battery Voltage: 2.78 V
Brady Statistic AP VS Percent: 0 %
Brady Statistic AS VP Percent: 0 %
Implantable Lead Implant Date: 20120409
Implantable Lead Location: 753859
Implantable Lead Location: 753860
Implantable Lead Model: 4076
Implantable Pulse Generator Implant Date: 20120409
Lead Channel Impedance Value: 571 Ohm
Lead Channel Impedance Value: 659 Ohm
Lead Channel Pacing Threshold Amplitude: 0.5 V
Lead Channel Pacing Threshold Pulse Width: 0.4 ms
Lead Channel Setting Pacing Amplitude: 2 V
Lead Channel Setting Pacing Pulse Width: 0.4 ms
Lead Channel Setting Sensing Sensitivity: 5.6 mV
MDC IDC LEAD IMPLANT DT: 20120409
MDC IDC MSMT BATTERY IMPEDANCE: 272 Ohm
MDC IDC MSMT LEADCHNL RA PACING THRESHOLD AMPLITUDE: 0.625 V
MDC IDC MSMT LEADCHNL RA SENSING INTR AMPL: 2.8 mV
MDC IDC MSMT LEADCHNL RV PACING THRESHOLD PULSEWIDTH: 0.4 ms
MDC IDC MSMT LEADCHNL RV SENSING INTR AMPL: 16 mV
MDC IDC SESS DTM: 20181029154919
MDC IDC SET LEADCHNL RV PACING AMPLITUDE: 2.5 V
MDC IDC STAT BRADY AP VP PERCENT: 0 %
MDC IDC STAT BRADY AS VS PERCENT: 100 %

## 2017-03-06 ENCOUNTER — Encounter: Payer: Self-pay | Admitting: Cardiology

## 2017-04-13 NOTE — Progress Notes (Signed)
HPI:   Hx Elevated BP: -She preferred to make lifestyle changes rather than using a medication if it remains elevated Reports she is doing well without any complaints today.  He is walking almost an hour each day and is trying to eat healthy.  She has been eating a lot of fruit, but otherwise it seems she is doing well with her diet.  No chest pain, shortness of breath, trouble breathing or any other complaints today.  She is due for her mammogram and Pap smear, but she reports she will do this with her gynecologist and agrees to schedule.   Deaf: -interpreter present  Obesity/Hyperlipidemia: -ratio good -prefers to treat with lifestyle changes  Recurrent Syncope: -hx pacemaker due to asystole on tilt table test -sees cardiologist  Epilepsy: -sees neurologist -meds: lamotrigine, oxcarbazapine  Hx abnormal pap: -sees gyn   ROS: See pertinent positives and negatives per HPI.  Past Medical History:  Diagnosis Date  . Chicken pox   . Conductive hearing loss, childhood onset   . 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 Epilepsy, followed by Dr. Tomma Rakers in neurology 08/26/2012  . Hx of adenomatous colonic polyps 11/19/2016  . Pacemaker   . Pacemaker    2012  . Seizure ([place])   . Seizures ([place])    Petite Mal     Past Surgical History:  Procedure Laterality Date  . HIP ARTHROPLASTY    . PACEMAKER INSERTION  07/2010  . TOTAL HIP ARTHROPLASTY  07/2011   right  . TOTAL HIP ARTHROPLASTY  03/2012   left    Family History  Problem Relation Age of Onset  . Diabetes Father   . Hypertension Father   . Colon cancer Father   . Hypertension Mother   . Esophageal cancer Neg Hx   . Rectal cancer Neg Hx   . Stomach cancer Neg Hx     Social History   Socioeconomic History  . Marital status: Divorced    Spouse name: None  . Number of children: None  . Years of education: None  . Highest education level: None  Social Needs  . Financial resource strain:  None  . Food insecurity - worry: None  . Food insecurity - inability: None  . Transportation needs - medical: None  . Transportation needs - non-medical: None  Occupational History  . None  Tobacco Use  . Smoking status: Never Smoker  . Smokeless tobacco: Never Used  Substance and Sexual Activity  . Alcohol use: No    Alcohol/week: 0.0 oz    Comment: none  . Drug use: No  . Sexual activity: No  Other Topics Concern  . None  Social History Narrative   ** Merged History Encounter **       Work or School: on disability for the deafness, seizure and carpal tunnel  Home Situation: lives with mother and father currently - since 06/2012  Spiritual Beliefs: Christian  Lifestyle: walks for 15-20 minutes a few times per week, well balanced d   iet        Current Outpatient Medications:  .  Ascorbic Acid (VITAMIN C PO), Take 1 tablet by mouth daily. , Disp: , Rfl:  .  Cholecalciferol (VITAMIN D) 2000 UNITS CAPS, Take by mouth 2 (two) times daily., Disp: , Rfl:  .  fish oil-omega-3 fatty acids 1000 MG capsule, Take 2 g by mouth 2 (two) times daily., Disp: , Rfl:  .  LamoTRIgine 300 MG  TB24, Take 1 tablet (300 mg total) by mouth 2 (two) times daily., Disp: 180 tablet, Rfl: 2 .  magnesium 30 MG tablet, Take 30 mg by mouth daily. , Disp: , Rfl:  .  Multiple Vitamin (MULTIVITAMIN WITH MINERALS) TABS tablet, Take 1 tablet by mouth daily., Disp: , Rfl:  .  Oxcarbazepine (TRILEPTAL) 300 MG tablet, Take 2 tablets (600 mg total) by mouth 2 (two) times daily. TAKE ONE TABLET BY MOUTH IN THE MORNING AND TWO IN THE EVENING, Disp: 120 tablet, Rfl: 0  Current Facility-Administered Medications:  .  0.9 %  sodium chloride infusion, 500 mL, Intravenous, Continuous, Gatha Mayer, MD  EXAM:  Vitals:   04/16/17 1009  BP: 116/72  Pulse: (!) 59  Temp: (!) 97.5 F (36.4 C)    Body mass index is 31.02 kg/m.  GENERAL: vitals reviewed and listed above, alert, oriented, appears well hydrated  and in no acute distress  HEENT: atraumatic, conjunttiva clear, no obvious abnormalities on inspection of external nose and ears  NECK: no obvious masses on inspection  LUNGS: clear to auscultation bilaterally, no wheezes, rales or rhonchi, good air movement  CV: HRRR, no peripheral edema  MS: moves all extremities without noticeable abnormality  PSYCH: pleasant and cooperative, no obvious depression or anxiety  ASSESSMENT AND PLAN:  Discussed the following assessment and plan:  BMI 31.0-31.9,adult -Lifestyle recommendations discussed at length, advised a healthy low sugar diet and regular exercise, congratulated her on changes -Plan to check labs at her next visit  Localization-related idiopathic epilepsy and epileptic syndromes with seizures of localized onset, not intractable, without status epilepticus ([place]) -Specialist for management, no complaints today  Sinus node dysfunction ([place]) -Specialist, no complaints  Bilateral deafness -Interpreter present  Blood pressure elevated without history of HTN -Pretty good today, will continue with lifestyle treatment  -Patient advised to return or notify a doctor immediately if symptoms worsen or persist or new concerns arise.  Patient Instructions  BEFORE YOU LEAVE: - no labs needed today -follow up:  1) MEDICARE exam with susan on same day in April (after the 16th) AND 2) CPE with Dr. Maudie Mercury the same day  Please call today to schedule your Pap smear mammogram with your gynecologist.  Will plan to do fasting labs at physical in April - can have water or black coffee   We recommend the following healthy lifestyle for LIFE: 1) Small portions. But, make sure to get regular (at least 3 per day), healthy meals and small healthy snacks if needed.  2) Eat a healthy clean diet.   TRY TO EAT: -at least 5-7 servings of low sugar, colorful, and nutrient rich vegetables per day (not corn, potatoes or bananas.) -berries are the best  choice if you wish to eat fruit (only eat small amounts if trying to reduce weight)  -lean meets (fish, white meat of chicken or [place]) -vegan proteins for some meals - beans or tofu, whole grains, nuts and seeds -Replace bad fats with good fats - good fats include: fish, nuts and seeds, canola oil, olive oil -small amounts of low fat or non fat dairy -small amounts of100 % whole grains - check the lables -drink plenty of water  AVOID: -SUGAR, sweets, anything with added sugar, corn syrup or sweeteners - must read labels as even foods advertised as "healthy" often are loaded with sugar -if you must have a sweetener, small amounts of stevia may be best -sweetened beverages and artificially sweetened beverages -simple starches (rice, bread,  potatoes, pasta, chips, etc - small amounts of 100% whole grains are ok) -red meat, pork, butter -fried foods, fast food, processed food, excessive dairy, eggs and coconut.  3)Get at least 150 minutes of sweaty aerobic exercise per week.  4)Reduce stress - consider counseling, meditation and relaxation to balance other aspects of your life.     Colin Benton R., DO

## 2017-04-16 ENCOUNTER — Encounter: Payer: Self-pay | Admitting: Family Medicine

## 2017-04-16 ENCOUNTER — Ambulatory Visit (INDEPENDENT_AMBULATORY_CARE_PROVIDER_SITE_OTHER): Payer: Medicare Other | Admitting: Family Medicine

## 2017-04-16 VITALS — BP 116/72 | HR 59 | Temp 97.5°F | Ht 63.0 in | Wt 175.1 lb

## 2017-04-16 DIAGNOSIS — R030 Elevated blood-pressure reading, without diagnosis of hypertension: Secondary | ICD-10-CM

## 2017-04-16 DIAGNOSIS — H9193 Unspecified hearing loss, bilateral: Secondary | ICD-10-CM | POA: Diagnosis not present

## 2017-04-16 DIAGNOSIS — I495 Sick sinus syndrome: Secondary | ICD-10-CM | POA: Diagnosis not present

## 2017-04-16 DIAGNOSIS — Z6831 Body mass index (BMI) 31.0-31.9, adult: Secondary | ICD-10-CM | POA: Diagnosis not present

## 2017-04-16 DIAGNOSIS — G40009 Localization-related (focal) (partial) idiopathic epilepsy and epileptic syndromes with seizures of localized onset, not intractable, without status epilepticus: Secondary | ICD-10-CM | POA: Diagnosis not present

## 2017-04-16 NOTE — Patient Instructions (Addendum)
BEFORE YOU LEAVE: - no labs needed today -follow up:  1) MEDICARE exam with susan on same day in April (after the 16th) AND 2) CPE with Dr. Maudie Mercury the same day  Please call today to schedule your Pap smear mammogram with your gynecologist.  Will plan to do fasting labs at physical in April - can have water or black coffee   We recommend the following healthy lifestyle for LIFE: 1) Small portions. But, make sure to get regular (at least 3 per day), healthy meals and small healthy snacks if needed.  2) Eat a healthy clean diet.   TRY TO EAT: -at least 5-7 servings of low sugar, colorful, and nutrient rich vegetables per day (not corn, potatoes or bananas.) -berries are the best choice if you wish to eat fruit (only eat small amounts if trying to reduce weight)  -lean meets (fish, white meat of chicken or [place]) -vegan proteins for some meals - beans or tofu, whole grains, nuts and seeds -Replace bad fats with good fats - good fats include: fish, nuts and seeds, canola oil, olive oil -small amounts of low fat or non fat dairy -small amounts of100 % whole grains - check the lables -drink plenty of water  AVOID: -SUGAR, sweets, anything with added sugar, corn syrup or sweeteners - must read labels as even foods advertised as "healthy" often are loaded with sugar -if you must have a sweetener, small amounts of stevia may be best -sweetened beverages and artificially sweetened beverages -simple starches (rice, bread, potatoes, pasta, chips, etc - small amounts of 100% whole grains are ok) -red meat, pork, butter -fried foods, fast food, processed food, excessive dairy, eggs and coconut.  3)Get at least 150 minutes of sweaty aerobic exercise per week.  4)Reduce stress - consider counseling, meditation and relaxation to balance other aspects of your life.

## 2017-05-02 ENCOUNTER — Other Ambulatory Visit: Payer: Self-pay | Admitting: Neurology

## 2017-05-08 ENCOUNTER — Telehealth: Payer: Self-pay | Admitting: Neurology

## 2017-05-08 NOTE — Telephone Encounter (Signed)
Patient is running out of her 2 Medications prescribed by Dr. [name]. She needs them refilled. Oxcarbazepine and Lamotrigine. Please Call. Thanks

## 2017-05-09 NOTE — Telephone Encounter (Signed)
[facility] left a message saying they faxed over prescription refills on the 2nd,4th and 8th but has not received a response yet CB# [phone]

## 2017-05-09 NOTE — Telephone Encounter (Signed)
[facility]	[place], spoke with [facility]. Advsd her I had sent in refills on 05/02/17 and the PA for lamotrigine was initiated on cover my meds. Edie stated the oxcarbazepine was ready. She gave me a number to call, Pt will run out of medication tomorrow. Called [facility] [phone], spoke with Magdalene Molly, gave clinicals [phone] valid until 04/30/2018. Called [facility]

## 2017-05-10 ENCOUNTER — Encounter: Payer: Self-pay | Admitting: Family Medicine

## 2017-05-15 ENCOUNTER — Other Ambulatory Visit: Payer: Medicare Other

## 2017-05-15 DIAGNOSIS — G40009 Localization-related (focal) (partial) idiopathic epilepsy and epileptic syndromes with seizures of localized onset, not intractable, without status epilepticus: Secondary | ICD-10-CM

## 2017-05-15 DIAGNOSIS — R55 Syncope and collapse: Secondary | ICD-10-CM

## 2017-05-15 LAB — BASIC METABOLIC PANEL
BUN: 9 mg/dL (ref 7–25)
CHLORIDE: 96 mmol/L — AB (ref 98–110)
CO2: 29 mmol/L (ref 20–32)
CREATININE: 0.77 mg/dL (ref 0.50–0.99)
Calcium: 9.9 mg/dL (ref 8.6–10.4)
Glucose, Bld: 82 mg/dL (ref 65–99)
Potassium: 4.8 mmol/L (ref 3.5–5.3)
Sodium: 132 mmol/L — ABNORMAL LOW (ref 135–146)

## 2017-05-18 ENCOUNTER — Encounter: Payer: Self-pay | Admitting: Neurology

## 2017-05-18 ENCOUNTER — Ambulatory Visit (INDEPENDENT_AMBULATORY_CARE_PROVIDER_SITE_OTHER): Payer: Medicare Other | Admitting: Neurology

## 2017-05-18 VITALS — BP 158/88 | HR 68 | Ht 63.0 in | Wt 172.4 lb

## 2017-05-18 DIAGNOSIS — Z79899 Other long term (current) drug therapy: Secondary | ICD-10-CM | POA: Diagnosis not present

## 2017-05-18 DIAGNOSIS — R030 Elevated blood-pressure reading, without diagnosis of hypertension: Secondary | ICD-10-CM | POA: Diagnosis not present

## 2017-05-18 DIAGNOSIS — G40009 Localization-related (focal) (partial) idiopathic epilepsy and epileptic syndromes with seizures of localized onset, not intractable, without status epilepticus: Secondary | ICD-10-CM

## 2017-05-18 NOTE — Patient Instructions (Addendum)
1.  Continue lamotrigine ER 300mg  twice daily and oxcarbazepine 300mg  in morning and 600mg  at night. 2. Follow up with PCP about elevated blood pressure. 3.  Follow up in 6 months.  Repeat BMP prior to follow up.

## 2017-05-18 NOTE — Progress Notes (Signed)
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  She is accompanied by an interpreter.   UPDATE: Current medications:  Lamotrigine ER 300mg  twice daily.  Trileptal was increased to 600mg  twice daily.  However, she reported blurred vision and facial numbness, so dose was decreased to 300mg  in AM and 600mg  at night.  She has not had any habitual events. BMP from 05/15/17:  Na 132, K 4.8, Cl 96, Co2 29, glucose 82, BUN 9, Cr 0.77.   Once in awhile, she will develop a moderate to severe throbbing headache in bandlike distribution.  It is not associated with other symptoms such as nausea, photophobia, phonophobia, visual disturbance.  It may last a few hours and occurs infrequently.  Usually, it is associated with a panic attack.  2 days ago, she had a "weird" feeling in her head.  She cannot describe it.  It was a pressure but not an ache or painful.  It lasted about 10 minutes.  She never had it before.  She felt anxious.   HISTORY:     Two types: 1) Staring spells, not fully respond, sometimes aware, tenses up but no convulsions, lasts seconds to a minute.    2) Drop attacks, loses conscious and falls.  Sometimes able to hold on to something.  She has had recurrence of drop attacks as well as dizzy spells for the past year.  It seems to have started following the death of her father last year.  The spells were well controlled up until that time.  She does endorse depression.  She has one spell a month, usually at the end of the month.    Due to recurrence of drop attacks, she had another EEG performed on 06/17/14, which was normal.  Due to dizziness, trough levels of her antiepileptic medications were  checked to look for toxicity, which were normal.  Levels were normal.     Prior medications:  She had side effects to Trileptal at 900mg .  Other medications tried, include Dilantin (ineffective), and immediate-release lamotrigine (ineffective).   Prior studies: EEG: focal slowing and frequent spike and wave activity in left anterior temporal lobe. MRI Brain (04/13/97): focal area of encephalomalacia in posterior left temporal lobe. MRI Brain (10/19/08): possible left mesial temporal sclerosis and lesion in left posterior temporal lobe.   She does not drive.   When she was [age] years old, she developed a virus following a case of the chicken pox. 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  PAST MEDICAL HISTORY: Past Medical History:  Diagnosis Date  . Chicken pox   . Conductive hearing loss, childhood onset   . 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 Epilepsy, followed by Dr. Tomma Rakers in neurology 08/26/2012  . Hx of adenomatous colonic polyps 11/19/2016  . Pacemaker   . Pacemaker    2012  . Seizure ([place])   . Seizures ([place])    Petite Mal     MEDICATIONS: Current Outpatient Medications on File Prior to Visit  Medication Sig Dispense Refill  . Ascorbic Acid (VITAMIN C PO) Take 1 tablet by mouth daily.     . Cholecalciferol (  VITAMIN D) 2000 UNITS CAPS Take by mouth 2 (two) times daily.    . fish oil-omega-3 fatty acids 1000 MG capsule Take 2 g by mouth 2 (two) times daily.    . LamoTRIgine 300 MG TB24 24 hour tablet TAKE 1 TABLET BY MOUTH TWICE DAILY 180 tablet 2  . magnesium 30 MG tablet Take 30 mg by mouth daily.     . Multiple Vitamin (MULTIVITAMIN WITH MINERALS) TABS tablet Take 1 tablet by mouth daily.    . Oxcarbazepine (TRILEPTAL) 300 MG tablet Take 2 tablets (600 mg total) by mouth 2 (two) times daily. TAKE ONE TABLET BY MOUTH IN THE MORNING AND TWO IN THE EVENING 120 tablet 0  . Oxcarbazepine (TRILEPTAL) 300 MG tablet TAKE  1 TABLET BY MOUTH ONCE DAILY IN THE MORNING AND 2 IN THE EVENING 270 tablet 2   Current Facility-Administered Medications on File Prior to Visit  Medication Dose Route Frequency Provider Last Rate Last Dose  . 0.9 %  sodium chloride infusion  500 mL Intravenous Continuous Gatha Mayer, MD        ALLERGIES: No Known Allergies  FAMILY HISTORY: Family History  Problem Relation Age of Onset  . Diabetes Father   . Hypertension Father   . Colon cancer Father   . Hypertension Mother   . Esophageal cancer Neg Hx   . Rectal cancer Neg Hx   . Stomach cancer Neg Hx     SOCIAL HISTORY: Social History   Socioeconomic History  . Marital status: Divorced    Spouse name: Not on file  . Number of children: Not on file  . Years of education: Not on file  . Highest education level: Not on file  Social Needs  . Financial resource strain: Not on file  . Food insecurity - worry: Not on file  . Food insecurity - inability: Not on file  . Transportation needs - medical: Not on file  . Transportation needs - non-medical: Not on file  Occupational History  . Not on file  Tobacco Use  . Smoking status: Never Smoker  . Smokeless tobacco: Never Used  Substance and Sexual Activity  . Alcohol use: No    Alcohol/week: 0.0 oz    Comment: none  . Drug use: No  . Sexual activity: No  Other Topics Concern  . Not on file  Social History Narrative   ** Merged History Encounter **       Work or School: on disability for the deafness, seizure and carpal tunnel  Home Situation: lives with mother and father currently - since 06/2012  Spiritual Beliefs: Christian  Lifestyle: walks for 15-20 minutes a few times per week, well balanced d   iet       REVIEW OF SYSTEMS: Constitutional: No fevers, chills, or sweats, no generalized fatigue, change in appetite Eyes: No visual changes, double vision, eye pain Ear, nose and throat: Deaf.  No ear pain, nasal congestion, sore throat Cardiovascular:  No chest pain, palpitations Respiratory:  No shortness of breath at rest or with exertion, wheezes GastrointestinaI: No nausea, vomiting, diarrhea, abdominal pain, fecal incontinence Genitourinary:  No dysuria, urinary retention or frequency Musculoskeletal:  No neck pain, back pain Integumentary: No rash, pruritus, skin lesions Neurological: as above Psychiatric: anxiety Endocrine: No palpitations, fatigue, diaphoresis, mood swings, change in appetite, change in weight, increased thirst Hematologic/Lymphatic:  No purpura, petechiae. Allergic/Immunologic: no itchy/runny eyes, nasal congestion, recent allergic reactions, rashes  PHYSICAL EXAM: Vitals:   05/18/17 1102  BP: (!) 158/88  Pulse: 68  SpO2: 98%   General: No acute distress.  Patient appears well-groomed.   Head:  Normocephalic/atraumatic Eyes:  Fundi examined but not visualized Neck: supple, no paraspinal tenderness, full range of motion Heart:  Regular rate and rhythm Lungs:  Clear to auscultation bilaterally Back: No paraspinal tenderness Neurological Exam: alert and oriented to person, place, and time. Attention span and concentration intact, recent and remote memory intact, fund of knowledge intact.  Speech fluent and not dysarthric, language intact.  Deaf, otherwise CN II-XII intact. Bulk and tone normal, muscle strength 5/5 throughout.  Sensation to light touch intact.  Deep tendon reflexes 2+ throughout.  Finger to nose  testing intact.  Gait normal.  IMPRESSION: Localization-related epilepsy with complex partial seizures, stable Drop attacks.   Tension type headache Panic attacks Elevated blood pressure  PLAN: 1.  Continue lamotrigine ER 300mg  twice daily and oxcarbazepine 300mg  in AM and 600mg  in PM.  Repeat BMP in 6 months. 2.  Follow up with PCP regarding blood pressure 3.  Follow up in 6 months.  20 minutes spent face to face with patient, over 50% spent discussing management.  Metta Clines, DO  CC:    Colin Benton, DO

## 2017-05-26 NOTE — Progress Notes (Signed)
Cardiology Office Note Date:  05/28/2017  Patient ID:  [name], [name] 1955/05/17, MRN [id number] PCP:  Lucretia Kern, DO  Neurology: Dr. [name] Electrophysiologist: Dr. [name]   Chief Complaint: annual EP visit  History of Present Illness: [name] [name] is a 62 y.o. female with history of syncope/asystole s/p PPM, drop attacks, epilepsy, follows with neurology, deafness.    She comes today accompanied by a sign language interpretor, [name] [name] w/[facility]).  The patient reports doing very well, no CP, palpitations or SOB, no dizzy spells, no near syncope or syncope.  She sees her PMD routinely and reports routine labs done there.  Device imformtion MDT dual chamber PPM, implanted 08/08/10  Past Medical History:  Diagnosis Date  . Chicken pox   . Conductive hearing loss, childhood onset   . 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 Epilepsy, followed by Dr. Tomma Rakers in neurology 08/26/2012  . Hx of adenomatous colonic polyps 11/19/2016  . Pacemaker   . Pacemaker    2012  . Seizure ([place])   . Seizures ([place])    Petite Mal     Past Surgical History:  Procedure Laterality Date  . HIP ARTHROPLASTY    . PACEMAKER INSERTION  07/2010  . TOTAL HIP ARTHROPLASTY  07/2011   right  . TOTAL HIP ARTHROPLASTY  03/2012   left    Current Outpatient Medications  Medication Sig Dispense Refill  . Ascorbic Acid (VITAMIN C PO) Take 1 tablet by mouth daily.     . Cholecalciferol (VITAMIN D) 2000 UNITS CAPS Take by mouth 2 (two) times daily.    . fish oil-omega-3 fatty acids 1000 MG capsule Take 2 g by mouth 2 (two) times daily.    . LamoTRIgine 300 MG TB24 24 hour tablet TAKE 1 TABLET BY MOUTH TWICE DAILY 180 tablet 2  . magnesium 30 MG tablet Take 30 mg by mouth daily.     . Multiple Vitamin (MULTIVITAMIN WITH MINERALS) TABS tablet Take 1 tablet by mouth daily.    . Oxcarbazepine (TRILEPTAL) 300 MG tablet Take 2 tablets (600 mg total) by mouth 2 (two) times daily. TAKE ONE TABLET BY MOUTH IN  THE MORNING AND TWO IN THE EVENING 120 tablet 0   Current Facility-Administered Medications  Medication Dose Route Frequency Provider Last Rate Last Dose  . 0.9 %  sodium chloride infusion  500 mL Intravenous Continuous Gatha Mayer, MD        Allergies:   Patient has no known allergies.   Social History:  The patient  reports that  has never smoked. she has never used smokeless tobacco. She reports that she does not drink alcohol or use drugs.   Family History:  The patient's family history includes Colon cancer in her father; Diabetes in her father; Hypertension in her father and mother.  ROS:  Please see the history of present illness.   All other systems are reviewed and otherwise negative.   PHYSICAL EXAM:  VS:  BP 108/70   Pulse 60   Ht 5\' 3"  (1.6 m)   Wt 173 lb (78.5 kg)   LMP 08/29/2005   BMI 30.65 kg/m  BMI: Body mass index is 30.65 kg/m. Well nourished, well developed, in no acute distress  HEENT: normocephalic, atraumatic  Neck: no JVD, carotid bruits or masses Cardiac:   RRR; no significant murmurs, no rubs, or gallops Lungs:  CTA b/l, no wheezing, rhonchi or rales  Abd: soft, nontender MS:  no deformity or atrophy Ext: no edema  Skin: warm and dry, no rash Neuro:  No gross deficits appreciated Psych: euthymic mood, full affect  PPM site is stable, no tethering or discomfort   EKG:  Done today and reviewed by myself SR,  60bpm, 1st degree AVBlock, 22ms, QRS 69ms PPM interrogation done today and reviewed by myself: battery and lead measurements are good, no rate drop episodes, 99.9% AS/VS  Recent Labs: 07/03/2016: ALT 19 05/15/2017: BUN 9; Creat 0.77; Potassium 4.8; Sodium 132  08/14/2016: Cholesterol 217; HDL 69.90   Estimated Creatinine Clearance: 73.2 mL/min (by C-G formula based on SCr of 0.77 mg/dL).   Wt Readings from Last 3 Encounters:  05/28/17 173 lb (78.5 kg)  05/18/17 172 lb 6.4 oz (78.2 kg)  04/16/17 175 lb 1.6 oz (79.4 kg)     Other  studies reviewed: Additional studies/records reviewed today include: summarized above  ASSESSMENT AND PLAN:  1. PPM     Intact function, no changes made      (outside of updating her name change, was still listed as Alford Highland on her device, was her previous married name)     Programmed w/both RDR programs on, no episodes  2. BP      Looks good today, was reported high at her neurology visit, she reports never having been found with high BP previously   Disposition: F/u with q 3 month remotes, will see her back in clinic in 1 year, sooner if needed.  Current medicines are reviewed at length with the patient today.  The patient did not have any concerns regarding medicines.  [name] Night, PA-C 05/28/2017 11:33 AM     [facility] [address] [place] [address] [phone] (office)  [phone] (fax)

## 2017-05-28 ENCOUNTER — Ambulatory Visit (INDEPENDENT_AMBULATORY_CARE_PROVIDER_SITE_OTHER): Payer: Medicare Other | Admitting: Physician Assistant

## 2017-05-28 ENCOUNTER — Ambulatory Visit (INDEPENDENT_AMBULATORY_CARE_PROVIDER_SITE_OTHER): Payer: Medicare Other | Admitting: *Deleted

## 2017-05-28 DIAGNOSIS — I495 Sick sinus syndrome: Secondary | ICD-10-CM

## 2017-05-28 DIAGNOSIS — Z950 Presence of cardiac pacemaker: Secondary | ICD-10-CM | POA: Diagnosis not present

## 2017-05-28 NOTE — Patient Instructions (Addendum)
Medication Instructions:   Your physician recommends that you continue on your current medications as directed. Please refer to the Current Medication list given to you today.   If you need a refill on your cardiac medications before your next appointment, please call your pharmacy.  Labwork: NONE ORDERED  TODAY    Testing/Procedures: NONE ORDERED  TODAY    Follow-Up:  Your physician wants you to follow-up in: [place] will receive a reminder letter in the mail two months in advance. If you don't receive a letter, please call our office to schedule the follow-up appointment.  Remote monitoring is used to monitor your Pacemaker of ICD from home. This monitoring reduces the number of office visits required to check your device to one time per year. It allows [place] to keep an eye on the functioning of your device to ensure it is working properly. You are scheduled for a device check from home on . 4-29-18You may send your transmission at any time that day. If you have a wireless device, the transmission will be sent automatically. After your physician reviews your transmission, you will receive a postcard with your next transmission date.     Any Other Special Instructions Will Be Listed Below (If Applicable).

## 2017-05-28 NOTE — Progress Notes (Signed)
Remote pacemaker transmission.   

## 2017-05-29 LAB — CUP PACEART REMOTE DEVICE CHECK
Battery Impedance: 296 Ohm
Battery Remaining Longevity: 125 mo
Battery Voltage: 2.78 V
Brady Statistic AP VP Percent: 0 %
Brady Statistic AS VP Percent: 0 %
Date Time Interrogation Session: 20190128151116
Implantable Lead Implant Date: 20120409
Implantable Lead Location: 753859
Implantable Lead Model: 4076
Implantable Lead Model: 4076
Implantable Pulse Generator Implant Date: 20120409
Lead Channel Pacing Threshold Amplitude: 0.75 V
Lead Channel Pacing Threshold Pulse Width: 0.4 ms
Lead Channel Sensing Intrinsic Amplitude: 16 mV
Lead Channel Sensing Intrinsic Amplitude: 2.8 mV
Lead Channel Setting Pacing Amplitude: 2.5 V
MDC IDC LEAD IMPLANT DT: 20120409
MDC IDC LEAD LOCATION: 753860
MDC IDC MSMT LEADCHNL RA IMPEDANCE VALUE: 580 Ohm
MDC IDC MSMT LEADCHNL RV IMPEDANCE VALUE: 666 Ohm
MDC IDC MSMT LEADCHNL RV PACING THRESHOLD AMPLITUDE: 0.5 V
MDC IDC MSMT LEADCHNL RV PACING THRESHOLD PULSEWIDTH: 0.4 ms
MDC IDC SET LEADCHNL RA PACING AMPLITUDE: 2 V
MDC IDC SET LEADCHNL RV PACING PULSEWIDTH: 0.4 ms
MDC IDC SET LEADCHNL RV SENSING SENSITIVITY: 5.6 mV
MDC IDC STAT BRADY AP VS PERCENT: 0 %
MDC IDC STAT BRADY AS VS PERCENT: 100 %

## 2017-05-30 ENCOUNTER — Encounter: Payer: Self-pay | Admitting: Cardiology

## 2017-08-15 ENCOUNTER — Ambulatory Visit: Payer: Medicare Other

## 2017-08-16 ENCOUNTER — Encounter: Payer: Self-pay | Admitting: Family Medicine

## 2017-08-17 ENCOUNTER — Encounter: Payer: Self-pay | Admitting: Family Medicine

## 2017-08-18 NOTE — Progress Notes (Signed)
HPI:  Using dictation device. Unfortunately this device frequently misinterprets words/phrases.  Here for CPE:  -Concerns and/or follow up today: skin lesion R hand popped up over a few months. Was red initially - now has turned white. No pain or pruritis.  Chronic medical problems summarized below were reviewed for changes.  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 Ulanda Edison. Reports last pap 2 years ago and told normal. -FDLMP: see nursing notes -wants STI testing (Hep C if born [birth year]): no except agreed to hep c screening -FH breast, colon or ovarian ca: see FH Last mammogram: due - she cant rememeber Last colon cancer screening: refused colon cancer screening in the past - then agreed to cologuard. Reports she completed it and sent it in this year Breast Ca Risk Assessment: see family history and pt history DEXA (>/= 67): n/a  -Alcohol, Tobacco, drug use: see social history  Review of Systems - no fevers, unintentional weight loss, vision loss, hearing loss, chest pain, sob, hemoptysis, melena, hematochezia, hematuria, genital discharge, changing or concerning skin lesions,  bleeding, bruising, loc, thoughts of self harm or SI  Past Medical History:  Diagnosis Date  . Chicken pox   . Conductive hearing loss, childhood onset   . 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 Epilepsy, followed by Dr. Tomma Rakers in neurology 08/26/2012  . Hx of adenomatous colonic polyps 11/19/2016  . Pacemaker   . Pacemaker    2012  . Seizure ([place])   . Seizures ([place])    [place]     Past Surgical History:  Procedure Laterality Date  . HIP ARTHROPLASTY    . PACEMAKER INSERTION  07/2010  . TOTAL HIP ARTHROPLASTY  07/2011   right  . TOTAL HIP ARTHROPLASTY  03/2012   left    Family History  Problem Relation Age of Onset  . Diabetes Father   . Hypertension Father   . Colon cancer Father   . Hypertension Mother   . Esophageal cancer Neg Hx   . Rectal cancer Neg Hx   . Stomach cancer Neg Hx     Social History   Socioeconomic History  . Marital status: Divorced    Spouse name: Not on file  . Number of children: Not on file  . Years of education: Not on file  . Highest education level: Not on file  Occupational History  . Not on file  Social Needs  . Financial resource strain: Not on file  . Food insecurity:    Worry: Not on file    Inability: Not on file  . Transportation needs:    Medical: Not on file    Non-medical: Not on file  Tobacco Use  . Smoking  status: Never Smoker  . Smokeless tobacco: Never Used  Substance and Sexual Activity  . Alcohol use: No    Alcohol/week: 0.0 oz    Comment: none  . Drug use: No  . Sexual activity: Never  Lifestyle  . Physical activity:    Days per week: Not on file    Minutes per session: Not on file  . Stress: Not on file  Relationships  . Social connections:    Talks on phone: Not on file    Gets together: Not on file    Attends religious service: Not on file    Active member of club or organization: Not on file    Attends meetings of clubs or organizations: Not on file    Relationship status: Not  on file  Other Topics Concern  . Not on file  Social History Narrative   ** Merged History Encounter **       Work or School: on disability for the deafness, seizure and carpal tunnel  Home Situation: lives with mother and father currently - since 06/2012  Spiritual Beliefs: Christian  Lifestyle: walks for 15-20 minutes a few times per week, well balanced d   iet        Current Outpatient Medications:  .  Ascorbic Acid (VITAMIN C PO), Take 1 tablet by mouth daily. , Disp: , Rfl:  .  Cholecalciferol (VITAMIN D) 2000 UNITS CAPS, Take by mouth 2 (two) times daily., Disp: , Rfl:  .  fish oil-omega-3 fatty acids 1000 MG capsule, Take 2 g by mouth 2 (two) times daily., Disp: , Rfl:  .  LamoTRIgine 300 MG TB24 24 hour tablet, TAKE 1 TABLET BY MOUTH TWICE DAILY, Disp: 180 tablet, Rfl: 2 .  magnesium 30 MG tablet, Take 30 mg by mouth daily. , Disp: , Rfl:  .  Multiple Vitamin (MULTIVITAMIN WITH MINERALS) TABS tablet, Take 1 tablet by mouth daily., Disp: , Rfl:  .  Oxcarbazepine (TRILEPTAL) 300 MG tablet, Take 2 tablets (600 mg total) by mouth 2 (two) times daily. TAKE ONE TABLET BY MOUTH IN THE MORNING AND TWO IN THE EVENING, Disp: 120 tablet, Rfl: 0  Current Facility-Administered Medications:  .  0.9 %  sodium chloride infusion, 500 mL, Intravenous, Continuous, Gatha Mayer, MD  EXAM:  Vitals:   08/20/17 1058  BP: 108/80  Pulse: 66  Temp: (!) 97.4 F (36.3 C)   Body mass index is 30.75 kg/m.  GENERAL: vitals reviewed and listed below, alert, oriented, appears well hydrated and in no acute distress  HEENT: head atraumatic, PERRLA, normal appearance of eyes, ears, nose and mouth. moist mucus membranes.  NECK: supple, no masses or lymphadenopathy  LUNGS: clear to auscultation bilaterally, no rales, rhonchi or wheeze  CV: HRRR, no peripheral edema or cyanosis, normal pedal pulses  ABDOMEN: bowel sounds normal, soft, non tender to palpation, no masses, no rebound or  guarding  BREAST: normal appearance - no skin lesions or discharge noted on inspection of both breasts, on palpation of both breast and axillary region no suspicious lesions appreciated today - some density that seems to be scar tissue around surgical scar upper L breast she reports is unchanged  GU: normal appearance of external genitalia - no lesions or masses appreciated, normal appearing vaginal mucosa - no abnormal discharge, normal appearance of cervix - no lesions or abnormal discharge observed  RECTAL: deferred  SKIN: no rash or abnormal lesions except hyperkeratotic lesion on erythematous base R dorsal hand  MS:  normal gait, moves all extremities normally  NEURO: normal gait, speech and thought processing grossly intact, muscle tone grossly intact throughout  PSYCH: normal affect, pleasant and cooperative  ASSESSMENT AND PLAN:  Discussed the following assessment and plan:  PREVENTIVE EXAM: -Discussed and advised all [place] preventive services health task force level A and B recommendations for age, sex and risks. -Advised at least 150 minutes of exercise per week and a healthy diet with avoidance of (less then 1 serving per week) processed foods, white starches, red meat, fast foods and sweets and consisting of: * 5-9 servings of fresh fruits and vegetables (not corn or potatoes) *nuts and seeds, beans *olives and olive oil *lean meats such as fish and white chicken  *whole grains -labs, studies and vaccines per orders this encounter - Hemoglobin A1c - Lipid panel -advised to get mammogram and advised assistant to order -pap obtained as she seems unclear on details of last pap and hx abnormal - she reports was done since and was normal  2. Screening for depression -see phq9  3. Cutaneous horn -advised of potential etiologies - Ambulatory referral to Dermatology  4. Encounter for gynecological examination with abnormal finding -see above  5. Need for hepatitis C  screening test - Hepatitis C antibody  6. Obesity/hyperlip -labs -lifestyle recs   Patient advised to return to clinic immediately if symptoms worsen or persist or new concerns.  Patient Instructions  BEFORE YOU LEAVE: -labs -? cologuard -[name], please refer for mammogram at breast center - place orders -follow up: as scheduled in May for AWV  -We placed a referral for you as discussed. It usually takes about 1-2 weeks to process and schedule this referral. If you have not heard from [place] regarding this appointment in 2 weeks please contact our office.  We have ordered labs or studies at this visit. It can take up to 1-2 weeks for results and processing. IF results require follow up or explanation, we will call you with instructions. Clinically stable results will be released to your [facility]. If you have not heard from [place] or cannot find your results in [facility] in 2 weeks please contact our office at [phone].  If you are not yet signed up for [facility], please consider signing up.     Preventive Care 40-64 Years, Female Preventive care refers to lifestyle choices and visits with your health care provider that can promote health and wellness. What does preventive care include?  A yearly physical exam. This is also called an annual well check.  Dental exams once or twice a year.  Routine eye exams. Ask your health care provider how often you should have your eyes checked.  Personal lifestyle choices, including: ? Daily care of your teeth and gums. ? Regular physical activity. ? Eating a healthy diet. ? Avoiding tobacco and drug use. ? Limiting alcohol use. ? Practicing safe sex. ? Taking low-dose aspirin daily starting at age 64. ? Taking vitamin and mineral supplements as recommended by your health care provider. What happens during an annual well check? The services and screenings done by your health care provider during your annual well check will depend on your age,  overall health, lifestyle risk factors, and family history of disease. Counseling Your health care provider may ask you questions about your:  Alcohol use.  Tobacco use.  Drug use.  Emotional well-being.  Home and relationship well-being.  Sexual activity.  Eating habits.  Work and work Statistician.  Method of birth control.  Menstrual cycle.  Pregnancy history.  Screening You may have the following tests or measurements:  Height, weight, and BMI.  Blood pressure.  Lipid and cholesterol levels. These may be checked every 5 years, or more frequently if you are over 29 years old.  Skin check.  Lung cancer screening. You may have this screening every year starting at age 14 if you have a 30-pack-year history of smoking and currently smoke or have quit within the past 15 years.  Fecal occult blood test (FOBT) of the stool. You may have this test every year starting at age 40.  Flexible sigmoidoscopy or colonoscopy. You may have a sigmoidoscopy every 5 years or a colonoscopy every 10 years starting at age 6.  Hepatitis C blood test.  Hepatitis B blood test.  Sexually transmitted disease (STD) testing.  Diabetes screening. This is done by checking your blood sugar (glucose) after you have not eaten for a while (fasting). You may have this done every 1-3 years.  Mammogram. This may be done every 1-2 years. Talk to your health care provider about when you should start having regular mammograms. This may depend on whether you have a family history of breast cancer.  BRCA-related cancer screening. This may be done if you have a family history of breast, ovarian, tubal, or peritoneal cancers.  Pelvic exam and Pap test. This may be done every 3 years starting at age 27. Starting at age 50, this may be done every 5 years if you have a Pap test in combination with an HPV test.  Bone density scan. This is done to screen for osteoporosis. You may have this scan if you are at  high risk for osteoporosis.  Discuss your test results, treatment options, and if necessary, the need for more tests with your health care provider. Vaccines Your health care provider may recommend certain vaccines, such as:  Influenza vaccine. This is recommended every year.  Tetanus, diphtheria, and acellular pertussis (Tdap, Td) vaccine. You may need a Td booster every 10 years.  Varicella vaccine. You may need this if you have not been vaccinated.  Zoster vaccine. You may need this after age 31.  Measles, mumps, and rubella (MMR) vaccine. You may need at least one dose of MMR if you were born in 1957 or later. You may also need a second dose.  Pneumococcal 13-valent conjugate (PCV13) vaccine. You may need this if you have certain conditions and were not previously vaccinated.  Pneumococcal polysaccharide (PPSV23) vaccine. You may need one or two doses if you smoke cigarettes or if you have certain conditions.  Meningococcal vaccine. You may need this if you have certain conditions.  Hepatitis A vaccine. You may need this if you have certain conditions or if you travel or work in places where you may be exposed to hepatitis A.  Hepatitis B vaccine. You may need this if you have certain conditions or if you travel or work in places where you may be exposed to hepatitis B.  Haemophilus influenzae type b (Hib) vaccine. You may need this if you have certain conditions.  Talk to your health care provider about which screenings and vaccines you need and how often you need them. This information is not intended to replace advice given to you by your health care provider. Make sure you discuss any questions you have with your health care provider. Document Released: 05/14/2015 Document Revised: 01/05/2016 Document Reviewed: 02/16/2015 Elsevier Interactive Patient Education  [employer].  No follow-ups on file.  Hannah R Kim, DO   

## 2017-08-20 ENCOUNTER — Other Ambulatory Visit (HOSPITAL_COMMUNITY)
Admission: RE | Admit: 2017-08-20 | Discharge: 2017-08-20 | Disposition: A | Discharge status: Home / Self Care | Payer: Medicare Other | Source: Ambulatory Visit | Attending: Family Medicine | Admitting: Family Medicine

## 2017-08-20 ENCOUNTER — Ambulatory Visit (INDEPENDENT_AMBULATORY_CARE_PROVIDER_SITE_OTHER): Payer: Medicare Other | Admitting: Family Medicine

## 2017-08-20 ENCOUNTER — Encounter: Payer: Self-pay | Admitting: Family Medicine

## 2017-08-20 VITALS — BP 108/80 | HR 66 | Temp 97.4°F | Ht 63.0 in | Wt 173.6 lb

## 2017-08-20 DIAGNOSIS — L858 Other specified epidermal thickening: Secondary | ICD-10-CM | POA: Diagnosis not present

## 2017-08-20 DIAGNOSIS — E785 Hyperlipidemia, unspecified: Secondary | ICD-10-CM

## 2017-08-20 DIAGNOSIS — Z1159 Encounter for screening for other viral diseases: Secondary | ICD-10-CM

## 2017-08-20 DIAGNOSIS — E669 Obesity, unspecified: Secondary | ICD-10-CM

## 2017-08-20 DIAGNOSIS — Z124 Encounter for screening for malignant neoplasm of cervix: Secondary | ICD-10-CM | POA: Insufficient documentation

## 2017-08-20 DIAGNOSIS — Z1331 Encounter for screening for depression: Secondary | ICD-10-CM

## 2017-08-20 DIAGNOSIS — Z01411 Encounter for gynecological examination (general) (routine) with abnormal findings: Secondary | ICD-10-CM | POA: Diagnosis not present

## 2017-08-20 DIAGNOSIS — Z0000 Encounter for general adult medical examination without abnormal findings: Secondary | ICD-10-CM

## 2017-08-20 DIAGNOSIS — Z1151 Encounter for screening for human papillomavirus (HPV): Secondary | ICD-10-CM | POA: Insufficient documentation

## 2017-08-20 DIAGNOSIS — Z1239 Encounter for other screening for malignant neoplasm of breast: Secondary | ICD-10-CM

## 2017-08-20 DIAGNOSIS — Z1231 Encounter for screening mammogram for malignant neoplasm of breast: Secondary | ICD-10-CM

## 2017-08-20 DIAGNOSIS — Z0001 Encounter for general adult medical examination with abnormal findings: Secondary | ICD-10-CM | POA: Diagnosis not present

## 2017-08-20 LAB — LIPID PANEL
CHOLESTEROL: 199 mg/dL (ref 0–200)
HDL: 73.1 mg/dL (ref >39.00)
LDL CALC: 117 mg/dL — AB (ref 0–99)
NonHDL: 125.51
Total CHOL/HDL Ratio: 3
Triglycerides: 43 mg/dL (ref 0.0–149.0)
VLDL: 8.6 mg/dL (ref 0.0–40.0)

## 2017-08-20 LAB — HEMOGLOBIN A1C: HEMOGLOBIN A1C: 5.4 % (ref 4.6–6.5)

## 2017-08-20 NOTE — Patient Instructions (Addendum)
BEFORE YOU LEAVE: -labs -? cologuard -[name], please refer for mammogram at breast center - place orders -follow up: as scheduled in May for AWV  -We placed a referral for you as discussed. It usually takes about 1-2 weeks to process and schedule this referral. If you have not heard from [place] regarding this appointment in 2 weeks please contact our office.  We have ordered labs or studies at this visit. It can take up to 1-2 weeks for results and processing. IF results require follow up or explanation, we will call you with instructions. Clinically stable results will be released to your [facility]. If you have not heard from [place] or cannot find your results in [facility] in 2 weeks please contact our office at [phone].  If you are not yet signed up for [facility], please consider signing up.     Preventive Care 40-64 Years, Female Preventive care refers to lifestyle choices and visits with your health care provider that can promote health and wellness. What does preventive care include?  A yearly physical exam. This is also called an annual well check.  Dental exams once or twice a year.  Routine eye exams. Ask your health care provider how often you should have your eyes checked.  Personal lifestyle choices, including: ? Daily care of your teeth and gums. ? Regular physical activity. ? Eating a healthy diet. ? Avoiding tobacco and drug use. ? Limiting alcohol use. ? Practicing safe sex. ? Taking low-dose aspirin daily starting at age 62. ? Taking vitamin and mineral supplements as recommended by your health care provider. What happens during an annual well check? The services and screenings done by your health care provider during your annual well check will depend on your age, overall health, lifestyle risk factors, and family history of disease. Counseling Your health care provider may ask you questions about your:  Alcohol use.  Tobacco use.  Drug use.  Emotional  well-being.  Home and relationship well-being.  Sexual activity.  Eating habits.  Work and work Statistician.  Method of birth control.  Menstrual cycle.  Pregnancy history.  Screening You may have the following tests or measurements:  Height, weight, and BMI.  Blood pressure.  Lipid and cholesterol levels. These may be checked every 5 years, or more frequently if you are over 50 years old.  Skin check.  Lung cancer screening. You may have this screening every year starting at age 54 if you have a 30-pack-year history of smoking and currently smoke or have quit within the past 15 years.  Fecal occult blood test (FOBT) of the stool. You may have this test every year starting at age 3.  Flexible sigmoidoscopy or colonoscopy. You may have a sigmoidoscopy every 5 years or a colonoscopy every 10 years starting at age 20.  Hepatitis C blood test.  Hepatitis B blood test.  Sexually transmitted disease (STD) testing.  Diabetes screening. This is done by checking your blood sugar (glucose) after you have not eaten for a while (fasting). You may have this done every 1-3 years.  Mammogram. This may be done every 1-2 years. Talk to your health care provider about when you should start having regular mammograms. This may depend on whether you have a family history of breast cancer.  BRCA-related cancer screening. This may be done if you have a family history of breast, ovarian, tubal, or peritoneal cancers.  Pelvic exam and Pap test. This may be done every 3 years starting at age 60.  Starting at age 45, this may be done every 5 years if you have a Pap test in combination with an HPV test.  Bone density scan. This is done to screen for osteoporosis. You may have this scan if you are at high risk for osteoporosis.  Discuss your test results, treatment options, and if necessary, the need for more tests with your health care provider. Vaccines Your health care provider may recommend  certain vaccines, such as:  Influenza vaccine. This is recommended every year.  Tetanus, diphtheria, and acellular pertussis (Tdap, Td) vaccine. You may need a Td booster every 10 years.  Varicella vaccine. You may need this if you have not been vaccinated.  Zoster vaccine. You may need this after age 59.  Measles, mumps, and rubella (MMR) vaccine. You may need at least one dose of MMR if you were born in 1957 or later. You may also need a second dose.  Pneumococcal 13-valent conjugate (PCV13) vaccine. You may need this if you have certain conditions and were not previously vaccinated.  Pneumococcal polysaccharide (PPSV23) vaccine. You may need one or two doses if you smoke cigarettes or if you have certain conditions.  Meningococcal vaccine. You may need this if you have certain conditions.  Hepatitis A vaccine. You may need this if you have certain conditions or if you travel or work in places where you may be exposed to hepatitis A.  Hepatitis B vaccine. You may need this if you have certain conditions or if you travel or work in places where you may be exposed to hepatitis B.  Haemophilus influenzae type b (Hib) vaccine. You may need this if you have certain conditions.  Talk to your health care provider about which screenings and vaccines you need and how often you need them. This information is not intended to replace advice given to you by your health care provider. Make sure you discuss any questions you have with your health care provider. Document Released: 05/14/2015 Document Revised: 01/05/2016 Document Reviewed: 02/16/2015 Elsevier Interactive Patient Education  [employer].

## 2017-08-21 LAB — HEPATITIS C ANTIBODY
Hepatitis C Ab: NONREACTIVE
SIGNAL TO CUT-OFF: 0.01 (ref <1.00)

## 2017-08-22 LAB — CYTOLOGY - PAP
Diagnosis: NEGATIVE
HPV: DETECTED — AB

## 2017-08-24 ENCOUNTER — Encounter: Payer: Self-pay | Admitting: Neurology

## 2017-08-27 ENCOUNTER — Encounter: Payer: Self-pay | Admitting: *Deleted

## 2017-08-27 ENCOUNTER — Ambulatory Visit (INDEPENDENT_AMBULATORY_CARE_PROVIDER_SITE_OTHER): Payer: Medicare Other | Admitting: *Deleted

## 2017-08-27 DIAGNOSIS — I495 Sick sinus syndrome: Secondary | ICD-10-CM

## 2017-08-27 NOTE — Progress Notes (Signed)
Remote pacemaker transmission.   

## 2017-08-28 ENCOUNTER — Encounter: Payer: Self-pay | Admitting: Cardiology

## 2017-08-28 NOTE — Progress Notes (Signed)
Letter  

## 2017-08-29 LAB — CUP PACEART REMOTE DEVICE CHECK
Battery Impedance: 344 Ohm
Battery Remaining Longevity: 120 mo
Battery Voltage: 2.78 V
Brady Statistic AP VP Percent: 0 %
Implantable Lead Implant Date: 20120409
Implantable Lead Location: 753860
Implantable Pulse Generator Implant Date: 20120409
Lead Channel Impedance Value: 598 Ohm
Lead Channel Pacing Threshold Amplitude: 0.5 V
Lead Channel Setting Pacing Amplitude: 2 V
Lead Channel Setting Pacing Amplitude: 2.5 V
Lead Channel Setting Sensing Sensitivity: 5.6 mV
MDC IDC LEAD IMPLANT DT: 20120409
MDC IDC LEAD LOCATION: 753859
MDC IDC MSMT LEADCHNL RA PACING THRESHOLD AMPLITUDE: 0.75 V
MDC IDC MSMT LEADCHNL RA PACING THRESHOLD PULSEWIDTH: 0.4 ms
MDC IDC MSMT LEADCHNL RV IMPEDANCE VALUE: 664 Ohm
MDC IDC MSMT LEADCHNL RV PACING THRESHOLD PULSEWIDTH: 0.4 ms
MDC IDC SESS DTM: 20190429124522
MDC IDC SET LEADCHNL RV PACING PULSEWIDTH: 0.4 ms
MDC IDC STAT BRADY AP VS PERCENT: 0 %
MDC IDC STAT BRADY AS VP PERCENT: 0 %
MDC IDC STAT BRADY AS VS PERCENT: 100 %

## 2017-08-30 ENCOUNTER — Encounter: Payer: Self-pay | Admitting: Family Medicine

## 2017-08-30 ENCOUNTER — Ambulatory Visit (INDEPENDENT_AMBULATORY_CARE_PROVIDER_SITE_OTHER): Payer: Medicare Other | Admitting: Family Medicine

## 2017-08-30 VITALS — BP 122/62 | HR 75 | Temp 98.3°F | Ht 63.0 in | Wt 173.9 lb

## 2017-08-30 DIAGNOSIS — Z0000 Encounter for general adult medical examination without abnormal findings: Secondary | ICD-10-CM

## 2017-08-30 DIAGNOSIS — R87618 Other abnormal cytological findings on specimens from cervix uteri: Secondary | ICD-10-CM

## 2017-08-30 DIAGNOSIS — Z1331 Encounter for screening for depression: Secondary | ICD-10-CM

## 2017-08-30 NOTE — Patient Instructions (Addendum)
BEFORE YOU LEAVE: -Print and provide her with her last Pap smear results to take to her gynecologist -Please print her the appointment details for her mammogram this summer -follow up:  1) follow up with dr. Maudie Mercury in 6 months 2) CPE with Dr. Maudie Mercury and AWV with Manuela Schwartz in 1 year - cancel existing CPE so can do on the same day next year on a Tuesday. Thanks.  Ms. [name] [name] , Thank you for taking time to come for your Medicare Wellness Visit. I appreciate your ongoing commitment to your health goals. Please review the following plan we discussed and let me know if I can assist you in the future.   These are the goals we discussed:  -Please see your gynecologist in follow-up regarding your abnormal Pap smear.  -Please get the mammogram as scheduled.   -Eat a healthy low sugar diet and continue to get regular exercise.  Shoot for a weight reduction of about 10 pounds over the next 3 to 4 months.  This is a list of the screening recommended for you and due dates:  Health Maintenance  Topic Date Due  . Mammogram  02/19/2018*  . HIV Screening  07/29/2020*  . Flu Shot  11/29/2017  . Pap Smear  08/20/2020  . Tetanus Vaccine  09/29/2021  . Colon Cancer Screening  11/13/2021  .  Hepatitis C: One time screening is recommended by Center for Disease Control  (CDC) for  adults born from [birth year] through 1965.   Completed  *Topic was postponed. The date shown is not the original due date.     We recommend the following healthy lifestyle for LIFE: 1) Small portions. But, make sure to get regular (at least 3 per day), healthy meals and small healthy snacks if needed.  2) Eat a healthy clean diet.   TRY TO EAT: -at least 5-7 servings of low sugar, colorful, and nutrient rich vegetables per day (not corn, potatoes or bananas.) -berries are the best choice if you wish to eat fruit (only eat small amounts if trying to reduce weight)  -lean meets (fish, white meat of chicken or [place]) -vegan proteins for some  meals - beans or tofu, whole grains, nuts and seeds -Replace bad fats with good fats - good fats include: fish, nuts and seeds, canola oil, olive oil -small amounts of low fat or non fat dairy -small amounts of100 % whole grains - check the lables -drink plenty of water  AVOID: -SUGAR, sweets, anything with added sugar, corn syrup or sweeteners - must read labels as even foods advertised as "healthy" often are loaded with sugar -if you must have a sweetener, small amounts of stevia may be best -sweetened beverages and artificially sweetened beverages -simple starches (rice, bread, potatoes, pasta, chips, etc - small amounts of 100% whole grains are ok) -red meat, pork, butter -fried foods, fast food, processed food, excessive dairy, eggs and coconut.  3)Get at least 150 minutes of sweaty aerobic exercise per week.  4)Reduce stress - consider counseling, meditation and relaxation to balance other aspects of your life.

## 2017-08-30 NOTE — Progress Notes (Signed)
Medicare Annual Preventive Care Visit  (initial annual wellness or annual wellness exam)  Concerns and/or follow up today:  Review lipid panel, lifestyle interventions, Pap smear follow-up, colonoscopy done in 2018.  Mini cog.  Sees neurology.  [name] [name] is a very pleasant 62 year old here for her Medicare wellness visit.  Her daughter (whom she reports is her HCPOA) and an interpreter are present. She has a past medical history significant for deafness, interpreter is present, obesity, hyperlipidemia, elevated blood pressure, history of syncope status post pacemaker, epilepsy and a history of an abnormal Pap smear.  She recently had a Pap smear, that was abnormal and I did advise that she see the gynecologist again, we had referred her to them in the past.  She was also referred to dermatology recently for a cutaneous horn on the right hand.  She had labs recently in April with mildly elevated cholesterol, improved from in the past.  See HM section in Epic for other details of completed HM. See scanned documentation under Media Tab for further documentation HPI, health risk assessment. See Media Tab and Care Teams sections in Epic for other providers.  ROS: negative for report of fevers, unintentional weight loss, vision changes, vision loss, hearing loss or change, chest pain, sob, hemoptysis, melena, hematochezia, hematuria, genital discharge or lesions, falls, bleeding or bruising, loc, thoughts of suicide or self harm, memory loss  1.) Patient-completed health risk assessment  - completed and reviewed, see scanned documentation  2.) Review of Medical History: -PMH, PSH, Family History and current specialty and care providers reviewed and updated and listed below  - see scanned in document in chart and below  Past Medical History:  Diagnosis Date  . Chicken pox   . Conductive hearing loss, childhood onset   . 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 Epilepsy, followed  by Dr. Tomma Rakers in neurology 08/26/2012  . Hx of adenomatous colonic polyps 11/19/2016  . Pacemaker   . Pacemaker    2012  . Seizure ([place])   . Seizures ([place])    Petite Mal     Past Surgical History:  Procedure Laterality Date  . HIP ARTHROPLASTY    . PACEMAKER INSERTION  07/2010  . TOTAL HIP ARTHROPLASTY  07/2011   right  . TOTAL HIP ARTHROPLASTY  03/2012   left    Social History   Socioeconomic History  . Marital status: Divorced    Spouse name: Not on file  . Number of children: Not on file  . Years of education: Not on file  . Highest education level: Not on file  Occupational History  . Not on file  Social Needs  . Financial resource strain: Not on file  . Food insecurity:    Worry: Not on file    Inability: Not on file  . Transportation needs:    Medical: Not on file    Non-medical: Not on file  Tobacco Use  . Smoking status: Never Smoker  . Smokeless tobacco: Never Used  Substance and Sexual Activity  . Alcohol use: No    Alcohol/week: 0.0 oz    Comment: none  . Drug use: No  . Sexual activity: Never  Lifestyle  . Physical activity:    Days per week: Not on file    Minutes per session: Not on file  . Stress: Not on file  Relationships  . Social connections:    Talks on phone: Not on file  Gets together: Not on file    Attends religious service: Not on file    Active member of club or organization: Not on file    Attends meetings of clubs or organizations: Not on file    Relationship status: Not on file  . Intimate partner violence:    Fear of current or ex partner: Not on file    Emotionally abused: Not on file    Physically abused: Not on file    Forced sexual activity: Not on file  Other Topics Concern  . Not on file  Social History Narrative   ** Merged History Encounter **       Work or School: on disability for the deafness, seizure and carpal tunnel      Home Situation: lives with mother and father currently - since 06/2012      Spiritual  Beliefs: Christian      Lifestyle: walks twice daily          Family History  Problem Relation Age of Onset  . Diabetes Father   . Hypertension Father   . Colon cancer Father   . Hypertension Mother   . Esophageal cancer Neg Hx   . Rectal cancer Neg Hx   . Stomach cancer Neg Hx     Current Outpatient Medications on File Prior to Visit  Medication Sig Dispense Refill  . Ascorbic Acid (VITAMIN C PO) Take 1 tablet by mouth daily.     . Cholecalciferol (VITAMIN D) 2000 UNITS CAPS Take by mouth 2 (two) times daily.    . fish oil-omega-3 fatty acids 1000 MG capsule Take 2 g by mouth 2 (two) times daily.    . LamoTRIgine 300 MG TB24 24 hour tablet TAKE 1 TABLET BY MOUTH TWICE DAILY 180 tablet 2  . magnesium 30 MG tablet Take 30 mg by mouth daily.     . Multiple Vitamin (MULTIVITAMIN WITH MINERALS) TABS tablet Take 1 tablet by mouth daily.    . Oxcarbazepine (TRILEPTAL) 300 MG tablet Take 2 tablets (600 mg total) by mouth 2 (two) times daily. TAKE ONE TABLET BY MOUTH IN THE MORNING AND TWO IN THE EVENING 120 tablet 0   Current Facility-Administered Medications on File Prior to Visit  Medication Dose Route Frequency Provider Last Rate Last Dose  . 0.9 %  sodium chloride infusion  500 mL Intravenous Continuous Gatha Mayer, MD         3.) Review of functional ability and level of safety:  Any difficulty hearing?  See scanned documentation  History of falling?  See scanned documentation  Any trouble with IADLs - using a phone, using transportation, grocery shopping, preparing meals, doing housework, doing laundry, taking medications and managing money?  See scanned documentation  Advance Directives?  Discussed briefly and offered more resources and detailed discussion with our trained staff. Daughter is HCPOA.  See summary of recommendations in Patient Instructions below.  4.) Physical Exam Vitals:   08/30/17 1329  BP: 122/62  Pulse: 75  Temp: 98.3 F (36.8 C)    Estimated body mass index is 30.81 kg/m as calculated from the following:   Height as of this encounter: 5\' 3"  (1.6 m).   Weight as of this encounter: 173 lb 14.4 oz (78.9 kg).  EKG (optional): deferred  General: alert, appear well hydrated and in no acute distress  HEENT: visual acuity grossly intact, see vision screen in chart  CV: HRRR  Lungs: CTA bilaterally  Psych: pleasant and cooperative, no  obvious depression or anxiety  Cognitive function grossly intact; mini cog with 3/3 recall, numbers slightly off on the clock draw in terms of placement - advised to follow up with neurologist  See patient instructions for recommendations.  Education and counseling regarding the above review of health provided with a plan for the following: -see scanned patient completed form for further details -fall prevention strategies discussed  -healthy lifestyle discussed -importance and resources for completing advanced directives discussed -see patient instructions below for any other recommendations provided  4)The following written screening schedule of preventive measures were reviewed with assessment and plan made per below, orders and patient instructions:      AAA screening done if applicable     Alcohol screening done     Obesity Screening and counseling done     STI screening (Hep C if born 1945-65) offered and per pt wishes     Tobacco Screening done       Pneumococcal (PPSV23 -one dose after 64, one before if risk factors), influenza yearly and hepatitis B vaccines (if high risk - end stage renal disease, IV drugs, homosexual men, live in home for mentally retarded, hemophilia receiving factors) ASSESSMENT/PLAN: done if applicable      Screening mammograph (yearly if >40) ASSESSMENT/PLAN:  scheduled for mammogram on 09/2017 - details of appt provided to patient      Screening Pap smear/pelvic exam (q2 years) ASSESSMENT/PLAN: History of abnormal Pap, abnormal on recent Pap,  referred to gynecology for management -negative for intraepithelial lesion or malignancy, but transformation zone component indeterminate because of atrophy and HPV was detected; pt and daughter report she will follow up with her gynecologist, copy of report provided to patient      Colorectal cancer screening (FOBT yearly or flex sig q4y or colonoscopy q10y or barium enema q4y) ASSESSMENT/PLAN patient reports completing the Cologuard: Colon cancer screening test earlier this year; oddly, there is no colonoscopy under the procedures tab in epic, however there is surgical pathology from endoscopy on 10/2016 with notes from Dr. Carlean Purl in gastroenterology of 2 diminutive adenomas, with a recall colonoscopy recommended in 2023.  There is also a GI letter in the letters tab.  The patient did not mention having had a colonoscopy when we discussed colon cancer screening at her last visit.      Diabetes outpatient self-management training services ASSESSMENT/PLAN: utd o      Bone mass measurements(covered q2y if indicated - estrogen def, osteoporosis, hyperparathyroid, vertebral abnormalities, osteoporosis or steroids) ASSESSMENT/PLAN: utd or discussed and ordered per pt wishes      Screening for glaucoma(q1y if high risk - diabetes, FH, AA and > 50 or hispanic and > 65) ASSESSMENT/PLAN: utd or advised      Medical nutritional therapy for individuals with diabetes or renal disease ASSESSMENT/PLAN: Not applicable      Cardiovascular screening blood tests (lipids q5y) ASSESSMENT/PLAN: Up-to-date, reviewed labs, advised a healthy Mediterranean style low sugar diet and regular aerobic exercise      Diabetes screening tests ASSESSMENT/PLAN:  up-to-date   7.) Summary:   Medicare annual wellness visit, subsequent -risk factors and conditions per above assessment were discussed and treatment, recommendations and referrals were offered per documentation above and orders and patient  instructions.  Screening for depression -see scanned form  Other abnormal cytological finding of specimen from cervix -discussed with daughter and patient and advised she follow up with her gynecologist -copy of report provided for her to take to her gynecologist  Patient Instructions  BEFORE YOU LEAVE: -Print and provide her with her last Pap smear results to take to her gynecologist -Please print her the appointment details for her mammogram this summer -follow up: 4 to 6 months  Ms. Pratt , Thank you for taking time to come for your Medicare Wellness Visit. I appreciate your ongoing commitment to your health goals. Please review the following plan we discussed and let me know if I can assist you in the future.   These are the goals we discussed:  -Please see your gynecologist in follow-up regarding your abnormal Pap smear.  -Please get the mammogram as scheduled.   -Eat a healthy low sugar diet and continue to get regular exercise.  Shoot for a weight reduction of about 10 pounds over the next 3 to 4 months.  This is a list of the screening recommended for you and due dates:  Health Maintenance  Topic Date Due  . Mammogram  02/19/2018*  . HIV Screening  07/29/2020*  . Flu Shot  11/29/2017  . Pap Smear  08/20/2020  . Tetanus Vaccine  09/29/2021  . Colon Cancer Screening  11/13/2021  .  Hepatitis C: One time screening is recommended by Center for Disease Control  (CDC) for  adults born from [birth year] through 1965.   Completed  *Topic was postponed. The date shown is not the original due date.     We recommend the following healthy lifestyle for LIFE: 1) Small portions. But, make sure to get regular (at least 3 per day), healthy meals and small healthy snacks if needed.  2) Eat a healthy clean diet.   TRY TO EAT: -at least 5-7 servings of low sugar, colorful, and nutrient rich vegetables per day (not corn, potatoes or bananas.) -berries are the best choice if you wish to  eat fruit (only eat small amounts if trying to reduce weight)  -lean meets (fish, white meat of chicken or [place]) -vegan proteins for some meals - beans or tofu, whole grains, nuts and seeds -Replace bad fats with good fats - good fats include: fish, nuts and seeds, canola oil, olive oil -small amounts of low fat or non fat dairy -small amounts of100 % whole grains - check the lables -drink plenty of water  AVOID: -SUGAR, sweets, anything with added sugar, corn syrup or sweeteners - must read labels as even foods advertised as "healthy" often are loaded with sugar -if you must have a sweetener, small amounts of stevia may be best -sweetened beverages and artificially sweetened beverages -simple starches (rice, bread, potatoes, pasta, chips, etc - small amounts of 100% whole grains are ok) -red meat, pork, butter -fried foods, fast food, processed food, excessive dairy, eggs and coconut.  3)Get at least 150 minutes of sweaty aerobic exercise per week.  4)Reduce stress - consider counseling, meditation and relaxation to balance other aspects of your life.     Lucretia Kern, DO

## 2017-09-10 DIAGNOSIS — N72 Inflammatory disease of cervix uteri: Secondary | ICD-10-CM | POA: Diagnosis not present

## 2017-09-12 DIAGNOSIS — L570 Actinic keratosis: Secondary | ICD-10-CM | POA: Diagnosis not present

## 2017-10-01 ENCOUNTER — Ambulatory Visit
Admission: RE | Admit: 2017-10-01 | Discharge: 2017-10-01 | Disposition: A | Discharge status: Home / Self Care | Payer: Medicare Other | Source: Ambulatory Visit | Attending: Family Medicine | Admitting: Family Medicine

## 2017-10-01 DIAGNOSIS — Z1231 Encounter for screening mammogram for malignant neoplasm of breast: Secondary | ICD-10-CM | POA: Diagnosis not present

## 2017-10-01 DIAGNOSIS — Z1239 Encounter for other screening for malignant neoplasm of breast: Secondary | ICD-10-CM

## 2017-10-10 DIAGNOSIS — L570 Actinic keratosis: Secondary | ICD-10-CM | POA: Diagnosis not present

## 2017-10-10 DIAGNOSIS — D485 Neoplasm of uncertain behavior of skin: Secondary | ICD-10-CM | POA: Diagnosis not present

## 2017-10-14 DIAGNOSIS — B078 Other viral warts: Secondary | ICD-10-CM | POA: Diagnosis not present

## 2017-10-24 DIAGNOSIS — B078 Other viral warts: Secondary | ICD-10-CM | POA: Diagnosis not present

## 2017-10-31 DIAGNOSIS — N72 Inflammatory disease of cervix uteri: Secondary | ICD-10-CM | POA: Diagnosis not present

## 2017-11-08 ENCOUNTER — Telehealth: Payer: Self-pay | Admitting: Neurology

## 2017-11-08 NOTE — Telephone Encounter (Signed)
Called and LMOVM advising labs needed to completed the week prior to OV

## 2017-11-08 NOTE — Telephone Encounter (Signed)
Patient mother wanting to know if pt needs labs done before appt on 7.22.19.

## 2017-11-19 ENCOUNTER — Encounter: Payer: Self-pay | Admitting: Neurology

## 2017-11-19 ENCOUNTER — Ambulatory Visit (INDEPENDENT_AMBULATORY_CARE_PROVIDER_SITE_OTHER): Payer: Medicare Other | Admitting: Neurology

## 2017-11-19 ENCOUNTER — Other Ambulatory Visit (INDEPENDENT_AMBULATORY_CARE_PROVIDER_SITE_OTHER): Payer: Medicare Other

## 2017-11-19 VITALS — BP 126/78 | HR 74 | Ht 63.0 in | Wt 176.0 lb

## 2017-11-19 DIAGNOSIS — R55 Syncope and collapse: Secondary | ICD-10-CM | POA: Diagnosis not present

## 2017-11-19 DIAGNOSIS — G43009 Migraine without aura, not intractable, without status migrainosus: Secondary | ICD-10-CM | POA: Diagnosis not present

## 2017-11-19 DIAGNOSIS — Z5181 Encounter for therapeutic drug level monitoring: Secondary | ICD-10-CM | POA: Diagnosis not present

## 2017-11-19 DIAGNOSIS — G40009 Localization-related (focal) (partial) idiopathic epilepsy and epileptic syndromes with seizures of localized onset, not intractable, without status epilepticus: Secondary | ICD-10-CM | POA: Diagnosis not present

## 2017-11-19 DIAGNOSIS — R42 Dizziness and giddiness: Secondary | ICD-10-CM

## 2017-11-19 LAB — COMPREHENSIVE METABOLIC PANEL
ALT: 19 U/L (ref 0–35)
AST: 19 U/L (ref 0–37)
Albumin: 4.5 g/dL (ref 3.5–5.2)
Alkaline Phosphatase: 88 U/L (ref 39–117)
BUN: 10 mg/dL (ref 6–23)
CHLORIDE: 93 meq/L — AB (ref 96–112)
CO2: 30 mEq/L (ref 19–32)
Calcium: 9.8 mg/dL (ref 8.4–10.5)
Creatinine, Ser: 0.79 mg/dL (ref 0.40–1.20)
GFR: 78.48 mL/min (ref >60.00)
Glucose, Bld: 101 mg/dL — ABNORMAL HIGH (ref 70–99)
POTASSIUM: 4.7 meq/L (ref 3.5–5.1)
SODIUM: 129 meq/L — AB (ref 135–145)
Total Bilirubin: 0.4 mg/dL (ref 0.2–1.2)
Total Protein: 8 g/dL (ref 6.0–8.3)

## 2017-11-19 NOTE — Progress Notes (Signed)
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  She is accompanied by her mother who supplements history.  She is accompanied by an interpreter.   UPDATE: Current medications:  Lamotrigine ER 300mg  twice daily and Trileptal 300mg  in AM and 600mg  at night  She had a brief spell in the kitchen at home.  She was under stress at that time.  Last month, she was out at [facility].  It was a hot day.  Outside, she began feeling dizzy.  It lasted several minutes.  She reports headaches, bifrontal moderate to severe.  They last 6 to 12 hours and occur 4 days a month.  She waits to take Tylenol and often doesn't repeat dose.     HISTORY:     Two types: 1) Staring spells, not fully respond, sometimes aware, tenses up but no convulsions, lasts seconds to a minute.    2) Drop attacks, loses conscious and falls.  Sometimes able to hold on to something.  She has had recurrence of drop attacks as well as dizzy spells for the past year.  It seems to have started following the death of her father last year.  The spells were well controlled up until that time.  She does endorse depression.  She has one spell a month, usually at the end of the month.     Due to recurrence of drop attacks, she had another EEG performed on 06/17/14, which was normal.  Due to dizziness, trough levels of her antiepileptic medications were checked to look for toxicity, which were normal.  Levels were normal.     Prior medications:  She had side effects to Trileptal at 900mg .  Other medications tried, include Dilantin (ineffective), and immediate-release lamotrigine (ineffective).   Prior studies: EEG: focal slowing and frequent spike and wave  activity in left anterior temporal lobe. MRI Brain (04/13/97): focal area of encephalomalacia in posterior left temporal lobe. MRI Brain (10/19/08): possible left mesial temporal sclerosis and lesion in left posterior temporal lobe.   She does not drive.   When she was [age] years old, she developed a virus following a case of the chicken pox. 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  PAST MEDICAL HISTORY: Past Medical History:  Diagnosis Date  . Chicken pox   . Conductive hearing loss, childhood onset   . 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 Epilepsy, followed by Dr. Tomma Rakers in neurology 08/26/2012  . Hx of adenomatous colonic polyps 11/19/2016  . Pacemaker   . Pacemaker    2012  . Seizure ([place])   . Seizures ([place])    Petite Mal     MEDICATIONS: Current Outpatient Medications on File Prior to Visit  Medication Sig Dispense Refill  . Ascorbic Acid (VITAMIN C PO) Take 1 tablet by mouth daily.     . Cholecalciferol (VITAMIN D) 2000 UNITS CAPS Take by mouth 2 (two) times daily.    . fish oil-omega-3 fatty acids 1000 MG capsule Take 2 g by mouth 2 (two) times daily.    . LamoTRIgine 300 MG TB24 24 hour tablet TAKE 1 TABLET BY MOUTH TWICE DAILY 180 tablet  2  . magnesium 30 MG tablet Take 30 mg by mouth daily.     . Multiple Vitamin (MULTIVITAMIN WITH MINERALS) TABS tablet Take 1 tablet by mouth daily.    . Oxcarbazepine (TRILEPTAL) 300 MG tablet Take 2 tablets (600 mg total) by mouth 2 (two) times daily. TAKE ONE TABLET BY MOUTH IN THE MORNING AND TWO IN THE EVENING 120 tablet 0   No current facility-administered medications on file prior to visit.     ALLERGIES: No Known Allergies  FAMILY HISTORY: Family History  Problem Relation Age of Onset  . Diabetes Father   . Hypertension Father   . Colon cancer Father   . Hypertension Mother   . Esophageal cancer Neg Hx   . Rectal cancer Neg Hx   . Stomach cancer Neg Hx      SOCIAL HISTORY: Social History   Socioeconomic History  . Marital status: Divorced    Spouse name: Not on file  . Number of children: Not on file  . Years of education: Not on file  . Highest education level: Not on file  Occupational History  . Not on file  Social Needs  . Financial resource strain: Not on file  . Food insecurity:    Worry: Not on file    Inability: Not on file  . Transportation needs:    Medical: Not on file    Non-medical: Not on file  Tobacco Use  . Smoking status: Never Smoker  . Smokeless tobacco: Never Used  Substance and Sexual Activity  . Alcohol use: No    Alcohol/week: 0.0 oz    Comment: none  . Drug use: No  . Sexual activity: Never  Lifestyle  . Physical activity:    Days per week: Not on file    Minutes per session: Not on file  . Stress: Not on file  Relationships  . Social connections:    Talks on phone: Not on file    Gets together: Not on file    Attends religious service: Not on file    Active member of club or organization: Not on file    Attends meetings of clubs or organizations: Not on file    Relationship status: Not on file  . Intimate partner violence:    Fear of current or ex partner: Not on file    Emotionally abused: Not on file    Physically abused: Not on file    Forced sexual activity: Not on file  Other Topics Concern  . Not on file  Social History Narrative   ** Merged History Encounter **       Work or School: on disability for the deafness, seizure and carpal tunnel      Home Situation: lives with mother and father currently - since 06/2012      Spiritual Beliefs: Christian      Lifestyle: walks twice daily          REVIEW OF SYSTEMS: Constitutional: No fevers, chills, or sweats, no generalized fatigue, change in appetite Eyes: No visual changes, double vision, eye pain Ear, nose and throat: No hearing loss, ear pain, nasal congestion, sore throat Cardiovascular: No chest pain,  palpitations Respiratory:  No shortness of breath at rest or with exertion, wheezes GastrointestinaI: No nausea, vomiting, diarrhea, abdominal pain, fecal incontinence Genitourinary:  No dysuria, urinary retention or frequency Musculoskeletal:  No neck pain, back pain Integumentary: No rash, pruritus, skin lesions Neurological: as above Psychiatric: No depression, insomnia, anxiety Endocrine: No  palpitations, fatigue, diaphoresis, mood swings, change in appetite, change in weight, increased thirst Hematologic/Lymphatic:  No purpura, petechiae. Allergic/Immunologic: no itchy/runny eyes, nasal congestion, recent allergic reactions, rashes  PHYSICAL EXAM: Vitals:   11/19/17 1126  BP: 126/78  Pulse: 74  SpO2: 98%   General: No acute distress.  Patient appears well-groomed.   Head:  Normocephalic/atraumatic Eyes:  Fundi examined but not visualized Neck: supple, no paraspinal tenderness, full range of motion Heart:  Regular rate and rhythm Lungs:  Clear to auscultation bilaterally Back: No paraspinal tenderness Neurological Exam: alert and oriented to person, place, and time. Attention span and concentration intact, recent and remote memory intact, fund of knowledge intact.  Speech fluent and not dysarthric, language intact.  Deaf, otherwise CN II-XII intact. Bulk and tone normal, muscle strength 5/5 throughout.  Sensation to light touch intact.  Deep tendon reflexes 2+ throughout.  Finger to nose  testing intact.  Gait normal.  IMPRESSION: Localization-related epilepsy with complex partial seizures, stable Drop attacks.   Migraines Panic attacks  PLAN: 1.  Continue lamotrigine ER 300mg  twice daily and oxcarbazepine 300mg  in morning and 600mg  at night.  We will repeat a basic metabolic panel. Your provider has requested that you have labwork completed today. Please go to [facility] ([id number]) on the second floor of this building before leaving the office today. You do not  need to check in. If you are not called within 15 minutes please check with the front desk.  2.  Try to stay out of the heat.  If you are outside, bring bottle of water. If you feel dizzy, sit down right away 3.  Follow up in 6 months.  16 minutes spent face to face with patient, over 50% spent discussing management.  Metta Clines, DO  CC:  Dr. Maudie Mercury

## 2017-11-19 NOTE — Patient Instructions (Addendum)
1.  Continue lamotrigine ER 300mg  twice daily and oxcarbazepine 300mg  in morning and 600mg  at night.  We will repeat a basic metabolic panel. Your provider has requested that you have labwork completed today. Please go to [facility] ([id number]) on the second floor of this building before leaving the office today. You do not need to check in. If you are not called within 15 minutes please check with the front desk.  2.  Try to stay out of the heat.  If you are outside, bring bottle of water. If you feel dizzy, sit down right away 3.  Follow up in 6 months.

## 2017-11-26 ENCOUNTER — Telehealth: Payer: Self-pay

## 2017-11-26 ENCOUNTER — Ambulatory Visit (INDEPENDENT_AMBULATORY_CARE_PROVIDER_SITE_OTHER): Payer: Medicare Other | Admitting: *Deleted

## 2017-11-26 DIAGNOSIS — I495 Sick sinus syndrome: Secondary | ICD-10-CM

## 2017-11-26 DIAGNOSIS — Z79899 Other long term (current) drug therapy: Secondary | ICD-10-CM

## 2017-11-26 NOTE — Telephone Encounter (Signed)
-----   Message from Pieter Partridge, DO sent at 11/26/2017 10:56 AM EDT ----- Sodium level is a little low, which is not new (often seen with oxcarbazepine).  I would like to repeat BMP in 4 weeks just to follow up

## 2017-11-26 NOTE — Telephone Encounter (Signed)
Called and LMOVM advising Pt sodium is slightly low and will need to recheck in 4 weeks. I asked her to call and let me know she received this message

## 2017-11-26 NOTE — Progress Notes (Signed)
Remote pacemaker transmission.   

## 2017-11-27 ENCOUNTER — Encounter: Payer: Self-pay | Admitting: Cardiology

## 2017-11-30 LAB — CUP PACEART REMOTE DEVICE CHECK
Battery Impedance: 392 Ohm
Battery Remaining Longevity: 114 mo
Battery Voltage: 2.78 V
Brady Statistic AP VP Percent: 0 %
Brady Statistic AP VS Percent: 0 %
Brady Statistic AS VP Percent: 0 %
Brady Statistic AS VS Percent: 100 %
Date Time Interrogation Session: 20190729122805
Implantable Lead Implant Date: 20120409
Implantable Lead Location: 753859
Implantable Lead Location: 753860
Implantable Lead Model: 4076
Implantable Pulse Generator Implant Date: 20120409
Lead Channel Pacing Threshold Amplitude: 0.5 V
Lead Channel Pacing Threshold Pulse Width: 0.4 ms
Lead Channel Pacing Threshold Pulse Width: 0.4 ms
Lead Channel Sensing Intrinsic Amplitude: 16 mV
Lead Channel Sensing Intrinsic Amplitude: 2.8 mV
Lead Channel Setting Pacing Amplitude: 2.5 V
Lead Channel Setting Pacing Pulse Width: 0.4 ms
Lead Channel Setting Sensing Sensitivity: 5.6 mV
MDC IDC LEAD IMPLANT DT: 20120409
MDC IDC MSMT LEADCHNL RA IMPEDANCE VALUE: 571 Ohm
MDC IDC MSMT LEADCHNL RA PACING THRESHOLD AMPLITUDE: 0.75 V
MDC IDC MSMT LEADCHNL RV IMPEDANCE VALUE: 648 Ohm
MDC IDC SET LEADCHNL RA PACING AMPLITUDE: 2 V

## 2017-12-25 ENCOUNTER — Other Ambulatory Visit (INDEPENDENT_AMBULATORY_CARE_PROVIDER_SITE_OTHER): Payer: Medicare Other

## 2017-12-25 DIAGNOSIS — Z79899 Other long term (current) drug therapy: Secondary | ICD-10-CM

## 2017-12-25 LAB — BASIC METABOLIC PANEL
BUN: 11 mg/dL (ref 6–23)
CALCIUM: 9.9 mg/dL (ref 8.4–10.5)
CO2: 29 meq/L (ref 19–32)
CREATININE: 0.82 mg/dL (ref 0.40–1.20)
Chloride: 97 mEq/L (ref 96–112)
GFR: 75.15 mL/min (ref >60.00)
GLUCOSE: 87 mg/dL (ref 70–99)
Potassium: 4.7 mEq/L (ref 3.5–5.1)
Sodium: 131 mEq/L — ABNORMAL LOW (ref 135–145)

## 2017-12-27 ENCOUNTER — Telehealth: Payer: Self-pay

## 2017-12-27 NOTE — Telephone Encounter (Signed)
-----   Message from Pieter Partridge, DO sent at 12/26/2017  7:08 AM EDT ----- Labs are stable

## 2017-12-27 NOTE — Telephone Encounter (Signed)
Called Pt, LMOVM advising labs are stable

## 2018-01-21 ENCOUNTER — Other Ambulatory Visit: Payer: Self-pay | Admitting: Neurology

## 2018-02-05 ENCOUNTER — Ambulatory Visit: Payer: Self-pay

## 2018-02-06 DIAGNOSIS — M25512 Pain in left shoulder: Secondary | ICD-10-CM | POA: Diagnosis not present

## 2018-02-09 ENCOUNTER — Other Ambulatory Visit: Payer: Self-pay | Admitting: Neurology

## 2018-02-25 ENCOUNTER — Ambulatory Visit (INDEPENDENT_AMBULATORY_CARE_PROVIDER_SITE_OTHER): Payer: Medicare Other | Admitting: *Deleted

## 2018-02-25 DIAGNOSIS — R55 Syncope and collapse: Secondary | ICD-10-CM | POA: Diagnosis not present

## 2018-02-25 DIAGNOSIS — R001 Bradycardia, unspecified: Secondary | ICD-10-CM

## 2018-02-25 NOTE — Progress Notes (Signed)
Remote pacemaker transmission.   

## 2018-03-04 ENCOUNTER — Ambulatory Visit: Payer: Self-pay | Admitting: Family Medicine

## 2018-03-04 NOTE — Progress Notes (Signed)
HPI:  Using dictation device. Unfortunately this device frequently misinterprets words/phrases.  AWV 0/12/6043  She has a complicated PMG significant for deafness, obesity, hyperlipidemia, elevated blood pressure, hx of syncope with pacemaker (sees cardiologist, Dr. Laverta Baltimore), epilepsy (sees neurologist, Dr. [name]) and a hx of abnormal pap and cutaneous horn. Reports doing well not concerns today. Seeing ortho for L shoulder pain and had shot last week. She thinks might be improving a little. She is to follow up with them if not improving. Not moving it much. She reports has seen gyn about the pap. Had flu shot. Denies CP, SOB, DOE. Reports is eating healthy and trying to get regular exercise.   ROS: See pertinent positives and negatives per HPI.  Past Medical History:  Diagnosis Date  . Chicken pox   . Conductive hearing loss, childhood onset   . 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 Epilepsy, followed by Dr. Tomma Rakers in neurology 08/26/2012  . Hx of adenomatous colonic polyps 11/19/2016  . Pacemaker   . Pacemaker    2012  . Seizure ([place])   . Seizures ([place])    Petite Mal     Past Surgical History:  Procedure Laterality Date  . HIP ARTHROPLASTY    . PACEMAKER INSERTION  07/2010  . TOTAL HIP ARTHROPLASTY  07/2011   right  . TOTAL HIP ARTHROPLASTY  03/2012   left    Family History  Problem Relation Age of Onset  . Diabetes Father   . Hypertension Father   . Colon cancer Father   . Hypertension Mother   . Esophageal cancer Neg Hx   . Rectal cancer Neg Hx   . Stomach cancer Neg Hx     SOCIAL HX: see hpi   Current Outpatient Medications:  .  Ascorbic Acid (VITAMIN C PO), Take 1 tablet by mouth daily. , Disp: , Rfl:  .  Cholecalciferol (VITAMIN D) 2000 UNITS CAPS, Take by mouth 2 (two) times daily., Disp: , Rfl:  .  fish oil-omega-3 fatty acids 1000 MG capsule, Take 2 g by mouth 2 (two) times daily., Disp: , Rfl:  .  folic acid (FOLVITE) 1 MG tablet, Take 1 mg  by mouth daily., Disp: , Rfl:  .  LamoTRIgine 300 MG TB24 24 hour tablet, Take 1 tablet (300 mg total) by mouth 2 (two) times daily., Disp: 180 tablet, Rfl: 1 .  magnesium 30 MG tablet, Take 30 mg by mouth daily. , Disp: , Rfl:  .  Multiple Vitamin (MULTIVITAMIN WITH MINERALS) TABS tablet, Take 1 tablet by mouth daily., Disp: , Rfl:  .  Oxcarbazepine (TRILEPTAL) 300 MG tablet, Take 2 tablets (600 mg total) by mouth 2 (two) times daily. TAKE ONE TABLET BY MOUTH IN THE MORNING AND TWO IN THE EVENING, Disp: 120 tablet, Rfl: 0 .  Oxcarbazepine (TRILEPTAL) 300 MG tablet, TAKE 1 TABLET BY MOUTH IN THE MORNING AND 2 TABLETS IN THE EVENING, Disp: 270 tablet, Rfl: 2  EXAM:  Vitals:   03/05/18 1018  BP: 110/80  Pulse: 62  Temp: 97.7 F (36.5 C)  SpO2: 98%    Body mass index is 31.8 kg/m.  GENERAL: vitals reviewed and listed above, alert, oriented, appears well hydrated and in no acute distress  HEENT: atraumatic, conjunttiva clear, no obvious abnormalities on inspection of external nose and ears  NECK: no obvious masses on inspection  LUNGS: clear to auscultation bilaterally, no wheezes, rales or rhonchi, good air movement  CV:  HRRR, no peripheral edema  MS: moves all extremities without noticeable abnormality, normal inspection, TTP attach L RTC to hum, decreased ROM in abd and ext secondary to pain. NV intact distal.  PSYCH: pleasant and cooperative, no obvious depression or anxiety  ASSESSMENT AND PLAN:  Discussed the following assessment and plan:  Chronic left shoulder pain  Localization-related idiopathic epilepsy and epileptic syndromes with seizures of localized onset, not intractable, without status epilepticus ([place])  Sinus node dysfunction (HCC)  Bilateral deafness  BMI 31.0-31.9,adult  -advised assistant to check with pt on gyn report -advised simple gentle exercises and proper sleep position with follow up with ortho if persists -lifestyle recs -cont care with  neurology for seizure d/o and cardiology for heart -follow up as scheduled for CPE -Patient advised to return or notify a doctor immediately if symptoms worsen or persist or new concerns arise.  Patient Instructions  BEFORE YOU LEAVE: -find out what gyn she saw about the abnormal pap smear and obtain copies. -follow up: CPE as scheduled in April  Move the shoulder as we discussed. Follow up with your specialist if not improving.   We recommend the following healthy lifestyle for LIFE: 1) Small portions. But, make sure to get regular (at least 3 per day), healthy meals and small healthy snacks if needed.  2) Eat a healthy clean diet.   TRY TO EAT: -at least 5-7 servings of low sugar, colorful, and nutrient rich vegetables per day (not corn, potatoes or bananas.) -berries are the best choice if you wish to eat fruit (only eat small amounts if trying to reduce weight)  -lean meets (fish, white meat of chicken or [place]) -vegan proteins for some meals - beans or tofu, whole grains, nuts and seeds -Replace bad fats with good fats - good fats include: fish, nuts and seeds, canola oil, olive oil -small amounts of low fat or non fat dairy -small amounts of100 % whole grains - check the lables -drink plenty of water  AVOID: -SUGAR, sweets, anything with added sugar, corn syrup or sweeteners - must read labels as even foods advertised as "healthy" often are loaded with sugar -if you must have a sweetener, small amounts of stevia may be best -sweetened beverages and artificially sweetened beverages -simple starches (rice, bread, potatoes, pasta, chips, etc - small amounts of 100% whole grains are ok) -red meat, pork, butter -fried foods, fast food, processed food, excessive dairy, eggs and coconut.  3)Get at least 150 minutes of sweaty aerobic exercise per week.  4)Reduce stress - consider counseling, meditation and relaxation to balance other aspects of your life.    Lucretia Kern,  DO

## 2018-03-05 ENCOUNTER — Ambulatory Visit (INDEPENDENT_AMBULATORY_CARE_PROVIDER_SITE_OTHER): Payer: Medicare Other | Admitting: Family Medicine

## 2018-03-05 ENCOUNTER — Encounter: Payer: Self-pay | Admitting: Family Medicine

## 2018-03-05 VITALS — BP 110/80 | HR 62 | Temp 97.7°F | Ht 63.0 in | Wt 179.5 lb

## 2018-03-05 DIAGNOSIS — I495 Sick sinus syndrome: Secondary | ICD-10-CM

## 2018-03-05 DIAGNOSIS — M25512 Pain in left shoulder: Secondary | ICD-10-CM | POA: Diagnosis not present

## 2018-03-05 DIAGNOSIS — G8929 Other chronic pain: Secondary | ICD-10-CM

## 2018-03-05 DIAGNOSIS — G40009 Localization-related (focal) (partial) idiopathic epilepsy and epileptic syndromes with seizures of localized onset, not intractable, without status epilepticus: Secondary | ICD-10-CM

## 2018-03-05 DIAGNOSIS — H9193 Unspecified hearing loss, bilateral: Secondary | ICD-10-CM | POA: Diagnosis not present

## 2018-03-05 DIAGNOSIS — Z6831 Body mass index (BMI) 31.0-31.9, adult: Secondary | ICD-10-CM

## 2018-03-05 NOTE — Patient Instructions (Signed)
BEFORE YOU LEAVE: -find out what gyn she saw about the abnormal pap smear and obtain copies. -follow up: CPE as scheduled in April  Move the shoulder as we discussed. Follow up with your specialist if not improving.   We recommend the following healthy lifestyle for LIFE: 1) Small portions. But, make sure to get regular (at least 3 per day), healthy meals and small healthy snacks if needed.  2) Eat a healthy clean diet.   TRY TO EAT: -at least 5-7 servings of low sugar, colorful, and nutrient rich vegetables per day (not corn, potatoes or bananas.) -berries are the best choice if you wish to eat fruit (only eat small amounts if trying to reduce weight)  -lean meets (fish, white meat of chicken or [place]) -vegan proteins for some meals - beans or tofu, whole grains, nuts and seeds -Replace bad fats with good fats - good fats include: fish, nuts and seeds, canola oil, olive oil -small amounts of low fat or non fat dairy -small amounts of100 % whole grains - check the lables -drink plenty of water  AVOID: -SUGAR, sweets, anything with added sugar, corn syrup or sweeteners - must read labels as even foods advertised as "healthy" often are loaded with sugar -if you must have a sweetener, small amounts of stevia may be best -sweetened beverages and artificially sweetened beverages -simple starches (rice, bread, potatoes, pasta, chips, etc - small amounts of 100% whole grains are ok) -red meat, pork, butter -fried foods, fast food, processed food, excessive dairy, eggs and coconut.  3)Get at least 150 minutes of sweaty aerobic exercise per week.  4)Reduce stress - consider counseling, meditation and relaxation to balance other aspects of your life.

## 2018-04-21 LAB — CUP PACEART REMOTE DEVICE CHECK
Battery Impedance: 392 Ohm
Battery Remaining Longevity: 115 mo
Battery Voltage: 2.78 V
Brady Statistic AP VS Percent: 0 %
Brady Statistic AS VP Percent: 0 %
Date Time Interrogation Session: 20191028164811
Implantable Lead Implant Date: 20120409
Implantable Lead Location: 753860
Implantable Lead Model: 4076
Implantable Lead Model: 4076
Implantable Pulse Generator Implant Date: 20120409
Lead Channel Impedance Value: 648 Ohm
Lead Channel Pacing Threshold Amplitude: 0.75 V
Lead Channel Pacing Threshold Pulse Width: 0.4 ms
Lead Channel Setting Pacing Amplitude: 2 V
Lead Channel Setting Pacing Amplitude: 2.5 V
Lead Channel Setting Pacing Pulse Width: 0.4 ms
MDC IDC LEAD IMPLANT DT: 20120409
MDC IDC LEAD LOCATION: 753859
MDC IDC MSMT LEADCHNL RA IMPEDANCE VALUE: 572 Ohm
MDC IDC MSMT LEADCHNL RV PACING THRESHOLD AMPLITUDE: 0.5 V
MDC IDC MSMT LEADCHNL RV PACING THRESHOLD PULSEWIDTH: 0.4 ms
MDC IDC SET LEADCHNL RV SENSING SENSITIVITY: 5.6 mV
MDC IDC STAT BRADY AP VP PERCENT: 0 %
MDC IDC STAT BRADY AS VS PERCENT: 100 %

## 2018-05-24 NOTE — Progress Notes (Signed)
NEUROLOGY FOLLOW UP OFFICE NOTE  [name] [name] [id number]  HISTORY OF PRESENT ILLNESS: [name] [name] is a 63 year old woman with deafness and pacemaker due to finding of asystole during a tilt table test and bilateral hip replacements, and anxiety who follows up for complex partial seizures and left temporal lobe epilepsy since childhood.  She is accompanied by her mother who supplements history.  She is accompanied by an interpreter.  UPDATE: Current medications: Lamotrigine ER 300 mg twice daily, oxcarbazepine 300 mg in a.m. and 600 mg in p.m.  She hasn't had any recent staring spells or drop attacks.  Once in a while has mild headache manageable with Tylenol  HISTORY: Two types: 1) Staring spells, not fully respond, sometimes aware, tenses up but no convulsions, lasts seconds to a minute.  2) Drop attacks, loses conscious and falls. Sometimes able to hold on to something. She has had recurrence of drop attacks as well as dizzy spells for the past year. It seems to have started following the death of her father last year. The spells were well controlled up until that time. She does endorse depression. She has one spell a month, usually at the end of the month.   Due to recurrence of drop attacks, she had another EEG performed on 06/17/14, which was normal. Due to dizziness, trough levels of her antiepileptic medications were checked to look for toxicity, which were normal. Levels were normal.   Prior medications: She had side effects to Trileptal at 900mg . Other medications tried, include Dilantin (ineffective), and immediate-release lamotrigine (ineffective).  Prior studies: EEG: focal slowing and frequent spike and wave activity in left anterior temporal lobe. MRI Brain (04/13/97): focal area of encephalomalacia in posterior left temporal lobe. MRI Brain (10/19/08): possible left mesial temporal sclerosis and lesion in left posterior temporal lobe.  She does not  drive.  When she was [age] years old, she developed a virus following a case of the chicken pox. 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  PAST MEDICAL HISTORY: Past Medical History:  Diagnosis Date  . Chicken pox   . Conductive hearing loss, childhood onset   . 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 Epilepsy, followed by Dr. Tomma Rakers in neurology 08/26/2012  . Hx of adenomatous colonic polyps 11/19/2016  . Pacemaker   . Pacemaker    2012  . Seizure ([place])   . Seizures ([place])    Petite Mal     MEDICATIONS: Current Outpatient Medications on File Prior to Visit  Medication Sig Dispense Refill  . Ascorbic Acid (VITAMIN C PO) Take 1 tablet by mouth daily.     . Cholecalciferol (VITAMIN D) 2000 UNITS CAPS Take by mouth 2 (two) times daily.    . fish oil-omega-3 fatty acids 1000 MG capsule Take 2 g by mouth 2 (two) times daily.    . folic acid (FOLVITE) 1 MG tablet Take 1 mg by mouth daily.    . LamoTRIgine 300 MG TB24 24 hour tablet Take 1 tablet (300 mg total) by mouth 2 (two) times daily. 180 tablet 1  . magnesium 30 MG tablet Take 30 mg by mouth daily.     . Multiple Vitamin (MULTIVITAMIN WITH MINERALS) TABS tablet Take 1 tablet by mouth daily.    . Oxcarbazepine (TRILEPTAL) 300 MG tablet Take 2 tablets (600 mg total) by mouth 2 (two) times daily. TAKE ONE TABLET BY MOUTH IN THE MORNING  AND TWO IN THE EVENING 120 tablet 0  . Oxcarbazepine (TRILEPTAL) 300 MG tablet TAKE 1 TABLET BY MOUTH IN THE MORNING AND 2 TABLETS IN THE EVENING 270 tablet 2   No current facility-administered medications on file prior to visit.     ALLERGIES: No Known Allergies  FAMILY HISTORY: Family History  Problem Relation Age of Onset  . Diabetes Father   . Hypertension Father   . Colon cancer Father   . Hypertension Mother   . Esophageal cancer Neg Hx   . Rectal cancer Neg Hx   . Stomach cancer Neg Hx    SOCIAL HISTORY: Social History    Socioeconomic History  . Marital status: Divorced    Spouse name: Not on file  . Number of children: Not on file  . Years of education: Not on file  . Highest education level: Not on file  Occupational History  . Not on file  Social Needs  . Financial resource strain: Not on file  . Food insecurity:    Worry: Not on file    Inability: Not on file  . Transportation needs:    Medical: Not on file    Non-medical: Not on file  Tobacco Use  . Smoking status: Never Smoker  . Smokeless tobacco: Never Used  Substance and Sexual Activity  . Alcohol use: No    Alcohol/week: 0.0 standard drinks    Comment: none  . Drug use: No  . Sexual activity: Never  Lifestyle  . Physical activity:    Days per week: Not on file    Minutes per session: Not on file  . Stress: Not on file  Relationships  . Social connections:    Talks on phone: Not on file    Gets together: Not on file    Attends religious service: Not on file    Active member of club or organization: Not on file    Attends meetings of clubs or organizations: Not on file    Relationship status: Not on file  . Intimate partner violence:    Fear of current or ex partner: Not on file    Emotionally abused: Not on file    Physically abused: Not on file    Forced sexual activity: Not on file  Other Topics Concern  . Not on file  Social History Narrative   ** Merged History Encounter **       Work or School: on disability for the deafness, seizure and carpal tunnel      Home Situation: lives with mother and father currently - since 06/2012      Spiritual Beliefs: Christian      Lifestyle: walks twice daily          REVIEW OF SYSTEMS: Constitutional: No fevers, chills, or sweats, no generalized fatigue, change in appetite Eyes: No visual changes, double vision, eye pain Ear, nose and throat: Deaf.  Otherwise, no ear pain, nasal congestion, sore throat Cardiovascular: No chest pain, palpitations Respiratory:  No  shortness of breath at rest or with exertion, wheezes GastrointestinaI: No nausea, vomiting, diarrhea, abdominal pain, fecal incontinence Genitourinary:  No dysuria, urinary retention or frequency Musculoskeletal:  No neck pain, back pain Integumentary: No rash, pruritus, skin lesions Neurological: as above Psychiatric: No depression, insomnia, anxiety Endocrine: No palpitations, fatigue, diaphoresis, mood swings, change in appetite, change in weight, increased thirst Hematologic/Lymphatic:  No purpura, petechiae. Allergic/Immunologic: no itchy/runny eyes, nasal congestion, recent allergic reactions, rashes  PHYSICAL EXAM: Blood pressure 140/78, pulse  69, height 5\' 3"  (1.6 m), weight 177 lb (80.3 kg), last menstrual period 08/29/2005, SpO2 99 %. General: No acute distress.  Patient appears well-groomed.   Head:  Normocephalic/atraumatic Eyes:  Fundi examined but not visualized Neck: supple, no paraspinal tenderness, full range of motion Heart:  Regular rate and rhythm Lungs:  Clear to auscultation bilaterally Back: No paraspinal tenderness Neurological Exam: alert and oriented to person, place, and time. Attention span and concentration intact, recent and remote memory intact, fund of knowledge intact.  Speech fluent and not dysarthric, language intact.  Deaf.  Otherwise, CN II-XII intact. Bulk and tone normal, muscle strength 5/5 throughout.  Sensation to light touch  intact.  Deep tendon reflexes 2+ throughout.  Finger to nose testing intact.  Gait normal, Romberg negative  IMPRESSION: 1. Localization-related epilepsy with complex partial seizures, stable 2.  Drop attacks 3.  Anxiety  PLAN: 1.  Lamotrigine ER 300mg  twice daily and oxcarbazepine 300mg  in AM and 600mg  in PM 2.  Follow up in 9 months.  15 minutes spent face to face with patient, over 50% spent discussing management.  Metta Clines, DO  CC: Colin Benton, DO

## 2018-05-27 ENCOUNTER — Encounter: Payer: Self-pay | Admitting: Neurology

## 2018-05-27 ENCOUNTER — Ambulatory Visit (INDEPENDENT_AMBULATORY_CARE_PROVIDER_SITE_OTHER): Payer: Medicare Other

## 2018-05-27 ENCOUNTER — Ambulatory Visit (INDEPENDENT_AMBULATORY_CARE_PROVIDER_SITE_OTHER): Payer: Medicare Other | Admitting: Neurology

## 2018-05-27 VITALS — BP 140/78 | HR 69 | Ht 63.0 in | Wt 177.0 lb

## 2018-05-27 DIAGNOSIS — R55 Syncope and collapse: Secondary | ICD-10-CM | POA: Diagnosis not present

## 2018-05-27 DIAGNOSIS — F419 Anxiety disorder, unspecified: Secondary | ICD-10-CM | POA: Diagnosis not present

## 2018-05-27 DIAGNOSIS — G40009 Localization-related (focal) (partial) idiopathic epilepsy and epileptic syndromes with seizures of localized onset, not intractable, without status epilepticus: Secondary | ICD-10-CM | POA: Diagnosis not present

## 2018-05-27 DIAGNOSIS — I495 Sick sinus syndrome: Secondary | ICD-10-CM

## 2018-05-27 NOTE — Patient Instructions (Signed)
No change in medication: Lamotrigine ER 300mg  twice daily Oxcarbazepine 300mg  in morning and 600mg  at night  Follow up in 9 months.

## 2018-05-28 DIAGNOSIS — R55 Syncope and collapse: Secondary | ICD-10-CM

## 2018-05-28 LAB — CUP PACEART REMOTE DEVICE CHECK
Battery Remaining Longevity: 112 mo
Brady Statistic AP VP Percent: 0 %
Brady Statistic AS VP Percent: 0 %
Implantable Lead Implant Date: 20120409
Implantable Lead Model: 4076
Implantable Lead Model: 4076
Lead Channel Impedance Value: 590 Ohm
Lead Channel Pacing Threshold Amplitude: 0.5 V
Lead Channel Pacing Threshold Amplitude: 0.625 V
Lead Channel Pacing Threshold Pulse Width: 0.4 ms
Lead Channel Setting Pacing Amplitude: 2 V
MDC IDC LEAD IMPLANT DT: 20120409
MDC IDC LEAD LOCATION: 753859
MDC IDC LEAD LOCATION: 753860
MDC IDC MSMT BATTERY IMPEDANCE: 415 Ohm
MDC IDC MSMT BATTERY VOLTAGE: 2.78 V
MDC IDC MSMT LEADCHNL RV IMPEDANCE VALUE: 678 Ohm
MDC IDC MSMT LEADCHNL RV PACING THRESHOLD PULSEWIDTH: 0.4 ms
MDC IDC PG IMPLANT DT: 20120409
MDC IDC SESS DTM: 20200127193916
MDC IDC SET LEADCHNL RV PACING AMPLITUDE: 2.5 V
MDC IDC SET LEADCHNL RV PACING PULSEWIDTH: 0.4 ms
MDC IDC SET LEADCHNL RV SENSING SENSITIVITY: 5.6 mV
MDC IDC STAT BRADY AP VS PERCENT: 0 %
MDC IDC STAT BRADY AS VS PERCENT: 100 %

## 2018-05-28 NOTE — Progress Notes (Signed)
Remote pacemaker transmission.   

## 2018-06-03 ENCOUNTER — Encounter: Payer: Self-pay | Admitting: Internal Medicine

## 2018-06-03 ENCOUNTER — Ambulatory Visit (INDEPENDENT_AMBULATORY_CARE_PROVIDER_SITE_OTHER): Payer: Medicare Other | Admitting: Internal Medicine

## 2018-06-03 VITALS — BP 146/80 | HR 62 | Ht 63.0 in | Wt 179.2 lb

## 2018-06-03 DIAGNOSIS — R001 Bradycardia, unspecified: Secondary | ICD-10-CM

## 2018-06-03 DIAGNOSIS — Z950 Presence of cardiac pacemaker: Secondary | ICD-10-CM

## 2018-06-03 DIAGNOSIS — I495 Sick sinus syndrome: Secondary | ICD-10-CM | POA: Diagnosis not present

## 2018-06-03 NOTE — Progress Notes (Signed)
Patient Care Team: Lucretia Kern, DO as PCP - General (Family Medicine) Aloha Gell, MD as Consulting Physician (Obstetrics and Gynecology)   HPI  [name] [name] is a 63 y.o. female Seen in followup for  pacemaker implanted  for asystole during tilt table testin in 2012 in [place]  She is deaf.   No recurrent syncope  Some lightheadedness w changes in position  No chest pain or SOB         Past Medical History:  Diagnosis Date  . Chicken pox   . Conductive hearing loss, childhood onset   . 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 Epilepsy, followed by Dr. Tomma Rakers in neurology 08/26/2012  . Hx of adenomatous colonic polyps 11/19/2016  . Pacemaker   . Pacemaker    2012  . Seizure ([place])   . Seizures ([place])    Petite Mal     Past Surgical History:  Procedure Laterality Date  . HIP ARTHROPLASTY    . PACEMAKER INSERTION  07/2010  . TOTAL HIP ARTHROPLASTY  07/2011   right  . TOTAL HIP ARTHROPLASTY  03/2012   left    Current Outpatient Medications  Medication Sig Dispense Refill  . Ascorbic Acid (VITAMIN C PO) Take 1 tablet by mouth daily.     . Cholecalciferol (VITAMIN D) 2000 UNITS CAPS Take by mouth 2 (two) times daily.    . fish oil-omega-3 fatty acids 1000 MG capsule Take 2 g by mouth 2 (two) times daily.    . folic acid (FOLVITE) 1 MG tablet Take 1 mg by mouth daily.    . LamoTRIgine 300 MG TB24 24 hour tablet Take 1 tablet (300 mg total) by mouth 2 (two) times daily. 180 tablet 1  . magnesium 30 MG tablet Take 30 mg by mouth daily.     . Multiple Vitamin (MULTIVITAMIN WITH MINERALS) TABS tablet Take 1 tablet by mouth daily.    . Oxcarbazepine (TRILEPTAL) 300 MG tablet TAKE 1 TABLET BY MOUTH IN THE MORNING AND 2 TABLETS IN THE EVENING 270 tablet 2   No current facility-administered medications for this visit.     No Known Allergies  Review of Systems negative except from HPI and PMH  Physical Exam BP (!) 146/80   Pulse 62   Ht 5\' 3"  (1.6  m)   Wt 179 lb 3.2 oz (81.3 kg)   LMP 08/29/2005   SpO2 99%   BMI 31.74 kg/m  Well developed and nourished in no acute distress HENT normal Neck supple with JVP-flat Clear Device pocket well healed; without hematoma or erythema.  There is no tethering  Regular rate and rhythm, no murmurs or gallops Abd-soft with active BS No Clubbing cyanosis edema Skin-warm and dry A & Oriented  Grossly normal sensory and motor function   ECG  Sinus 62  24/09/41    Assessment and  Plan  Syncope  Sinus node dysfunction  Pacemaker-[employer] The patient's device was interrogated.  The information was reviewed. No changes were made in the programming.    Seizures  Deaf  No syncope

## 2018-06-06 LAB — CUP PACEART INCLINIC DEVICE CHECK
Implantable Lead Implant Date: 20120409
Implantable Lead Implant Date: 20120409
Implantable Lead Location: 753859
Implantable Lead Location: 753860
Implantable Lead Model: 4076
Implantable Lead Model: 4076
Implantable Pulse Generator Implant Date: 20120409
MDC IDC SESS DTM: 20200206092731

## 2018-07-14 ENCOUNTER — Other Ambulatory Visit: Payer: Self-pay | Admitting: Neurology

## 2018-08-22 ENCOUNTER — Encounter: Payer: Medicare Other | Admitting: Family Medicine

## 2018-08-26 ENCOUNTER — Other Ambulatory Visit: Payer: Self-pay

## 2018-08-26 ENCOUNTER — Ambulatory Visit (INDEPENDENT_AMBULATORY_CARE_PROVIDER_SITE_OTHER): Payer: Medicare Other | Admitting: *Deleted

## 2018-08-26 DIAGNOSIS — I495 Sick sinus syndrome: Secondary | ICD-10-CM

## 2018-08-26 DIAGNOSIS — R001 Bradycardia, unspecified: Secondary | ICD-10-CM

## 2018-08-26 LAB — CUP PACEART REMOTE DEVICE CHECK
Battery Impedance: 464 Ohm
Battery Remaining Longevity: 107 mo
Battery Voltage: 2.78 V
Brady Statistic AP VP Percent: 0 %
Brady Statistic AP VS Percent: 0 %
Brady Statistic AS VP Percent: 0 %
Brady Statistic AS VS Percent: 100 %
Date Time Interrogation Session: 20200427130223
Implantable Lead Implant Date: 20120409
Implantable Lead Implant Date: 20120409
Implantable Lead Location: 753859
Implantable Lead Location: 753860
Implantable Lead Model: 4076
Implantable Lead Model: 4076
Implantable Pulse Generator Implant Date: 20120409
Lead Channel Impedance Value: 589 Ohm
Lead Channel Impedance Value: 627 Ohm
Lead Channel Pacing Threshold Amplitude: 0.5 V
Lead Channel Pacing Threshold Amplitude: 0.625 V
Lead Channel Pacing Threshold Pulse Width: 0.4 ms
Lead Channel Pacing Threshold Pulse Width: 0.4 ms
Lead Channel Setting Pacing Amplitude: 2 V
Lead Channel Setting Pacing Amplitude: 2.5 V
Lead Channel Setting Pacing Pulse Width: 0.4 ms
Lead Channel Setting Sensing Sensitivity: 5.6 mV

## 2018-09-02 NOTE — Progress Notes (Signed)
Remote pacemaker transmission.   

## 2018-09-30 ENCOUNTER — Encounter: Payer: Medicare Other | Admitting: Family Medicine

## 2018-10-19 ENCOUNTER — Other Ambulatory Visit: Payer: Self-pay | Admitting: Neurology

## 2018-11-02 ENCOUNTER — Other Ambulatory Visit: Payer: Self-pay | Admitting: Neurology

## 2018-11-04 ENCOUNTER — Telehealth: Payer: Self-pay | Admitting: Neurology

## 2018-11-04 NOTE — Telephone Encounter (Signed)
New Message  Pt c/o medication issue:  1. Name of Medication: oxcarbazepine (trileptal) 300 mg tablet once morning and twice evening  2. How are you currently taking this medication (dosage and times per day)? See above  3. Are you having a reaction (difficulty breathing--STAT)? No  4. What is your medication issue? Medication got denied and they're calling to see why.  Please f/u with pt

## 2018-11-05 NOTE — Telephone Encounter (Signed)
Rx was sent in to [facility]

## 2018-11-25 ENCOUNTER — Ambulatory Visit (INDEPENDENT_AMBULATORY_CARE_PROVIDER_SITE_OTHER): Payer: Medicare Other | Admitting: *Deleted

## 2018-11-25 DIAGNOSIS — I495 Sick sinus syndrome: Secondary | ICD-10-CM

## 2018-11-25 LAB — CUP PACEART REMOTE DEVICE CHECK
Battery Impedance: 513 Ohm
Battery Remaining Longevity: 103 mo
Battery Voltage: 2.78 V
Brady Statistic AP VP Percent: 0 %
Brady Statistic AP VS Percent: 0 %
Brady Statistic AS VP Percent: 0 %
Brady Statistic AS VS Percent: 100 %
Date Time Interrogation Session: 20200727124601
Implantable Lead Implant Date: 20120409
Implantable Lead Implant Date: 20120409
Implantable Lead Location: 753859
Implantable Lead Location: 753860
Implantable Lead Model: 4076
Implantable Lead Model: 4076
Implantable Pulse Generator Implant Date: 20120409
Lead Channel Impedance Value: 598 Ohm
Lead Channel Impedance Value: 686 Ohm
Lead Channel Pacing Threshold Amplitude: 0.5 V
Lead Channel Pacing Threshold Amplitude: 0.625 V
Lead Channel Pacing Threshold Pulse Width: 0.4 ms
Lead Channel Pacing Threshold Pulse Width: 0.4 ms
Lead Channel Setting Pacing Amplitude: 2 V
Lead Channel Setting Pacing Amplitude: 2.5 V
Lead Channel Setting Pacing Pulse Width: 0.4 ms
Lead Channel Setting Sensing Sensitivity: 5.6 mV

## 2018-12-04 DIAGNOSIS — M4692 Unspecified inflammatory spondylopathy, cervical region: Secondary | ICD-10-CM | POA: Diagnosis not present

## 2018-12-04 DIAGNOSIS — M19011 Primary osteoarthritis, right shoulder: Secondary | ICD-10-CM | POA: Diagnosis not present

## 2018-12-06 DIAGNOSIS — M6281 Muscle weakness (generalized): Secondary | ICD-10-CM | POA: Diagnosis not present

## 2018-12-06 DIAGNOSIS — M47892 Other spondylosis, cervical region: Secondary | ICD-10-CM | POA: Diagnosis not present

## 2018-12-10 NOTE — Progress Notes (Signed)
Remote pacemaker transmission.   

## 2018-12-19 DIAGNOSIS — Z1231 Encounter for screening mammogram for malignant neoplasm of breast: Secondary | ICD-10-CM | POA: Diagnosis not present

## 2018-12-19 LAB — HM MAMMOGRAPHY

## 2018-12-25 DIAGNOSIS — M542 Cervicalgia: Secondary | ICD-10-CM | POA: Diagnosis not present

## 2018-12-26 ENCOUNTER — Other Ambulatory Visit: Payer: Self-pay | Admitting: Orthopedic Surgery

## 2018-12-26 ENCOUNTER — Other Ambulatory Visit (HOSPITAL_COMMUNITY): Payer: Self-pay | Admitting: Orthopedic Surgery

## 2019-01-08 ENCOUNTER — Other Ambulatory Visit: Payer: Self-pay

## 2019-01-08 ENCOUNTER — Encounter: Payer: Self-pay | Admitting: Family Medicine

## 2019-01-08 ENCOUNTER — Ambulatory Visit (INDEPENDENT_AMBULATORY_CARE_PROVIDER_SITE_OTHER): Payer: Medicare Other | Admitting: Family Medicine

## 2019-01-08 VITALS — BP 110/80 | HR 67 | Temp 98.7°F | Ht 62.0 in | Wt 175.1 lb

## 2019-01-08 DIAGNOSIS — Z13220 Encounter for screening for lipoid disorders: Secondary | ICD-10-CM

## 2019-01-08 DIAGNOSIS — Z0000 Encounter for general adult medical examination without abnormal findings: Secondary | ICD-10-CM

## 2019-01-08 DIAGNOSIS — Z79899 Other long term (current) drug therapy: Secondary | ICD-10-CM

## 2019-01-08 LAB — CBC WITH DIFFERENTIAL/PLATELET
Basophils Absolute: 0 10*3/uL (ref 0.0–0.1)
Basophils Relative: 0.8 % (ref 0.0–3.0)
Eosinophils Absolute: 0 10*3/uL (ref 0.0–0.7)
Eosinophils Relative: 0.6 % (ref 0.0–5.0)
HCT: 39.9 % (ref 36.0–46.0)
Hemoglobin: 13.2 g/dL (ref 12.0–15.0)
Lymphocytes Relative: 31.3 % (ref 12.0–46.0)
Lymphs Abs: 1.6 10*3/uL (ref 0.7–4.0)
MCHC: 33.1 g/dL (ref 30.0–36.0)
MCV: 94 fl (ref 78.0–100.0)
Monocytes Absolute: 0.5 10*3/uL (ref 0.1–1.0)
Monocytes Relative: 9.5 % (ref 3.0–12.0)
Neutro Abs: 3 10*3/uL (ref 1.4–7.7)
Neutrophils Relative %: 57.8 % (ref 43.0–77.0)
Platelets: 266 10*3/uL (ref 150.0–400.0)
RBC: 4.24 Mil/uL (ref 3.87–5.11)
RDW: 14 % (ref 11.5–15.5)
WBC: 5.2 10*3/uL (ref 4.0–10.5)

## 2019-01-08 LAB — LIPID PANEL
Cholesterol: 213 mg/dL — ABNORMAL HIGH (ref 0–200)
HDL: 67.7 mg/dL (ref >39.00)
LDL Cholesterol: 137 mg/dL — ABNORMAL HIGH (ref 0–99)
NonHDL: 145.69
Total CHOL/HDL Ratio: 3
Triglycerides: 43 mg/dL (ref 0.0–149.0)
VLDL: 8.6 mg/dL (ref 0.0–40.0)

## 2019-01-08 LAB — COMPREHENSIVE METABOLIC PANEL
ALT: 15 U/L (ref 0–35)
AST: 21 U/L (ref 0–37)
Albumin: 4.4 g/dL (ref 3.5–5.2)
Alkaline Phosphatase: 84 U/L (ref 39–117)
BUN: 9 mg/dL (ref 6–23)
CO2: 29 mEq/L (ref 19–32)
Calcium: 9.5 mg/dL (ref 8.4–10.5)
Chloride: 93 mEq/L — ABNORMAL LOW (ref 96–112)
Creatinine, Ser: 0.82 mg/dL (ref 0.40–1.20)
GFR: 70.47 mL/min (ref >60.00)
Glucose, Bld: 76 mg/dL (ref 70–99)
Potassium: 4.4 mEq/L (ref 3.5–5.1)
Sodium: 129 mEq/L — ABNORMAL LOW (ref 135–145)
Total Bilirubin: 0.5 mg/dL (ref 0.2–1.2)
Total Protein: 7.4 g/dL (ref 6.0–8.3)

## 2019-01-08 NOTE — Progress Notes (Signed)
[name] [name] DOB: 06/07/1955 Encounter date: 01/08/2019  This is a 63 y.o. female who presents for complete physical   History of present illness/Additional concerns: Yesterday something popped up and got little dizzy spell, heart rate went up, felt flushed. Has had this before, but not frequent; not monthly. Was just watching tv, just sitting when it happened.    Follows with Dr. Pamala Hurry for gyn needs. Has appointment in 2 weeks time.  Does pacemaker checks at home. Follows with cardiology.  Follows with Dr. [name] for seizures. Doesn't remember last seizure.   Last colonoscopy was 10/2016 by Dr. Carlean Purl and 5 year follow up suggested for polyps.   Past Medical History:  Diagnosis Date  . Chicken pox   . Conductive hearing loss, childhood onset   . 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 Epilepsy, followed by Dr. Tomma Rakers in neurology 08/26/2012  . Hx of adenomatous colonic polyps 11/19/2016  . Pacemaker   . Pacemaker    2012  . Seizure ([place])   . Seizures ([place])    [place]    Past Surgical History:  Procedure Laterality Date  . HIP ARTHROPLASTY    . PACEMAKER INSERTION  07/2010  . TOTAL HIP ARTHROPLASTY  07/2011   right  . TOTAL HIP ARTHROPLASTY  03/2012   left   No Known Allergies Current Meds  Medication Sig  . Ascorbic Acid (VITAMIN C PO) Take 1 tablet by mouth daily.   . Cholecalciferol (VITAMIN D) 2000 UNITS CAPS Take by mouth 2 (two) times daily.  . fish oil-omega-3 fatty acids 1000 MG capsule Take 2 g by mouth 2 (two) times daily.  . folic acid (FOLVITE) 1 MG tablet Take 1 mg by mouth daily.  . LamoTRIgine 300 MG TB24 24 hour tablet Take 1 tablet by mouth twice daily  . magnesium 30 MG tablet Take 30 mg by mouth daily.   . Multiple Vitamin (MULTIVITAMIN WITH MINERALS) TABS tablet Take 1 tablet by mouth daily.  . Oxcarbazepine (TRILEPTAL) 300 MG tablet TAKE 1 TABLET BY MOUTH IN THE MORNING AND 2 IN THE EVENING   Social History   Tobacco Use  .  Smoking status: Never Smoker  . Smokeless tobacco: Never Used  Substance Use Topics  . Alcohol use: No    Alcohol/week: 0.0 standard drinks    Comment: none   Family History  Problem Relation Age of Onset  . Diabetes Father   . Hypertension Father   . Colon cancer Father 14  . Hypertension Mother   . Healthy Brother   . Healthy Brother   . Esophageal cancer Neg Hx   . Rectal cancer Neg Hx   . Stomach cancer Neg Hx      Review of Systems  Constitutional: Negative for activity change, appetite change, chills, fatigue, fever and unexpected weight change.  HENT: Negative for congestion, ear pain, hearing loss, sinus pressure, sinus pain, sore throat and trouble swallowing.   Eyes: Negative for pain and visual disturbance.  Respiratory: Negative for cough, chest tightness, shortness of breath and wheezing.   Cardiovascular: Negative for chest pain, palpitations and leg swelling.  Gastrointestinal: Negative for abdominal pain, blood in stool, constipation, diarrhea, nausea and vomiting.  Genitourinary: Negative for difficulty urinating and menstrual problem.  Musculoskeletal: Negative for arthralgias and back pain.  Skin: Negative for rash.  Neurological: Negative for dizziness, weakness, numbness and headaches.  Hematological: Negative for adenopathy. Does not bruise/bleed easily.  Psychiatric/Behavioral: Negative  for sleep disturbance and suicidal ideas. The patient is not nervous/anxious.     CBC:  Lab Results  Component Value Date   WBC 5.2 01/08/2019   HGB 13.2 01/08/2019   HCT 39.9 01/08/2019   MCH 29.5 04/29/2014   MCHC 33.1 01/08/2019   RDW 14.0 01/08/2019   PLT 266.0 01/08/2019   CMP: Lab Results  Component Value Date   NA 129 (L) 01/08/2019   K 4.4 01/08/2019   CL 93 (L) 01/08/2019   CO2 29 01/08/2019   ANIONGAP 15 03/29/2014   GLUCOSE 76 01/08/2019   BUN 9 01/08/2019   CREATININE 0.82 01/08/2019   CREATININE 0.77 05/15/2017   GFRAA >90 03/29/2014    GFRAA 82 09/12/2013   CALCIUM 9.5 01/08/2019   PROT 7.4 01/08/2019   BILITOT 0.5 01/08/2019   ALKPHOS 84 01/08/2019   ALT 15 01/08/2019   AST 21 01/08/2019   LIPID: Lab Results  Component Value Date   CHOL 213 (H) 01/08/2019   TRIG 43.0 01/08/2019   HDL 67.70 01/08/2019   LDLCALC 137 (H) 01/08/2019    Objective:  BP 110/80 (BP Location: Left Arm, Patient Position: Sitting, Cuff Size: Large)   Pulse 67   Temp 98.7 F (37.1 C) (Temporal)   Ht 5\' 2"  (1.575 m)   Wt 175 lb 1.6 oz (79.4 kg)   LMP 08/29/2005   SpO2 98%   BMI 32.03 kg/m   Weight: 175 lb 1.6 oz (79.4 kg)   BP Readings from Last 3 Encounters:  01/08/19 110/80  06/03/18 (!) 146/80  05/27/18 140/78   Wt Readings from Last 3 Encounters:  01/08/19 175 lb 1.6 oz (79.4 kg)  06/03/18 179 lb 3.2 oz (81.3 kg)  05/27/18 177 lb (80.3 kg)    Physical Exam Constitutional:      General: She is not in acute distress.    Appearance: She is well-developed.  HENT:     Head: Normocephalic and atraumatic.     Right Ear: External ear normal.     Left Ear: External ear normal.     Mouth/Throat:     Pharynx: No oropharyngeal exudate.  Eyes:     Conjunctiva/sclera: Conjunctivae normal.     Pupils: Pupils are equal, round, and reactive to light.  Neck:     Musculoskeletal: Normal range of motion and neck supple.     Thyroid: No thyromegaly.  Cardiovascular:     Rate and Rhythm: Normal rate and regular rhythm.     Heart sounds: Normal heart sounds. No murmur. No friction rub. No gallop.   Pulmonary:     Effort: Pulmonary effort is normal.     Breath sounds: Normal breath sounds.  Abdominal:     General: Bowel sounds are normal. There is no distension.     Palpations: Abdomen is soft. There is no mass.     Tenderness: There is no abdominal tenderness. There is no guarding.     Hernia: No hernia is present.  Musculoskeletal: Normal range of motion.        General: No tenderness or deformity.  Lymphadenopathy:      Cervical: No cervical adenopathy.  Skin:    General: Skin is warm and dry.     Findings: No rash.     Comments: No skin abnormalities noted.  Neurological:     Mental Status: She is alert and oriented to person, place, and time.     Deep Tendon Reflexes: Reflexes normal.     Reflex Scores:  Tricep reflexes are 2+ on the right side and 2+ on the left side.      Bicep reflexes are 2+ on the right side and 2+ on the left side.      Brachioradialis reflexes are 2+ on the right side and 2+ on the left side.      Patellar reflexes are 2+ on the right side and 2+ on the left side. Psychiatric:        Speech: Speech normal.        Behavior: Behavior normal.        Thought Content: Thought content normal.     Assessment/Plan: There are no preventive care reminders to display for this patient. Health Maintenance reviewed.  1. Preventative health care She is up-to-date with mammogram and colonoscopy.  We briefly discussed shingles vaccine today and literature was given to her to review and consider immunization.  2. High risk medication use - CBC with Differential/Platelet; Future - Comprehensive metabolic panel; Future - Comprehensive metabolic panel - CBC with Differential/Platelet  3. Lipid screening - Lipid panel; Future - Lipid panel  Return in about 6 months (around 07/08/2019) for Chronic condition visit.  Micheline Rough, MD

## 2019-01-08 NOTE — Patient Instructions (Signed)
Shingles  Shingles, which is also known as herpes zoster, is an infection that causes a painful skin rash and fluid-filled blisters. It is caused by a virus. Shingles only develops in people who:  Have had chickenpox.  Have been given a medicine to protect against chickenpox (have been vaccinated). Shingles is rare in this group. What are the causes? Shingles is caused by varicella-zoster virus (VZV). This is the same virus that causes chickenpox. After a person is exposed to VZV, the virus stays in the body in an inactive (dormant) state. Shingles develops if the virus is reactivated. This can happen many years after the first (initial) exposure to VZV. It is not known what causes this virus to be reactivated. What increases the risk? People who have had chickenpox or received the chickenpox vaccine are at risk for shingles. Shingles infection is more common in people who:  Are older than age 20.  Have a weakened disease-fighting system (immune system), such as people with: ? HIV. ? AIDS. ? Cancer.  Are taking medicines that weaken the immune system, such as transplant medicines.  Are experiencing a lot of stress. What are the signs or symptoms? Early symptoms of this condition include itching, tingling, and pain in an area on your skin. Pain may be described as burning, stabbing, or throbbing. A few days or weeks after early symptoms start, a painful red rash appears. The rash is usually on one side of the body and has a band-like or belt-like pattern. The rash eventually turns into fluid-filled blisters that break open, change into scabs, and dry up in about 2-3 weeks. At any time during the infection, you may also develop:  A fever.  Chills.  A headache.  An upset stomach. How is this diagnosed? This condition is diagnosed with a skin exam. Skin or fluid samples may be taken from the blisters before a diagnosis is made. These samples are examined under a microscope or sent to  a lab for testing. How is this treated? The rash may last for several weeks. There is not a specific cure for this condition. Your health care provider will probably prescribe medicines to help you manage pain, recover more quickly, and avoid long-term problems. Medicines may include:  Antiviral drugs.  Anti-inflammatory drugs.  Pain medicines.  Anti-itching medicines (antihistamines). If the area involved is on your face, you may be referred to a specialist, such as an eye doctor (ophthalmologist) or an ear, nose, and throat (ENT) doctor (otolaryngologist) to help you avoid eye problems, chronic pain, or disability. Follow these instructions at home: Medicines  Take over-the-counter and prescription medicines only as told by your health care provider.  Apply an anti-itch cream or numbing cream to the affected area as told by your health care provider. Relieving itching and discomfort   Apply cold, wet cloths (cold compresses) to the area of the rash or blisters as told by your health care provider.  Cool baths can be soothing. Try adding baking soda or dry oatmeal to the water to reduce itching. Do not bathe in hot water. Blister and rash care  Keep your rash covered with a loose bandage (dressing). Wear loose-fitting clothing to help ease the pain of material rubbing against the rash.  Keep your rash and blisters clean by washing the area with mild soap and cool water as told by your health care provider.  Check your rash every day for signs of infection. Check for: ? More redness, swelling, or pain. ? Fluid  or blood. ? Warmth. ? Pus or a bad smell.  Do not scratch your rash or pick at your blisters. To help avoid scratching: ? Keep your fingernails clean and cut short. ? Wear gloves or mittens while you sleep, if scratching is a problem. General instructions  Rest as told by your health care provider.  Keep all follow-up visits as told by your health care provider. This  is important.  Wash your hands often with soap and water. If soap and water are not available, use hand sanitizer. Doing this lowers your chance of getting a bacterial skin infection.  Before your blisters change into scabs, your shingles infection can cause chickenpox in people who have never had it or have never been vaccinated against it. To prevent this from happening, avoid contact with other people, especially: ? Babies. ? Pregnant women. ? Children who have eczema. ? Elderly people who have transplants. ? People who have chronic illnesses, such as cancer or AIDS. Contact a health care provider if:  Your pain is not relieved with prescribed medicines.  Your pain does not get better after the rash heals.  You have signs of infection in the rash area, such as: ? More redness, swelling, or pain around the rash. ? Fluid or blood coming from the rash. ? The rash area feeling warm to the touch. ? Pus or a bad smell coming from the rash. Get help right away if:  The rash is on your face or nose.  You have facial pain, pain around your eye area, or loss of feeling on one side of your face.  You have difficulty seeing.  You have ear pain or have ringing in your ear.  You have a loss of taste.  Your condition gets worse. Summary  Shingles, which is also known as herpes zoster, is an infection that causes a painful skin rash and fluid-filled blisters.  This condition is diagnosed with a skin exam. Skin or fluid samples may be taken from the blisters and examined before the diagnosis is made.  Keep your rash covered with a loose bandage (dressing). Wear loose-fitting clothing to help ease the pain of material rubbing against the rash.  Before your blisters change into scabs, your shingles infection can cause chickenpox in people who have never had it or have never been vaccinated against it. This information is not intended to replace advice given to you by your health care  provider. Make sure you discuss any questions you have with your health care provider. Document Released: 04/17/2005 Document Revised: 08/09/2018 Document Reviewed: 12/20/2016 Elsevier Patient Education  [place]. Recombinant Zoster (Shingles) Vaccine: What You Need to Know 1. Why get vaccinated? Recombinant zoster (shingles) vaccine can prevent shingles. Shingles (also called herpes zoster, or just zoster) is a painful skin rash, usually with blisters. In addition to the rash, shingles can cause fever, headache, chills, or upset stomach. More rarely, shingles can lead to pneumonia, hearing problems, blindness, brain inflammation (encephalitis), or death. The most common complication of shingles is long-term nerve pain called postherpetic neuralgia (PHN). PHN occurs in the areas where the shingles rash was, even after the rash clears up. It can last for months or years after the rash goes away. The pain from PHN can be severe and debilitating. About 10 to 18% of people who get shingles will experience PHN. The risk of PHN increases with age. An older adult with shingles is more likely to develop PHN and have longer lasting and  more severe pain than a younger person with shingles. Shingles is caused by the varicella zoster virus, the same virus that causes chickenpox. After you have chickenpox, the virus stays in your body and can cause shingles later in life. Shingles cannot be passed from one person to another, but the virus that causes shingles can spread and cause chickenpox in someone who had never had chickenpox or received chickenpox vaccine. 2. Recombinant shingles vaccine Recombinant shingles vaccine provides strong protection against shingles. By preventing shingles, recombinant shingles vaccine also protects against PHN. Recombinant shingles vaccine is the preferred vaccine for the prevention of shingles. However, a different vaccine, live shingles vaccine, may be used in some  circumstances. The recombinant shingles vaccine is recommended for adults 50 years and older without serious immune problems. It is given as a two-dose series. This vaccine is also recommended for people who have already gotten another type of shingles vaccine, the live shingles vaccine. There is no live virus in this vaccine. Shingles vaccine may be given at the same time as other vaccines. 3. Talk with your health care provider Tell your vaccine provider if the person getting the vaccine:  Has had an allergic reaction after a previous dose of recombinant shingles vaccine, or has any severe, life-threatening allergies.  Is pregnant or breastfeeding.  Is currently experiencing an episode of shingles. In some cases, your health care provider may decide to postpone shingles vaccination to a future visit. People with minor illnesses, such as a cold, may be vaccinated. People who are moderately or severely ill should usually wait until they recover before getting recombinant shingles vaccine. Your health care provider can give you more information. 4. Risks of a vaccine reaction  A sore arm with mild or moderate pain is very common after recombinant shingles vaccine, affecting about 80% of vaccinated people. Redness and swelling can also happen at the site of the injection.  Tiredness, muscle pain, headache, shivering, fever, stomach pain, and nausea happen after vaccination in more than half of people who receive recombinant shingles vaccine. In clinical trials, about 1 out of 6 people who got recombinant zoster vaccine experienced side effects that prevented them from doing regular activities. Symptoms usually went away on their own in 2 to 3 days. You should still get the second dose of recombinant zoster vaccine even if you had one of these reactions after the first dose. People sometimes faint after medical procedures, including vaccination. Tell your provider if you feel dizzy or have vision  changes or ringing in the ears. As with any medicine, there is a very remote chance of a vaccine causing a severe allergic reaction, other serious injury, or death. 5. What if there is a serious problem? An allergic reaction could occur after the vaccinated person leaves the clinic. If you see signs of a severe allergic reaction (hives, swelling of the face and throat, difficulty breathing, a fast heartbeat, dizziness, or weakness), call 9-1-1 and get the person to the nearest hospital. For other signs that concern you, call your health care provider. Adverse reactions should be reported to the Vaccine Adverse Event Reporting System (VAERS). Your health care provider will usually file this report, or you can do it yourself. Visit the VAERS website at www.vaers.SamedayNews.es or call 712-823-5874. VAERS is only for reporting reactions, and VAERS staff do not give medical advice. 6. How can I learn more?  Ask your health care provider.  Call your local or state health department.  Contact the Centers for  Disease Control and Prevention (CDC): ? Call 805-337-5442 (1-800-CDC-INFO) or ? Visit CDC's website at http://hunter.com/ Vaccine Information Statement Recombinant Zoster Vaccine (02/27/2018) This information is not intended to replace advice given to you by your health care provider. Make sure you discuss any questions you have with your health care provider. Document Released: 06/27/2016 Document Revised: 08/06/2018 Document Reviewed: 11/21/2017 Elsevier Patient Education  2020 [employer].

## 2019-01-13 ENCOUNTER — Telehealth: Payer: Self-pay

## 2019-01-13 NOTE — Telephone Encounter (Signed)
-----   Message from Elliot Cousin, RN sent at 01/13/2019  4:34 PM EDT ----- Result note read to patient's mother; verbalizes understanding. States pt has appt with Dr. [name] Oct. 26  2020 and will draw labs at that appt.  Questioning if still needs F/U labs in 3 months. Also would like copy of results mailed to her home address. Address in demographics verified.   Please advise

## 2019-01-13 NOTE — Telephone Encounter (Signed)
[facility] should be sending all medical records requests to [facility] Dept, not to clinic.

## 2019-01-20 DIAGNOSIS — N72 Inflammatory disease of cervix uteri: Secondary | ICD-10-CM | POA: Diagnosis not present

## 2019-02-01 ENCOUNTER — Other Ambulatory Visit: Payer: Self-pay | Admitting: Neurology

## 2019-02-03 DIAGNOSIS — N72 Inflammatory disease of cervix uteri: Secondary | ICD-10-CM | POA: Diagnosis not present

## 2019-02-03 DIAGNOSIS — R87610 Atypical squamous cells of undetermined significance on cytologic smear of cervix (ASC-US): Secondary | ICD-10-CM | POA: Diagnosis not present

## 2019-02-05 ENCOUNTER — Other Ambulatory Visit: Payer: Self-pay | Admitting: *Deleted

## 2019-02-05 NOTE — Telephone Encounter (Signed)
Received printed copy of refill request for oxcarbazepine. Next appt is 10/26 with Dr. [name].   Dr. [name] looks like you started ordering this 10/3 as it is pended ?   Just wanted to check and make sure fine to reorder.

## 2019-02-19 NOTE — Progress Notes (Signed)
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  She is accompanied by her mother who supplements history.  She is accompanied by an interpreter.  UPDATE: Current medications: Lamotrigine ER 300 mg twice daily, oxcarbazepine 300 mg in a.m. and 600 mg in p.m.  Once in awhile, she may have a brief dizzy spell associated with dizziness.  No loss of awareness. It is brief, just a few seconds.  She had a drop attack in July.  Otherwise, no other spells.   01/08/2019 LABS:  CBC with  WBC 5.2, HGB 13.2, HCT 39.9, PLT 266; CMP with Na 129, K 4.4, Cl 93, CO2 29, glucose 76, BUN 9, Cr 0.82, t bili 0.5, ALP 84, AST 21, ALT 15.  HISTORY: Two types: 1) Staring spells, not fully respond, sometimes aware, tenses up but no convulsions, lasts seconds to a minute.  2) Drop attacks, loses conscious and falls.Sometimes able to hold on to something.She has had recurrence of drop attacks as well as dizzy spells for the past year.It seems to have started following the death of her father last year.The spells were well controlled up until that time.She does endorse depression.She has one spell a month, usually at the end of the month.  Due to recurrence of drop attacks, she had another EEG performed on 06/17/14, which was normal.Due to dizziness, trough levels of her antiepileptic medications were checked to look for toxicity, which were normal.Levels were normal.  Prior medications:She had side effects to Trileptal at 900mg .Other medications tried, include Dilantin (ineffective), and immediate-release lamotrigine (ineffective).  Prior studies: EEG: focal slowing and frequent spike and wave activity in  left anterior temporal lobe. MRI Brain (04/13/97): focal area of encephalomalacia in posterior left temporal lobe. MRI Brain (10/19/08): possible left mesial temporal sclerosis and lesion in left posterior temporal lobe.  She does not drive.  When she was [age] years old, she developed a virus following a case of the chicken pox. 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  PAST MEDICAL HISTORY: Past Medical History:  Diagnosis Date  . Chicken pox   . Conductive hearing loss, childhood onset   . 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 Epilepsy, followed by Dr. Tomma Rakers in neurology 08/26/2012  . Hx of adenomatous colonic polyps 11/19/2016  . Pacemaker   . Pacemaker    2012  . Seizure ([place])   . Seizures ([place])    [place]     MEDICATIONS: Current Outpatient Medications on File Prior to Visit  Medication Sig Dispense Refill  . Ascorbic Acid (VITAMIN C PO) Take 1 tablet by mouth daily.     . Cholecalciferol (VITAMIN D) 2000 UNITS CAPS Take by mouth 2 (two) times daily.    . fish oil-omega-3 fatty acids 1000 MG capsule Take 2 g by mouth 2 (two) times daily.    . folic acid (FOLVITE) 1 MG tablet Take 1 mg by mouth daily.    . LamoTRIgine 300 MG TB24 24 hour tablet Take 1 tablet by mouth twice daily 180 tablet 1  . magnesium 30 MG tablet Take 30 mg by mouth daily.     . Multiple  Vitamin (MULTIVITAMIN WITH MINERALS) TABS tablet Take 1 tablet by mouth daily.    . Oxcarbazepine (TRILEPTAL) 300 MG tablet TAKE 1 TABLET BY MOUTH ONCE DAILY IN THE MORNING AND 2 ONCE DAILY IN THE EVENING 270 tablet 1   No current facility-administered medications on file prior to visit.     ALLERGIES: No Known Allergies  FAMILY HISTORY: Family History  Problem Relation Age of Onset  . Diabetes Father   . Hypertension Father   . Colon cancer Father 53  . Hypertension Mother   . Healthy Brother   . Healthy Brother   . Esophageal cancer Neg Hx   .  Rectal cancer Neg Hx   . Stomach cancer Neg Hx     SOCIAL HISTORY: Social History   Socioeconomic History  . Marital status: Divorced    Spouse name: Not on file  . Number of children: Not on file  . Years of education: Not on file  . Highest education level: Not on file  Occupational History  . Not on file  Social Needs  . Financial resource strain: Not on file  . Food insecurity    Worry: Not on file    Inability: Not on file  . Transportation needs    Medical: Not on file    Non-medical: Not on file  Tobacco Use  . Smoking status: Never Smoker  . Smokeless tobacco: Never Used  Substance and Sexual Activity  . Alcohol use: No    Alcohol/week: 0.0 standard drinks    Comment: none  . Drug use: No  . Sexual activity: Never  Lifestyle  . Physical activity    Days per week: Not on file    Minutes per session: Not on file  . Stress: Not on file  Relationships  . Social Herbalist on phone: Not on file    Gets together: Not on file    Attends religious service: Not on file    Active member of club or organization: Not on file    Attends meetings of clubs or organizations: Not on file    Relationship status: Not on file  . Intimate partner violence    Fear of current or ex partner: Not on file    Emotionally abused: Not on file    Physically abused: Not on file    Forced sexual activity: Not on file  Other Topics Concern  . Not on file  Social History Narrative   ** Merged History Encounter **       Work or School: on disability for the deafness, seizure and carpal tunnel      Home Situation: lives with mother and father currently - since 06/2012      Spiritual Beliefs: Christian      Lifestyle: walks twice daily          REVIEW OF SYSTEMS: Constitutional: No fevers, chills, or sweats, no generalized fatigue, change in appetite Eyes: No visual changes, double vision, eye pain Ear, nose and throat: No hearing loss, ear pain, nasal congestion,  sore throat Cardiovascular: No chest pain, palpitations Respiratory:  No shortness of breath at rest or with exertion, wheezes GastrointestinaI: No nausea, vomiting, diarrhea, abdominal pain, fecal incontinence Genitourinary:  No dysuria, urinary retention or frequency Musculoskeletal:  No neck pain, back pain Integumentary: No rash, pruritus, skin lesions Neurological: as above Psychiatric: No depression, insomnia, anxiety Endocrine: No palpitations, fatigue, diaphoresis, mood swings, change in appetite, change in weight, increased thirst Hematologic/Lymphatic:  No  purpura, petechiae. Allergic/Immunologic: no itchy/runny eyes, nasal congestion, recent allergic reactions, rashes  PHYSICAL EXAM: Blood pressure 137/71, pulse 68, height 5\' 3"  (1.6 m), weight 179 lb 3.2 oz (81.3 kg), last menstrual period 08/29/2005, SpO2 100 %. General: No acute distress.  Patient appears well-groomed.   Head:  Normocephalic/atraumatic Eyes:  Fundi examined but not visualized Neck: supple, no paraspinal tenderness, full range of motion Heart:  Regular rate and rhythm Lungs:  Clear to auscultation bilaterally Back: No paraspinal tenderness Neurological Exam: alert and oriented to person, place, and time. Attention span and concentration intact, recent and remote memory intact, fund of knowledge intact.  Speech fluent and not dysarthric, language intact.  Deaf bilaterally.  Otherwise, CN II-XII intact. Bulk and tone normal, muscle strength 5/5 throughout.  Sensation to light touch  intact.  Deep tendon reflexes 2+ throughout.  Finger to nose testing intact.  Gait normal, Romberg negative.  IMPRESSION: 1.  Localization-related epilepsy with complex partial seizures 2.  Drop attacks 3.  Anxiety 4.  Hyponatremia, secondary to oxcarbazepine.  Asymptomatic and stable.  PLAN: 1.  Lamotrigine ER 300mg  twice daily and oxcarbazepine 300mg  in AM and 600mg  in PM 2.  Follow up in one year or as needed.  15 minutes  spent face to face with patient, over 50% spent discussing management.   Metta Clines, DO  CC: Micheline Rough, MD

## 2019-02-24 ENCOUNTER — Other Ambulatory Visit: Payer: Self-pay

## 2019-02-24 ENCOUNTER — Ambulatory Visit: Payer: Medicare Other | Admitting: Neurology

## 2019-02-24 ENCOUNTER — Encounter: Payer: Self-pay | Admitting: Neurology

## 2019-02-24 VITALS — BP 137/71 | HR 68 | Ht 63.0 in | Wt 179.2 lb

## 2019-02-24 DIAGNOSIS — G40009 Localization-related (focal) (partial) idiopathic epilepsy and epileptic syndromes with seizures of localized onset, not intractable, without status epilepticus: Secondary | ICD-10-CM | POA: Diagnosis not present

## 2019-02-24 DIAGNOSIS — E871 Hypo-osmolality and hyponatremia: Secondary | ICD-10-CM

## 2019-02-24 DIAGNOSIS — R55 Syncope and collapse: Secondary | ICD-10-CM

## 2019-02-24 DIAGNOSIS — F419 Anxiety disorder, unspecified: Secondary | ICD-10-CM

## 2019-02-24 NOTE — Patient Instructions (Addendum)
1.  Continue oxarbazepine 300mg  in morning and 600mg  at night and lamotrigine ER 300mg  twice daily 2.  Follow up in one year or as needed

## 2019-02-25 ENCOUNTER — Ambulatory Visit (INDEPENDENT_AMBULATORY_CARE_PROVIDER_SITE_OTHER): Payer: Medicare Other | Admitting: *Deleted

## 2019-02-25 DIAGNOSIS — I495 Sick sinus syndrome: Secondary | ICD-10-CM | POA: Diagnosis not present

## 2019-02-25 LAB — CUP PACEART REMOTE DEVICE CHECK
Battery Impedance: 562 Ohm
Battery Remaining Longevity: 99 mo
Battery Voltage: 2.78 V
Brady Statistic AP VP Percent: 0 %
Brady Statistic AP VS Percent: 0 %
Brady Statistic AS VP Percent: 0 %
Brady Statistic AS VS Percent: 100 %
Date Time Interrogation Session: 20201027140807
Implantable Lead Implant Date: 20120409
Implantable Lead Implant Date: 20120409
Implantable Lead Location: 753859
Implantable Lead Location: 753860
Implantable Lead Model: 4076
Implantable Lead Model: 4076
Implantable Pulse Generator Implant Date: 20120409
Lead Channel Impedance Value: 609 Ohm
Lead Channel Impedance Value: 669 Ohm
Lead Channel Pacing Threshold Amplitude: 0.5 V
Lead Channel Pacing Threshold Amplitude: 0.625 V
Lead Channel Pacing Threshold Pulse Width: 0.4 ms
Lead Channel Pacing Threshold Pulse Width: 0.4 ms
Lead Channel Setting Pacing Amplitude: 2 V
Lead Channel Setting Pacing Amplitude: 2.5 V
Lead Channel Setting Pacing Pulse Width: 0.4 ms
Lead Channel Setting Sensing Sensitivity: 5.6 mV

## 2019-02-27 ENCOUNTER — Encounter: Payer: Self-pay | Admitting: Family Medicine

## 2019-03-17 NOTE — Progress Notes (Signed)
Remote pacemaker transmission.   

## 2019-03-25 ENCOUNTER — Encounter: Payer: Self-pay | Admitting: Family Medicine

## 2019-04-01 ENCOUNTER — Other Ambulatory Visit: Payer: Self-pay

## 2019-04-02 ENCOUNTER — Ambulatory Visit (INDEPENDENT_AMBULATORY_CARE_PROVIDER_SITE_OTHER): Payer: Medicare Other

## 2019-04-02 DIAGNOSIS — Z23 Encounter for immunization: Secondary | ICD-10-CM

## 2019-04-02 NOTE — Progress Notes (Signed)
After obtaining consent, and per orders of Dr. Ethlyn Gallery, injection of Shingrix given by Melburn Hake Cox. Patient instructed to remain in clinic for 20 minutes afterwards, and to report any adverse reaction to me immediately.

## 2019-04-21 ENCOUNTER — Other Ambulatory Visit: Payer: Self-pay | Admitting: Neurology

## 2019-05-27 ENCOUNTER — Ambulatory Visit (INDEPENDENT_AMBULATORY_CARE_PROVIDER_SITE_OTHER): Payer: Medicare Other | Admitting: *Deleted

## 2019-05-27 DIAGNOSIS — I495 Sick sinus syndrome: Secondary | ICD-10-CM

## 2019-05-27 LAB — CUP PACEART REMOTE DEVICE CHECK
Battery Impedance: 635 Ohm
Battery Remaining Longevity: 94 mo
Battery Voltage: 2.77 V
Brady Statistic AP VP Percent: 0 %
Brady Statistic AP VS Percent: 0 %
Brady Statistic AS VP Percent: 0 %
Brady Statistic AS VS Percent: 100 %
Date Time Interrogation Session: 20210126132405
Implantable Lead Implant Date: 20120409
Implantable Lead Implant Date: 20120409
Implantable Lead Location: 753859
Implantable Lead Location: 753860
Implantable Lead Model: 4076
Implantable Lead Model: 4076
Implantable Pulse Generator Implant Date: 20120409
Lead Channel Impedance Value: 556 Ohm
Lead Channel Impedance Value: 665 Ohm
Lead Channel Pacing Threshold Amplitude: 0.5 V
Lead Channel Pacing Threshold Amplitude: 0.625 V
Lead Channel Pacing Threshold Pulse Width: 0.4 ms
Lead Channel Pacing Threshold Pulse Width: 0.4 ms
Lead Channel Setting Pacing Amplitude: 2 V
Lead Channel Setting Pacing Amplitude: 2.5 V
Lead Channel Setting Pacing Pulse Width: 0.4 ms
Lead Channel Setting Sensing Sensitivity: 5.6 mV

## 2019-07-09 ENCOUNTER — Ambulatory Visit (INDEPENDENT_AMBULATORY_CARE_PROVIDER_SITE_OTHER): Payer: Medicare Other | Admitting: Family Medicine

## 2019-07-09 ENCOUNTER — Other Ambulatory Visit: Payer: Self-pay

## 2019-07-09 ENCOUNTER — Encounter: Payer: Self-pay | Admitting: Family Medicine

## 2019-07-09 VITALS — BP 130/80 | HR 73 | Temp 98.0°F | Ht 63.0 in | Wt 173.9 lb

## 2019-07-09 DIAGNOSIS — E559 Vitamin D deficiency, unspecified: Secondary | ICD-10-CM | POA: Diagnosis not present

## 2019-07-09 DIAGNOSIS — E785 Hyperlipidemia, unspecified: Secondary | ICD-10-CM | POA: Diagnosis not present

## 2019-07-09 DIAGNOSIS — E871 Hypo-osmolality and hyponatremia: Secondary | ICD-10-CM | POA: Diagnosis not present

## 2019-07-09 DIAGNOSIS — D489 Neoplasm of uncertain behavior, unspecified: Secondary | ICD-10-CM

## 2019-07-09 DIAGNOSIS — M25512 Pain in left shoulder: Secondary | ICD-10-CM

## 2019-07-09 DIAGNOSIS — G40009 Localization-related (focal) (partial) idiopathic epilepsy and epileptic syndromes with seizures of localized onset, not intractable, without status epilepticus: Secondary | ICD-10-CM

## 2019-07-09 DIAGNOSIS — C44629 Squamous cell carcinoma of skin of left upper limb, including shoulder: Secondary | ICD-10-CM | POA: Diagnosis not present

## 2019-07-09 NOTE — Patient Instructions (Addendum)
Keep shoulder wound covered with antibiotic ointment for best healing. If any bleeding in this area, hold pressure for a couple of minutes. Let me know if any redness, pain, drainage. I will call you with pathology results when I get them (usually within 2 weeks; please call me if you haven't heard from [place] in this time).   For your left shoulder pain, avoid heavy lifting, keep regular exercises to help with range of motion. If worsening of pain, please let me know. Since pain is not constant, I do not feel we need to xray or add medication at this time, but can consider if you are not noting improvement.  Shoulder Range of Motion Exercises Shoulder range of motion (ROM) exercises are done to keep the shoulder moving freely or to increase movement. They are often recommended for people who have shoulder pain or stiffness or who are recovering from a shoulder surgery. Phase 1 exercises When you are able, do this exercise 1-2 times per day for 30-60 seconds in each direction, or as directed by your health care provider. Pendulum exercise To do this exercise while sitting: 1. Sit in a chair or at the edge of your bed with your feet flat on the floor. 2. Let your affected arm hang down in front of you over the edge of the bed or chair. 3. Relax your shoulder, arm, and hand. [place] your body so your arm gently swings in small circles. You can also use your unaffected arm to start the motion. 5. Repeat changing the direction of the circles, swinging your arm left and right, and swinging your arm forward and back. To do this exercise while standing: 1. Stand next to a sturdy chair or table, and hold on to it with your hand on your unaffected side. 2. Bend forward at the waist. 3. Bend your knees slightly. 4. Relax your shoulder, arm, and hand. 5. While keeping your shoulder relaxed, use body motion to swing your arm in small circles. 6. Repeat changing the direction of the circles, swinging your arm  left and right, and swinging your arm forward and back. 7. Between exercises, stand up tall and take a short break to relax your lower back.  Phase 2 exercises Do these exercises 1-2 times per day or as told by your health care provider. Hold each stretch for 30 seconds, and repeat 3 times. Do the exercises with one or both arms as instructed by your health care provider. For these exercises, sit at a table with your hand and arm supported by the table. A chair that slides easily or has wheels can be helpful. External rotation 1. Turn your chair so that your affected side is nearest to the table. 2. Place your forearm on the table to your side. Bend your elbow about 90 at the elbow (right angle) and place your hand palm facing down on the table. Your elbow should be about 6 inches away from your side. 3. Keeping your arm on the table, lean your body forward. Abduction 1. Turn your chair so that your affected side is nearest to the table. 2. Place your forearm and hand on the table so that your thumb points toward the ceiling and your arm is straight out to your side. 3. Slide your hand out to the side and away from you, using your unaffected arm to do the work. 4. To increase the stretch, you can slide your chair away from the table. Flexion: forward stretch 1. Sit  facing the table. Place your hand and elbow on the table in front of you. 2. Slide your hand forward and away from you, using your unaffected arm to do the work. 3. To increase the stretch, you can slide your chair backward. Phase 3 exercises Do these exercises 1-2 times per day or as told by your health care provider. Hold each stretch for 30 seconds, and repeat 3 times. Do the exercises with one or both arms as instructed by your health care provider. Cross-body stretch: posterior capsule stretch 1. Lift your arm straight out in front of you. 2. Bend your arm 90 at the elbow (right angle) so your forearm moves across your  body. 3. Use your other arm to gently pull the elbow across your body, toward your other shoulder. Wall climbs 1. Stand with your affected arm extended out to the side with your hand resting on a door frame. 2. Slide your hand slowly up the door frame. 3. To increase the stretch, step through the door frame. Keep your body upright and do not lean. Wand exercises You will need a cane, a piece of PVC pipe, or a sturdy wooden dowel for wand exercises. Flexion To do this exercise while standing: 1. Hold the wand with both of your hands, palms down. 2. Using the other arm to help, lift your arms up and over your head, if able. 3. Push upward with your other arm to gently increase the stretch. To do this exercise while lying down: 1. Lie on your back with your elbows resting on the floor and the wand in both your hands. Your hands will be palm down, or pointing toward your feet. 2. Lift your hands toward the ceiling, using your unaffected arm to help if needed. 3. Bring your arms overhead as able, using your unaffected arm to help if needed. Internal rotation 1. Stand while holding the wand behind you with both hands. Your unaffected arm should be extended above your head with the arm of the affected side extended behind you at the level of your waist. The wand should be pointing straight up and down as you hold it. 2. Slowly pull the wand up behind your back by straightening the elbow of your unaffected arm and bending the elbow of your affected arm. External rotation 1. Lie on your back with your affected upper arm supported on a small pillow or rolled towel. When you first do this exercise, keep your upper arm close to your body. Over time, bring your arm up to a 90 angle out to the side. 2. Hold the wand across your stomach and with both hands palm up. Your elbow on your affected side should be bent at a 90 angle. 3. Use your unaffected side to help push your forearm away from you and toward  the floor. Keep your elbow on your affected side bent at a 90 angle. Contact a health care provider if you have:  New or increasing pain.  New numbness, tingling, weakness, or discoloration in your arm or hand. This information is not intended to replace advice given to you by your health care provider. Make sure you discuss any questions you have with your health care provider. Document Revised: 05/30/2017 Document Reviewed: 05/30/2017 Elsevier Patient Education  2020 [employer].

## 2019-07-09 NOTE — Progress Notes (Signed)
[name] [name] DOB: [birth year] Encounter date: 07/09/2019  This is a 64 y.o. female who presents with Chief Complaint  Patient presents with  . Follow-up  . Shoulder Pain    patient complains of left shoulder pain since March 6th, no known injury    History of present illness: About 2 weeks ago started with left shoulder issue - about a week ago was getting dressed and could see in mirror there was hard area - hard to get shirt on. Just certain movements that bother it. Not injured shoulder in past. Hasn't taken anything to help with this.   Last CMP hyponatremia and hypochloremia.   Hyperlipidemia: diet controlled  Migraine: not had these since starting medications.    Follows with Dr. Pamala Hurry for gyn needs. Has appointment in 2 weeks time.  Does pacemaker checks at home. Follows with cardiology.  Follows with Dr. [name] for seizures. Doesn't remember last seizure.   Last colonoscopy was 10/2016 by Dr. Carlean Purl and 5 year follow up suggested for polyps.   No Known Allergies Current Meds  Medication Sig  . Ascorbic Acid (VITAMIN C PO) Take 1 tablet by mouth daily.   . Cholecalciferol (VITAMIN D) 2000 UNITS CAPS Take by mouth 2 (two) times daily.  . fish oil-omega-3 fatty acids 1000 MG capsule Take 2 g by mouth 2 (two) times daily.  . folic acid (FOLVITE) 1 MG tablet Take 1 mg by mouth daily.  . LamoTRIgine 300 MG TB24 24 hour tablet Take 1 tablet by mouth twice daily  . magnesium 30 MG tablet Take 30 mg by mouth daily.   . Multiple Vitamin (MULTIVITAMIN WITH MINERALS) TABS tablet Take 1 tablet by mouth daily.  . Oxcarbazepine (TRILEPTAL) 300 MG tablet TAKE 1 TABLET BY MOUTH ONCE DAILY IN THE MORNING AND 2 ONCE DAILY IN THE EVENING    Review of Systems  Constitutional: Negative for chills, fatigue and fever.  Respiratory: Negative for cough, chest tightness, shortness of breath and wheezing.   Cardiovascular: Negative for chest pain, palpitations and leg swelling.   Musculoskeletal: Positive for arthralgias (see hpi; left shoulder).  Skin:       New hard bump on skin; left shoulder     Objective:  BP 130/80 (BP Location: Right Arm, Patient Position: Sitting, Cuff Size: Large)   Pulse 73   Temp 98 F (36.7 C) (Temporal)   Ht 5\' 3"  (1.6 m)   Wt 173 lb 14.4 oz (78.9 kg)   LMP 08/29/2005   BMI 30.81 kg/m   Weight: 173 lb 14.4 oz (78.9 kg)   BP Readings from Last 3 Encounters:  07/09/19 130/80  02/24/19 137/71  01/08/19 110/80   Wt Readings from Last 3 Encounters:  07/09/19 173 lb 14.4 oz (78.9 kg)  02/24/19 179 lb 3.2 oz (81.3 kg)  01/08/19 175 lb 1.6 oz (79.4 kg)    Physical Exam Constitutional:      General: She is not in acute distress.    Appearance: She is well-developed.  Cardiovascular:     Rate and Rhythm: Normal rate and regular rhythm.     Heart sounds: Normal heart sounds. No murmur. No friction rub.  Pulmonary:     Effort: Pulmonary effort is normal. No respiratory distress.     Breath sounds: Normal breath sounds. No wheezing or rales.  Musculoskeletal:     Right lower leg: No edema.     Left lower leg: No edema.     Comments: Full range of motion of shoulders  bilaterally.  There is some anterior tenderness to palpation of the shoulder, but this is mild.  When shoulders rotated posteriorly pain is reproduced.  Negative Hawkins.  Mild discomfort with supraspinatus testing.  No weakness appreciated in the shoulder.  Skin:    Comments: 0.75 cm scaly white papule left upper shoulder.  Neurological:     Mental Status: She is alert and oriented to person, place, and time.  Psychiatric:        Behavior: Behavior normal.   Shave Biopsy Procedure Note  Pre-operative Diagnosis: Suspicious lesion  Post-operative Diagnosis: same  Locations: Left upper shoulder Anesthesia: Lidocaine 1% with epinephrine   Procedure Details  Patient informed of the risks, including bleeding and infection, and benefits of the  procedure  and Verbal informed consent obtained. The lesion and surrounding area was given a sterile prep using chloraprep and draped in the usual sterile fashion. The skin lesion was shaved superficially using a dermablade.  Hemostasis of biopsy site was achieved using aluminum chloride. Sterile dressing applied. The specimen was sent for pathologic examination. The patient tolerated the procedure well.  Complications: none.   Assessment/Plan  1. Localization-related idiopathic epilepsy and epileptic syndromes with seizures of localized onset, not intractable, without status epilepticus ([place]) Well-controlled.  Follows with neurology.  Continue with Trileptal and Lamictal.  She does have some hyponatremia, likely secondary to medications.  We will monitor for stability.  2. Hyponatremia See above. - Comprehensive metabolic panel; Future  3. Hyperlipidemia, unspecified hyperlipidemia type Diet controlled.  Continue with healthy eating and fish oil supplementation.  4. Vitamin D deficiency Vitamin D supplementation 2000 units daily. - VITAMIN D 25 Hydroxy (Vit-D Deficiency, Fractures); Future  5. 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  Biopsied in office today.  Will send for pathology. Plan: 1. Instructed to keep the wound dry and covered for 24 hrs and clean thereafter with soap and water.  But no chlorine hot tubs or pool exposure to surgical site.  2. Warning signs of infection were reviewed.   3. Recommended that the patient use OTC analgesics as needed for pain.  4. Will contact patient with pathology results when obtained.   - Dermatology pathology   6.  Left shoulder pain: This is mild in nature and only intermittent.  I have asked her to do some gentle range of motion stretches and exercises for the shoulder, ice after activity or when tender.  If not improving would consider x-ray to evaluate underlying bony structure.    Micheline Rough, MD

## 2019-07-10 ENCOUNTER — Other Ambulatory Visit: Payer: Self-pay | Admitting: Family Medicine

## 2019-07-10 DIAGNOSIS — C44529 Squamous cell carcinoma of skin of other part of trunk: Secondary | ICD-10-CM | POA: Insufficient documentation

## 2019-07-15 NOTE — Progress Notes (Signed)
Electrophysiology Office Note Date: 07/17/2019  ID:  Zayanna Yeckley, DOB 30-Dec-1955, MRN [id number]  PCP: [name], MD Electrophysiologist: [name]  CC: Pacemaker follow-up  [name] [name] is a 64 y.o. female seen today for Dr [name].  She presents today for routine electrophysiology followup. She is seen today with sign language interpreter. Since last being seen in our clinic, the patient reports doing very well.  She denies chest pain, palpitations, dyspnea, PND, orthopnea, nausea, vomiting, dizziness, syncope, edema, weight gain, or early satiety.  Device History: MDT dual chamber PPM implanted 2012 for asystole during tilt table testing   Past Medical History:  Diagnosis Date  . Chicken pox   . Conductive hearing loss, childhood onset   . 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 Epilepsy, followed by Dr. Tomma Rakers in neurology 08/26/2012  . Hx of adenomatous colonic polyps 11/19/2016  . Pacemaker   . Pacemaker    2012  . Seizure ([place])   . 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 HIP ARTHROPLASTY    . PACEMAKER INSERTION  07/2010  . TOTAL HIP ARTHROPLASTY  07/2011   right  . TOTAL HIP ARTHROPLASTY  03/2012   left    Current Outpatient Medications  Medication Sig Dispense Refill  . Ascorbic Acid (VITAMIN C PO) Take 1 tablet by mouth daily.     . Cholecalciferol (VITAMIN D) 2000 UNITS CAPS Take by mouth 2 (two) times daily.    . fish oil-omega-3 fatty acids 1000 MG capsule Take 2 g by mouth 2 (two) times daily.    . folic acid (FOLVITE) 1 MG tablet Take 1 mg by mouth daily.    . LamoTRIgine 300 MG TB24 24 hour tablet Take 1 tablet by mouth twice daily 180 tablet 0  . magnesium 30 MG tablet Take 30 mg by mouth daily.     . Multiple Vitamin (MULTIVITAMIN WITH MINERALS) TABS tablet Take 1 tablet by mouth daily.    . Oxcarbazepine (TRILEPTAL) 300 MG tablet TAKE 1 TABLET BY MOUTH ONCE DAILY IN THE MORNING AND 2 ONCE  DAILY IN THE EVENING 270 tablet 1   No current facility-administered medications for this visit.    Allergies:   Patient has no known allergies.   Social History: Social History   Socioeconomic History  . Marital status: Divorced    Spouse name: Not on file  . Number of children: 2  . Years of education: Not on file  . Highest education level: High school graduate  Occupational History  . Not on file  Tobacco Use  . Smoking status: Never Smoker  . Smokeless tobacco: Never Used  Substance and Sexual Activity  . Alcohol use: No    Alcohol/week: 0.0 standard drinks    Comment: none  . Drug use: No  . Sexual activity: Never  Other Topics Concern  . Not on file  Social History Narrative   ** Merged History Encounter **       Work or School: on disability for the deafness, seizure and carpal tunnel      Home Situation: lives with mother and father currently - since 06/2012      Spiritual Beliefs: Christian      Lifestyle: walks twice daily         Social Determinants of Health   Financial Resource Strain:   . Difficulty of Paying Living Expenses:  Food Insecurity:   . Worried About Charity fundraiser in the Last Year:   . Arboriculturist in the Last Year:   Transportation Needs:   . Film/video editor (Medical):   Marland Kitchen Lack of Transportation (Non-Medical):   Physical Activity:   . Days of Exercise per Week:   . Minutes of Exercise per Session:   Stress:   . Feeling of Stress :   Social Connections:   . Frequency of Communication with Friends and Family:   . Frequency of Social Gatherings with Friends and Family:   . Attends Religious Services:   . Active Member of Clubs or Organizations:   . Attends Archivist Meetings:   Marland Kitchen Marital Status:   Intimate Partner Violence:   . Fear of Current or Ex-Partner:   . Emotionally Abused:   Marland Kitchen Physically Abused:   . Sexually Abused:     Family History: Family History  Problem Relation Age of Onset  .  Diabetes Father   . Hypertension Father   . Colon cancer Father 19  . Hypertension Mother   . Healthy Brother   . Healthy Brother   . Esophageal cancer Neg Hx   . Rectal cancer Neg Hx   . Stomach cancer Neg Hx      Review of Systems: All other systems reviewed and are otherwise negative except as noted above.   Physical Exam: VS:  BP 120/80   Pulse 66   Ht 5\' 3"  (1.6 m)   Wt 178 lb 9.6 oz (81 kg)   LMP 08/29/2005   SpO2 99%   BMI 31.64 kg/m  , BMI Body mass index is 31.64 kg/m.  GEN- The patient is well appearing, alert and oriented x 3 today.   HEENT: normocephalic, atraumatic; sclera clear, conjunctiva pink; hearing intact; oropharynx clear; neck supple  Lungs- Clear to ausculation bilaterally, normal work of breathing.  No wheezes, rales, rhonchi Heart- Regular rate and rhythm, no murmurs, rubs or gallops  GI- soft, non-tender, non-distended, bowel sounds present  Extremities- no clubbing, cyanosis, or edema  MS- no significant deformity or atrophy Skin- warm and dry, no rash or lesion; PPM pocket well healed Psych- euthymic mood, full affect Neuro- strength and sensation are intact  PPM Interrogation- reviewed in detail today,  See PACEART report  EKG:  EKG is not ordered today.  Recent Labs: 01/08/2019: ALT 15; BUN 9; Creatinine, Ser 0.82; Hemoglobin 13.2; Platelets 266.0; Potassium 4.4; Sodium 129   Wt Readings from Last 3 Encounters:  07/17/19 178 lb 9.6 oz (81 kg)  07/09/19 173 lb 14.4 oz (78.9 kg)  02/24/19 179 lb 3.2 oz (81.3 kg)     Other studies Reviewed: Additional studies/ records that were reviewed today include: Dr Olin Pia office notes   Assessment and Plan:  1.  Symptomatic sinus node dysfunction Normal PPM function See Pace Art report No changes today  2.  Syncope No recurrence    Current medicines are reviewed at length with the patient today.   The patient does not have concerns regarding her medicines.  The following changes were  made today:  none  Labs/ tests ordered today include: none Orders Placed This Encounter  Procedures  . CUP PACEART INCLINIC DEVICE CHECK     Disposition:   Follow up with [facility], Dr [name] 1 year      Signed, Chanetta Marshall, NP 07/17/2019 11:54 AM  [facility] [address] [place] [address] [phone] (  office) [phone] (fax)

## 2019-07-17 ENCOUNTER — Other Ambulatory Visit: Payer: Self-pay

## 2019-07-17 ENCOUNTER — Ambulatory Visit (INDEPENDENT_AMBULATORY_CARE_PROVIDER_SITE_OTHER): Payer: Medicare Other | Admitting: Nurse Practitioner

## 2019-07-17 ENCOUNTER — Encounter: Payer: Self-pay | Admitting: Nurse Practitioner

## 2019-07-17 VITALS — BP 120/80 | HR 66 | Ht 63.0 in | Wt 178.6 lb

## 2019-07-17 DIAGNOSIS — R55 Syncope and collapse: Secondary | ICD-10-CM | POA: Diagnosis not present

## 2019-07-17 DIAGNOSIS — I495 Sick sinus syndrome: Secondary | ICD-10-CM

## 2019-07-17 LAB — CUP PACEART INCLINIC DEVICE CHECK
Battery Impedance: 660 Ohm
Battery Remaining Longevity: 92 mo
Battery Voltage: 2.77 V
Brady Statistic AP VP Percent: 0 %
Brady Statistic AP VS Percent: 0 %
Brady Statistic AS VP Percent: 0 %
Brady Statistic AS VS Percent: 100 %
Date Time Interrogation Session: 20210318115144
Implantable Lead Implant Date: 20120409
Implantable Lead Implant Date: 20120409
Implantable Lead Location: 753859
Implantable Lead Location: 753860
Implantable Lead Model: 4076
Implantable Lead Model: 4076
Implantable Pulse Generator Implant Date: 20120409
Lead Channel Impedance Value: 529 Ohm
Lead Channel Impedance Value: 622 Ohm
Lead Channel Pacing Threshold Amplitude: 0.5 V
Lead Channel Pacing Threshold Amplitude: 0.625 V
Lead Channel Pacing Threshold Amplitude: 0.75 V
Lead Channel Pacing Threshold Pulse Width: 0.4 ms
Lead Channel Pacing Threshold Pulse Width: 0.4 ms
Lead Channel Pacing Threshold Pulse Width: 0.4 ms
Lead Channel Sensing Intrinsic Amplitude: 22.4 mV
Lead Channel Sensing Intrinsic Amplitude: 4 mV
Lead Channel Setting Pacing Amplitude: 2 V
Lead Channel Setting Pacing Amplitude: 2.5 V
Lead Channel Setting Pacing Pulse Width: 0.4 ms
Lead Channel Setting Sensing Sensitivity: 5.6 mV

## 2019-07-17 NOTE — Patient Instructions (Addendum)
Medication Instructions:  none *If you need a refill on your cardiac medications before your next appointment, please call your pharmacy*   Lab Work: none If you have labs (blood work) drawn today and your tests are completely normal, you will receive your results only by: Marland Kitchen MyChart Message (if you have MyChart) OR . A paper copy in the mail If you have any lab test that is abnormal or we need to change your treatment, we will call you to review the results.   Testing/Procedures: none   Follow-Up: At [facility], you and your health needs are our priority.  As part of our continuing mission to provide you with exceptional heart care, we have created designated Provider Care Teams.  These Care Teams include your primary Cardiologist (physician) and Advanced Practice Providers (APPs -  Physician Assistants and Nurse Practitioners) who all work together to provide you with the care you need, when you need it.  Your next appointment:   1 year(s)  The format for your next appointment:   Either In Person or Virtual  Provider:   Dr [name]   Other Instructions Remote monitoring is used to monitor your Pacemaker  from home. This monitoring reduces the number of office visits required to check your device to one time per year. It allows [place] to keep an eye on the functioning of your device to ensure it is working properly. You are scheduled for a device check from home on 08/26/19. You may send your transmission at any time that day. If you have a wireless device, the transmission will be sent automatically. After your physician reviews your transmission, you will receive a postcard with your next transmission date.

## 2019-07-21 ENCOUNTER — Other Ambulatory Visit: Payer: Self-pay | Admitting: Neurology

## 2019-08-03 ENCOUNTER — Other Ambulatory Visit: Payer: Self-pay | Admitting: Neurology

## 2019-08-18 DIAGNOSIS — C44629 Squamous cell carcinoma of skin of left upper limb, including shoulder: Secondary | ICD-10-CM | POA: Diagnosis not present

## 2019-08-26 ENCOUNTER — Ambulatory Visit (INDEPENDENT_AMBULATORY_CARE_PROVIDER_SITE_OTHER): Payer: Medicare Other | Admitting: *Deleted

## 2019-08-26 DIAGNOSIS — I495 Sick sinus syndrome: Secondary | ICD-10-CM | POA: Diagnosis not present

## 2019-08-26 LAB — CUP PACEART REMOTE DEVICE CHECK
Battery Impedance: 711 Ohm
Battery Remaining Longevity: 89 mo
Battery Voltage: 2.77 V
Brady Statistic AP VP Percent: 0 %
Brady Statistic AP VS Percent: 0 %
Brady Statistic AS VP Percent: 0 %
Brady Statistic AS VS Percent: 100 %
Date Time Interrogation Session: 20210427065728
Implantable Lead Implant Date: 20120409
Implantable Lead Implant Date: 20120409
Implantable Lead Location: 753859
Implantable Lead Location: 753860
Implantable Lead Model: 4076
Implantable Lead Model: 4076
Implantable Pulse Generator Implant Date: 20120409
Lead Channel Impedance Value: 553 Ohm
Lead Channel Impedance Value: 628 Ohm
Lead Channel Pacing Threshold Amplitude: 0.5 V
Lead Channel Pacing Threshold Amplitude: 0.625 V
Lead Channel Pacing Threshold Pulse Width: 0.4 ms
Lead Channel Pacing Threshold Pulse Width: 0.4 ms
Lead Channel Setting Pacing Amplitude: 2 V
Lead Channel Setting Pacing Amplitude: 2.5 V
Lead Channel Setting Pacing Pulse Width: 0.4 ms
Lead Channel Setting Sensing Sensitivity: 5.6 mV

## 2019-08-27 NOTE — Progress Notes (Signed)
PPM Remote  

## 2019-09-12 DIAGNOSIS — L905 Scar conditions and fibrosis of skin: Secondary | ICD-10-CM | POA: Diagnosis not present

## 2019-09-12 DIAGNOSIS — C44629 Squamous cell carcinoma of skin of left upper limb, including shoulder: Secondary | ICD-10-CM | POA: Diagnosis not present

## 2019-10-19 ENCOUNTER — Other Ambulatory Visit: Payer: Self-pay | Admitting: Neurology

## 2019-11-02 ENCOUNTER — Other Ambulatory Visit: Payer: Self-pay | Admitting: Neurology

## 2019-11-25 ENCOUNTER — Ambulatory Visit (INDEPENDENT_AMBULATORY_CARE_PROVIDER_SITE_OTHER): Payer: Medicare Other | Admitting: *Deleted

## 2019-11-25 DIAGNOSIS — I495 Sick sinus syndrome: Secondary | ICD-10-CM

## 2019-11-26 LAB — CUP PACEART REMOTE DEVICE CHECK
Battery Impedance: 760 Ohm
Battery Remaining Longevity: 86 mo
Battery Voltage: 2.77 V
Brady Statistic AP VP Percent: 0 %
Brady Statistic AP VS Percent: 0 %
Brady Statistic AS VP Percent: 0 %
Brady Statistic AS VS Percent: 100 %
Date Time Interrogation Session: 20210727130252
Implantable Lead Implant Date: 20120409
Implantable Lead Implant Date: 20120409
Implantable Lead Location: 753859
Implantable Lead Location: 753860
Implantable Lead Model: 4076
Implantable Lead Model: 4076
Implantable Pulse Generator Implant Date: 20120409
Lead Channel Impedance Value: 572 Ohm
Lead Channel Impedance Value: 645 Ohm
Lead Channel Pacing Threshold Amplitude: 0.5 V
Lead Channel Pacing Threshold Amplitude: 0.625 V
Lead Channel Pacing Threshold Pulse Width: 0.4 ms
Lead Channel Pacing Threshold Pulse Width: 0.4 ms
Lead Channel Setting Pacing Amplitude: 2 V
Lead Channel Setting Pacing Amplitude: 2.5 V
Lead Channel Setting Pacing Pulse Width: 0.4 ms
Lead Channel Setting Sensing Sensitivity: 5.6 mV

## 2019-11-27 NOTE — Progress Notes (Signed)
Remote pacemaker transmission.   

## 2020-01-09 ENCOUNTER — Other Ambulatory Visit: Payer: Self-pay

## 2020-01-09 ENCOUNTER — Ambulatory Visit (INDEPENDENT_AMBULATORY_CARE_PROVIDER_SITE_OTHER): Payer: Medicare Other | Admitting: Family Medicine

## 2020-01-09 ENCOUNTER — Encounter: Payer: Self-pay | Admitting: Family Medicine

## 2020-01-09 VITALS — BP 150/84 | HR 67 | Temp 97.7°F | Ht 62.0 in | Wt 168.0 lb

## 2020-01-09 DIAGNOSIS — E785 Hyperlipidemia, unspecified: Secondary | ICD-10-CM

## 2020-01-09 DIAGNOSIS — H9193 Unspecified hearing loss, bilateral: Secondary | ICD-10-CM | POA: Diagnosis not present

## 2020-01-09 DIAGNOSIS — R030 Elevated blood-pressure reading, without diagnosis of hypertension: Secondary | ICD-10-CM

## 2020-01-09 DIAGNOSIS — Z0000 Encounter for general adult medical examination without abnormal findings: Secondary | ICD-10-CM | POA: Diagnosis not present

## 2020-01-09 DIAGNOSIS — Z79899 Other long term (current) drug therapy: Secondary | ICD-10-CM | POA: Diagnosis not present

## 2020-01-09 DIAGNOSIS — E538 Deficiency of other specified B group vitamins: Secondary | ICD-10-CM

## 2020-01-09 DIAGNOSIS — E559 Vitamin D deficiency, unspecified: Secondary | ICD-10-CM

## 2020-01-09 DIAGNOSIS — Z23 Encounter for immunization: Secondary | ICD-10-CM

## 2020-01-09 DIAGNOSIS — R42 Dizziness and giddiness: Secondary | ICD-10-CM

## 2020-01-09 DIAGNOSIS — L989 Disorder of the skin and subcutaneous tissue, unspecified: Secondary | ICD-10-CM

## 2020-01-09 DIAGNOSIS — G40209 Localization-related (focal) (partial) symptomatic epilepsy and epileptic syndromes with complex partial seizures, not intractable, without status epilepticus: Secondary | ICD-10-CM

## 2020-01-09 NOTE — Patient Instructions (Signed)
We will have you return to biopsy skin lesion on arm (unless you want to follow up with dermatology for this).   I will call you with lab results when I get them and we will determine next step with dizziness at that point.

## 2020-01-09 NOTE — Progress Notes (Signed)
[name] [name] DOB: Dec 25, 1955 Encounter date: 01/09/2020  This is a 64 y.o. female who presents for complete physical   History of present illness/Additional concerns: Last visit in March 2021; referred to derm for follow up due to shave lesion being squamous cell carcinoma. She had additional deeper excision by Dr. Winifred Olive.   Been having dizzy spells. Started 2 weeks ago felt "flash, burst" and had dizzy spell. Sometimes dizzy/off balance. Sometimes migraine. Better this morning. Goes away with eating or drinking and without medication. Vision has been ok between spells. Sometimes happens when straight up; not always with changes in head position. Not every day. Every once in awhile would feel this way, but nothing that lasted. Describes the flash or burst as something in head; just sensation; not painful. Usually left frontal. Balance off during spells. Dizzy is better almost immediately with eating/drinking. Headache takes longer to go away - sometimes an hour; sometimes will last for a couple of days. At first really bad and then improves.   No changes in medication, energy level is same.   Hyperlipidemia: diet controlled; last checked in 12/2018  Follows with Dr. Pamala Hurry for gyn needs. Last mammogram result: negative 11/2018.   Follows with cardiology regularly for pacemaker checks.   Next colonoscopy due 10/2021   Past Medical History:  Diagnosis Date  . Chicken pox   . Conductive hearing loss, childhood onset   . 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 Epilepsy, followed by Dr. Tomma Rakers in neurology 08/26/2012  . Hx of adenomatous colonic polyps 11/19/2016  . Pacemaker   . Pacemaker    2012  . Seizure ([place])   . 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 HIP ARTHROPLASTY    . PACEMAKER INSERTION  07/2010  . TOTAL HIP ARTHROPLASTY  07/2011   right  . TOTAL HIP ARTHROPLASTY  03/2012   left   No Known Allergies Current Meds   Medication Sig  . Ascorbic Acid (VITAMIN C PO) Take 1 tablet by mouth daily.   . Cholecalciferol (VITAMIN D) 2000 UNITS CAPS Take by mouth 2 (two) times daily.  . fish oil-omega-3 fatty acids 1000 MG capsule Take 2 g by mouth 2 (two) times daily.  . folic acid (FOLVITE) 1 MG tablet Take 1 mg by mouth daily.  . LamoTRIgine 300 MG TB24 24 hour tablet Take 1 tablet by mouth twice daily  . magnesium 30 MG tablet Take 30 mg by mouth daily.   . Multiple Vitamin (MULTIVITAMIN WITH MINERALS) TABS tablet Take 1 tablet by mouth daily.  . Oxcarbazepine (TRILEPTAL) 300 MG tablet TAKE 1 TABLET BY MOUTH IN THE MORNING 2 IN THE EVENING   Social History   Tobacco Use  . Smoking status: Never Smoker  . Smokeless tobacco: Never Used  Substance Use Topics  . Alcohol use: No    Alcohol/week: 0.0 standard drinks    Comment: none   Family History  Problem Relation Age of Onset  . Diabetes Father   . Hypertension Father   . Colon cancer Father 65  . Hypertension Mother   . Other Mother        skin lesions  . Diabetes Mother        may be borderline  . Healthy Brother   . Healthy Brother   . Esophageal cancer Neg Hx   . Rectal cancer Neg Hx   . Stomach  cancer Neg Hx      Review of Systems  Constitutional: Negative for activity change, appetite change, chills, fatigue, fever and unexpected weight change.  HENT: Negative for congestion, ear pain, hearing loss, sinus pressure, sinus pain, sore throat and trouble swallowing.   Eyes: Negative for pain and visual disturbance.  Respiratory: Negative for cough, chest tightness, shortness of breath and wheezing.   Cardiovascular: Negative for chest pain, palpitations and leg swelling.  Gastrointestinal: Negative for abdominal pain, blood in stool, constipation, diarrhea, nausea and vomiting.  Genitourinary: Negative for difficulty urinating and menstrual problem.  Musculoskeletal: Negative for arthralgias and back pain.  Skin: Negative for rash.   Neurological: Negative for dizziness, weakness, numbness and headaches.  Hematological: Negative for adenopathy. Does not bruise/bleed easily.  Psychiatric/Behavioral: Negative for sleep disturbance and suicidal ideas. The patient is not nervous/anxious.     CBC:  Lab Results  Component Value Date   WBC 7.0 01/09/2020   HGB 12.5 01/09/2020   HCT 37.7 01/09/2020   MCH 30.8 01/09/2020   MCHC 33.2 01/09/2020   RDW 12.8 01/09/2020   PLT 278 01/09/2020   MPV 9.6 01/09/2020   CMP: Lab Results  Component Value Date   NA 133 (L) 01/09/2020   K 4.4 01/09/2020   CL 95 (L) 01/09/2020   CO2 28 01/09/2020   ANIONGAP 15 03/29/2014   GLUCOSE 83 01/09/2020   BUN 9 01/09/2020   CREATININE 0.85 01/09/2020   GFRAA >90 03/29/2014   GFRAA 82 09/12/2013   CALCIUM 9.6 01/09/2020   PROT 7.5 01/09/2020   BILITOT 0.5 01/09/2020   ALKPHOS 84 01/08/2019   ALT 40 (H) 01/09/2020   AST 25 01/09/2020   LIPID: Lab Results  Component Value Date   CHOL 212 (H) 01/09/2020   TRIG 51 01/09/2020   HDL 75 01/09/2020   LDLCALC 123 (H) 01/09/2020    Objective:  BP (!) 150/84 (BP Location: Left Arm, Patient Position: Sitting, Cuff Size: Large)   Pulse 67   Temp 97.7 F (36.5 C) (Oral)   Ht 5\' 2"  (1.575 m)   Wt 168 lb (76.2 kg)   LMP 08/29/2005   SpO2 99%   BMI 30.73 kg/m   Weight: 168 lb (76.2 kg)   BP Readings from Last 3 Encounters:  01/09/20 (!) 150/84  07/17/19 120/80  07/09/19 130/80   Wt Readings from Last 3 Encounters:  01/09/20 168 lb (76.2 kg)  07/17/19 178 lb 9.6 oz (81 kg)  07/09/19 173 lb 14.4 oz (78.9 kg)    Physical Exam Constitutional:      General: She is not in acute distress.    Appearance: She is well-developed.  HENT:     Head: Normocephalic and atraumatic.     Right Ear: External ear normal.     Left Ear: External ear normal.     Mouth/Throat:     Pharynx: No oropharyngeal exudate.  Eyes:     Conjunctiva/sclera: Conjunctivae normal.     Pupils: Pupils are  equal, round, and reactive to light.  Neck:     Thyroid: No thyromegaly.  Cardiovascular:     Rate and Rhythm: Normal rate and regular rhythm.     Heart sounds: Normal heart sounds. No murmur heard.  No friction rub. No gallop.   Pulmonary:     Effort: Pulmonary effort is normal.     Breath sounds: Normal breath sounds.  Abdominal:     General: Bowel sounds are normal. There is no distension.  Palpations: Abdomen is soft. There is no mass.     Tenderness: There is no abdominal tenderness. There is no guarding.     Hernia: No hernia is present.  Musculoskeletal:        General: No tenderness or deformity. Normal range of motion.     Cervical back: Normal range of motion and neck supple.     Comments: Walks with limp due to hip pain  Lymphadenopathy:     Cervical: No cervical adenopathy.  Skin:    General: Skin is warm and dry.     Findings: No rash.     Comments: Pink scaly papule left forearm approx 47mm  Neurological:     Mental Status: She is alert and oriented to person, place, and time.     Deep Tendon Reflexes: Reflexes normal.     Reflex Scores:      Tricep reflexes are 2+ on the right side and 2+ on the left side.      Bicep reflexes are 2+ on the right side and 2+ on the left side.      Brachioradialis reflexes are 2+ on the right side and 2+ on the left side.      Patellar reflexes are 2+ on the right side and 2+ on the left side. Psychiatric:        Speech: Speech normal.        Behavior: Behavior normal.        Thought Content: Thought content normal.     Assessment/Plan: Health Maintenance Due  Topic Date Due  . COVID-19 Vaccine (1) Never done   Health Maintenance reviewed.  1. Preventative health care She is up-to-date with preventative health care. Will encourage her to get COVID vaccination. Shingrix completed today.  2. Bilateral deafness Chronic.   3. Partial epilepsy with impairment of consciousness ([place]) Follows regularly with neurology.  Has  been seizure-free since last visit  4. Hyperlipidemia, unspecified hyperlipidemia type We will recheck baseline lipids today. - Lipid panel; Future - TSH; Future - TSH - Lipid panel  5. Vitamin D deficiency - VITAMIN D 25 Hydroxy (Vit-D Deficiency, Fractures); Future - VITAMIN D 25 Hydroxy (Vit-D Deficiency, Fractures)  6. High risk medication use - CBC with Differential/Platelet; Future - Comprehensive metabolic panel; Future - Hemoglobin A1c; Future - Hemoglobin A1c - Comprehensive metabolic panel - CBC with Differential/Platelet  7. B12 deficiency - Vitamin B12; Future - Vitamin B12  8. Need for immunization against influenza - Flu Vaccine QUAD 6+ mos PF IM (Fluarix Quad PF)  9. Need for shingles vaccine - Varicella-zoster vaccine IM (Shingrix)  10. Skin lesion Suggested to follow back up in the office to remove this.  I also suggested that he follow-up regularly with dermatology, but I think she would prefer to have everything addressed in one option if possible.  11. Elevated blood pressure reading Advised her to check blood pressures at home and we will check in with her when we get lab results.  Blood pressures typically been controlled.  She was more elevated on recheck today.  12. Dizziness: Orthostatics were stable. Neuro exam was normal today. Will check in with neurology as well to update on symptoms. We will complete bloodwork and check back in with her once we get results.    Return for skin lesion biopsy when able; then CCV in 6 months.  Micheline Rough, MD

## 2020-01-10 LAB — VITAMIN D 25 HYDROXY (VIT D DEFICIENCY, FRACTURES): Vit D, 25-Hydroxy: 62 ng/mL (ref 30–100)

## 2020-01-10 LAB — CBC WITH DIFFERENTIAL/PLATELET
Absolute Monocytes: 546 cells/uL (ref 200–950)
Basophils Absolute: 70 cells/uL (ref 0–200)
Basophils Relative: 1 %
Eosinophils Absolute: 91 cells/uL (ref 15–500)
Eosinophils Relative: 1.3 %
HCT: 37.7 % (ref 35.0–45.0)
Hemoglobin: 12.5 g/dL (ref 11.7–15.5)
Lymphs Abs: 1862 cells/uL (ref 850–3900)
MCH: 30.8 pg (ref 27.0–33.0)
MCHC: 33.2 g/dL (ref 32.0–36.0)
MCV: 92.9 fL (ref 80.0–100.0)
MPV: 9.6 fL (ref 7.5–12.5)
Monocytes Relative: 7.8 %
Neutro Abs: 4431 cells/uL (ref 1500–7800)
Neutrophils Relative %: 63.3 %
Platelets: 278 10*3/uL (ref 140–400)
RBC: 4.06 10*6/uL (ref 3.80–5.10)
RDW: 12.8 % (ref 11.0–15.0)
Total Lymphocyte: 26.6 %
WBC: 7 10*3/uL (ref 3.8–10.8)

## 2020-01-10 LAB — COMPREHENSIVE METABOLIC PANEL
AG Ratio: 1.5 (calc) (ref 1.0–2.5)
ALT: 40 U/L — ABNORMAL HIGH (ref 6–29)
AST: 25 U/L (ref 10–35)
Albumin: 4.5 g/dL (ref 3.6–5.1)
Alkaline phosphatase (APISO): 112 U/L (ref 37–153)
BUN: 9 mg/dL (ref 7–25)
CO2: 28 mmol/L (ref 20–32)
Calcium: 9.6 mg/dL (ref 8.6–10.4)
Chloride: 95 mmol/L — ABNORMAL LOW (ref 98–110)
Creat: 0.85 mg/dL (ref 0.50–0.99)
Globulin: 3 g/dL (calc) (ref 1.9–3.7)
Glucose, Bld: 83 mg/dL (ref 65–99)
Potassium: 4.4 mmol/L (ref 3.5–5.3)
Sodium: 133 mmol/L — ABNORMAL LOW (ref 135–146)
Total Bilirubin: 0.5 mg/dL (ref 0.2–1.2)
Total Protein: 7.5 g/dL (ref 6.1–8.1)

## 2020-01-10 LAB — LIPID PANEL
Cholesterol: 212 mg/dL — ABNORMAL HIGH (ref <200)
HDL: 75 mg/dL (ref >=50)
LDL Cholesterol (Calc): 123 mg/dL — ABNORMAL HIGH
Non-HDL Cholesterol (Calc): 137 mg/dL — ABNORMAL HIGH (ref <130)
Total CHOL/HDL Ratio: 2.8 (calc) (ref <5.0)
Triglycerides: 51 mg/dL (ref <150)

## 2020-01-10 LAB — HEMOGLOBIN A1C
Hgb A1c MFr Bld: 5.1 %{Hb} (ref <5.7)
Mean Plasma Glucose: 100 (calc)
eAG (mmol/L): 5.5 (calc)

## 2020-01-10 LAB — VITAMIN B12: Vitamin B-12: 901 pg/mL (ref 200–1100)

## 2020-01-10 LAB — TSH: TSH: 1.3 mIU/L (ref 0.40–4.50)

## 2020-01-14 ENCOUNTER — Telehealth: Payer: Self-pay | Admitting: Family Medicine

## 2020-01-14 NOTE — Telephone Encounter (Signed)
Pt mother was retuning a call for results on the pt. Please call back when you can

## 2020-01-15 NOTE — Telephone Encounter (Signed)
See results note. 

## 2020-01-19 ENCOUNTER — Other Ambulatory Visit: Payer: Self-pay | Admitting: Neurology

## 2020-02-02 ENCOUNTER — Other Ambulatory Visit: Payer: Self-pay | Admitting: Neurology

## 2020-02-23 NOTE — Progress Notes (Signed)
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 She is accompanied by an interpreter.  UPDATE: Current medications: Lamotrigine ER 300 mg twice daily, oxcarbazepine 300 mg in a.m. and 600 mg in p.m.  Once in awhile, she may have a brief dizzy spell.  No loss of awareness. It is brief, just a few seconds.  She had a drop attack a couple of weeks ago.  In August she had a migraine associated with dizziness.  The migraine lasted two days.    01/09/2020 LABS:  CBC with WBC 7, HGB 12.5, HCT 37.7, PLT 278; B12 901; D 62; TSH 1.30; Hgb A1c 5.1; CMP with Na 133, K 4.4, Cl 95, CO2 28, Ca 9.6, glucose 83, BUN 9, Cr 0.85, t bili 0.5, ALP 112, AST 25, ALT 40.  HISTORY: Two types: 1) Staring spells, not fully respond, sometimes aware, tenses up but no convulsions, lasts seconds to a minute.  2) Drop attacks, loses conscious and falls.Sometimes able to hold on to something.She has had recurrence of drop attacks as well as dizzy spells for the past year.It seems to have started following the death of her father.The spells were well controlled up until that time.She does endorse depression.She has one spell a month, usually at the end of the month.  Due to recurrence of drop attacks, she had another EEG performed on 06/17/14, which was normal.Due to dizziness, trough levels of her antiepileptic medications were checked to look for toxicity, which were normal.Levels were normal.  She also has migraines once in awhile.  Prior medications:She had side effects to Trileptal at 900mg .Other medications tried, include Dilantin (ineffective), and immediate-release lamotrigine (ineffective).  Prior studies: EEG:  focal slowing and frequent spike and wave activity in left anterior temporal lobe. MRI Brain (04/13/97): focal area of encephalomalacia in posterior left temporal lobe. MRI Brain (10/19/08): possible left mesial temporal sclerosis and lesion in left posterior temporal lobe.  She does not drive.  When she was [age] years old, she developed a virus following a case of the chicken pox. 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  PAST MEDICAL HISTORY: Past Medical History:  Diagnosis Date  . Chicken pox   . Conductive hearing loss, childhood onset   . 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 Epilepsy, followed by Dr. Tomma Rakers in neurology 08/26/2012  . Hx of adenomatous colonic polyps 11/19/2016  . Pacemaker   . Pacemaker    2012  . Seizure ([place])   . Seizures ([place])    Petite Mal     MEDICATIONS: Current Outpatient Medications on File Prior to Visit  Medication Sig Dispense Refill  . Oxcarbazepine (TRILEPTAL) 300 MG tablet TAKE 1 TABLET BY MOUTH IN THE MORNING AND 2 IN THE EVENING 270 tablet 0  . Ascorbic Acid (VITAMIN C PO) Take 1 tablet by mouth daily.     . Cholecalciferol (VITAMIN D) 2000 UNITS CAPS Take by mouth 2 (two) times daily.    . fish oil-omega-3 fatty acids 1000 MG capsule Take 2 g by mouth 2 (two) times daily.    . folic acid (FOLVITE) 1 MG tablet Take 1 mg by mouth daily.    Marland Kitchen  LamoTRIgine 300 MG TB24 24 hour tablet Take 1 tablet by mouth twice daily 180 tablet 0  . magnesium 30 MG tablet Take 30 mg by mouth daily.     . Multiple Vitamin (MULTIVITAMIN WITH MINERALS) TABS tablet Take 1 tablet by mouth daily.     No current facility-administered medications on file prior to visit.    ALLERGIES: No Known Allergies  FAMILY HISTORY: Family History  Problem Relation Age of Onset  . Diabetes Father   . Hypertension Father   . Colon cancer Father 25  . Hypertension Mother   . Other Mother        skin lesions  .  Diabetes Mother        may be borderline  . Healthy Brother   . Healthy Brother   . Esophageal cancer Neg Hx   . Rectal cancer Neg Hx   . Stomach cancer Neg Hx     SOCIAL HISTORY: Social History   Socioeconomic History  . Marital status: Divorced    Spouse name: Not on file  . Number of children: 2  . Years of education: Not on file  . Highest education level: High school graduate  Occupational History  . Not on file  Tobacco Use  . Smoking status: Never Smoker  . Smokeless tobacco: Never Used  Vaping Use  . Vaping Use: Never used  Substance and Sexual Activity  . Alcohol use: No    Alcohol/week: 0.0 standard drinks    Comment: none  . Drug use: No  . Sexual activity: Never  Other Topics Concern  . Not on file  Social History Narrative   ** Merged History Encounter **       Work or School: on disability for the deafness, seizure and carpal tunnel      Home Situation: lives with mother and father currently - since 06/2012      Spiritual Beliefs: Christian      Lifestyle: walks twice daily         Social Determinants of Health   Financial Resource Strain:   . Difficulty of Paying Living Expenses: Not on file  Food Insecurity:   . Worried About Charity fundraiser in the Last Year: Not on file  . Ran Out of Food in the Last Year: Not on file  Transportation Needs:   . Lack of Transportation (Medical): Not on file  . Lack of Transportation (Non-Medical): Not on file  Physical Activity:   . Days of Exercise per Week: Not on file  . Minutes of Exercise per Session: Not on file  Stress:   . Feeling of Stress : Not on file  Social Connections:   . Frequency of Communication with Friends and Family: Not on file  . Frequency of Social Gatherings with Friends and Family: Not on file  . Attends Religious Services: Not on file  . Active Member of Clubs or Organizations: Not on file  . Attends Archivist Meetings: Not on file  . Marital Status: Not on  file  Intimate Partner Violence:   . Fear of Current or Ex-Partner: Not on file  . Emotionally Abused: Not on file  . Physically Abused: Not on file  . Sexually Abused: Not on file   PHYSICAL EXAM: Blood pressure (!) 169/96, pulse 69, height 5\' 2"  (1.575 m), weight 169 lb (76.7 kg), last menstrual period 08/29/2005, SpO2 97 %. General: No acute distress.  Patient appears well-groomed.   Head:  Normocephalic/atraumatic Eyes:  Fundi examined but not visualized Neck: supple, no paraspinal tenderness, full range of motion Heart:  Regular rate and rhythm Lungs:  Clear to auscultation bilaterally Back: No paraspinal tenderness Neurological Exam: alert and oriented to person, place, and time. Attention span and concentration intact, recent and remote memory intact, fund of knowledge intact.  Speech fluent and not dysarthric, language intact.  Deaf.  Otherwise, CN II-XII intact. Bulk and tone normal, muscle strength 5/5 throughout.  Sensation to light touch, temperature and vibration intact.  Deep tendon reflexes 2+ throughout, toes downgoing.  Finger to nose and heel to shin testing intact.  Gait normal, Romberg negative.  IMPRESSION: 1.  Staring spells.  Unclear if they are complex partial epilepsy 2.  Drop attacks.  Unclear if they are complex partial epilepsy or related to dizziness 3.  Recurrent dizzy spells.  Episodic and infrequent but chronic issue. 4.  Migraine  PLAN: 1.  Lamotrigine ER 300mg  twice daily and oxcarbazepine 300mg  in AM and 600mg  in PM 2.  Check levels. 3.  Otherwise follow up in one year.  Metta Clines, DO  CC: Micheline Rough, MD

## 2020-02-24 ENCOUNTER — Other Ambulatory Visit: Payer: Self-pay

## 2020-02-24 ENCOUNTER — Ambulatory Visit (INDEPENDENT_AMBULATORY_CARE_PROVIDER_SITE_OTHER): Payer: Medicare Other | Admitting: Neurology

## 2020-02-24 ENCOUNTER — Ambulatory Visit (INDEPENDENT_AMBULATORY_CARE_PROVIDER_SITE_OTHER): Payer: Medicare Other

## 2020-02-24 ENCOUNTER — Other Ambulatory Visit (INDEPENDENT_AMBULATORY_CARE_PROVIDER_SITE_OTHER): Payer: Medicare Other

## 2020-02-24 ENCOUNTER — Encounter: Payer: Self-pay | Admitting: Neurology

## 2020-02-24 VITALS — BP 169/96 | HR 69 | Ht 62.0 in | Wt 169.0 lb

## 2020-02-24 DIAGNOSIS — R42 Dizziness and giddiness: Secondary | ICD-10-CM

## 2020-02-24 DIAGNOSIS — R55 Syncope and collapse: Secondary | ICD-10-CM

## 2020-02-24 DIAGNOSIS — G43009 Migraine without aura, not intractable, without status migrainosus: Secondary | ICD-10-CM

## 2020-02-24 DIAGNOSIS — R001 Bradycardia, unspecified: Secondary | ICD-10-CM | POA: Diagnosis not present

## 2020-02-24 LAB — CUP PACEART REMOTE DEVICE CHECK
Battery Impedance: 861 Ohm
Battery Remaining Longevity: 80 mo
Battery Voltage: 2.77 V
Brady Statistic AP VP Percent: 0 %
Brady Statistic AP VS Percent: 0 %
Brady Statistic AS VP Percent: 0 %
Brady Statistic AS VS Percent: 100 %
Date Time Interrogation Session: 20211026095311
Implantable Lead Implant Date: 20120409
Implantable Lead Implant Date: 20120409
Implantable Lead Location: 753859
Implantable Lead Location: 753860
Implantable Lead Model: 4076
Implantable Lead Model: 4076
Implantable Pulse Generator Implant Date: 20120409
Lead Channel Impedance Value: 553 Ohm
Lead Channel Impedance Value: 655 Ohm
Lead Channel Pacing Threshold Amplitude: 0.5 V
Lead Channel Pacing Threshold Amplitude: 0.625 V
Lead Channel Pacing Threshold Pulse Width: 0.4 ms
Lead Channel Pacing Threshold Pulse Width: 0.4 ms
Lead Channel Setting Pacing Amplitude: 2 V
Lead Channel Setting Pacing Amplitude: 2.5 V
Lead Channel Setting Pacing Pulse Width: 0.4 ms
Lead Channel Setting Sensing Sensitivity: 5.6 mV

## 2020-02-24 NOTE — Patient Instructions (Signed)
1.  Will check lamotrigine and oxcarbazepine levels 2.  Follow up in one year

## 2020-02-26 LAB — LAMOTRIGINE LEVEL: Lamotrigine Lvl: 10.8 ug/mL (ref 4.0–18.0)

## 2020-02-26 LAB — 10-HYDROXYCARBAZEPINE: Triliptal/MTB(Oxcarbazepin): 19.1 ug/mL (ref 8.0–35.0)

## 2020-02-26 NOTE — Progress Notes (Signed)
Remote pacemaker transmission.   

## 2020-03-24 DIAGNOSIS — Z1231 Encounter for screening mammogram for malignant neoplasm of breast: Secondary | ICD-10-CM | POA: Diagnosis not present

## 2020-03-24 DIAGNOSIS — Z7689 Persons encountering health services in other specified circumstances: Secondary | ICD-10-CM | POA: Diagnosis not present

## 2020-04-19 ENCOUNTER — Other Ambulatory Visit: Payer: Self-pay | Admitting: Neurology

## 2020-05-03 ENCOUNTER — Other Ambulatory Visit: Payer: Self-pay | Admitting: Neurology

## 2020-05-12 DIAGNOSIS — M19011 Primary osteoarthritis, right shoulder: Secondary | ICD-10-CM | POA: Diagnosis not present

## 2020-05-25 ENCOUNTER — Ambulatory Visit (INDEPENDENT_AMBULATORY_CARE_PROVIDER_SITE_OTHER): Payer: Medicare Other

## 2020-05-25 DIAGNOSIS — I495 Sick sinus syndrome: Secondary | ICD-10-CM | POA: Diagnosis not present

## 2020-05-26 LAB — CUP PACEART REMOTE DEVICE CHECK
Battery Impedance: 911 Ohm
Battery Remaining Longevity: 78 mo
Battery Voltage: 2.77 V
Brady Statistic AP VP Percent: 0 %
Brady Statistic AP VS Percent: 0 %
Brady Statistic AS VP Percent: 0 %
Brady Statistic AS VS Percent: 100 %
Date Time Interrogation Session: 20220126113239
Implantable Lead Implant Date: 20120409
Implantable Lead Implant Date: 20120409
Implantable Lead Location: 753859
Implantable Lead Location: 753860
Implantable Lead Model: 4076
Implantable Lead Model: 4076
Implantable Pulse Generator Implant Date: 20120409
Lead Channel Impedance Value: 609 Ohm
Lead Channel Impedance Value: 641 Ohm
Lead Channel Pacing Threshold Amplitude: 0.5 V
Lead Channel Pacing Threshold Amplitude: 0.625 V
Lead Channel Pacing Threshold Pulse Width: 0.4 ms
Lead Channel Pacing Threshold Pulse Width: 0.4 ms
Lead Channel Setting Pacing Amplitude: 2 V
Lead Channel Setting Pacing Amplitude: 2.5 V
Lead Channel Setting Pacing Pulse Width: 0.4 ms
Lead Channel Setting Sensing Sensitivity: 5.6 mV

## 2020-05-31 ENCOUNTER — Other Ambulatory Visit: Payer: Self-pay

## 2020-05-31 ENCOUNTER — Emergency Department (HOSPITAL_COMMUNITY)
Admission: EM | Admit: 2020-05-31 | Discharge: 2020-06-01 | Disposition: A | Discharge status: Home / Self Care | Payer: Medicare Other | Attending: Emergency Medicine | Admitting: Emergency Medicine

## 2020-05-31 ENCOUNTER — Emergency Department (HOSPITAL_COMMUNITY): Payer: Medicare Other

## 2020-05-31 DIAGNOSIS — R519 Headache, unspecified: Secondary | ICD-10-CM | POA: Insufficient documentation

## 2020-05-31 DIAGNOSIS — R55 Syncope and collapse: Secondary | ICD-10-CM | POA: Diagnosis not present

## 2020-05-31 DIAGNOSIS — W010XXA Fall on same level from slipping, tripping and stumbling without subsequent striking against object, initial encounter: Secondary | ICD-10-CM | POA: Insufficient documentation

## 2020-05-31 DIAGNOSIS — S0101XA Laceration without foreign body of scalp, initial encounter: Secondary | ICD-10-CM | POA: Diagnosis not present

## 2020-05-31 DIAGNOSIS — S0181XA Laceration without foreign body of other part of head, initial encounter: Secondary | ICD-10-CM | POA: Insufficient documentation

## 2020-05-31 DIAGNOSIS — Z96643 Presence of artificial hip joint, bilateral: Secondary | ICD-10-CM | POA: Insufficient documentation

## 2020-05-31 DIAGNOSIS — R569 Unspecified convulsions: Secondary | ICD-10-CM

## 2020-05-31 DIAGNOSIS — Z950 Presence of cardiac pacemaker: Secondary | ICD-10-CM | POA: Diagnosis not present

## 2020-05-31 DIAGNOSIS — Z79899 Other long term (current) drug therapy: Secondary | ICD-10-CM | POA: Diagnosis not present

## 2020-05-31 LAB — CBC
HCT: 38.9 % (ref 36.0–46.0)
Hemoglobin: 12.8 g/dL (ref 12.0–15.0)
MCH: 30.3 pg (ref 26.0–34.0)
MCHC: 32.9 g/dL (ref 30.0–36.0)
MCV: 92 fL (ref 80.0–100.0)
Platelets: 224 10*3/uL (ref 150–400)
RBC: 4.23 MIL/uL (ref 3.87–5.11)
RDW: 14.1 % (ref 11.5–15.5)
WBC: 5.2 10*3/uL (ref 4.0–10.5)
nRBC: 0 % (ref 0.0–0.2)

## 2020-05-31 LAB — CBG MONITORING, ED: Glucose-Capillary: 86 mg/dL (ref 70–99)

## 2020-05-31 LAB — BASIC METABOLIC PANEL
Anion gap: 10 (ref 5–15)
BUN: 10 mg/dL (ref 8–23)
CO2: 24 mmol/L (ref 22–32)
Calcium: 9.4 mg/dL (ref 8.9–10.3)
Chloride: 96 mmol/L — ABNORMAL LOW (ref 98–111)
Creatinine, Ser: 0.85 mg/dL (ref 0.44–1.00)
GFR, Estimated: >60 mL/min (ref >60)
Glucose, Bld: 118 mg/dL — ABNORMAL HIGH (ref 70–99)
Potassium: 4 mmol/L (ref 3.5–5.1)
Sodium: 130 mmol/L — ABNORMAL LOW (ref 135–145)

## 2020-05-31 NOTE — ED Provider Notes (Signed)
[facility] Provider Note   CSN: [id number] Arrival date & time: 05/31/20  1559     History Chief Complaint  Patient presents with  . Seizures  . Fall    [name] [name] is a 65 y.o. female w/ hx of seizures, syncope s/p pacemaker placement, presenting to ED with seizure activity in a [employer] parking lot.  Reportedly had 2-5 minutes of witnessed tonic-clonic activity in parking lot, striking her head on the ground.  Daughter did not witness the events but reports a second hand.  Patient is not on blood thinners.    According to the patient and her daughter, [name] [name] has breakthrough seizure type episode, or syncopal episode, once every few months.  [name] [name] has been compliant with all of her medications.  [name] [name] is deaf.  Her daughter provides signed language services and the patient can read lips.  Last seen by Dr [name] neurology on 10/26/2, per his note pt has hx of left temporal lobe epilepsy.  Current medications: Lamotrigine ER 300 mg twice daily, oxcarbazepine 300 mg in a.m. and 600 mg in p.m.  Prior studies: EEG: focal slowing and frequent spike and wave activity in left anterior temporal lobe. MRI Brain (04/13/97): focal area of encephalomalacia in posterior left temporal lobe. MRI Brain (10/19/08): possible left mesial temporal sclerosis and lesion in left posterior temporal lobe.   HPI     Past Medical History:  Diagnosis Date  . Chicken pox   . Conductive hearing loss, childhood onset   . 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 Epilepsy, followed by Dr. Tomma Rakers in neurology 08/26/2012  . Hx of adenomatous colonic polyps 11/19/2016  . Pacemaker   . Pacemaker    2012  . Seizure ([place])   . Seizures ([place])    Petite Mal     Patient Active Problem List   Diagnosis Date Noted  . 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 Hx of adenomatous colonic polyps 11/19/2016  . Localization-related idiopathic epilepsy and epileptic  syndromes with seizures of localized onset, not intractable, without status epilepticus ([place]) 01/01/2015  . 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 Dizziness and giddiness 01/01/2015  . Partial epilepsy with impairment of consciousness ([place]) 09/12/2013  . 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 Deaf 08/26/2012  . Migraine 08/26/2012  . Sinus node dysfunction ([place]) 08/26/2012  . H/O bilateral hip replacements 08/26/2012    Past Surgical History:  Procedure Laterality Date  . HIP ARTHROPLASTY    . PACEMAKER INSERTION  07/2010  . TOTAL HIP ARTHROPLASTY  07/2011   right  . TOTAL HIP ARTHROPLASTY  03/2012   left     OB History    Gravida  0   Para  0   Term  0   Preterm  0   AB  0   Living        SAB  0   IAB  0   Ectopic  0   Multiple      Live Births              Family History  Problem Relation Age of Onset  . Diabetes Father   . Hypertension Father   . Colon cancer Father 29  . Hypertension Mother   . Other Mother        skin lesions  . Diabetes Mother        may be borderline  . Healthy Brother   . Healthy  Brother   . Esophageal cancer Neg Hx   . Rectal cancer Neg Hx   . Stomach cancer Neg Hx     Social History   Tobacco Use  . Smoking status: Never Smoker  . Smokeless tobacco: Never Used  Vaping Use  . Vaping Use: Never used  Substance Use Topics  . Alcohol use: No    Alcohol/week: 0.0 standard drinks    Comment: none  . Drug use: No    Home Medications Prior to Admission medications   Medication Sig Start Date End Date Taking? Authorizing Provider  Ascorbic Acid (VITAMIN C PO) Take 1 tablet by mouth daily.     [provider]  Cholecalciferol (VITAMIN D) 2000 UNITS CAPS Take by mouth 2 (two) times daily.    [provider]  fish oil-omega-3 fatty acids 1000 MG capsule Take 2 g by mouth 2 (two) times daily.    [provider]  folic acid (FOLVITE) 1 MG tablet Take 1 mg by mouth daily.    [provider]   LamoTRIgine 300 MG TB24 24 hour tablet Take 1 tablet by mouth twice daily 04/20/20   Metta Clines R, DO  magnesium 30 MG tablet Take 30 mg by mouth daily.     [provider]  Multiple Vitamin (MULTIVITAMIN WITH MINERALS) TABS tablet Take 1 tablet by mouth daily.    [provider]  Oxcarbazepine (TRILEPTAL) 300 MG tablet TAKE 1 TABLET BY MOUTH IN THE MORNING 2 IN THE EVENING 05/03/20   Pieter Partridge, DO    Allergies    Patient has no known allergies.  Review of Systems   Review of Systems  Constitutional: Negative for chills and fever.  Eyes: Negative for pain and visual disturbance.  Respiratory: Negative for cough and shortness of breath.   Cardiovascular: Negative for chest pain and palpitations.  Gastrointestinal: Negative for abdominal pain and vomiting.  Musculoskeletal: Negative for arthralgias and back pain.  Skin: Negative for color change and rash.  Neurological: Positive for headaches. Negative for syncope and light-headedness.  All other systems reviewed and are negative.   Physical Exam Updated Vital Signs BP (!) 180/81   Pulse (!) 59   Temp 98.6 F (37 C) (Oral)   Resp 18   Ht 5\' 3"  (1.6 m)   Wt 76.7 kg   LMP 08/29/2005   SpO2 100%   BMI 29.94 kg/m   Physical Exam Constitutional:      General: [name] [name] is not in acute distress.    Comments: Deaf  HENT:     Head: Normocephalic.     Comments: Laceration to occiput Eyes:     Conjunctiva/sclera: Conjunctivae normal.     Pupils: Pupils are equal, round, and reactive to light.  Cardiovascular:     Rate and Rhythm: Normal rate and regular rhythm.  Pulmonary:     Effort: Pulmonary effort is normal. No respiratory distress.  Abdominal:     General: There is no distension.     Tenderness: There is no abdominal tenderness.  Skin:    General: Skin is warm and dry.  Neurological:     General: No focal deficit present.     Mental Status: [name] [name] is alert. Mental status is at baseline.  Psychiatric:         Mood and Affect: Mood normal.        Behavior: Behavior normal.     ED Results / Procedures / Treatments   Labs (all labs ordered are  listed, but only abnormal results are displayed) Labs Reviewed  BASIC METABOLIC PANEL - Abnormal; Notable for the following components:      Result Value   Sodium 130 (*)    Chloride 96 (*)    Glucose, Bld 118 (*)    All other components within normal limits  CBC  CBG MONITORING, ED    EKG None  Radiology CT HEAD WO CONTRAST  Result Date: 05/31/2020 CLINICAL DATA:  65 year old female with seizure. EXAM: CT HEAD WITHOUT CONTRAST TECHNIQUE: Contiguous axial images were obtained from the base of the skull through the vertex without intravenous contrast. COMPARISON:  Head CT dated 03/29/2014. FINDINGS: Brain: The ventricles and sulci appropriate size for patient's age. 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 There is no acute intracranial hemorrhage. No mass effect midline shift no extra-axial fluid collection. Vascular: No hyperdense vessel or unexpected calcification. Skull: Normal. Negative for fracture or focal lesion. Sinuses/Orbits: No acute finding. Other: Right posterior parietal scalp laceration and small hematoma. IMPRESSION: 1. No acute intracranial pathology. 2. 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 Electronically Signed   By: Anner Crete M.D.   On: 05/31/2020 16:45    Procedures Procedures   Medications Ordered in ED Medications - No data to display  ED Course  I have reviewed the triage vital signs and the nursing notes.  Pertinent labs & imaging results that were available during my care of the patient were reviewed by me and considered in my medical decision making (see chart for details).  65 yo female here with seizure vs syncope vs drop-spell vs other Symptomatic and back to baseline now Hx of breakthrough seizures, compliant with seizure meds  - Labs unremarkable here - CTH with no intracranial injury -  ECG per my interpretation shows NSR.  Plan on interrogation of medtronic device for arrhythmia evaluation.  Nursing notified.  - Laceration on occiput is stellate with very friable edges, unfortunately very difficult to juxtopose the edges.  This was irrigated and cleaned.  No active bleeding.  I explained to her and her daughter that this does not appear amenable to sutures to staples.  As the wounds are roughly approxiameted already, I can apply dermabond and a sterile covering.   I will refer them to the wound clinic for monitoring of the healing  - Neurology consulted as noted below - Additional hx from pt's daughter - Outpatient medical records from Dr [name] reviewed   Clinical Course as of 06/01/20 0016  Mon May 31, 2020  2019 Patient not in room yet [MT]  2119 Patient evaluated, stable, daughter at bedside.   [MT]  2153 I spoke to neurologist Dr Lorrin Goodell who reviewed her case, and advised no changes in chronic seizure medications, but agreed with cardiac evaluation and interrogation for possible "drop spell."  He otherwise advised outpatient neurology follow up. [MT]  2321 Signed out to Dr Sedonia Small pending Medtronic tech driving in to interrogate device (unable to send electronically for unclear reason).  If no cardiac events tonight, stable for discharge home with daughter.  I applied dermabond and a dressing to her scalp wound, and advised wound care follow up given the location of her wound. [MT]    Clinical Course User Index [MT] Kimba Lottes, Carola Rhine, MD    Final Clinical Impression(s) / ED Diagnoses Final diagnoses:  None    Rx / DC Orders ED Discharge Orders    None       Elizar Alpern, Carola Rhine, MD 06/01/20 [phone]

## 2020-05-31 NOTE — Discharge Instructions (Addendum)
You were seen in the ER for a possible seizure episode and a head injury.  Your CT scan did not show signs of brain bleeding.  We spoke to our neurologist who did recommended leaving your current seizure medications as they are.  Please call [name] office to arrange for outpatient follow up.  You also have a difficult wound on the top of your head.  Because it is uneven with thin skin, I could not close this wound with staples.  I put glue on the wound to form a bandage.  Keep this wound covered and bandaged for 2 days.  Afterwards you can shower, but avoid scrubbing this part of your hair.  The glue will naturally dissolve in 1-2 weeks.  This wound has a difficult time healing.  You should make an appointment with our wound care doctor at the number circled above.

## 2020-05-31 NOTE — ED Notes (Signed)
Pt pacemaker synced with Medtronic waiting report.

## 2020-05-31 NOTE — ED Triage Notes (Signed)
Pt had witnessed 2-5 minute seizure in [employer] parking lot, falling and striking her head on the asphalt. Lac to head, bleeding controlled. Not on blood thinners. Hx of same but has not had a seizure in several months, compliant with medication. Denies neck or back pain. Pt is deaf.

## 2020-05-31 NOTE — ED Notes (Signed)
This RN reached out to Medtronic costumer support in regards to blue-tooth issue, awaiting call from Medtr[employer] representative.

## 2020-06-01 NOTE — ED Notes (Signed)
Dressing applied to pt scalp and head wrapped per provider order post dermabond to scalp laceration.

## 2020-06-01 NOTE — ED Provider Notes (Signed)
  Provider Note MRN:  [id number]  Arrival date & time: 06/01/20    ED Course and Medical Decision Making  Assumed care from Dr. Langston Masker at shift change.  Suspect breakthrough seizure though patient has pacemaker.  Medtronic interrogation is reassuring, no events or episodes.  Patient is appropriate for discharge.  Procedures  Final Clinical Impressions(s) / ED Diagnoses     ICD-10-CM   1. Seizure ([place])  R56.9   2. Laceration of scalp without foreign body, initial encounter  S01.[place]     ED Discharge Orders    None        Discharge Instructions     You were seen in the ER for a possible seizure episode and a head injury.  Your CT scan did not show signs of brain bleeding.  We spoke to our neurologist who did recommended leaving your current seizure medications as they are.  Please call [name] office to arrange for outpatient follow up.  You also have a difficult wound on the top of your head.  Because it is uneven with thin skin, I could not close this wound with staples.  I put glue on the wound to form a bandage.  Keep this wound covered and bandaged for 2 days.  Afterwards you can shower, but avoid scrubbing this part of your hair.  The glue will naturally dissolve in 1-2 weeks.  This wound has a difficult time healing.  You should make an appointment with our wound care doctor at the number circled above.    Barth Kirks. Sedonia Small, [place] [email or web address]    Maudie Flakes, MD 06/01/20 Shelah Lewandowsky

## 2020-06-04 ENCOUNTER — Telehealth: Payer: Self-pay | Admitting: Neurology

## 2020-06-04 NOTE — Telephone Encounter (Signed)
Called and left a message with Blanch Media to give [place] a call back to schedule.

## 2020-06-04 NOTE — Telephone Encounter (Signed)
Patient's mother called in wanting to schedule and appointment for Dr. [name] to see the patient after she had fallen in the grocery store parking lot on Monday afternoon due to a possible seizure. She had gone to the emergency department and they wanted her to follow up with Dr. [name]. The next available appointment is in August, she did not want her to wait that long. Should the patient be worked in?

## 2020-06-04 NOTE — Telephone Encounter (Signed)
Spoke to Dr. [name] and he stated that patient can be worked in. He stated that within a month is ok to work patient in.

## 2020-06-07 NOTE — Progress Notes (Signed)
Remote pacemaker transmission.   

## 2020-06-16 DIAGNOSIS — M19011 Primary osteoarthritis, right shoulder: Secondary | ICD-10-CM | POA: Diagnosis not present

## 2020-06-17 ENCOUNTER — Other Ambulatory Visit: Payer: Self-pay | Admitting: Orthopedic Surgery

## 2020-06-17 DIAGNOSIS — Z01811 Encounter for preprocedural respiratory examination: Secondary | ICD-10-CM

## 2020-06-25 ENCOUNTER — Telehealth: Payer: Self-pay | Admitting: *Deleted

## 2020-06-25 NOTE — Telephone Encounter (Signed)
   Primary Cardiologist: Dr. [name]  Chart reviewed as part of pre-operative protocol coverage. Because of [name] [name]'s past medical history and time since last visit, she will require a follow-up visit in order to better assess preoperative cardiovascular risk.  Pre-op covering staff: - Please schedule appointment and call patient to inform them. If patient already had an upcoming appointment within acceptable timeframe, please add "pre-op clearance" to the appointment notes so provider is aware. - Please contact requesting surgeon's office via preferred method (i.e, phone, fax) to inform them of need for appointment prior to surgery.  If applicable, this message will also be routed to pharmacy pool and/or primary cardiologist for input on holding anticoagulant/antiplatelet agent as requested below so that this information is available to the clearing provider at time of patient's appointment.   [place], [place]  06/25/2020, 10:28 AM

## 2020-06-25 NOTE — Telephone Encounter (Signed)
   [facility] Pre-operative Risk Assessment    HEARTCARE STAFF: - Please ensure there is not already an duplicate clearance open for this procedure. - Under Visit Info/Reason for Call, type in Other and utilize the format Clearance MM/DD/YY or Clearance TBD. Do not use dashes or single digits. - If request is for dental extraction, please clarify the # of teeth to be extracted.  Request for surgical clearance:  1. What type of surgery is being performed? RIGHT TOTAL SHOULDER ARTHROPLASTY   2. When is this surgery scheduled? 07/15/20   3. What type of clearance is required (medical clearance vs. Pharmacy clearance to hold med vs. Both)? MEDICAL  4. Are there any medications that need to be held prior to surgery and how long? NONE LISTED   5. Practice name and name of physician performing surgery? [facility]; DR. Larkin Ina CHANDLER   6. What is the office phone number? [phone]   7.   What is the office fax number? [phone]  8.   Anesthesia type (None, local, MAC, general) ? CHOICE   [name] [name] 06/25/2020, 9:47 AM  _________________________________________________________________   (provider comments below)

## 2020-06-25 NOTE — Telephone Encounter (Signed)
Message sent to [place], [place] scheduler to set up pre op appt. Pt will need Interpreter for Sign Language.

## 2020-06-25 NOTE — Telephone Encounter (Signed)
Per [place], EP scheduler she has left a message on the mobile number and the home number says call can't be completed at this time.

## 2020-06-28 ENCOUNTER — Other Ambulatory Visit: Payer: Self-pay | Admitting: Orthopedic Surgery

## 2020-06-28 DIAGNOSIS — M19011 Primary osteoarthritis, right shoulder: Secondary | ICD-10-CM

## 2020-06-28 NOTE — Telephone Encounter (Signed)
Pt is scheduled for 07/09/20 with Tommye Standard, [facility] for pre op clearance. I will forward notes to [facility] for upcoming appt. Will send FYI to requesting office pt has appt 07/09/20.

## 2020-06-30 NOTE — Telephone Encounter (Signed)
Called back and left a message on the patient's mobile number to give [place] a call back to schedule.

## 2020-07-06 NOTE — Progress Notes (Signed)
DUE TO COVID-19 ONLY ONE VISITOR IS ALLOWED TO COME WITH YOU AND STAY IN THE WAITING ROOM ONLY DURING PRE OP AND PROCEDURE DAY OF SURGERY. THE 1 VISITOR  MAY VISIT WITH YOU AFTER SURGERY IN YOUR PRIVATE ROOM DURING VISITING HOURS ONLY!  YOU NEED TO HAVE A COVID 19 TEST ON__3/14/22_____ @_______ , THIS TEST MUST BE DONE BEFORE SURGERY,  COVID TESTING SITE [address] [place] [address], IT IS ON THE RIGHT GOING OUT [address] APPROXIMATELY  2 MINUTES PAST [employer] ON THE RIGHT. ONCE YOUR COVID TEST IS COMPLETED,  PLEASE BEGIN THE QUARANTINE INSTRUCTIONS AS OUTLINED IN YOUR HANDOUT.                [name] [name]  07/06/2020   Your procedure is scheduled on: 07/15/20   Report to [facility] Main  Entrance   Report to admitting at    0930 AM     Call this number if you have problems the morning of surgery [phone]    REMEMBER: NO  SOLID FOOD CANDY OR GUM AFTER MIDNIGHT. CLEAR LIQUIDS UNTIL0900am          . NOTHING BY MOUTH EXCEPT CLEAR LIQUIDS UNTIL    . PLEASE FINISH ENSURE DRINK PER SURGEON ORDER  WHICH NEEDS TO BE COMPLETED AT 0900am   .      CLEAR LIQUID DIET   Foods Allowed                                                                    Coffee and tea, regular and decaf                            Fruit ices (not with fruit pulp)                                      Iced Popsicles                                    Carbonated beverages, regular and diet                                    Cranberry, grape and apple juices Sports drinks like Gatorade Lightly seasoned clear broth or consume(fat free) Sugar, honey syrup ___________________________________________________________________      BRUSH YOUR TEETH MORNING OF SURGERY AND RINSE YOUR MOUTH OUT, NO CHEWING GUM CANDY OR MINTS.     Take these medicines the morning of surgery with A SIP OF WATER: lamictal, trileptal   DO NOT TAKE ANY DIABETIC MEDICATIONS DAY OF YOUR SURGERY                                You may not have any metal on your body including hair pins and              piercings  Do not wear jewelry, make-up, lotions, powders  or perfumes, deodorant             Do not wear nail polish on your fingernails.  Do not shave  48 hours prior to surgery.              Men may shave face and neck.   Do not bring valuables to the hospital. [place].  Contacts, dentures or bridgework may not be worn into surgery.  Leave suitcase in the car. After surgery it may be brought to your room.     Patients discharged the day of surgery will not be allowed to drive home. IF YOU ARE HAVING SURGERY AND GOING HOME THE SAME DAY, YOU MUST HAVE AN ADULT TO DRIVE YOU HOME AND BE WITH YOU FOR 24 HOURS. YOU MAY GO HOME BY TAXI OR UBER OR ORTHERWISE, BUT AN ADULT MUST ACCOMPANY YOU HOME AND STAY WITH YOU FOR 24 HOURS.  Name and phone number of your driver:  Special Instructions: N/A              Please read over the following fact sheets you were given: _____________________________________________________________________  [facility] - Preparing for Surgery Before surgery, you can play an important role.  Because skin is not sterile, your skin needs to be as free of germs as possible.  You can reduce the number of germs on your skin by washing with CHG (chlorahexidine gluconate) soap before surgery.  CHG is an antiseptic cleaner which kills germs and bonds with the skin to continue killing germs even after washing. Please DO NOT use if you have an allergy to CHG or antibacterial soaps.  If your skin becomes reddened/irritated stop using the CHG and inform your nurse when you arrive at Short Stay. Do not shave (including legs and underarms) for at least 48 hours prior to the first CHG shower.  You may shave your face/neck. Please follow these instructions carefully:  1.  Shower with CHG Soap the night before surgery and the  morning of  Surgery.  2.  If you choose to wash your hair, wash your hair first as usual with your  normal  shampoo.  3.  After you shampoo, rinse your hair and body thoroughly to remove the  shampoo.                           4.  Use CHG as you would any other liquid soap.  You can apply chg directly  to the skin and wash                       Gently with a scrungie or clean washcloth.  5.  Apply the CHG Soap to your body ONLY FROM THE NECK DOWN.   Do not use on face/ open                           Wound or open sores. Avoid contact with eyes, ears mouth and genitals (private parts).                       Wash face,  Genitals (private parts) with your normal soap.             6.  Wash thoroughly, paying special attention to the area  where your surgery  will be performed.  7.  Thoroughly rinse your body with warm water from the neck down.  8.  DO NOT shower/wash with your normal soap after using and rinsing off  the CHG Soap.                9.  Pat yourself dry with a clean towel.            10.  Wear clean pajamas.            11.  Place clean sheets on your bed the night of your first shower and do not  sleep with pets. Day of Surgery : Do not apply any lotions/deodorants the morning of surgery.  Please wear clean clothes to the hospital/surgery center.  FAILURE TO FOLLOW THESE INSTRUCTIONS MAY RESULT IN THE CANCELLATION OF YOUR SURGERY PATIENT SIGNATURE_________________________________  NURSE SIGNATURE__________________________________  ________________________________________________________________________             [facility]- Preparing for Total Shoulder Arthroplasty    Before surgery, you can play an important role. Because skin is not sterile, your skin needs to be as free of germs as possible. You can reduce the number of germs on your skin by using the following products. . Benzoyl Peroxide Gel o Reduces the number of germs present on the skin o Applied twice a day to shoulder area  starting two days before surgery    ==================================================================  Please follow these instructions carefully:  BENZOYL PEROXIDE 5% GEL  Please do not use if you have an allergy to benzoyl peroxide.   If your skin becomes reddened/irritated stop using the benzoyl peroxide.  Starting two days before surgery, apply as follows: 1. Apply benzoyl peroxide in the morning and at night. Apply after taking a shower. If you are not taking a shower clean entire shoulder front, back, and side along with the armpit with a clean wet washcloth.  2. Place a quarter-sized dollop on your shoulder and rub in thoroughly, making sure to cover the front, back, and side of your shoulder, along with the armpit.   2 days before ____ AM   ____ PM              1 day before ____ AM   ____ PM                         3. Do this twice a day for two days.  (Last application is the night before surgery, AFTER using the CHG soap as described below).  4. Do NOT apply benzoyl peroxide gel on the day of surgery.

## 2020-07-06 NOTE — Progress Notes (Signed)
DUE TO COVID-19 ONLY ONE VISITOR IS ALLOWED TO COME WITH YOU AND STAY IN THE WAITING ROOM ONLY DURING PRE OP AND PROCEDURE DAY OF SURGERY. THE 1 VISITOR  MAY VISIT WITH YOU AFTER SURGERY IN YOUR PRIVATE ROOM DURING VISITING HOURS ONLY!  YOU NEED TO HAVE A COVID 19 TEST ON__3/14/2022 _____ @_______ , THIS TEST MUST BE DONE BEFORE SURGERY,  COVID TESTING SITE [address] [place] [address], IT IS ON THE RIGHT GOING OUT [address] APPROXIMATELY  2 MINUTES PAST [employer] ON THE RIGHT. ONCE YOUR COVID TEST IS COMPLETED,  PLEASE BEGIN THE QUARANTINE INSTRUCTIONS AS OUTLINED IN YOUR HANDOUT.                Keonda Dow  07/06/2020   Your procedure is scheduled on: 07/15/2020    Report to [facility] Main  Entrance   Report to admitting at   0930AM      Call this number if you have problems the morning of surgery [phone]    REMEMBER: NO  SOLID FOOD CANDY OR GUM AFTER MIDNIGHT. CLEAR LIQUIDS UNTIL  0900AM        . NOTHING BY MOUTH EXCEPT CLEAR LIQUIDS UNTIL    . PLEASE FINISH ENSURE DRINK PER SURGEON ORDER  WHICH NEEDS TO BE COMPLETED AT  0900AM  .      CLEAR LIQUID DIET   Foods Allowed                                                                    Coffee and tea, regular and decaf                            Fruit ices (not with fruit pulp)                                      Iced Popsicles                                    Carbonated beverages, regular and diet                                    Cranberry, grape and apple juices Sports drinks like Gatorade Lightly seasoned clear broth or consume(fat free) Sugar, honey syrup ___________________________________________________________________      BRUSH YOUR TEETH MORNING OF SURGERY AND RINSE YOUR MOUTH OUT, NO CHEWING GUM CANDY OR MINTS.     Take these medicines the morning of surgery with A SIP OF WATER: LAMICTAL, TRILEPTAL   DO NOT TAKE ANY DIABETIC MEDICATIONS DAY OF YOUR SURGERY                                You may not have any metal on your body including hair pins and              piercings  Do not wear jewelry, make-up, lotions,  powders or perfumes, deodorant             Do not wear nail polish on your fingernails.  Do not shave  48 hours prior to surgery.              Men may shave face and neck.   Do not bring valuables to the hospital. [place].  Contacts, dentures or bridgework may not be worn into surgery.  Leave suitcase in the car. After surgery it may be brought to your room.     Patients discharged the day of surgery will not be allowed to drive home. IF YOU ARE HAVING SURGERY AND GOING HOME THE SAME DAY, YOU MUST HAVE AN ADULT TO DRIVE YOU HOME AND BE WITH YOU FOR 24 HOURS. YOU MAY GO HOME BY TAXI OR UBER OR ORTHERWISE, BUT AN ADULT MUST ACCOMPANY YOU HOME AND STAY WITH YOU FOR 24 HOURS.  Name and phone number of your driver:  Special Instructions: N/A              Please read over the following fact sheets you were given: _____________________________________________________________________  [facility] - Preparing for Surgery Before surgery, you can play an important role.  Because skin is not sterile, your skin needs to be as free of germs as possible.  You can reduce the number of germs on your skin by washing with CHG (chlorahexidine gluconate) soap before surgery.  CHG is an antiseptic cleaner which kills germs and bonds with the skin to continue killing germs even after washing. Please DO NOT use if you have an allergy to CHG or antibacterial soaps.  If your skin becomes reddened/irritated stop using the CHG and inform your nurse when you arrive at Short Stay. Do not shave (including legs and underarms) for at least 48 hours prior to the first CHG shower.  You may shave your face/neck. Please follow these instructions carefully:  1.  Shower with CHG Soap the night before surgery and the  morning of  Surgery.  2.  If you choose to wash your hair, wash your hair first as usual with your  normal  shampoo.  3.  After you shampoo, rinse your hair and body thoroughly to remove the  shampoo.                           4.  Use CHG as you would any other liquid soap.  You can apply chg directly  to the skin and wash                       Gently with a scrungie or clean washcloth.  5.  Apply the CHG Soap to your body ONLY FROM THE NECK DOWN.   Do not use on face/ open                           Wound or open sores. Avoid contact with eyes, ears mouth and genitals (private parts).                       Wash face,  Genitals (private parts) with your normal soap.             6.  Wash thoroughly, paying special attention to the  area where your surgery  will be performed.  7.  Thoroughly rinse your body with warm water from the neck down.  8.  DO NOT shower/wash with your normal soap after using and rinsing off  the CHG Soap.                9.  Pat yourself dry with a clean towel.            10.  Wear clean pajamas.            11.  Place clean sheets on your bed the night of your first shower and do not  sleep with pets. Day of Surgery : Do not apply any lotions/deodorants the morning of surgery.  Please wear clean clothes to the hospital/surgery center.  FAILURE TO FOLLOW THESE INSTRUCTIONS MAY RESULT IN THE CANCELLATION OF YOUR SURGERY PATIENT SIGNATURE_________________________________  NURSE SIGNATURE__________________________________  ________________________________________________________________________   [facility]- Preparing for Total Shoulder Arthroplasty    Before surgery, you can play an important role. Because skin is not sterile, your skin needs to be as free of germs as possible. You can reduce the number of germs on your skin by using the following products. . Benzoyl Peroxide Gel o Reduces the number of germs present on the skin o Applied twice a day to shoulder area starting two  days before surgery    ==================================================================  Please follow these instructions carefully:  BENZOYL PEROXIDE 5% GEL  Please do not use if you have an allergy to benzoyl peroxide.   If your skin becomes reddened/irritated stop using the benzoyl peroxide.  Starting two days before surgery, apply as follows: 1. Apply benzoyl peroxide in the morning and at night. Apply after taking a shower. If you are not taking a shower clean entire shoulder front, back, and side along with the armpit with a clean wet washcloth.  2. Place a quarter-sized dollop on your shoulder and rub in thoroughly, making sure to cover the front, back, and side of your shoulder, along with the armpit.   2 days before ____ AM   ____ PM              1 day before ____ AM   ____ PM                         3. Do this twice a day for two days.  (Last application is the night before surgery, AFTER using the CHG soap as described below).  4. Do NOT apply benzoyl peroxide gel on the day of surgery.

## 2020-07-07 ENCOUNTER — Other Ambulatory Visit: Payer: Self-pay

## 2020-07-07 ENCOUNTER — Ambulatory Visit (INDEPENDENT_AMBULATORY_CARE_PROVIDER_SITE_OTHER): Payer: Medicare Other | Admitting: Family Medicine

## 2020-07-07 ENCOUNTER — Encounter: Payer: Self-pay | Admitting: Family Medicine

## 2020-07-07 VITALS — BP 140/68 | HR 67 | Temp 97.5°F | Ht 63.0 in | Wt 172.7 lb

## 2020-07-07 DIAGNOSIS — I10 Essential (primary) hypertension: Secondary | ICD-10-CM | POA: Diagnosis not present

## 2020-07-07 DIAGNOSIS — G40209 Localization-related (focal) (partial) symptomatic epilepsy and epileptic syndromes with complex partial seizures, not intractable, without status epilepticus: Secondary | ICD-10-CM | POA: Diagnosis not present

## 2020-07-07 MED ORDER — AMLODIPINE BESYLATE 2.5 MG PO TABS
2.5000 mg | ORAL_TABLET | Freq: Every day | ORAL | 1 refills | Status: DC
Start: 2020-07-07 — End: 2020-07-09

## 2020-07-07 NOTE — Progress Notes (Signed)
[name] [name] DOB: January 05, 1956 Encounter date: 07/07/2020  This is a 65 y.o. female who presents with Chief Complaint  Patient presents with  . Follow-up   Entire visit was completed with interpreter.  History of present illness: She is scheduled for a total shoulder 3/17.  She had a visit set up with cardiology on 3/11 for cardiac evaluation prior to surgery.   She had a headache this morning. Sometimes she gets hormonal changes like hot flashes that can cause headaches. Gets flashes, headaches about every few weeks. Usually takes advil or tylenol for headache which usually works.   She wants to make sure that heart is healthy for upcoming surgery.   Jan 31st fell and hit head after getting dizzy spell. Would have eipsodes with feeling dizzy and had to brace herself, but 31st was first time she fell; when she couldn't brace self. Was rare up to that time.   She did have all 3 of her COVID vaccinations.   No chest pain, pressure, no troubles with breathing. Not checking bp at home at all.   Has had a little nasal congestion; took mucinex for this but that was a month ago and she has no symptoms today.  My recheck bp in office 150/82 bilateral. She doesn't feel anxious at the doc office.   No Known Allergies Current Meds  Medication Sig  . acetaminophen (TYLENOL) 325 MG tablet Take 650 mg by mouth every 6 (six) hours as needed for headache or moderate pain.  . Ascorbic Acid (VITAMIN C PO) Take 1 tablet by mouth daily.   . Cholecalciferol (VITAMIN D) 2000 UNITS CAPS Take 2,000 Units by mouth 2 (two) times daily.  . folic acid (FOLVITE) 1 MG tablet Take 1 mg by mouth daily.  . LamoTRIgine 300 MG TB24 24 hour tablet Take 1 tablet by mouth twice daily (Patient taking differently: Take 300 mg by mouth 2 (two) times daily.)  . magnesium 30 MG tablet Take 30 mg by mouth daily.   . Multiple Vitamin (MULTIVITAMIN WITH MINERALS) TABS tablet Take 2 tablets by mouth daily.  . Oxcarbazepine  (TRILEPTAL) 300 MG tablet TAKE 1 TABLET BY MOUTH IN THE MORNING 2 IN THE EVENING (Patient taking differently: Take 300-600 mg by mouth See admin instructions. Take 300 mg by mouth in the morning and 600 mg in the evening)    Review of Systems  Constitutional: Negative for activity change, appetite change, chills, fatigue, fever and unexpected weight change.  HENT: Negative for congestion, ear pain, hearing loss, sinus pressure, sinus pain, sore throat and trouble swallowing.   Eyes: Negative for pain and visual disturbance.  Respiratory: Negative for cough, chest tightness, shortness of breath and wheezing.   Cardiovascular: Negative for chest pain, palpitations and leg swelling.  Gastrointestinal: Negative for abdominal pain, blood in stool, constipation, diarrhea, nausea and vomiting.  Genitourinary: Negative for difficulty urinating and menstrual problem.  Musculoskeletal: Negative for arthralgias and back pain.  Skin: Negative for rash.  Neurological: Negative for dizziness, weakness, numbness and headaches.  Hematological: Negative for adenopathy. Does not bruise/bleed easily.  Psychiatric/Behavioral: Negative for sleep disturbance and suicidal ideas. The patient is not nervous/anxious.     Objective:  BP 140/68 (BP Location: Left Arm, Patient Position: Sitting, Cuff Size: Large)   Pulse 67   Temp (!) 97.5 F (36.4 C) (Oral)   Ht 5\' 3"  (1.6 m)   Wt 172 lb 11.2 oz (78.3 kg)   LMP 08/29/2005   SpO2 96%   BMI  30.59 kg/m   Weight: 172 lb 11.2 oz (78.3 kg)   BP Readings from Last 3 Encounters:  07/07/20 140/68  06/01/20 (!) 149/76  02/24/20 (!) 169/96   Wt Readings from Last 3 Encounters:  07/07/20 172 lb 11.2 oz (78.3 kg)  05/31/20 169 lb (76.7 kg)  02/24/20 169 lb (76.7 kg)    Physical Exam Constitutional:      General: She is not in acute distress.    Appearance: She is well-developed.  Cardiovascular:     Rate and Rhythm: Normal rate and regular rhythm.     Heart  sounds: Normal heart sounds. No murmur heard. No friction rub.  Pulmonary:     Effort: Pulmonary effort is normal. No respiratory distress.     Breath sounds: Normal breath sounds. No wheezing or rales.  Musculoskeletal:     Right lower leg: No edema.     Left lower leg: No edema.  Neurological:     Mental Status: She is alert and oriented to person, place, and time.  Psychiatric:        Behavior: Behavior normal.     Assessment/Plan    1. Partial epilepsy with impairment of consciousness ([place]) Continue with current medications. Follows regularly with neurology.   2. Hypertension, unspecified type She has been consistently elevated. I am going to add low dose amlodipine. She has follow up with cardiology on Friday for clearance for surgery. Discussed new medication(s) today with patient. Discussed potential side effects and patient verbalized understanding.  Return in about 3 months (around 10/07/2020) for Chronic condition visit.     Micheline Rough, MD

## 2020-07-07 NOTE — Patient Instructions (Addendum)
For allergy medication: I suggest taking something without the "D" (which is a decongestant and can raise your blood pressure) - plain/generic zyrtec, allegra, claritin are best.   Please check your blood pressures at home and write them down. Bring these recorded blood pressures as well as your home cuff to your cardiology visit on Friday.  [email or web address]">   You can avoid vitamin D a week prior to surgery and can avoid vitamin D for a week before surgery. You can keep taking everything else.    DASH Eating Plan DASH stands for Dietary Approaches to Stop Hypertension. The DASH eating plan is a healthy eating plan that has been shown to:  Reduce high blood pressure (hypertension).  Reduce your risk for type 2 diabetes, heart disease, and stroke.  Help with weight loss. What are tips for following this plan? Reading food labels  Check food labels for the amount of salt (sodium) per serving. Choose foods with less than 5 percent of the Daily Value of sodium. Generally, foods with less than 300 milligrams (mg) of sodium per serving fit into this eating plan.  To find whole grains, look for the word "whole" as the first word in the ingredient list. Shopping  Buy products labeled as "low-sodium" or "no salt added."  Buy fresh foods. Avoid canned foods and pre-made or frozen meals. Cooking  Avoid adding salt when cooking. Use salt-free seasonings or herbs instead of table salt or sea salt. Check with your health care provider or pharmacist before using salt substitutes.  Do not fry foods. Cook foods using healthy methods such as baking, boiling, grilling, roasting, and broiling instead.  Cook with heart-healthy oils, such as olive, canola, avocado, soybean, or sunflower oil. Meal planning  Eat a balanced diet that includes: ? 4 or more servings of fruits and 4 or more servings of vegetables each day. Try to fill one-half of your plate  with fruits and vegetables. ? 6-8 servings of whole grains each day. ? Less than 6 oz (170 g) of lean meat, poultry, or fish each day. A 3-oz (85-g) serving of meat is about the same size as a deck of cards. One egg equals 1 oz (28 g). ? 2-3 servings of low-fat dairy each day. One serving is 1 cup (237 mL). ? 1 serving of nuts, seeds, or beans 5 times each week. ? 2-3 servings of heart-healthy fats. Healthy fats called omega-3 fatty acids are found in foods such as walnuts, flaxseeds, fortified milks, and eggs. These fats are also found in cold-water fish, such as sardines, salmon, and mackerel.  Limit how much you eat of: ? Canned or prepackaged foods. ? Food that is high in trans fat, such as some fried foods. ? Food that is high in saturated fat, such as fatty meat. ? Desserts and other sweets, sugary drinks, and other foods with added sugar. ? Full-fat dairy products.  Do not salt foods before eating.  Do not eat more than 4 egg yolks a week.  Try to eat at least 2 vegetarian meals a week.  Eat more home-cooked food and less restaurant, buffet, and fast food.   Lifestyle  When eating at a restaurant, ask that your food be prepared with less salt or no salt, if possible.  If you drink alcohol: ? Limit how much you use to:  0-1 drink a day for women who are not pregnant.  0-2 drinks a day for men. ? Be aware of how much alcohol is in  your drink. In the [place] one drink equals one 12 oz bottle of beer (355 mL), one 5 oz glass of wine (148 mL), or one 1 oz glass of hard liquor (44 mL). General information  Avoid eating more than 2,300 mg of salt a day. If you have hypertension, you may need to reduce your sodium intake to 1,500 mg a day.  Work with your health care provider to maintain a healthy body weight or to lose weight. Ask what an ideal weight is for you.  Get at least 30 minutes of exercise that causes your heart to beat faster (aerobic exercise) most days of the week.  Activities may include walking, swimming, or biking.  Work with your health care provider or dietitian to adjust your eating plan to your individual calorie needs. What foods should I eat? Fruits All fresh, dried, or frozen fruit. Canned fruit in natural juice (without added sugar). Vegetables Fresh or frozen vegetables (raw, steamed, roasted, or grilled). Low-sodium or reduced-sodium tomato and vegetable juice. Low-sodium or reduced-sodium tomato sauce and tomato paste. Low-sodium or reduced-sodium canned vegetables. Grains Whole-grain or whole-wheat bread. Whole-grain or whole-wheat pasta. Brown rice. [place]ena Morrow. Bulgur. Whole-grain and low-sodium cereals. Pita bread. Low-fat, low-sodium crackers. Whole-wheat flour tortillas. Meats and other proteins Skinless chicken or [place]. Ground chicken or [place]. Pork with fat trimmed off. Fish and seafood. Egg whites. Dried beans, peas, or lentils. Unsalted nuts, nut butters, and seeds. Unsalted canned beans. Lean cuts of beef with fat trimmed off. Low-sodium, lean precooked or cured meat, such as sausages or meat loaves. Dairy Low-fat (1%) or fat-free (skim) milk. Reduced-fat, low-fat, or fat-free cheeses. Nonfat, low-sodium ricotta or cottage cheese. Low-fat or nonfat yogurt. Low-fat, low-sodium cheese. Fats and oils Soft margarine without trans fats. Vegetable oil. Reduced-fat, low-fat, or light mayonnaise and salad dressings (reduced-sodium). Canola, safflower, olive, avocado, soybean, and sunflower oils. Avocado. Seasonings and condiments Herbs. Spices. Seasoning mixes without salt. Other foods Unsalted popcorn and pretzels. Fat-free sweets. The items listed above may not be a complete list of foods and beverages you can eat. Contact a dietitian for more information. What foods should I avoid? Fruits Canned fruit in a light or heavy syrup. Fried fruit. Fruit in cream or butter sauce. Vegetables Creamed or fried vegetables. Vegetables in a  cheese sauce. Regular canned vegetables (not low-sodium or reduced-sodium). Regular canned tomato sauce and paste (not low-sodium or reduced-sodium). Regular tomato and vegetable juice (not low-sodium or reduced-sodium). Angie Fava. Olives. Grains Baked goods made with fat, such as croissants, muffins, or some breads. Dry pasta or rice meal packs. Meats and other proteins Fatty cuts of meat. Ribs. Fried meat. Berniece Salines. Bologna, salami, and other precooked or cured meats, such as sausages or meat loaves. Fat from the back of a pig (fatback). Bratwurst. Salted nuts and seeds. Canned beans with added salt. Canned or smoked fish. Whole eggs or egg yolks. Chicken or [place] with skin. Dairy Whole or 2% milk, cream, and half-and-half. Whole or full-fat cream cheese. Whole-fat or sweetened yogurt. Full-fat cheese. Nondairy creamers. Whipped toppings. Processed cheese and cheese spreads. Fats and oils Butter. Stick margarine. Lard. Shortening. Ghee. Bacon fat. Tropical oils, such as coconut, palm kernel, or palm oil. Seasonings and condiments Onion salt, garlic salt, seasoned salt, table salt, and sea salt. Worcestershire sauce. Tartar sauce. Barbecue sauce. Teriyaki sauce. Soy sauce, including reduced-sodium. Steak sauce. Canned and packaged gravies. Fish sauce. Oyster sauce. Cocktail sauce. Store-bought horseradish. Ketchup. Mustard. Meat flavorings and tenderizers. Bouillon cubes. Hot sauces.  Pre-made or packaged marinades. Pre-made or packaged taco seasonings. Relishes. Regular salad dressings. Other foods Salted popcorn and pretzels. The items listed above may not be a complete list of foods and beverages you should avoid. Contact a dietitian for more information. Where to find more information  National Heart, Lung, and Blood Institute: https://wilson-eaton.com/  American Heart Association: www.heart.org  Academy of Nutrition and Dietetics: www.eatright.Brenda:  www.kidney.org Summary  The DASH eating plan is a healthy eating plan that has been shown to reduce high blood pressure (hypertension). It may also reduce your risk for type 2 diabetes, heart disease, and stroke.  When on the DASH eating plan, aim to eat more fresh fruits and vegetables, whole grains, lean proteins, low-fat dairy, and heart-healthy fats.  With the DASH eating plan, you should limit salt (sodium) intake to 2,300 mg a day. If you have hypertension, you may need to reduce your sodium intake to 1,500 mg a day.  Work with your health care provider or dietitian to adjust your eating plan to your individual calorie needs. This information is not intended to replace advice given to you by your health care provider. Make sure you discuss any questions you have with your health care provider. Document Revised: 03/21/2019 Document Reviewed: 03/21/2019 Elsevier Patient Education  2021 [employer].

## 2020-07-08 ENCOUNTER — Other Ambulatory Visit: Payer: Self-pay

## 2020-07-08 ENCOUNTER — Encounter (HOSPITAL_COMMUNITY): Payer: Self-pay

## 2020-07-08 ENCOUNTER — Encounter (HOSPITAL_COMMUNITY)
Admission: RE | Admit: 2020-07-08 | Discharge: 2020-07-08 | Disposition: A | Discharge status: Home / Self Care | Payer: Medicare Other | Source: Ambulatory Visit | Attending: Orthopedic Surgery | Admitting: Orthopedic Surgery

## 2020-07-08 ENCOUNTER — Ambulatory Visit (HOSPITAL_COMMUNITY)
Admission: RE | Admit: 2020-07-08 | Discharge: 2020-07-08 | Disposition: A | Discharge status: Home / Self Care | Payer: Medicare Other | Source: Ambulatory Visit | Attending: Orthopedic Surgery | Admitting: Orthopedic Surgery

## 2020-07-08 DIAGNOSIS — Z01818 Encounter for other preprocedural examination: Secondary | ICD-10-CM | POA: Diagnosis not present

## 2020-07-08 DIAGNOSIS — Z01811 Encounter for preprocedural respiratory examination: Secondary | ICD-10-CM | POA: Insufficient documentation

## 2020-07-08 DIAGNOSIS — H9190 Unspecified hearing loss, unspecified ear: Secondary | ICD-10-CM | POA: Diagnosis not present

## 2020-07-08 DIAGNOSIS — Z79899 Other long term (current) drug therapy: Secondary | ICD-10-CM | POA: Diagnosis not present

## 2020-07-08 DIAGNOSIS — Z950 Presence of cardiac pacemaker: Secondary | ICD-10-CM | POA: Diagnosis not present

## 2020-07-08 DIAGNOSIS — M19011 Primary osteoarthritis, right shoulder: Secondary | ICD-10-CM | POA: Diagnosis not present

## 2020-07-08 DIAGNOSIS — G40909 Epilepsy, unspecified, not intractable, without status epilepticus: Secondary | ICD-10-CM | POA: Insufficient documentation

## 2020-07-08 LAB — URINALYSIS, ROUTINE W REFLEX MICROSCOPIC
Bilirubin Urine: NEGATIVE
Glucose, UA: NEGATIVE mg/dL
Hgb urine dipstick: NEGATIVE
Ketones, ur: 5 mg/dL — AB
Nitrite: NEGATIVE
Protein, ur: 30 mg/dL — AB
Specific Gravity, Urine: 1.017 (ref 1.005–1.030)
pH: 6 (ref 5.0–8.0)

## 2020-07-08 LAB — COMPREHENSIVE METABOLIC PANEL
ALT: 183 U/L — ABNORMAL HIGH (ref 0–44)
AST: 101 U/L — ABNORMAL HIGH (ref 15–41)
Albumin: 4.4 g/dL (ref 3.5–5.0)
Alkaline Phosphatase: 113 U/L (ref 38–126)
Anion gap: 10 (ref 5–15)
BUN: 9 mg/dL (ref 8–23)
CO2: 28 mmol/L (ref 22–32)
Calcium: 9.5 mg/dL (ref 8.9–10.3)
Chloride: 93 mmol/L — ABNORMAL LOW (ref 98–111)
Creatinine, Ser: 0.72 mg/dL (ref 0.44–1.00)
GFR, Estimated: >60 mL/min (ref >60)
Glucose, Bld: 90 mg/dL (ref 70–99)
Potassium: 4.2 mmol/L (ref 3.5–5.1)
Sodium: 131 mmol/L — ABNORMAL LOW (ref 135–145)
Total Bilirubin: 0.5 mg/dL (ref 0.3–1.2)
Total Protein: 7.7 g/dL (ref 6.5–8.1)

## 2020-07-08 LAB — CBC WITH DIFFERENTIAL/PLATELET
Abs Immature Granulocytes: 0.02 10*3/uL (ref 0.00–0.07)
Basophils Absolute: 0 10*3/uL (ref 0.0–0.1)
Basophils Relative: 1 %
Eosinophils Absolute: 0 10*3/uL (ref 0.0–0.5)
Eosinophils Relative: 1 %
HCT: 38.3 % (ref 36.0–46.0)
Hemoglobin: 13.1 g/dL (ref 12.0–15.0)
Immature Granulocytes: 0 %
Lymphocytes Relative: 28 %
Lymphs Abs: 1.8 10*3/uL (ref 0.7–4.0)
MCH: 31 pg (ref 26.0–34.0)
MCHC: 34.2 g/dL (ref 30.0–36.0)
MCV: 90.8 fL (ref 80.0–100.0)
Monocytes Absolute: 0.5 10*3/uL (ref 0.1–1.0)
Monocytes Relative: 8 %
Neutro Abs: 4 10*3/uL (ref 1.7–7.7)
Neutrophils Relative %: 62 %
Platelets: 273 10*3/uL (ref 150–400)
RBC: 4.22 MIL/uL (ref 3.87–5.11)
RDW: 14.4 % (ref 11.5–15.5)
WBC: 6.4 10*3/uL (ref 4.0–10.5)
nRBC: 0 % (ref 0.0–0.2)

## 2020-07-08 LAB — SURGICAL PCR SCREEN
MRSA, PCR: NEGATIVE
Staphylococcus aureus: NEGATIVE

## 2020-07-08 LAB — APTT: aPTT: 31 seconds (ref 24–36)

## 2020-07-08 NOTE — Progress Notes (Signed)
Cardiology Office Note Date:  07/08/2020  Patient ID:  [name] [name], [name] [name] March 22, 1956, MRN [id number] PCP:  [name], MD  Neurology: Dr. [name] Electrophysiologist: Dr. [name]   Chief Complaint: pre-op,  annual EP visit  History of Present Illness: [name] [name] is a 65 y.o. female with history of syncope/asystole s/p PPM, drop attacks, epilepsy, follows with neurology, deafness, HTN  She comes in today to be seen for Dr. [name].  I saw her 05/28/2017 She comes today accompanied by a sign language interpretor [name] w/[facility]).   The patient reports doing very well, no CP, palpitations or SOB, no dizzy spells, no near syncope or syncope.  She sees her PMD routinely and reports routine labs done there.  She saw Dr. [name] Feb 2020, was doing well, no changes were made.  She saw A. Lynnell Jude, NP March 2021, doing well, no changes were made.  TODAY today's visit is with the help of [name], sign language interpretor. She denies any CP, palpitations or SOB. No difficulties with her ADLs, Jan 31st she had a fainting spell that reuired laceration repair/head. In review of her ER record, felt to have been a seizure, no changes were made to her meds in d/w neurology at the time. None since No dizzy spells, no near syncope.   RCRI is zero, 0.4% DUKE 8.97METS  Device imformtion MDT dual chamber PPM, implanted 08/08/10  Past Medical History:  Diagnosis Date   Chicken pox    Conductive hearing loss, childhood onset    Deafness    from medication a a child for chicken pox   Epilepsy, followed by Dr. Tomma Rakers in neurology 08/26/2012   Hx of adenomatous colonic polyps 11/19/2016   Pacemaker    Pacemaker    2012   Seizure ([place])    Seizures ([place])    Petite Mal     Past Surgical History:  Procedure Laterality Date   HIP ARTHROPLASTY     PACEMAKER INSERTION  07/2010   TOTAL HIP ARTHROPLASTY  07/2011   right   TOTAL HIP ARTHROPLASTY  03/2012   left    Current  Outpatient Medications  Medication Sig Dispense Refill   acetaminophen (TYLENOL) 325 MG tablet Take 650 mg by mouth every 6 (six) hours as needed for headache or moderate pain.     amLODipine (NORVASC) 2.5 MG tablet Take 1 tablet (2.5 mg total) by mouth daily. 90 tablet 1   Ascorbic Acid (VITAMIN C PO) Take 1 tablet by mouth daily.      Cholecalciferol (VITAMIN D) 2000 UNITS CAPS Take 2,000 Units by mouth 2 (two) times daily.     folic acid (FOLVITE) 1 MG tablet Take 1 mg by mouth daily.     LamoTRIgine 300 MG TB24 24 hour tablet Take 1 tablet by mouth twice daily (Patient taking differently: Take 300 mg by mouth 2 (two) times daily.) 180 tablet 0   magnesium 30 MG tablet Take 30 mg by mouth daily.      Multiple Vitamin (MULTIVITAMIN WITH MINERALS) TABS tablet Take 2 tablets by mouth daily.     Oxcarbazepine (TRILEPTAL) 300 MG tablet TAKE 1 TABLET BY MOUTH IN THE MORNING 2 IN THE EVENING (Patient taking differently: Take 300-600 mg by mouth See admin instructions. Take 300 mg by mouth in the morning and 600 mg in the evening) 270 tablet 0   No current facility-administered medications for this visit.    Allergies:   Patient has no known allergies.   Social  History:  The patient  reports that she has never smoked. She has never used smokeless tobacco. She reports that she does not drink alcohol and does not use drugs.   Family History:  The patient's family history includes Colon cancer (age of onset: 67) in her father; Diabetes in her father and mother; Healthy in her brother and brother; Hypertension in her father and mother; Other in her mother.  ROS:  Please see the history of present illness.   All other systems are reviewed and otherwise negative.   PHYSICAL EXAM:  VS:  LMP 08/29/2005  BMI: There is no height or weight on file to calculate BMI. Well nourished, well developed, in no acute distress  HEENT: normocephalic, atraumatic  Neck: no JVD, carotid bruits or  masses Cardiac:   RRR; no significant murmurs, no rubs, or gallops Lungs:  CTA b/l, no wheezing, rhonchi or rales  Abd: soft, nontender MS: no deformity or atrophy Ext: no edema  Skin: warm and dry, no rash Neuro:  No gross deficits appreciated Psych: euthymic mood, full affect  PPM site is stable, no tethering or discomfort   EKG:  Done today and reviewed by myself  SR 63bpm, no changes  PPM interrogation done today and reviewed by myself:  Battery and lead measurements are good No arrhythmias AS/VS 99.9%  Recent Labs: 01/09/2020: TSH 1.30 07/08/2020: ALT 183; BUN 9; Creatinine, Ser 0.72; Hemoglobin 13.1; Platelets 273; Potassium 4.2; Sodium 131  01/09/2020: Cholesterol 212; HDL 75; LDL Cholesterol (Calc) 123; Total CHOL/HDL Ratio 2.8; Triglycerides 51   Estimated Creatinine Clearance: 70.4 mL/min (by C-G formula based on SCr of 0.72 mg/dL).   Wt Readings from Last 3 Encounters:  07/07/20 172 lb 11.2 oz (78.3 kg)  05/31/20 169 lb (76.7 kg)  02/24/20 169 lb (76.7 kg)     Other studies reviewed: Additional studies/records reviewed today include: summarized above  ASSESSMENT AND PLAN:  1. PPM     Intact function, no changes made   2. HTN     Looks good today     3. Pre-op shoulder surgery     Low riskj surgery, low cardiac risk     No need for pre-op cardiac testing     No cardiac contraindications for her surgery planned     Routine pacemaker management perioperatively (already sent via device clinic)   Disposition: remotes as usual and in clinic with EP in a year, sooner if needed.    Current medicines are reviewed at length with the patient today.  The patient did not have any concerns regarding medicines.  Venetia Night, PA-C 07/08/2020 7:45 PM     [address] [place] [address] [phone] (office)  [phone] (fax)

## 2020-07-08 NOTE — Progress Notes (Addendum)
Anesthesia Review:  PCP:  DR Ethlyn Gallery - LOV- 07/07/20  Cardiologist : DR Virl Axe  PT has appt on 07/09/20 at 245 pm for clearance in office visit note  clearance for surgery  Pacer check on 07/09/20 05/26/20 last device check prior to this  05/31/2020- Seizure- in ED  Neurology-  DR jaffe- 02/24/20  Chest x-ray :07/08/20 EKG :05/31/20  Echo : Stress test: Cardiac Cath :  Activity level: can do a flight of stairs without difficulty  Sleep Study/ CPAP : Fasting Blood Sugar :      / Checks Blood Sugar -- times a day:   Blood Thinner/ Instructions /Last Dose: ASA / Instructions/ Last Dose :  CMP done 07/08/20 routed to DR Tamera Punt.  U/A done 07/08/20 routed to DR chandler.  Spoke with Audria Nine in regards to CMP results of 07/08/2020 and is aware.

## 2020-07-09 ENCOUNTER — Encounter: Payer: Self-pay | Admitting: Internal Medicine

## 2020-07-09 ENCOUNTER — Ambulatory Visit (INDEPENDENT_AMBULATORY_CARE_PROVIDER_SITE_OTHER): Payer: Medicare Other | Admitting: Physician Assistant

## 2020-07-09 ENCOUNTER — Encounter: Payer: Self-pay | Admitting: Physician Assistant

## 2020-07-09 VITALS — BP 130/80 | HR 63 | Ht 62.0 in | Wt 169.8 lb

## 2020-07-09 DIAGNOSIS — Z01818 Encounter for other preprocedural examination: Secondary | ICD-10-CM | POA: Diagnosis not present

## 2020-07-09 DIAGNOSIS — Z950 Presence of cardiac pacemaker: Secondary | ICD-10-CM | POA: Diagnosis not present

## 2020-07-09 DIAGNOSIS — I10 Essential (primary) hypertension: Secondary | ICD-10-CM | POA: Diagnosis not present

## 2020-07-09 DIAGNOSIS — I495 Sick sinus syndrome: Secondary | ICD-10-CM | POA: Diagnosis not present

## 2020-07-09 LAB — CUP PACEART INCLINIC DEVICE CHECK
Battery Impedance: 991 Ohm
Battery Remaining Longevity: 74 mo
Battery Voltage: 2.77 V
Brady Statistic AP VP Percent: 0 %
Brady Statistic AP VS Percent: 0 %
Brady Statistic AS VP Percent: 0 %
Brady Statistic AS VS Percent: 100 %
Date Time Interrogation Session: 20220311154734
Implantable Lead Implant Date: 20120409
Implantable Lead Implant Date: 20120409
Implantable Lead Location: 753859
Implantable Lead Location: 753860
Implantable Lead Model: 4076
Implantable Lead Model: 4076
Implantable Pulse Generator Implant Date: 20120409
Lead Channel Impedance Value: 554 Ohm
Lead Channel Impedance Value: 646 Ohm
Lead Channel Pacing Threshold Amplitude: 0.5 V
Lead Channel Pacing Threshold Amplitude: 0.5 V
Lead Channel Pacing Threshold Amplitude: 0.625 V
Lead Channel Pacing Threshold Amplitude: 0.75 V
Lead Channel Pacing Threshold Amplitude: 1.25 V
Lead Channel Pacing Threshold Pulse Width: 0.12 ms
Lead Channel Pacing Threshold Pulse Width: 0.4 ms
Lead Channel Pacing Threshold Pulse Width: 0.4 ms
Lead Channel Pacing Threshold Pulse Width: 0.4 ms
Lead Channel Pacing Threshold Pulse Width: 0.4 ms
Lead Channel Sensing Intrinsic Amplitude: 22.4 mV
Lead Channel Sensing Intrinsic Amplitude: 4 mV
Lead Channel Setting Pacing Amplitude: 2 V
Lead Channel Setting Pacing Amplitude: 2.5 V
Lead Channel Setting Pacing Pulse Width: 0.4 ms
Lead Channel Setting Sensing Sensitivity: 5.6 mV

## 2020-07-09 NOTE — Progress Notes (Signed)
PERIOPERATIVE PRESCRIPTION FOR IMPLANTED CARDIAC DEVICE PROGRAMMING   Patient Information: Pt Name- [name] [name]  MRN-- [id number]  DOB- 07-May-1955  Right shoulder Arthroscopy DOS- 07/15/2020  Cautery will be used.   DR [facility]    Please send documentation back to:  [facility] (Fax # [phone])  [facility]  Athena Masse, RN  07/08/2020 3:47 PM      Device Information:   Clinic EP Physician:   Virl Axe, MD Device Type:  Pacemaker Manufacturer and Phone #:  Medtronic: [phone] Pacemaker Dependent?:  No Date of Last Device Check:  05/26/20        Normal Device Function?:  Yes     Electrophysiologist's Recommendations:    Have magnet available.  Provide continuous ECG monitoring when magnet is used or reprogramming is to be performed.   Procedure may interfere with device function.  Magnet should be placed over device during procedure.  Per Device Clinic Standing Orders, Drake Leach  07/09/2020 12:51 PM

## 2020-07-09 NOTE — Patient Instructions (Signed)
Medication Instructions:  Your physician recommends that you continue on your current medications as directed. Please refer to the Current Medication list given to you today.  *If you need a refill on your cardiac medications before your next appointment, please call your pharmacy*   Lab Work: NONE ORDERED  TODAY  If you have labs (blood work) drawn today and your tests are completely normal, you will receive your results only by: . MyChart Message (if you have MyChart) OR . A paper copy in the mail If you have any lab test that is abnormal or we need to change your treatment, we will call you to review the results.   Testing/Procedures: NONE ORDERED  TODAY   Follow-Up: At [facility], you and your health needs are our priority.  As part of our continuing mission to provide you with exceptional heart care, we have created designated Provider Care Teams.  These Care Teams include your primary Cardiologist (physician) and Advanced Practice Providers (APPs -  Physician Assistants and Nurse Practitioners) who all work together to provide you with the care you need, when you need it.  We recommend signing up for the patient portal called "MyChart".  Sign up information is provided on this After Visit Summary.  MyChart is used to connect with patients for Virtual Visits (Telemedicine).  Patients are able to view lab/test results, encounter notes, upcoming appointments, etc.  Non-urgent messages can be sent to your provider as well.   To learn more about what you can do with MyChart, go to [email or web address].    Your next appointment:   1 year(s)  The format for your next appointment:   In Person  Provider:   You may see Dr. Klein  or one of the following Advanced Practice Providers on your designated Care Team:    Amber Seiler, NP  Renee Ursuy, PA-C  Michael "Andy" Tillery, PA-C    Other Instructions   

## 2020-07-10 ENCOUNTER — Ambulatory Visit
Admission: RE | Admit: 2020-07-10 | Discharge: 2020-07-10 | Disposition: A | Discharge status: Home / Self Care | Payer: Medicare Other | Source: Ambulatory Visit | Attending: Orthopedic Surgery | Admitting: Orthopedic Surgery

## 2020-07-10 ENCOUNTER — Other Ambulatory Visit: Payer: Self-pay

## 2020-07-10 DIAGNOSIS — M19011 Primary osteoarthritis, right shoulder: Secondary | ICD-10-CM | POA: Diagnosis not present

## 2020-07-10 DIAGNOSIS — M7551 Bursitis of right shoulder: Secondary | ICD-10-CM | POA: Diagnosis not present

## 2020-07-10 DIAGNOSIS — Z01818 Encounter for other preprocedural examination: Secondary | ICD-10-CM | POA: Diagnosis not present

## 2020-07-12 ENCOUNTER — Ambulatory Visit: Payer: Medicare Other

## 2020-07-12 ENCOUNTER — Other Ambulatory Visit (HOSPITAL_COMMUNITY)
Admission: RE | Admit: 2020-07-12 | Discharge: 2020-07-12 | Disposition: A | Discharge status: Home / Self Care | Payer: Medicare Other | Source: Ambulatory Visit | Attending: Orthopedic Surgery | Admitting: Orthopedic Surgery

## 2020-07-12 DIAGNOSIS — Z01812 Encounter for preprocedural laboratory examination: Secondary | ICD-10-CM | POA: Diagnosis not present

## 2020-07-12 DIAGNOSIS — Z20822 Contact with and (suspected) exposure to covid-19: Secondary | ICD-10-CM | POA: Insufficient documentation

## 2020-07-13 LAB — SARS CORONAVIRUS 2 (TAT 6-24 HRS): SARS Coronavirus 2: NEGATIVE

## 2020-07-13 NOTE — Progress Notes (Addendum)
Anesthesia Chart Review   Case: [id number] Date/Time: 07/15/20 1151   Procedure: TOTAL SHOULDER ARTHROPLASTY (Right Shoulder) - NEED 120 MINUTES   Anesthesia type: Choice   Pre-op diagnosis: RIGHT SHOULDER ARTHRITIS   Location: [facility] ROOM 07 / [facility]   Surgeons: Tania Ade, MD      DISCUSSION:64 y.o. never smoker with h/o deafness, epilepsy followed by neurology, syncope/asystole s/p pacemaker (device orders in 07/09/2020 progress note, may interfere, magnet should be placed over device), right shoulder arthritis scheduled for above procedure 07/15/2020 with Dr. Tania Ade.   Pt last seen by cardiology 07/09/2020. Per last OV note,  "Low riskj surgery, low cardiac risk     No need for pre-op cardiac testing     No cardiac contraindications for her surgery planned     Routine pacemaker management perioperatively (already sent via device clinic)"  Elevated AST/ALT at PAT visit 07/08/2020.  PCP rechecking labs at 3:30pm 07/14/2020. Further recommendations based on these results. Surgeon's office is aware.   Addendum 07/14/20 5pm:  Pt did not go to PCP office for lab recheck, she was under the impression she could not go to medical visits while in quarantine after her COVID test.  Will recheck CMP DOS, anesthesia to evaluate. Surgeon's office is aware and wishes to proceed.   Addendum 3/17/202:  Per PCP, Dr. Ethlyn Gallery, "If any abdominal discomfort/nausea I would hold off on surgery, but is she is feeling fine and numbers have not worsened, we can follow up as outpatient." VS: BP (!) 184/87   Pulse 65   Temp 36.9 C (Oral)   Resp 16   LMP 08/29/2005   SpO2 100%   PROVIDERS: [name], MD is PCP   Virl Axe, MD is Cardiologist  LABS: forwarded to PCP  (all labs ordered are listed, but only abnormal results are displayed)  Labs Reviewed  COMPREHENSIVE METABOLIC PANEL - Abnormal; Notable for the following components:      Result Value   Sodium 131 (*)    Chloride 93  (*)    AST 101 (*)    ALT 183 (*)    All other components within normal limits  URINALYSIS, ROUTINE W REFLEX MICROSCOPIC - Abnormal; Notable for the following components:   Ketones, ur 5 (*)    Protein, ur 30 (*)    Leukocytes,Ua SMALL (*)    Bacteria, UA RARE (*)    All other components within normal limits  SURGICAL PCR SCREEN  CBC WITH DIFFERENTIAL/PLATELET  APTT  TYPE AND SCREEN     IMAGES:   EKG: 07/09/20 Rate 63 bpm  SR No changes  CV:  Past Medical History:  Diagnosis Date  . Chicken pox   . Conductive hearing loss, childhood onset   . 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 Epilepsy, followed by Dr. Tomma Rakers in neurology 08/26/2012  . Hx of adenomatous colonic polyps 11/19/2016  . Pacemaker   . Pacemaker    2012  . Seizure ([place])   . Seizures ([place])    Petite Mal     Past Surgical History:  Procedure Laterality Date  . HIP ARTHROPLASTY    . PACEMAKER INSERTION  07/2010  . TOTAL HIP ARTHROPLASTY  07/2011   right  . TOTAL HIP ARTHROPLASTY  03/2012   left    MEDICATIONS: . acetaminophen (TYLENOL) 325 MG tablet  . amLODipine (NORVASC) 2.5 MG tablet  . Ascorbic Acid (VITAMIN C PO)  . Cholecalciferol (VITAMIN D)  2000 UNITS CAPS  . folic acid (FOLVITE) 1 MG tablet  . magnesium 30 MG tablet  . Multiple Vitamin (MULTIVITAMIN WITH MINERALS) TABS tablet   No current facility-administered medications for this encounter.     [name] Felix, PA-C [facility] Pre-Surgical Testing [phone]

## 2020-07-14 ENCOUNTER — Telehealth: Payer: Self-pay | Admitting: Family Medicine

## 2020-07-14 ENCOUNTER — Ambulatory Visit: Payer: Medicare Other | Admitting: Physical Therapy

## 2020-07-14 ENCOUNTER — Other Ambulatory Visit: Payer: Medicare Other

## 2020-07-14 DIAGNOSIS — R748 Abnormal levels of other serum enzymes: Secondary | ICD-10-CM

## 2020-07-14 NOTE — Telephone Encounter (Signed)
Spoke with the pts mother and informed her of the message below.  Lab appt scheduled for today to arrive at 3:30pm.

## 2020-07-15 ENCOUNTER — Ambulatory Visit (HOSPITAL_COMMUNITY)
Admission: RE | Admit: 2020-07-15 | Discharge: 2020-07-15 | Disposition: A | Discharge status: Home / Self Care | Payer: Medicare Other | Attending: Orthopedic Surgery | Admitting: Orthopedic Surgery

## 2020-07-15 ENCOUNTER — Encounter (HOSPITAL_COMMUNITY)
Admission: RE | Disposition: A | Discharge status: Home / Self Care | Payer: Self-pay | Source: Home / Self Care | Attending: Orthopedic Surgery

## 2020-07-15 ENCOUNTER — Ambulatory Visit (HOSPITAL_COMMUNITY): Payer: Medicare Other

## 2020-07-15 ENCOUNTER — Encounter (HOSPITAL_COMMUNITY): Payer: Self-pay | Admitting: Orthopedic Surgery

## 2020-07-15 ENCOUNTER — Ambulatory Visit (HOSPITAL_COMMUNITY): Payer: Medicare Other | Admitting: Anesthesiology

## 2020-07-15 ENCOUNTER — Ambulatory Visit (HOSPITAL_COMMUNITY): Payer: Medicare Other | Admitting: Physician Assistant

## 2020-07-15 DIAGNOSIS — Z79899 Other long term (current) drug therapy: Secondary | ICD-10-CM | POA: Diagnosis not present

## 2020-07-15 DIAGNOSIS — R600 Localized edema: Secondary | ICD-10-CM | POA: Diagnosis not present

## 2020-07-15 DIAGNOSIS — Z96611 Presence of right artificial shoulder joint: Secondary | ICD-10-CM

## 2020-07-15 DIAGNOSIS — Z8249 Family history of ischemic heart disease and other diseases of the circulatory system: Secondary | ICD-10-CM | POA: Insufficient documentation

## 2020-07-15 DIAGNOSIS — M19011 Primary osteoarthritis, right shoulder: Secondary | ICD-10-CM | POA: Insufficient documentation

## 2020-07-15 DIAGNOSIS — C4492 Squamous cell carcinoma of skin, unspecified: Secondary | ICD-10-CM | POA: Diagnosis not present

## 2020-07-15 DIAGNOSIS — M25711 Osteophyte, right shoulder: Secondary | ICD-10-CM | POA: Diagnosis not present

## 2020-07-15 DIAGNOSIS — Z86010 Personal history of colonic polyps: Secondary | ICD-10-CM | POA: Diagnosis not present

## 2020-07-15 DIAGNOSIS — Z9889 Other specified postprocedural states: Secondary | ICD-10-CM | POA: Diagnosis not present

## 2020-07-15 DIAGNOSIS — Z471 Aftercare following joint replacement surgery: Secondary | ICD-10-CM | POA: Diagnosis not present

## 2020-07-15 DIAGNOSIS — Z950 Presence of cardiac pacemaker: Secondary | ICD-10-CM | POA: Diagnosis not present

## 2020-07-15 DIAGNOSIS — Z833 Family history of diabetes mellitus: Secondary | ICD-10-CM | POA: Diagnosis not present

## 2020-07-15 DIAGNOSIS — G8918 Other acute postprocedural pain: Secondary | ICD-10-CM | POA: Diagnosis not present

## 2020-07-15 DIAGNOSIS — M85811 Other specified disorders of bone density and structure, right shoulder: Secondary | ICD-10-CM | POA: Insufficient documentation

## 2020-07-15 DIAGNOSIS — Z800 Family history of malignant neoplasm of digestive organs: Secondary | ICD-10-CM | POA: Diagnosis not present

## 2020-07-15 DIAGNOSIS — G43909 Migraine, unspecified, not intractable, without status migrainosus: Secondary | ICD-10-CM | POA: Diagnosis not present

## 2020-07-15 DIAGNOSIS — Z01818 Encounter for other preprocedural examination: Secondary | ICD-10-CM | POA: Diagnosis not present

## 2020-07-15 HISTORY — PX: TOTAL SHOULDER ARTHROPLASTY: SHX126

## 2020-07-15 LAB — TYPE AND SCREEN
ABO/RH(D): O POS
Antibody Screen: NEGATIVE

## 2020-07-15 LAB — COMPREHENSIVE METABOLIC PANEL
ALT: 36 U/L (ref 0–44)
AST: 22 U/L (ref 15–41)
Albumin: 4.2 g/dL (ref 3.5–5.0)
Alkaline Phosphatase: 94 U/L (ref 38–126)
Anion gap: 8 (ref 5–15)
BUN: 16 mg/dL (ref 8–23)
CO2: 24 mmol/L (ref 22–32)
Calcium: 9.2 mg/dL (ref 8.9–10.3)
Chloride: 99 mmol/L (ref 98–111)
Creatinine, Ser: 0.72 mg/dL (ref 0.44–1.00)
GFR, Estimated: >60 mL/min (ref >60)
Glucose, Bld: 88 mg/dL (ref 70–99)
Potassium: 4.1 mmol/L (ref 3.5–5.1)
Sodium: 131 mmol/L — ABNORMAL LOW (ref 135–145)
Total Bilirubin: 0.6 mg/dL (ref 0.3–1.2)
Total Protein: 7.6 g/dL (ref 6.5–8.1)

## 2020-07-15 LAB — ABO/RH: ABO/RH(D): O POS

## 2020-07-15 SURGERY — ARTHROPLASTY, SHOULDER, TOTAL
Anesthesia: General | Site: Shoulder | Laterality: Right

## 2020-07-15 MED ORDER — DEXAMETHASONE SODIUM PHOSPHATE 10 MG/ML IJ SOLN
INTRAMUSCULAR | Status: AC
  Filled 2020-07-15: qty 1

## 2020-07-15 MED ORDER — PHENYLEPHRINE HCL-NACL 10-0.9 MG/250ML-% IV SOLN
INTRAVENOUS | Status: DC | PRN
  Administered 2020-07-15: 20 ug/min via INTRAVENOUS

## 2020-07-15 MED ORDER — TRANEXAMIC ACID-NACL 1000-0.7 MG/100ML-% IV SOLN
1000.0000 mg | INTRAVENOUS | Status: AC
  Administered 2020-07-15: 1000 mg via INTRAVENOUS
  Filled 2020-07-15: qty 100

## 2020-07-15 MED ORDER — OXYCODONE HCL 5 MG PO TABS: 5.0000 mg | ORAL_TABLET | Freq: Once | ORAL | Status: DC | PRN

## 2020-07-15 MED ORDER — SODIUM CHLORIDE 0.9 % IR SOLN
Status: DC | PRN
  Administered 2020-07-15: 1000 mL

## 2020-07-15 MED ORDER — FENTANYL CITRATE (PF) 100 MCG/2ML IJ SOLN
50.0000 ug | Freq: Once | INTRAMUSCULAR | Status: AC
  Administered 2020-07-15: 50 ug via INTRAVENOUS
  Filled 2020-07-15: qty 2

## 2020-07-15 MED ORDER — PROPOFOL 10 MG/ML IV BOLUS
INTRAVENOUS | Status: DC | PRN
  Administered 2020-07-15: 50 mg via INTRAVENOUS
  Administered 2020-07-15: 150 mg via INTRAVENOUS

## 2020-07-15 MED ORDER — ROCURONIUM BROMIDE 10 MG/ML (PF) SYRINGE
PREFILLED_SYRINGE | INTRAVENOUS | Status: AC
  Filled 2020-07-15: qty 10

## 2020-07-15 MED ORDER — BUPIVACAINE LIPOSOME 1.3 % IJ SUSP
INTRAMUSCULAR | Status: DC | PRN
  Administered 2020-07-15: 10 mL via PERINEURAL

## 2020-07-15 MED ORDER — MEPERIDINE HCL 50 MG/ML IJ SOLN: 6.2500 mg | INTRAMUSCULAR | Status: DC | PRN

## 2020-07-15 MED ORDER — MIDAZOLAM HCL 2 MG/2ML IJ SOLN
1.0000 mg | INTRAMUSCULAR | Status: AC
  Administered 2020-07-15: 1 mg via INTRAVENOUS
  Filled 2020-07-15: qty 2

## 2020-07-15 MED ORDER — ACETAMINOPHEN 325 MG PO TABS: 325.0000 mg | ORAL_TABLET | ORAL | Status: DC | PRN

## 2020-07-15 MED ORDER — CEFAZOLIN SODIUM-DEXTROSE 2-4 GM/100ML-% IV SOLN
2.0000 g | INTRAVENOUS | Status: AC
  Administered 2020-07-15: 2 g via INTRAVENOUS
  Filled 2020-07-15: qty 100

## 2020-07-15 MED ORDER — PHENYLEPHRINE HCL (PRESSORS) 10 MG/ML IV SOLN
INTRAVENOUS | Status: DC | PRN
  Administered 2020-07-15 (×2): 40 ug via INTRAVENOUS
  Administered 2020-07-15: 80 ug via INTRAVENOUS
  Administered 2020-07-15: 40 ug via INTRAVENOUS

## 2020-07-15 MED ORDER — LACTATED RINGERS IV SOLN: INTRAVENOUS | Status: DC

## 2020-07-15 MED ORDER — ONDANSETRON HCL 4 MG/2ML IJ SOLN
INTRAMUSCULAR | Status: DC | PRN
  Administered 2020-07-15: 4 mg via INTRAVENOUS

## 2020-07-15 MED ORDER — PROPOFOL 500 MG/50ML IV EMUL
INTRAVENOUS | Status: DC | PRN
  Administered 2020-07-15: 25 ug/kg/min via INTRAVENOUS

## 2020-07-15 MED ORDER — ONDANSETRON HCL 4 MG/2ML IJ SOLN
INTRAMUSCULAR | Status: AC
  Filled 2020-07-15: qty 2

## 2020-07-15 MED ORDER — OXYCODONE-ACETAMINOPHEN 5-325 MG PO TABS: ORAL_TABLET | ORAL | 0 refills | Status: DC

## 2020-07-15 MED ORDER — ONDANSETRON HCL 4 MG/2ML IJ SOLN: 4.0000 mg | Freq: Once | INTRAMUSCULAR | Status: DC | PRN

## 2020-07-15 MED ORDER — SUGAMMADEX SODIUM 200 MG/2ML IV SOLN
INTRAVENOUS | Status: DC | PRN
  Administered 2020-07-15: 100 mg via INTRAVENOUS

## 2020-07-15 MED ORDER — ACETAMINOPHEN 160 MG/5ML PO SOLN: 325.0000 mg | ORAL | Status: DC | PRN

## 2020-07-15 MED ORDER — ROCURONIUM BROMIDE 100 MG/10ML IV SOLN
INTRAVENOUS | Status: DC | PRN
  Administered 2020-07-15: 50 mg via INTRAVENOUS
  Administered 2020-07-15: 10 mg via INTRAVENOUS

## 2020-07-15 MED ORDER — TIZANIDINE HCL 4 MG PO TABS
4.0000 mg | ORAL_TABLET | Freq: Three times a day (TID) | ORAL | 1 refills | Status: DC | PRN
Start: 2020-07-15 — End: 2021-08-10

## 2020-07-15 MED ORDER — FENTANYL CITRATE (PF) 100 MCG/2ML IJ SOLN: 25.0000 ug | INTRAMUSCULAR | Status: DC | PRN

## 2020-07-15 MED ORDER — BUPIVACAINE-EPINEPHRINE (PF) 0.5% -1:200000 IJ SOLN
INTRAMUSCULAR | Status: DC | PRN
  Administered 2020-07-15: 15 mL via PERINEURAL

## 2020-07-15 MED ORDER — LIDOCAINE 2% (20 MG/ML) 5 ML SYRINGE
INTRAMUSCULAR | Status: AC
  Filled 2020-07-15: qty 5

## 2020-07-15 MED ORDER — PHENYLEPHRINE 40 MCG/ML (10ML) SYRINGE FOR IV PUSH (FOR BLOOD PRESSURE SUPPORT)
PREFILLED_SYRINGE | INTRAVENOUS | Status: AC
  Filled 2020-07-15: qty 10

## 2020-07-15 MED ORDER — DEXMEDETOMIDINE (PRECEDEX) IN NS 20 MCG/5ML (4 MCG/ML) IV SYRINGE
PREFILLED_SYRINGE | INTRAVENOUS | Status: DC | PRN
  Administered 2020-07-15 (×2): 4 ug via INTRAVENOUS

## 2020-07-15 MED ORDER — WATER FOR IRRIGATION, STERILE IR SOLN
Status: DC | PRN
  Administered 2020-07-15: 1000 mL

## 2020-07-15 MED ORDER — DEXAMETHASONE SODIUM PHOSPHATE 4 MG/ML IJ SOLN
INTRAMUSCULAR | Status: DC | PRN
  Administered 2020-07-15: 10 mg via INTRAVENOUS

## 2020-07-15 MED ORDER — EPHEDRINE 5 MG/ML INJ
INTRAVENOUS | Status: AC
  Filled 2020-07-15: qty 10

## 2020-07-15 MED ORDER — 0.9 % SODIUM CHLORIDE (POUR BTL) OPTIME
TOPICAL | Status: DC | PRN
  Administered 2020-07-15: 1000 mL

## 2020-07-15 MED ORDER — OXYCODONE HCL 5 MG/5ML PO SOLN: 5.0000 mg | Freq: Once | ORAL | Status: DC | PRN

## 2020-07-15 MED ORDER — PROPOFOL 10 MG/ML IV BOLUS
INTRAVENOUS | Status: AC
  Filled 2020-07-15: qty 20

## 2020-07-15 MED ORDER — GLYCOPYRROLATE PF 0.2 MG/ML IJ SOSY
PREFILLED_SYRINGE | INTRAMUSCULAR | Status: AC
  Filled 2020-07-15: qty 1

## 2020-07-15 SURGICAL SUPPLY — 67 items
AID PSTN UNV HD RSTRNT DISP (MISCELLANEOUS) ×1
BAG SPEC THK2 15X12 ZIP CLS (MISCELLANEOUS) ×1
BAG ZIPLOCK 12X15 (MISCELLANEOUS) ×2 IMPLANT
BIT DRILL 1.6MX128 (BIT) ×2 IMPLANT
BLADE SAW SAG 73X25 THK (BLADE) ×1
BLADE SAW SGTL 73X25 THK (BLADE) ×1 IMPLANT
CEMENT BONE DEPUY (Cement) ×2 IMPLANT
COMP CORT GLENOID AUG MED RT (Miscellaneous) ×2 IMPLANT
COMPONENT CORT GLND AUG MD RT (Miscellaneous) IMPLANT
COOLER ICEMAN CLASSIC (MISCELLANEOUS) ×1 IMPLANT
COVER BACK TABLE 60X90IN (DRAPES) ×2 IMPLANT
COVER SURGICAL LIGHT HANDLE (MISCELLANEOUS) ×2 IMPLANT
COVER WAND RF STERILE (DRAPES) IMPLANT
DRAPE INCISE IOBAN 66X45 STRL (DRAPES) ×2 IMPLANT
DRAPE ORTHO SPLIT 77X108 STRL (DRAPES) ×4
DRAPE POUCH INSTRU U-SHP 10X18 (DRAPES) ×2 IMPLANT
DRAPE SURG 17X11 SM STRL (DRAPES) ×2 IMPLANT
DRAPE SURG ORHT 6 SPLT 77X108 (DRAPES) ×2 IMPLANT
DRAPE TOP 10253 STERILE (DRAPES) ×2 IMPLANT
DRAPE U-SHAPE 47X51 STRL (DRAPES) ×2 IMPLANT
DRSG AQUACEL AG ADV 3.5X 6 (GAUZE/BANDAGES/DRESSINGS) ×2 IMPLANT
DURAPREP 26ML APPLICATOR (WOUND CARE) ×4 IMPLANT
ELECT BLADE TIP CTD 4 INCH (ELECTRODE) ×2 IMPLANT
ELECT REM PT RETURN 15FT ADLT (MISCELLANEOUS) ×2 IMPLANT
GLOVE SRG 8 PF TXTR STRL LF DI (GLOVE) ×1 IMPLANT
GLOVE SURG ENC MOIS LTX SZ7 (GLOVE) ×2 IMPLANT
GLOVE SURG ENC MOIS LTX SZ7.5 (GLOVE) ×2 IMPLANT
GLOVE SURG UNDER POLY LF SZ7 (GLOVE) ×2 IMPLANT
GLOVE SURG UNDER POLY LF SZ8 (GLOVE) ×2
GOWN STRL REUS W/TWL LRG LVL3 (GOWN DISPOSABLE) ×2 IMPLANT
GOWN STRL REUS W/TWL XL LVL3 (GOWN DISPOSABLE) ×2 IMPLANT
GUIDEWIRE GLENOID 2.5X220 (WIRE) ×1 IMPLANT
HANDPIECE INTERPULSE COAX TIP (DISPOSABLE) ×2
HEAD HUMERAL AEQUALIS 46X17 (Head) ×2 IMPLANT
HEAD HUMERAL LOW OS 46X17 (Head) IMPLANT
HEMOSTAT SURGICEL 2X14 (HEMOSTASIS) ×2 IMPLANT
HOOD PEEL AWAY FLYTE STAYCOOL (MISCELLANEOUS) ×6 IMPLANT
KIT BASIN OR (CUSTOM PROCEDURE TRAY) ×2 IMPLANT
KIT TURNOVER KIT A (KITS) ×2 IMPLANT
MANIFOLD NEPTUNE II (INSTRUMENTS) ×2 IMPLANT
NDL TROCAR POINT SZ 2 1/2 (NEEDLE) ×1 IMPLANT
NEEDLE TROCAR POINT SZ 2 1/2 (NEEDLE) ×2 IMPLANT
NS IRRIG 1000ML POUR BTL (IV SOLUTION) ×2 IMPLANT
PACK SHOULDER (CUSTOM PROCEDURE TRAY) ×2 IMPLANT
PAD COLD SHLDR WRAP-ON (PAD) IMPLANT
PROTECTOR NERVE ULNAR (MISCELLANEOUS) IMPLANT
RESTRAINT HEAD UNIVERSAL NS (MISCELLANEOUS) ×2 IMPLANT
RETRIEVER SUT HEWSON (MISCELLANEOUS) ×2 IMPLANT
SET HNDPC FAN SPRY TIP SCT (DISPOSABLE) ×1 IMPLANT
SLING ARM FOAM STRAP LRG (SOFTGOODS) ×1 IMPLANT
SLING ARM IMMOBILIZER LRG (SOFTGOODS) IMPLANT
SMARTMIX MINI TOWER (MISCELLANEOUS) ×2
SPONGE LAP 18X18 RF (DISPOSABLE) ×2 IMPLANT
STEM HUMERAL PTC SZ1C 66 1275D (Stem) ×1 IMPLANT
STRIP CLOSURE SKIN 1/2X4 (GAUZE/BANDAGES/DRESSINGS) ×2 IMPLANT
SUCTION FRAZIER HANDLE 12FR (TUBING) ×2
SUCTION TUBE FRAZIER 12FR DISP (TUBING) ×1 IMPLANT
SUPPORT WRAP ARM LG (MISCELLANEOUS) ×2 IMPLANT
SUT ETHIBOND 2 V 37 (SUTURE) ×2 IMPLANT
SUT MNCRL AB 4-0 PS2 18 (SUTURE) ×2 IMPLANT
SUT VIC AB 2-0 CT1 27 (SUTURE) ×2
SUT VIC AB 2-0 CT1 TAPERPNT 27 (SUTURE) ×1 IMPLANT
TAPE LABRALWHITE 1.5X36 (TAPE) ×2 IMPLANT
TAPE SUT LABRALTAP WHT/BLK (SUTURE) ×2 IMPLANT
TOWEL OR 17X26 10 PK STRL BLUE (TOWEL DISPOSABLE) ×2 IMPLANT
TOWER SMARTMIX MINI (MISCELLANEOUS) ×1 IMPLANT
WATER STERILE IRR 1000ML POUR (IV SOLUTION) ×2 IMPLANT

## 2020-07-15 NOTE — Anesthesia Procedure Notes (Signed)
Procedure Name: Intubation Date/Time: 07/15/2020 1:07 PM Performed by: Adalberto Ill, CRNA Pre-anesthesia Checklist: Patient identified, Emergency Drugs available, Suction available, Patient being monitored and Timeout performed Patient Re-evaluated:Patient Re-evaluated prior to induction Oxygen Delivery Method: Circle system utilized Preoxygenation: Pre-oxygenation with 100% oxygen Induction Type: IV induction Ventilation: Mask ventilation with difficulty Laryngoscope Size: Miller and 2 Grade View: Grade I Tube type: Oral Tube size: 7.0 mm Number of attempts: 1 Airway Equipment and Method: Stylet Placement Confirmation: ETT inserted through vocal cords under direct vision,  positive ETCO2 and breath sounds checked- equal and bilateral Secured at: 21 cm Tube secured with: Tape Dental Injury: Teeth and Oropharynx as per pre-operative assessment

## 2020-07-15 NOTE — Anesthesia Procedure Notes (Signed)
Anesthesia Regional Block: Interscalene brachial plexus block   Pre-Anesthetic Checklist: ,, timeout performed, Correct Patient, Correct Site, Correct Laterality, Correct Procedure, Correct Position, site marked, Risks and benefits discussed,  Surgical consent,  Pre-op evaluation,  At surgeon's request and post-op pain management  Laterality: Right  Prep: chloraprep       Needles:  Injection technique: Single-shot  Needle Type: Echogenic Stimulator Needle     Needle Length: 5cm  Needle Gauge: 22     Additional Needles:   Procedures:, nerve stimulator,,, ultrasound used (permanent image in chart),,,,   Nerve Stimulator or Paresthesia:  Response: hand, 0.45 mA,   Additional Responses:   Narrative:  Start time: 07/15/2020 11:50 AM End time: 07/15/2020 11:55 AM Injection made incrementally with aspirations every 5 mL.  Performed by: Personally  Anesthesiologist: Janeece Riggers, MD  Additional Notes: Functioning IV was confirmed and monitors were applied.  A 17mm 22ga Arrow echogenic stimulator needle was used. Sterile prep and drape,hand hygiene and sterile gloves were used. Ultrasound guidance: relevant anatomy identified, needle position confirmed, local anesthetic spread visualized around nerve(s)., vascular puncture avoided.  Image printed for medical record. Negative aspiration and negative test dose prior to incremental administration of local anesthetic. The patient tolerated the procedure well.

## 2020-07-15 NOTE — Evaluation (Signed)
Occupational Therapy Evaluation Patient Details Name: [name] [name] MRN: [id number] DOB: Dec 13, 1955 Today's Date: 07/15/2020    History of Present Illness R total shoulder arthroplasty   Clinical Impression   [name] [name] is a 65 year old woman s/p shoulder replacement without functional use of right dominant upper extremity secondary to effects of surgery and interscalene block and shoulder precautions. Therapist provided education and instruction to patient and daughter in regards to exercises, precautions, positioning, donning upper extremity clothing and bathing while maintaining shoulder precautions, ice and edema management with cuff and cooler and donning/doffing sling. Patient and spouse verbalized understanding and demonstrated as needed. Patient needed assistance to donn shirt, underwear, pants, and shoes and provided with instruction on compensatory strategies to perform ADLs. Patient to follow up with MD for further therapy needs.      Follow Up Recommendations  No OT follow up;Follow surgeon's recommendation for DC plan and follow-up therapies    Equipment Recommendations  None recommended by OT    Recommendations for Other Services       Precautions / Restrictions Precautions Precautions: Shoulder Type of Shoulder Precautions: s/p total shoulder, sling edu, hand wrist elbow rom, PROM limited to 90FF and 0ER Shoulder Interventions: Shoulder sling/immobilizer;At all times;Off for dressing/bathing/exercises Precaution Booklet Issued:  (handouts provided) Restrictions Weight Bearing Restrictions: Yes RUE Weight Bearing: Non weight bearing      Mobility Bed Mobility                    Transfers Overall transfer level: Needs assistance               General transfer comment: min guar/min assist    Balance Overall balance assessment: Mild deficits observed, not formally tested                                         ADL either  performed or assessed with clinical judgement   ADL Overall ADL's : Needs assistance/impaired Eating/Feeding: Set up   Grooming: Set up   Upper Body Bathing: Minimal assistance;Set up   Lower Body Bathing: Minimal assistance;Set up;Sitting/lateral leans   Upper Body Dressing : Maximal assistance;Sitting;Adhering to UE precautions   Lower Body Dressing: Moderate assistance;Sit to/from stand   Toilet Transfer: Min guard   Toileting- Water quality scientist and Hygiene: Minimal assistance;Sit to/from stand               Vision Patient Visual Report: No change from baseline       Perception     Praxis      Pertinent Vitals/Pain Pain Assessment: No/denies pain     Hand Dominance     Extremity/Trunk Assessment Upper Extremity Assessment Upper Extremity Assessment: RUE deficits/detail RUE Deficits / Details: limited active movement secondary to effects of block   Lower Extremity Assessment Lower Extremity Assessment: Overall WFL for tasks assessed       Communication     Cognition Arousal/Alertness: Awake/alert Behavior During Therapy: WFL for tasks assessed/performed Overall Cognitive Status: Within Functional Limits for tasks assessed                                     General Comments       Exercises     Shoulder Instructions Shoulder Instructions Donning/doffing shirt without moving shoulder: Maximal assistance Donning/doffing sling/immobilizer: Maximal  assistance Correct positioning of sling/immobilizer: Independent Pendulum exercises (written home exercise program): Independent ROM for elbow, wrist and digits of operated UE: Independent Sling wearing schedule (on at all times/off for ADL's): Independent Proper positioning of operated UE when showering: Independent Dressing change: Independent Positioning of UE while sleeping: [name] [name] expects to be discharged to:: Private residence Living  Arrangements: Children;Other relatives Available Help at Discharge: Family;Available 24 hours/day Type of Home: House                                  Prior Functioning/Environment                   OT Problem List: Decreased strength;Decreased range of motion;Impaired UE functional use;Increased edema      OT Treatment/Interventions:      OT Goals(Current goals can be found in the care plan section) Acute Rehab OT Goals OT Goal Formulation: All assessment and education complete, DC therapy  OT Frequency:     Barriers to D/C:            Co-evaluation              AM-PAC OT "6 Clicks" Daily Activity     Outcome Measure Help from another person eating meals?: A Little Help from another person taking care of personal grooming?: A Little Help from another person toileting, which includes using toliet, bedpan, or urinal?: A Little Help from another person bathing (including washing, rinsing, drying)?: A Little Help from another person to put on and taking off regular upper body clothing?: A Lot Help from another person to put on and taking off regular lower body clothing?: A Lot 6 Click Score: 16   End of Session Nurse Communication:  (Ot education complete)  Activity Tolerance: Patient tolerated treatment well Patient left: in chair;with call bell/phone within reach  OT Visit Diagnosis: Muscle weakness (generalized) (M62.81);Pain Pain - Right/Left: Right Pain - part of body: Shoulder                Time: 3094-0768 OT Time Calculation (min): 41 min Charges:  OT General Charges $OT Visit: 1 Visit OT Evaluation $OT Eval Low Complexity: 1 Low OT Treatments $Self Care/Home Management : 23-37 mins  Missy Baksh, OTR/L [place] [phone] Pager: [place] 07/15/2020, 5:49 PM

## 2020-07-15 NOTE — Discharge Instructions (Signed)
Discharge Instructions after Total Shoulder Arthroplasty   . A sling has been provided for you. Remove the sling 5 times each day to perform motion exercises. . Use ice on the shoulder intermittently over the first 48 hours after surgery.  . Pain medication has been prescribed for you.  . Use your medication liberally over the first 48 hours, and then begin to taper your use. You may take Extra Strength Tylenol or Tylenol only in place of the pain pills. DO NOT take ANY nonsteroidal anti-inflammatory pain medications: Advil, Motrin, Ibuprofen, Aleve, Naproxen, or Naprosyn. . Take one aspirin a day for 2 weeks after surgery, unless you have an aspirin sensitivity/allergy or asthma. . Leave your dressing on until your first follow up visit.  You may shower with the dressing.  Hold your arm as if you still have your sling on while you shower. . Active reaching and lifting are not permitted. You may use the operative arm for activities of daily living that do not require the operative arm to leave the side of the body, such as eating, drinking, bathing, etc.  . Three to 5 times each day you should perform assisted overhead reaching and external rotation (outward turning) exercises with the operative arm. You were taught these exercises prior to discharge. Both exercises should be done with the non-operative arm used as the "therapist arm" while the operative arm remains relaxed. Ten of each exercise should be done three to five times each day.   Overhead reach is helping to lift your stiff arm up as high as it will go. To stretch your overhead reach, lie flat on your back, relax, and grasp the wrist of the tight shoulder with your opposite hand. Using the power in your opposite arm, bring the stiff arm up as far as it is comfortable. Start holding it for ten seconds and then work up to where you can hold it for a count of 30. Breathe slowly and deeply while the arm is moved. Repeat this stretch ten times,  trying to help the ar up a little higher each time.     External rotation is turning the arm out to the side while your elbow stays close to your body. External rotation is best stretched while you are lying on your back. Hold a cane, yardstick, broom handle, or dowel in both hands. Bend both elbows to a right angle. Use steady, gentle force from your normal arm to rotate the hand of the stiff shoulder out away from your body. Continue the rotation until it is straight in front of you holding it there for a count of 10. Do not go beyond this level of rotation until seen back by Dr. Chandler. Repeat this exercise ten times slowly.      Please call [phone] during normal business hours or [phone] after hours for any problems. Including the following:  - excessive redness of the incisions - drainage for more than 4 days - fever of more than 101.5 F  *Please note that pain medications will not be refilled after hours or on weekends.  Dental Antibiotics:  In most cases prophylactic antibiotics for Dental procdeures after total joint surgery are not necessary.  Exceptions are as follows:  1. History of prior total joint infection  2. Severely immunocompromised (Organ Transplant, cancer chemotherapy, Rheumatoid biologic meds such as Humera)  3. Poorly controlled diabetes (A1C &gt; 8.0, blood glucose over 200)  If you have one of these conditions, contact your surgeon for   an antibiotic prescription, prior to your dental procedure.    

## 2020-07-15 NOTE — Anesthesia Postprocedure Evaluation (Signed)
Anesthesia Post Note  Patient: [name] [name]  Procedure(s) Performed: TOTAL SHOULDER ARTHROPLASTY (Right Shoulder)     Patient location during evaluation: PACU Anesthesia Type: General Level of consciousness: awake and alert Pain management: pain level controlled Vital Signs Assessment: post-procedure vital signs reviewed and stable Respiratory status: spontaneous breathing, nonlabored ventilation, respiratory function stable and patient connected to nasal cannula oxygen Cardiovascular status: stable and blood pressure returned to baseline Postop Assessment: no apparent nausea or vomiting Anesthetic complications: no   No complications documented.  Last Vitals:  Vitals:   07/15/20 1530 07/15/20 1550  BP: (!) 148/83 (!) 160/79  Pulse: 67 62  Resp: 13 16  Temp:    SpO2: 99% 98%    Last Pain:  Vitals:   07/15/20 1550  TempSrc:   PainSc: 2                  Alynah Schone

## 2020-07-15 NOTE — Transfer of Care (Signed)
Immediate Anesthesia Transfer of Care Note  Patient: [name] [name]  Procedure(s) Performed: TOTAL SHOULDER ARTHROPLASTY (Right Shoulder)  Patient Location: PACU  Anesthesia Type:GA combined with regional for post-op pain  Level of Consciousness: awake, alert , oriented and patient cooperative  Airway & Oxygen Therapy: Patient Spontanous Breathing  Post-op Assessment: Report given to RN and Post -op Vital signs reviewed and stable  Post vital signs: Reviewed and stable  Last Vitals:  Vitals Value Taken Time  BP 135/70 07/15/20 1504  Temp    Pulse 74 07/15/20 1507  Resp    SpO2 96 % 07/15/20 1507  Vitals shown include unvalidated device data.  Last Pain:  Vitals:   07/15/20 1100  TempSrc:   PainSc: 0-No pain      Patients Stated Pain Goal: 3 (60/47/99 8721)  Complications: No complications documented.

## 2020-07-15 NOTE — Op Note (Signed)
Procedure(s): TOTAL SHOULDER ARTHROPLASTY Procedure Note  [name] [name] female 65 y.o. 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 female  With endstage right shoulder arthritis. Pain and dysfunction interfered with quality of life and nonoperative treatment with activity modification, NSAIDS and injections failed.     Surgeon: Isabella Stalling   Assistants: Jeanmarie Hubert PA-C [facility] was present and scrubbed throughout the procedure and was essential in positioning, retraction, exposure, and closure)  Anesthesia: General endotracheal anesthesia with preoperative interscalene block given by the attending anesthesiologist      Procedure Detail  TOTAL SHOULDER ARTHROPLASTY  Findings: Tornier flex anatomic press-fit size 1 stem with a 46 head, cemented size medium 15 augmented performed plus Cortiloc glenoid.   A lesser tuberosity osteotomy was perform and repaired at the conclusion of the procedure.  Estimated Blood Loss:  200 mL         Drains: None   Blood Given: none          Specimens: none        Complications:  * No complications entered in OR log *         Disposition: PACU - hemodynamically stable.         Condition: stable    Procedure:   The patient was identified in the preoperative holding area where I personally marked the operative extremity after verifying with the patient and consent. She  was taken to the operating room where She was transferred to the   operative table.  The patient received an interscalene block in   the holding area by the attending anesthesiologist.  General anesthesia was induced   in the operating room without complication.  The patient did receive  IV  Ancef prior to the commencement of the procedure.  The patient was   placed in the beach-chair position with the back raised about 30   degrees.  The nonoperative extremity and head and neck were carefully   positioned and padded protecting against neurovascular compromise.  The   left upper extremity was then prepped and draped in the standard sterile   fashion.    The appropriate operative time-out was performed with   Anesthesia, the perioperative staff, as well as myself and we all agreed   that the right side was the correct operative site.  An approximately   10 cm incision was made from the tip of the coracoid to the center point of the   humerus at the level of the axilla.  Dissection was carried down sharply   through subcutaneous tissues and cephalic vein was identified and taken   laterally with the deltoid.  The pectoralis major was taken medially.  The   upper 1 cm of the pectoralis major was released from its attachment on   the humerus.  The clavipectoral fascia was incised just lateral to the   conjoined tendon.  This incision was carried up to but not into the   coracoacromial ligament.  Digital palpation was used to prove   integrity of the axillary nerve which was protected throughout the   procedure.  Musculocutaneous nerve was not palpated in the operative   field.  Conjoined tendon was then retracted gently medially and the   deltoid laterally.  Anterior circumflex humeral vessels were clamped and   coagulated.  The soft tissues overlying the biceps was incised and this   incision was carried across the transverse humeral ligament to the base   of the coracoid.  The biceps was noted to be severely degenerated. It was released from the superior labrum. The biceps was then tenodesed to the soft tissue just above   pectoralis major and the remaining portion of the biceps superiorly was   excised.  An osteotomy was performed at the lesser tuberosity.  The capsule was  then   released all the way down to the 6 o'clock position of the humeral head.   The humeral head was then delivered with simultaneous adduction,   extension and external rotation.  All humeral osteophytes were removed   and the anatomic neck of the humerus was marked and cut free hand at   approximately 25 degrees retroversion within about 3 mm of the cuff   reflection posteriorly.  The head size was estimated to be a 46 medium   offset.  At that point, the humeral head was retracted posteriorly with   a Fukuda retractor.   Remaining portion of the capsule was released at the base of the   coracoid.  The remaining biceps anchor and the entire anterior-inferior   labrum was excised.  The posterior labrum was also excised but the   posterior capsule was not released.  The glenoid was noted to be B2 morphology.  the guidepin was placed bicortically with perform plus anterior glenoid referencing guide.  The reamer was used to ream to the halfway point anteriorly.  The anterior reference hole was then drilled.  The posterior angled reamer was then used at 15 degrees until the posterior half of the glenoid was appropriately reamed.  The checker was used to ensure appropriate concentric fit.  The peripheral holes were then drilled followed by the the center hole and none of the holes   exited the glenoid wall.  The trial was placed and felt to be an appropriate fit.  I then pulse irrigated these holes and dried   them with Surgicel.  The three peripheral holes were then   pressurized cemented and the anchor peg glenoid was placed and impacted   with an excellent fit.  The glenoid was a medium 15 degree perform plus component.  The proximal humerus was then again exposed taking care not to displace the glenoid.    The entry awl was used followed by sounding reamers and then sequentially broached for size 1 . This was then left in place and the calcar planer was used. Trial head was placed with a 46.  With  the trial implantation of the component,  there was approximately 50% posterior translation with immediate snap back to the   anatomic position.  With forward elevation, there was no tendency   towards posterior subluxation.   The trial was removed and the final implant was prepared on a back table.  The trial was removed and the final implant was prepared on a back table.   3 small holes were drilled on the medial side of the lesser tuberosity osteotomy, through which 2 labral tapes were passed. The implant was then placed through the loop of the 2 labral tapes and impacted with an excellent press-fit. This achieved excellent anatomic reconstruction of the proximal humerus.  The joint was then copiously irrigated with pulse lavage.  The subscapularis and   lesser tuberosity osteotomy  were then repaired using the 2 labral tapes previously passed in a double row fashion with horizontal mattress sutures medially brought over through bone tunnels tied over a bone bridge laterally.   One #1 Ethibond was placed at the rotator interval just above   the lesser tuberosity. Copious irrigation was used. Skin was closed with 2-0 Vicryl sutures in the deep dermal layer and 4-0 Monocryl in a subcuticular  running fashion.  Sterile dressings were then applied including Aquacel.  The patient was placed in a sling and allowed to awaken from general anesthesia and taken to the recovery room in stable condition.      POSTOPERATIVE PLAN:  Early passive range of motion will be allowed with the goal of 0 degrees external rotation and 90 degrees forward elevation.  No internal rotation at this time.  No active motion of the arm until the lesser tuberosity heals.  The patient will be observed in the recovery room and if her pain is well controlled and she is hemodynamically stable she could likely be discharged home today with family.

## 2020-07-15 NOTE — Progress Notes (Signed)
AssistedDr. Ambrose Pancoast with right, ultrasound guided, interscalene  block. Side rails up, monitors on throughout procedure. See vital signs in flow sheet. Tolerated Procedure well.

## 2020-07-15 NOTE — Anesthesia Preprocedure Evaluation (Signed)
Anesthesia Evaluation  Patient identified by MRN, date of birth, ID band Patient awake    Reviewed: Allergy & Precautions, H&P , NPO status , Patient's Chart, lab work & pertinent test results, reviewed documented beta blocker date and time   Airway Mallampati: II  TM Distance: >3 FB Neck ROM: full    Dental no notable dental hx.    Pulmonary neg pulmonary ROS,    Pulmonary exam normal breath sounds clear to auscultation       Cardiovascular Exercise Tolerance: Good + pacemaker  Rhythm:regular Rate:Normal     Neuro/Psych  Headaches, Seizures -, Well Controlled,  negative psych ROS   GI/Hepatic negative GI ROS, Neg liver ROS,   Endo/Other  negative endocrine ROS  Renal/GU negative Renal ROS  negative genitourinary   Musculoskeletal   Abdominal   Peds  Hematology negative hematology ROS (+)   Anesthesia Other Findings   Reproductive/Obstetrics negative OB ROS                             Anesthesia Physical Anesthesia Plan  ASA: III  Anesthesia Plan: General   Post-op Pain Management: GA combined w/ Regional for post-op pain   Induction:   PONV Risk Score and Plan: 3 and Ondansetron, Dexamethasone and Midazolam  Airway Management Planned: Oral ETT  Additional Equipment:   Intra-op Plan:   Post-operative Plan: Extubation in OR  Informed Consent: I have reviewed the patients History and Physical, chart, labs and discussed the procedure including the risks, benefits and alternatives for the proposed anesthesia with the patient or authorized representative who has indicated his/her understanding and acceptance.     Dental Advisory Given  Plan Discussed with: CRNA and Anesthesiologist  Anesthesia Plan Comments: (Discussed both nerve block for pain relief post-op and GA; including NV, sore throat, dental injury, and pulmonary complications  [id number]:65 y.o. never smoker with  h/o deafness, epilepsy followed by neurology, syncope/asystole s/p pacemaker (device orders in 07/09/2020 progress note, may interfere, magnet should be placed over device), right shoulder arthritis scheduled for above procedure 07/15/2020 with Dr. Tania Ade.   Pt last seen by cardiology 07/09/2020. Per last OV note,  "Low riskj surgery, low cardiac risk No need for pre-op cardiac testing No cardiac contraindications for her surgery planned Routine pacemaker management perioperatively (already sent via device clinic)"       )        Anesthesia Quick Evaluation

## 2020-07-15 NOTE — H&P (Signed)
[name] [name] is an 65 y.o. female.   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  Failed conservative management.  Indicated for surgical treatment to decrease pain and restore function.  Past Medical History:  Diagnosis Date  . Chicken pox   . Conductive hearing loss, childhood onset   . 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 Epilepsy, followed by Dr. Tomma Rakers in neurology 08/26/2012  . Hx of adenomatous colonic polyps 11/19/2016  . Pacemaker   . Pacemaker    2012  . Seizure ([place])   . Seizures ([place])    Petite Mal     Past Surgical History:  Procedure Laterality Date  . HIP ARTHROPLASTY    . PACEMAKER INSERTION  07/2010  . TOTAL HIP ARTHROPLASTY  07/2011   right  . TOTAL HIP ARTHROPLASTY  03/2012   left    Family History  Problem Relation Age of Onset  . Diabetes Father   . Hypertension Father   . Colon cancer Father 29  . Hypertension Mother   . Other Mother        skin lesions  . Diabetes Mother        may be borderline  . Healthy Brother   . Healthy Brother   . Esophageal cancer Neg Hx   . Rectal cancer Neg Hx   . Stomach cancer Neg Hx    Social History:  reports that she has never smoked. She has never used smokeless tobacco. She reports that she does not drink alcohol and does not use drugs.  Allergies: No Known Allergies  Medications Prior to Admission  Medication Sig Dispense Refill  . acetaminophen (TYLENOL) 325 MG tablet Take 650 mg by mouth every 6 (six) hours as needed for headache or moderate pain.    . Ascorbic Acid (VITAMIN C PO) Take 1 tablet by mouth in the morning and at bedtime.    . carbamazepine (CARBATROL) 300 MG 12 hr capsule Take 300-600 mg by mouth See admin instructions. Take 1 capsule in the morning, and 2 capsules in the evening.    . Cholecalciferol (VITAMIN D) 2000 UNITS CAPS Take 2,000 Units by mouth 2 (two) times daily.    . folic  acid (FOLVITE) 1 MG tablet Take 1 mg by mouth daily.    . LamoTRIgine 300 MG TB24 24 hour tablet Take 300 mg by mouth daily.    . magnesium 30 MG tablet Take 30 mg by mouth daily.     . Multiple Vitamin (MULTIVITAMIN WITH MINERALS) TABS tablet Take 2 tablets by mouth daily.    Marland Kitchen amLODipine (NORVASC) 2.5 MG tablet Take 2.5 mg by mouth as needed.      Results for orders placed or performed during the hospital encounter of 07/15/20 (from the past 48 hour(s))  Comprehensive metabolic panel     Status: Abnormal   Collection Time: 07/15/20 11:25 AM  Result Value Ref Range   Sodium 131 (L) 135 - 145 mmol/L   Potassium 4.1 3.5 - 5.1 mmol/L   Chloride 99 98 - 111 mmol/L   CO2 24 22 - 32 mmol/L   Glucose, Bld 88 70 - 99 mg/dL    Comment: Glucose reference range applies only to samples taken after fasting for at least 8 hours.   BUN 16 8 - 23 mg/dL   Creatinine, Ser 0.72 0.44 - 1.00 mg/dL   Calcium 9.2 8.9 - 10.3  mg/dL   Total Protein 7.6 6.5 - 8.1 g/dL   Albumin 4.2 3.5 - 5.0 g/dL   AST 22 15 - 41 U/L   ALT 36 0 - 44 U/L   Alkaline Phosphatase 94 38 - 126 U/L   Total Bilirubin 0.6 0.3 - 1.2 mg/dL   GFR, Estimated >60 >60 mL/min    Comment: (NOTE) Calculated using the CKD-EPI Creatinine Equation (2021)    Anion gap 8 5 - 15    Comment: Performed at [facility], [place] [address], [place], [place] [address]   No results found.  Review of Systems  All other systems reviewed and are negative.   Blood pressure (!) 159/73, pulse 73, temperature 97.7 F (36.5 C), temperature source Oral, resp. rate 16, last menstrual period 08/29/2005, SpO2 100 %. Physical Exam HENT:     Head: Atraumatic.  Eyes:     Extraocular Movements: Extraocular movements intact.  Cardiovascular:     Pulses: Normal pulses.  Pulmonary:     Effort: Pulmonary effort is normal.  Musculoskeletal:     Comments: Right shoulder pain with limited range of motion  Skin:    General: Skin is warm and  dry.  Neurological:     Mental Status: She is alert.  Psychiatric:        Mood and Affect: Mood normal.      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  Failed conservative management.  Indicated for surgical treatment to decrease pain and restore function.  Plan right total shoulder arthroplasty Risks / benefits of surgery discussed Consent on chart  NPO for OR Preop antibiotics   Isabella Stalling, MD 07/15/2020, 11:48 AM

## 2020-07-17 ENCOUNTER — Emergency Department (HOSPITAL_COMMUNITY): Payer: Medicare Other

## 2020-07-17 ENCOUNTER — Other Ambulatory Visit: Payer: Self-pay

## 2020-07-17 ENCOUNTER — Emergency Department (HOSPITAL_COMMUNITY)
Admission: EM | Admit: 2020-07-17 | Discharge: 2020-07-17 | Disposition: A | Discharge status: Home / Self Care | Payer: Medicare Other | Attending: Emergency Medicine | Admitting: Emergency Medicine

## 2020-07-17 ENCOUNTER — Encounter (HOSPITAL_COMMUNITY): Payer: Self-pay | Admitting: Emergency Medicine

## 2020-07-17 DIAGNOSIS — G9341 Metabolic encephalopathy: Secondary | ICD-10-CM | POA: Diagnosis not present

## 2020-07-17 DIAGNOSIS — Z87898 Personal history of other specified conditions: Secondary | ICD-10-CM

## 2020-07-17 DIAGNOSIS — R0902 Hypoxemia: Secondary | ICD-10-CM | POA: Diagnosis not present

## 2020-07-17 DIAGNOSIS — I10 Essential (primary) hypertension: Secondary | ICD-10-CM | POA: Diagnosis not present

## 2020-07-17 DIAGNOSIS — Z86010 Personal history of colonic polyps: Secondary | ICD-10-CM | POA: Diagnosis not present

## 2020-07-17 DIAGNOSIS — M47816 Spondylosis without myelopathy or radiculopathy, lumbar region: Secondary | ICD-10-CM | POA: Diagnosis not present

## 2020-07-17 DIAGNOSIS — Z96643 Presence of artificial hip joint, bilateral: Secondary | ICD-10-CM | POA: Insufficient documentation

## 2020-07-17 DIAGNOSIS — Z950 Presence of cardiac pacemaker: Secondary | ICD-10-CM | POA: Insufficient documentation

## 2020-07-17 DIAGNOSIS — Z79899 Other long term (current) drug therapy: Secondary | ICD-10-CM | POA: Insufficient documentation

## 2020-07-17 DIAGNOSIS — Z8669 Personal history of other diseases of the nervous system and sense organs: Secondary | ICD-10-CM | POA: Insufficient documentation

## 2020-07-17 DIAGNOSIS — R531 Weakness: Secondary | ICD-10-CM | POA: Diagnosis not present

## 2020-07-17 DIAGNOSIS — R55 Syncope and collapse: Secondary | ICD-10-CM

## 2020-07-17 DIAGNOSIS — R600 Localized edema: Secondary | ICD-10-CM | POA: Diagnosis not present

## 2020-07-17 DIAGNOSIS — Z471 Aftercare following joint replacement surgery: Secondary | ICD-10-CM | POA: Diagnosis not present

## 2020-07-17 DIAGNOSIS — Z85828 Personal history of other malignant neoplasm of skin: Secondary | ICD-10-CM | POA: Insufficient documentation

## 2020-07-17 DIAGNOSIS — Z9889 Other specified postprocedural states: Secondary | ICD-10-CM | POA: Diagnosis not present

## 2020-07-17 DIAGNOSIS — R42 Dizziness and giddiness: Secondary | ICD-10-CM | POA: Diagnosis not present

## 2020-07-17 HISTORY — DX: Metabolic encephalopathy: G93.41

## 2020-07-17 LAB — COMPREHENSIVE METABOLIC PANEL
ALT: 55 U/L — ABNORMAL HIGH (ref 0–44)
AST: 47 U/L — ABNORMAL HIGH (ref 15–41)
Albumin: 3.4 g/dL — ABNORMAL LOW (ref 3.5–5.0)
Alkaline Phosphatase: 80 U/L (ref 38–126)
Anion gap: 7 (ref 5–15)
BUN: 9 mg/dL (ref 8–23)
CO2: 26 mmol/L (ref 22–32)
Calcium: 8.8 mg/dL — ABNORMAL LOW (ref 8.9–10.3)
Chloride: 97 mmol/L — ABNORMAL LOW (ref 98–111)
Creatinine, Ser: 0.8 mg/dL (ref 0.44–1.00)
GFR, Estimated: >60 mL/min (ref >60)
Glucose, Bld: 127 mg/dL — ABNORMAL HIGH (ref 70–99)
Potassium: 4.1 mmol/L (ref 3.5–5.1)
Sodium: 130 mmol/L — ABNORMAL LOW (ref 135–145)
Total Bilirubin: 0.9 mg/dL (ref 0.3–1.2)
Total Protein: 6.4 g/dL — ABNORMAL LOW (ref 6.5–8.1)

## 2020-07-17 LAB — CBC
HCT: 32.4 % — ABNORMAL LOW (ref 36.0–46.0)
Hemoglobin: 11 g/dL — ABNORMAL LOW (ref 12.0–15.0)
MCH: 31.2 pg (ref 26.0–34.0)
MCHC: 34 g/dL (ref 30.0–36.0)
MCV: 91.8 fL (ref 80.0–100.0)
Platelets: 256 10*3/uL (ref 150–400)
RBC: 3.53 MIL/uL — ABNORMAL LOW (ref 3.87–5.11)
RDW: 14.7 % (ref 11.5–15.5)
WBC: 9 10*3/uL (ref 4.0–10.5)
nRBC: 0 % (ref 0.0–0.2)

## 2020-07-17 LAB — RAPID URINE DRUG SCREEN, HOSP PERFORMED
Amphetamines: NOT DETECTED
Barbiturates: NOT DETECTED
Benzodiazepines: POSITIVE — AB
Cocaine: NOT DETECTED
Opiates: NOT DETECTED
Tetrahydrocannabinol: NOT DETECTED

## 2020-07-17 LAB — LIPASE, BLOOD: Lipase: 27 U/L (ref 11–51)

## 2020-07-17 LAB — ACETAMINOPHEN LEVEL: Acetaminophen (Tylenol), Serum: <10 ug/mL — ABNORMAL LOW (ref 10–30)

## 2020-07-17 MED ORDER — ONDANSETRON HCL 4 MG/2ML IJ SOLN
4.0000 mg | Freq: Once | INTRAMUSCULAR | Status: AC
  Administered 2020-07-17: 4 mg via INTRAVENOUS
  Filled 2020-07-17: qty 2

## 2020-07-17 MED ORDER — NALOXONE HCL 0.4 MG/ML IJ SOLN
0.4000 mg | Freq: Once | INTRAMUSCULAR | Status: DC
  Filled 2020-07-17: qty 1

## 2020-07-17 MED ORDER — SODIUM CHLORIDE 0.9 % IV BOLUS
500.0000 mL | Freq: Once | INTRAVENOUS | Status: AC
  Administered 2020-07-17: 500 mL via INTRAVENOUS

## 2020-07-17 NOTE — ED Provider Notes (Signed)
[facility] EMERGENCY DEPARTMENT Provider Note   CSN: [id number] Arrival date & time: 07/17/20  4132     History Chief Complaint  Patient presents with  . Loss of Consciousness    Chanita Boden is a 65 y.o. female.  HPI Level 5 caveat secondary to the patient being deaf. History obtained from daughter who is at bedside lives in the house. Patient is unable to give any history because she is currently with her eyes closed and does not spontaneously open. I was able to hold the patient's eyes open and tilt her head towards her daughter she seemed to give some results points to signing. The ASL translator computer is in the room, but not utilized initially as patient is not keeping her eyes open   65 year old female history of deafness, seizures, status post pacemaker, right shoulder surgery 2 days ago presents today with syncopal episode.  Her daughter states that she was doing well yesterday this morning she was weak and she had to help her to the bathroom.  She had a syncopal episode and was unresponsive for several minutes.  She has had intermittent responsivity since that time.  Her daughter states this could be like her prior seizures but is not entirely sure.  She states that her prior seizures also involved syncope without any shaking. Past Medical History:  Diagnosis Date  . Chicken pox   . Conductive hearing loss, childhood onset   . 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 Epilepsy, followed by Dr. Tomma Rakers in neurology 08/26/2012  . Hx of adenomatous colonic polyps 11/19/2016  . Pacemaker   . Pacemaker    2012  . Seizure ([place])   . Seizures ([place])    Petite Mal     Patient Active Problem List   Diagnosis Date Noted  . 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 Hx of adenomatous colonic polyps 11/19/2016  . Localization-related idiopathic epilepsy and epileptic syndromes with seizures of localized onset, not intractable, without status  epilepticus ([place]) 01/01/2015  . 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 Dizziness and giddiness 01/01/2015  . Partial epilepsy with impairment of consciousness ([place]) 09/12/2013  . 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 Deaf 08/26/2012  . Migraine 08/26/2012  . Sinus node dysfunction ([place]) 08/26/2012  . H/O bilateral hip replacements 08/26/2012    Past Surgical History:  Procedure Laterality Date  . HIP ARTHROPLASTY    . PACEMAKER INSERTION  07/2010  . TOTAL HIP ARTHROPLASTY  07/2011   right  . TOTAL HIP ARTHROPLASTY  03/2012   left     OB History    Gravida  0   Para  0   Term  0   Preterm  0   AB  0   Living        SAB  0   IAB  0   Ectopic  0   Multiple      Live Births              Family History  Problem Relation Age of Onset  . Diabetes Father   . Hypertension Father   . Colon cancer Father 29  . Hypertension Mother   . Other Mother        skin lesions  . Diabetes Mother        may be borderline  . Healthy Brother   . Healthy Brother   . Esophageal cancer Neg Hx   . Rectal cancer  Neg Hx   . Stomach cancer Neg Hx     Social History   Tobacco Use  . Smoking status: Never Smoker  . Smokeless tobacco: Never Used  Vaping Use  . Vaping Use: Never used  Substance Use Topics  . Alcohol use: No    Alcohol/week: 0.0 standard drinks    Comment: none  . Drug use: No    Home Medications Prior to Admission medications   Medication Sig Start Date End Date Taking? Authorizing Provider  amLODipine (NORVASC) 2.5 MG tablet Take 2.5 mg by mouth as needed.    [provider]  Ascorbic Acid (VITAMIN C PO) Take 1 tablet by mouth in the morning and at bedtime.    [provider]  carbamazepine (CARBATROL) 300 MG 12 hr capsule Take 300-600 mg by mouth See admin instructions. Take 1 capsule in the morning, and 2 capsules in the evening.    [provider]  Cholecalciferol (VITAMIN D) 2000 UNITS CAPS Take 2,000 Units by mouth 2 (two)  times daily.    [provider]  folic acid (FOLVITE) 1 MG tablet Take 1 mg by mouth daily.    [provider]  LamoTRIgine 300 MG TB24 24 hour tablet Take 300 mg by mouth daily.    [provider]  magnesium 30 MG tablet Take 30 mg by mouth daily.     [provider]  Multiple Vitamin (MULTIVITAMIN WITH MINERALS) TABS tablet Take 2 tablets by mouth daily.    [provider]  oxyCODONE-acetaminophen (PERCOCET) 5-325 MG tablet Take 1-2 tablets every 4 hours as needed for post operative pain. MAX 6/day 07/15/20   Grier Mitts, PA-C  tiZANidine (ZANAFLEX) 4 MG tablet Take 1 tablet (4 mg total) by mouth every 8 (eight) hours as needed for muscle spasms. 07/15/20   Grier Mitts, PA-C    Allergies    Patient has no known allergies.  Review of Systems   Review of Systems  Unable to perform ROS: Mental status change    Physical Exam Updated Vital Signs BP (!) 137/58 (BP Location: Right Arm)   Pulse 76   Temp 98.1 F (36.7 C) (Oral)   Resp 20   LMP 08/29/2005   SpO2 100%   Physical Exam Vitals and nursing note reviewed.  Constitutional:      General: She is not in acute distress.    Appearance: She is ill-appearing.  HENT:     Head: Normocephalic and atraumatic.     Right Ear: External ear normal.     Left Ear: External ear normal.     Nose: Nose normal.     Mouth/Throat:     Mouth: Mucous membranes are moist.     Pharynx: Oropharynx is clear.  Eyes:     Pupils: Pupils are equal, round, and reactive to light.  Cardiovascular:     Rate and Rhythm: Normal rate and regular rhythm.     Pulses: Normal pulses.     Comments: Left chest wall with palpable foreign body consistent with pacemaker No tenderness, erythema, surrounding pocket Pulmonary:     Effort: Pulmonary effort is normal.     Breath sounds: Normal breath sounds.  Abdominal:     General: Bowel sounds are normal. There is no distension.     Palpations: Abdomen is  soft.     Tenderness: There is no abdominal tenderness.  Musculoskeletal:     Cervical back: Normal range of motion.     Comments: Patient appears  to move fingers and toes on the left upper extremity and bilateral lower extremities Right shoulder is in sling Pulses are palpable There is mild redness and tenderness consistent with recent surgery  Skin:    General: Skin is warm.     Capillary Refill: Capillary refill takes less than 2 seconds.     ED Results / Procedures / Treatments   Labs (all labs ordered are listed, but only abnormal results are displayed) Labs Reviewed  CBC - Abnormal; Notable for the following components:      Result Value   RBC 3.53 (*)    Hemoglobin 11.0 (*)    HCT 32.4 (*)    All other components within normal limits  COMPREHENSIVE METABOLIC PANEL - Abnormal; Notable for the following components:   Sodium 130 (*)    Chloride 97 (*)    Glucose, Bld 127 (*)    Calcium 8.8 (*)    Total Protein 6.4 (*)    Albumin 3.4 (*)    AST 47 (*)    ALT 55 (*)    All other components within normal limits    EKG EKG Interpretation  Date/Time:  Saturday July 17 2020 09:29:46 EDT Ventricular Rate:  75 PR Interval:    QRS Duration: 96 QT Interval:  401 QTC Calculation: 448 R Axis:   12 Text Interpretation: Sinus rhythm Prolonged PR interval Confirmed by Pattricia Boss [phone]) on 07/17/2020 9:58:23 AM   Radiology DG Shoulder Right Port  Result Date: 07/15/2020 CLINICAL DATA:  Postop. EXAM: PORTABLE RIGHT SHOULDER COMPARISON:  Preoperative shoulder CT 07/10/2020 FINDINGS: Right humeral head arthroplasty in expected alignment. No periprosthetic lucency or fracture. Recent postsurgical change includes air and edema in the soft tissues. IMPRESSION: Right humeral head arthroplasty without immediate postoperative complication. Electronically Signed   By: Keith Rake M.D.   On: 07/15/2020 16:21    Procedures Procedures   Medications Ordered in ED Medications   sodium chloride 0.9 % bolus 500 mL (has no administration in time range)    ED Course  I have reviewed the triage vital signs and the nursing notes.  Pertinent labs & imaging results that were available during my care of the patient were reviewed by me and considered in my medical decision making (see chart for details).  12:09 PM Able to speak with patient through [place] interpreter.  Patient is perseverating that she has not had dinner and needs food.  However, she vomits multiple times during the interview. She denies any pain.  She states her shoulder does not hurt. Her daughter reports that she only took 1 pain pill. Clinical Course as of 07/17/20 1212  Sat Jul 17, 2020  1210 Reviewed labs noted mild hyponatremia hyperglyceridemia Mildly elevated LFTs Hypocalcemia Mild anemia  [DR]    Clinical Course User Index [DR] Pattricia Boss, MD   MDM Rules/Calculators/A&P                         Suspect patient's confusion is some combination of mild hyponatremia, post op pain medicine use, nausea, and vomiting.  Discussed with Dr. Jamse Arn who requested neurology consult and urinalysis.  Nursing advised to obtain in and out catheterization. Discussed with With neurology who will see for evaluation EEG results pending In-N-Out cath pending and urinalysis  Narcan to be given Dr. Vallery Ridge will call Dr. Jamse Arn back after above results Final Clinical Impression(s) / ED Diagnoses Final diagnoses:  Metabolic encephalopathy    Rx / DC Orders  ED Discharge Orders    None       Pattricia Boss, MD 07/17/20 [phone]

## 2020-07-17 NOTE — ED Notes (Signed)
EEG remains at bedside.

## 2020-07-17 NOTE — ED Notes (Signed)
Patient verbalizes understanding of discharge instructions. Opportunity for questioning and answers were provided. Armband removed by staff, pt discharged from ED.  

## 2020-07-17 NOTE — ED Notes (Signed)
Assisted pt getting dressed and  to the bathroom and then out front for a pickup with her dtr.

## 2020-07-17 NOTE — ED Notes (Signed)
Pt PO challenged with no emesis. EEG at bedside. Explained to pt we need urine.

## 2020-07-17 NOTE — Procedures (Signed)
[name] [name] is a 65 y.o. female with a history of seizures per pt report w/ semiology of syncope who is undergoing an EEG to evaluate for seizures.  Report: This EEG was acquired with electrodes placed according to the International 10-20 electrode system (including Fp1, Fp2, F3, F4, C3, C4, P3, P4, O1, O2, T3, T4, T5, T6, A1, A2, Fz, Cz, Pz). The following electrodes were missing or displaced: none.  The occipital dominant rhythm was 7-8 Hz. This activity is reactive to stimulation. Drowsiness was manifested by background fragmentation; deeper stages of sleep were not identified. There was no focal slowing. There were no interictal epileptiform discharges. There were no electrographic seizures identified. There was no abnormal response to photic stimulation or hyperventilation.   Impression: This EEG was obtained while awake and drowsy and is abnormal due to mild diffuse slowing.     Clinical Correlation: This EEG indicates mild global cerebral dysfunction. No epileptiform abnormalities were identified.  Su Monks, MD [facility] [phone]

## 2020-07-17 NOTE — ED Triage Notes (Signed)
[facility] reports pt is deaf. Pt had Rt shoulder sx on Thur. The pain medicine bottle was unlabeled and had 2 piills left. Pt has had multiple syncopal episodes with no falls. She is lethargic.. One episode of emesis. Pt has pacer of unknown origin. V/S cbg 131 A&O through signing with dtr.

## 2020-07-17 NOTE — ED Provider Notes (Signed)
Patient had episode today of several minutes of unresponsiveness.  Since that time she has been somnolent.  There is additional difficulty communication due to patient being deaf and requiring ASL translator.   Dr. Jeanell Sparrow has initiated extensive evaluation including neurology consultation.  Patient has had EEG with results pending and urinalysis pending.  Dr. Jeanell Sparrow has reviewed admission with Dr. Jamse Arn who has requested EEG, neurology consult and urinalysis for admission to help clarify diagnosis of encephalopathy, possible multifactorial.  Physical Exam  BP 133/66   Pulse 67   Temp 98.1 F (36.7 C) (Oral)   Resp 19   Ht 5\' 2"  (1.575 m)   Wt 70.8 kg   LMP 08/29/2005   SpO2 100%   BMI 28.53 kg/m   Physical Exam  ED Course/Procedures   Clinical Course as of 07/17/20 1508  Sat Jul 17, 2020  1210 Reviewed labs noted mild hyponatremia hyperglyceridemia Mildly elevated LFTs Hypocalcemia Mild anemia  [DR]    Clinical Course User Index [DR] Pattricia Boss, MD    Procedures  MDM  Patient has had neurology consultation.  She has returned to normal baseline mental status.  EEG does not show any ongoing seizure activity.  Patient's chemistries and lab profiles are similar to baseline without significant electrolyte derangement.  Her baseline sodium ranges around 130.  Patient returned to normal mental status, she is eager to go home.  At this time I have reviewed possible causes for symptoms including postictal phase, oversedation from medication or possible other early illness.  Careful return precautions reviewed.  Patient will be discharged with recommendation for close follow-up with her neurologist.      Charlesetta Shanks, MD 07/17/20 [place]

## 2020-07-17 NOTE — ED Notes (Signed)
Will update pts vitals after X-Ray

## 2020-07-17 NOTE — Consult Note (Signed)
Neurology Consultation  Reason for Consult: seizure Referring Physician: Hart Robinsons, ED MD  CC: encephalopathy  History is obtained from: chart, daughter and patient    HPI: [name] [name] is a 65 y.o. female with a PMHx of seizure disorder which per patient is always associated with syncope, sinus arrhythmia s/p PPM, and HTN. She just had a right humeral head arthroplasty 2 days ago. Daughter acts as sign Nutritional therapist as patient is deaf.   Patient sees Dr. Tomma Rakers frequently for seizures. She just saw him last week for surgical clearance. She states she takes her seizure medication as ordered. Did not have this morning's doses due to event. 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 (NP located an outpt noted from [facility] neurology for 2020, but she sees Dr. Tomma Rakers now). Has been on seizure medications for years. Denies any history of tonic/clonic seizures and at one place on chart, NP sees "petit mal" documented. Per Velora Heckler note, her seizures are labeled as complex partial and syncope is thought to be either secondary to seizures or her history of dizziness. Spells per note described as staring and being shortly unresponsive. Daughter has witnessed a seizure in the past where she states patient goes unresponsive and later is arousable. No shaking with event.   No notes from Dr. Tomma Rakers in care everywhere.   This morning, daughter was helping patient to the bathroom and patient passed out before reaching the toilet. Daughter eased her to the floor and called 911. Daughter states that patient was in usual state of health immediately prior to this event. Daughter states she had LOC, but woke up a bit, but remained very lethargic. She has an episode of n/v at home.   Per daughter, her uncle checked the patient's post op narcotic bottle and counted the pills, and the patient has not taken any since surgery. When called for consult, patient was very lethargic per MD's note  at 0910 hours. Per daughter, around 3pm, patient woke up and has been her normal self since then. Upon exam, patient is alert and oriented and appears well. Narcan was not given due to patient being alert now.   Workup in ED, revealed mild hyponatremia of 130, elevated LFTs, hypocalcemia and mild anemia. A UA has been ordered to check for infection. Given LFTs, patient denies ETOH use. 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    ROS: A 14 point ROS was performed and is negative except as noted in the HPI.   Past Medical History:  Diagnosis Date  . Chicken pox   . Conductive hearing loss, childhood onset   . 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 Epilepsy, followed by Dr. Tomma Rakers in neurology 08/26/2012  . Hx of adenomatous colonic polyps 11/19/2016  . Pacemaker   . Pacemaker    2012  . Seizure ([place])   . Seizures ([place])    Petite Mal     Family History  Problem Relation Age of Onset  . Diabetes Father   . Hypertension Father   . Colon cancer Father 65  . Hypertension Mother   . Other Mother        skin lesions  . Diabetes Mother        may be borderline  . Healthy Brother   . Healthy Brother   . Esophageal cancer Neg Hx   . Rectal cancer Neg Hx   . Stomach cancer Neg  Hx    Social History:   reports that she has never smoked. She has never used smokeless tobacco. She reports that she does not drink alcohol and does not use drugs.  Medications No current facility-administered medications for this encounter.  Current Outpatient Medications:  .  amLODipine (NORVASC) 2.5 MG tablet, Take 2.5 mg by mouth as needed., Disp: , Rfl:  .  Ascorbic Acid (VITAMIN C PO), Take 1 tablet by mouth in the morning and at bedtime., Disp: , Rfl:  .  Cholecalciferol (VITAMIN D) 2000 UNITS CAPS, Take 2,000 Units by mouth 2 (two) times daily., Disp: , Rfl:  .  folic acid (FOLVITE) 1 MG tablet, Take 1 mg by mouth daily., Disp: , Rfl:  .   LamoTRIgine 300 MG TB24 24 hour tablet, Take 300 mg by mouth daily., Disp: , Rfl:  .  magnesium 30 MG tablet, Take 30 mg by mouth daily. , Disp: , Rfl:  .  Multiple Vitamin (MULTIVITAMIN WITH MINERALS) TABS tablet, Take 2 tablets by mouth daily., Disp: , Rfl:  .  Oxcarbazepine (TRILEPTAL) 300 MG tablet, Take 300 mg by mouth See admin instructions. Take one tablet by mouth in the morning and 2 in the evening., Disp: , Rfl:  .  oxyCODONE-acetaminophen (PERCOCET) 5-325 MG tablet, Take 1-2 tablets every 4 hours as needed for post operative pain. MAX 6/day, Disp: 30 tablet, Rfl: 0 .  carbamazepine (CARBATROL) 300 MG 12 hr capsule, Take 300-600 mg by mouth See admin instructions. Take 1 capsule in the morning, and 2 capsules in the evening., Disp: , Rfl:  .  tiZANidine (ZANAFLEX) 4 MG tablet, Take 1 tablet (4 mg total) by mouth every 8 (eight) hours as needed for muscle spasms. (Patient not taking: Reported on 07/17/2020), Disp: 30 tablet, Rfl: 1   Exam: Current vital signs: BP 140/61   Pulse 70   Temp 98.1 F (36.7 C) (Oral)   Resp 20   Ht 5\' 2"  (1.575 m)   Wt 70.8 kg   LMP 08/29/2005   SpO2 100%   BMI 28.53 kg/m  Vital signs in last 24 hours: Temp:  [98.1 F (36.7 C)] 98.1 F (36.7 C) (03/19 0914) Pulse Rate:  [65-82] 70 (03/19 1630) Resp:  [14-26] 20 (03/19 1630) BP: (118-145)/(58-93) 140/61 (03/19 1630) SpO2:  [97 %-100 %] 100 % (03/19 1630) Weight:  [70.8 kg] 70.8 kg (03/19 0926)  GENERAL: Awake, alert in NAD HEENT: - Normocephalic and atraumatic, moist mucous membranes, no lymphadenopathy LUNGS - Normal respiratory effort.  CV - RRR  ABDOMEN - Soft, nontender Ext: warm, well perfused Psych: Affect appropriate to situation.  NEURO:  Mental Status: Awake, alert, and oriented times 3.  Speech/Language: speech is mute due to deafness.  Cranial Nerves:  II: PERRL 5mm/brisk. visual fields full. III, IV, VI: EOMI. Lid elevation symmetric and full.  V: sensation is intact and  symmetrical to face. Moves jaw back and forth.  VII: Smile is symmetrical. Able to puff cheeks and raise eyebrows.  VIII:hearing intact to voice IX, X: palate elevation is symmetric. Phonation normal.  XI: normal sternocleidomastoid and trapezius muscle strength JOI:NOMVEH is symmetrical without fasciculations.   Motor: 5/5 strength is all muscle groups.  Tone is normal. Bulk is normal.  Sensation- Intact to light touch bilaterally in all four extremities. Extinction absent to light touch to DSS.  Coordination: FTN intact bilaterally. HKS intact bilaterally. No drift.  Cerebellar: No tremor, clonus Gait- deferred  CBC    Component Value Date/Time  WBC 9.0 07/17/2020 0948   RBC 3.53 (L) 07/17/2020 0948   HGB 11.0 (L) 07/17/2020 0948   HCT 32.4 (L) 07/17/2020 0948   PLT 256 07/17/2020 0948   MCV 91.8 07/17/2020 0948   MCH 31.2 07/17/2020 0948   MCHC 34.0 07/17/2020 0948   RDW 14.7 07/17/2020 0948   LYMPHSABS 1.8 07/08/2020 1456   MONOABS 0.5 07/08/2020 1456   EOSABS 0.0 07/08/2020 1456   BASOSABS 0.0 07/08/2020 1456    CMP     Component Value Date/Time   NA 130 (L) 07/17/2020 0948   K 4.1 07/17/2020 0948   CL 97 (L) 07/17/2020 0948   CO2 26 07/17/2020 0948   GLUCOSE 127 (H) 07/17/2020 0948   BUN 9 07/17/2020 0948   CREATININE 0.80 07/17/2020 0948   CREATININE 0.85 01/09/2020 1026   CALCIUM 8.8 (L) 07/17/2020 0948   PROT 6.4 (L) 07/17/2020 0948   ALBUMIN 3.4 (L) 07/17/2020 0948   AST 47 (H) 07/17/2020 0948   ALT 55 (H) 07/17/2020 0948   ALKPHOS 80 07/17/2020 0948   BILITOT 0.9 07/17/2020 0948   GFRNONAA >60 07/17/2020 0948   GFRNONAA 71 09/12/2013 1616   GFRAA >90 03/29/2014 1933   GFRAA 82 09/12/2013 1616   Imaging  CT head 1. No acute intracranial abnormality. 2. Small areas of chronic encephalomalacia suspected in the left PCA and left MCA/PCA watershed areas.  EEG This EEG was obtained while awake and drowsy and is abnormal due to mild diffuse slowing.     Clinical Correlation: This EEG indicates mild global cerebral dysfunction. No epileptiform abnormalities were identified.  Assessment: 65 yo female who presented to ED this am after a syncopal episode at home. In the past, her seizures have been associated with syncope, but this is somewhat refuted by old neurology out patient note, stating the syncope could be secondary to her dizziness. Her seizures are described as temporal lobe epilepsy and complex partial. She is compliant with her medications except morning dose today due to syncope. She follows regularly with Dr. Tomma Rakers in [facility] system. Her CTH this am was negative for acute finding. Her event today is typical of her old seizures and given CTH is negative, patient needs no further in patient neurological workup. Will defer admission to hospital to ED MD and hospitalist.   Impression: 1. Temporal lobe epilepsy 2. Complex partial seizures per an old neuro outpt note  Recommendations: -EEG postitive for slowing, negative for epileptiform discharges -continue present AEDs at home doses and have patient f/up with Dr. Tomma Rakers in 1-2 weeks for any further out patient work up.   Pt seen by Clance Boll, NP/Neuro and EEG read by Dr. Quinn Axe. Plan was discussed and formulated with Dr. Quinn Axe. Communicated plan with ED MD.  Pager: [phone]

## 2020-07-17 NOTE — Discharge Instructions (Addendum)
At this time, the exact cause of your episode of confusion is unclear.  This may have been a post ictal phase after a seizure.  Sometimes seizures and periods of mental status change are different from your usual presentation.  It is very important that you follow-up with your neurologist as soon as possible for further evaluation.  You did have an EEG in the emergency department today and it did not show persistent seizure activity. Sometimes combinations of medications can lead to oversedation.  Try to limit your pain medication and muscle relaxers to lowest dose as needed to control your symptoms.  Review your medications in their entirety with your doctor as soon as possible. Sometimes changes in level of alertness are the first signs of other illness.  At this time, you are back to normal and do not appear to be ill.  If any concerning symptoms develop or there is recurrence of a similar episode, return to the emergency department immediately.

## 2020-07-17 NOTE — ED Notes (Signed)
EDP at bedside  

## 2020-07-17 NOTE — ED Notes (Signed)
Notified EDP that pt refused purewick. They will call out when pt needs to urinate.

## 2020-07-17 NOTE — ED Notes (Signed)
Patient transported to CT 

## 2020-07-17 NOTE — ED Notes (Signed)
EEG finished at bedside

## 2020-07-17 NOTE — ED Notes (Signed)
Assisted pt to use bsc.

## 2020-07-17 NOTE — Progress Notes (Signed)
EEG complete - results pending 

## 2020-07-19 ENCOUNTER — Encounter (HOSPITAL_COMMUNITY): Payer: Self-pay | Admitting: Orthopedic Surgery

## 2020-07-20 ENCOUNTER — Other Ambulatory Visit: Payer: Medicare Other

## 2020-07-20 ENCOUNTER — Other Ambulatory Visit: Payer: Self-pay | Admitting: Neurology

## 2020-07-22 NOTE — Progress Notes (Signed)
Repeat liver eval was normal

## 2020-07-23 ENCOUNTER — Other Ambulatory Visit: Payer: Self-pay

## 2020-07-23 MED ORDER — OXCARBAZEPINE 300 MG PO TABS: 300.0000 mg | ORAL_TABLET | ORAL | 2 refills | Status: DC

## 2020-07-23 MED ORDER — CARBAMAZEPINE ER 300 MG PO CP12: 300.0000 mg | ORAL_CAPSULE | ORAL | 2 refills | Status: DC

## 2020-07-26 ENCOUNTER — Other Ambulatory Visit: Payer: Self-pay | Admitting: Neurology

## 2020-07-26 NOTE — Telephone Encounter (Signed)
Patient's mom called to check on the status of this request. She said she's been trying to get refill for two weeks.

## 2020-07-28 DIAGNOSIS — Z471 Aftercare following joint replacement surgery: Secondary | ICD-10-CM | POA: Diagnosis not present

## 2020-07-28 DIAGNOSIS — Z96611 Presence of right artificial shoulder joint: Secondary | ICD-10-CM | POA: Diagnosis not present

## 2020-08-02 ENCOUNTER — Other Ambulatory Visit: Payer: Self-pay

## 2020-08-02 ENCOUNTER — Other Ambulatory Visit (INDEPENDENT_AMBULATORY_CARE_PROVIDER_SITE_OTHER): Payer: Medicare Other

## 2020-08-02 DIAGNOSIS — R748 Abnormal levels of other serum enzymes: Secondary | ICD-10-CM | POA: Diagnosis not present

## 2020-08-02 LAB — GAMMA GT: GGT: 139 U/L — ABNORMAL HIGH (ref 7–51)

## 2020-08-02 LAB — HEPATIC FUNCTION PANEL
ALT: 16 U/L (ref 0–35)
AST: 20 U/L (ref 0–37)
Albumin: 4.1 g/dL (ref 3.5–5.2)
Alkaline Phosphatase: 126 U/L — ABNORMAL HIGH (ref 39–117)
Bilirubin, Direct: 0.1 mg/dL (ref 0.0–0.3)
Total Bilirubin: 0.4 mg/dL (ref 0.2–1.2)
Total Protein: 7 g/dL (ref 6.0–8.3)

## 2020-08-03 ENCOUNTER — Other Ambulatory Visit: Payer: Self-pay | Admitting: Family Medicine

## 2020-08-03 DIAGNOSIS — R7989 Other specified abnormal findings of blood chemistry: Secondary | ICD-10-CM

## 2020-08-04 ENCOUNTER — Telehealth: Payer: Self-pay | Admitting: Family Medicine

## 2020-08-04 NOTE — Telephone Encounter (Signed)
Spoke with the pts mother and informed her of the message below and someone will call with appt info for the [place].  Patients mother stated the pt did not understand why she was taking a blood pressure medication, does not want to take this and states her BP is not high at home when she checks it.  I advised her to let the pt know to have the intepreter know what her questions are during each visit.  I suggested to her if possible to attend the visit with the pt and bring the BP cuff she uses at home and she agreed.  I also suggested a follow up appt be scheduled after the [place] to discuss the pts concerns with the mother present also.

## 2020-08-04 NOTE — Telephone Encounter (Signed)
Patients mother would like a call back and get a clear understand about the order for the ultrasound that the patient was not aware of.

## 2020-08-04 NOTE — Telephone Encounter (Signed)
We are checking [place] to make sure liver looks ok due to some elevated liver enzymes. They have fluctuated some but we still have a little elevation. May be related to her medications, but we want to make sure liver looks ok (sometimes we will see cysts or growths in liver that can cause elevation).

## 2020-08-24 ENCOUNTER — Ambulatory Visit (INDEPENDENT_AMBULATORY_CARE_PROVIDER_SITE_OTHER): Payer: Medicare Other

## 2020-08-24 ENCOUNTER — Ambulatory Visit
Admission: RE | Admit: 2020-08-24 | Discharge: 2020-08-24 | Disposition: A | Discharge status: Home / Self Care | Payer: Medicare Other | Source: Ambulatory Visit | Attending: Family Medicine | Admitting: Family Medicine

## 2020-08-24 ENCOUNTER — Other Ambulatory Visit: Payer: Self-pay

## 2020-08-24 DIAGNOSIS — R7989 Other specified abnormal findings of blood chemistry: Secondary | ICD-10-CM

## 2020-08-24 DIAGNOSIS — I495 Sick sinus syndrome: Secondary | ICD-10-CM

## 2020-08-24 DIAGNOSIS — K8020 Calculus of gallbladder without cholecystitis without obstruction: Secondary | ICD-10-CM | POA: Diagnosis not present

## 2020-08-25 DIAGNOSIS — Z9889 Other specified postprocedural states: Secondary | ICD-10-CM | POA: Diagnosis not present

## 2020-08-25 LAB — CUP PACEART REMOTE DEVICE CHECK
Battery Impedance: 1065 Ohm
Battery Remaining Longevity: 70 mo
Battery Voltage: 2.76 V
Brady Statistic AP VP Percent: 0 %
Brady Statistic AP VS Percent: 0 %
Brady Statistic AS VP Percent: 0 %
Brady Statistic AS VS Percent: 100 %
Date Time Interrogation Session: 20220426173624
Implantable Lead Implant Date: 20120409
Implantable Lead Implant Date: 20120409
Implantable Lead Location: 753859
Implantable Lead Location: 753860
Implantable Lead Model: 4076
Implantable Lead Model: 4076
Implantable Pulse Generator Implant Date: 20120409
Lead Channel Impedance Value: 572 Ohm
Lead Channel Impedance Value: 622 Ohm
Lead Channel Pacing Threshold Amplitude: 0.5 V
Lead Channel Pacing Threshold Amplitude: 0.625 V
Lead Channel Pacing Threshold Pulse Width: 0.4 ms
Lead Channel Pacing Threshold Pulse Width: 0.4 ms
Lead Channel Setting Pacing Amplitude: 2 V
Lead Channel Setting Pacing Amplitude: 2.5 V
Lead Channel Setting Pacing Pulse Width: 0.4 ms
Lead Channel Setting Sensing Sensitivity: 5.6 mV

## 2020-08-26 ENCOUNTER — Encounter: Payer: Self-pay | Admitting: Physical Therapy

## 2020-08-26 ENCOUNTER — Ambulatory Visit: Payer: Medicare Other | Attending: Orthopedic Surgery | Admitting: Physical Therapy

## 2020-08-26 ENCOUNTER — Other Ambulatory Visit: Payer: Self-pay

## 2020-08-26 DIAGNOSIS — M6281 Muscle weakness (generalized): Secondary | ICD-10-CM | POA: Diagnosis not present

## 2020-08-26 DIAGNOSIS — M25511 Pain in right shoulder: Secondary | ICD-10-CM | POA: Diagnosis not present

## 2020-08-26 DIAGNOSIS — M25611 Stiffness of right shoulder, not elsewhere classified: Secondary | ICD-10-CM | POA: Diagnosis not present

## 2020-08-26 NOTE — Therapy (Addendum)
[facility] [address] [place], [place], [address] Phone: [phone]   Fax:  [phone]  Physical Therapy Evaluation  Patient Details  Name: [name] [name] MRN: [id number] Date of Birth: Jun 06, 1955 Referring Provider (PT): Jones Broom, MD   Encounter Date: 08/26/2020   PT End of Session - 08/26/20 0918     Visit Number 1    Number of Visits 13    Date for PT Re-Evaluation 10/21/20    Authorization Type [employer] MCR/MCD    Authorization Time Period KX by 15th    Progress Note Due on Visit 10    PT Start Time 0918    PT Stop Time 1009    PT Time Calculation (min) 51 min    Activity Tolerance Patient tolerated treatment well    Behavior During Therapy [facility] for tasks assessed/performed             Past Medical History:  Diagnosis Date   Chicken pox    Conductive hearing loss, childhood onset    Deafness    from medication a a child for chicken pox   Epilepsy, followed by Dr. Smiley Houseman in neurology 08/26/2012   Hx of adenomatous colonic polyps 11/19/2016   Pacemaker    Pacemaker    2012   Seizure (HCC)    Seizures (HCC)    Petite Mal     Past Surgical History:  Procedure Laterality Date   HIP ARTHROPLASTY     PACEMAKER INSERTION  07/2010   TOTAL HIP ARTHROPLASTY  07/2011   right   TOTAL HIP ARTHROPLASTY  03/2012   left   TOTAL SHOULDER ARTHROPLASTY Right 07/15/2020   Procedure: TOTAL SHOULDER ARTHROPLASTY;  Surgeon: Jones Broom, MD;  Location: [facility];  Service: Orthopedics;  Laterality: Right;  NEED 120 MINUTES    There were no vitals filed for this visit.    Subjective Assessment - 08/26/20 0921     Subjective I had a R shoulder replacement on 07/15/20. I've had check ups with the surgeon and he says it looks good, I just need to ice it more because it looks swollen. He gave me these exercises (does bicep curls, pronation/supination, wrist flexion/extension, but took her awhile to remember so unclear if she  performs these).    Patient is accompained by: Interpreter   in-person sign language interpeter   Pertinent History bilateral hip replacements, deafness, pacemaker, seizures    Limitations Lifting;House hold activities    Patient Stated Goals pain to be resolved, pick up heavy things to 25#, get object out of cabinet with right arm    Currently in Pain? Yes    Pain Score 5     Pain Location Shoulder    Pain Orientation Right   back of shoulder   Pain Descriptors / Indicators Cramping;Tightness    Pain Type Surgical pain    Pain Onset More than a month ago    Pain Frequency Constant    Aggravating Factors  exercises (about a 10-pain in the top back of the shoulder), opening doors, high cabinents    Pain Relieving Factors ice    Effect of Pain on Daily Activities ADLs/IADLs, overhead activities                  [facility] PT Assessment - 08/26/20 0001       Assessment   Medical Diagnosis Right total shoulder replacement    Referring Provider (PT) Jones Broom, MD    Onset Date/Surgical Date 07/15/20  Hand Dominance Right    Next MD Visit in May    Prior Therapy No      Precautions   Precautions Shoulder    Type of Shoulder Precautions L total shoulder    Precaution Comments per [facility] protocol: avoid hyperextension in supine, no sudden jerking motions, no lifting anything heavier than a coffee cup, no supporting of body weight of hand on involved side      Restrictions   Weight Bearing Restrictions No   not given     Balance Screen   Has the patient fallen in the past 6 months Yes    How many times? 3    Has the patient had a decrease in activity level because of a fear of falling?  No   not specified   Is the patient reluctant to leave their home because of a fear of falling?  No   not specified     [place] residence    Living Arrangements Spouse/significant other    Type of [place] to  enter    Entrance Stairs-Number of Steps --   not specified   Home Layout Two level    Alternate Level Stairs-Number of Steps flight      Prior Function   Level of Independence Independent    Vocation On disability;Other (comment)   per chart review   Vocation Requirements per chart review: 'deafness, seizures, and carpal tunnel'      Cognition   Overall Cognitive Status Difficult to assess    Difficult to assess due to Hard of hearing/deaf      Observation/Other Assessments   Observations surgical scar healing normally    Focus on Therapeutic Outcomes (FOTO)  No FOTO set up      Sensation   Light Touch Appears Intact      Coordination   Gross Motor Movements are Fluid and Coordinated No    Coordination and Movement Description slow hesistant movement with R UE      Posture/Postural Control   Posture/Postural Control Postural limitations    Postural Limitations Rounded Shoulders;Forward head      ROM / Strength   AROM / PROM / Strength PROM      PROM   PROM Assessment Site Shoulder;Elbow    Right/Left Shoulder Right    Right Shoulder Flexion 75 Degrees    Right Shoulder Internal Rotation --   to stomach at30 degrees abduction   Right Shoulder External Rotation 45 Degrees   at 30 degrees abduction   Right/Left Elbow Right    Right Elbow Flexion 129      Bed Mobility   Bed Mobility Sit to Supine;Supine to Sit    Supine to Sit Moderate Assistance - Patient 50-74%   pt states this is her baseline and due to bilateral total hips   Sit to Supine Maximal Assistance - Patient 25-49%   handheld assist with L UE                               Objective measurements completed on examination: See above findings.          [facility] Adult PT Treatment/Exercise - 08/26/20 0001       Exercises   Exercises Shoulder      Shoulder Exercises: Supine   External Rotation AAROM;Right;10 reps    External Rotation Limitations with  dowel in scaption; very slow hesistant  movement    Flexion AAROM;Both;10 reps    Flexion Limitations with dowel; very slow hesistant movement      Shoulder Exercises: Seated   Flexion Right    Flexion Limitations table slides x10    Other Seated Exercises R bicep curls x10   no weight; very slow hesistant movement                         PT Education - 08/26/20 1342     Education Details HEP, sx explanation, POC, shoulder precautions    Person(s) Educated Patient    Methods Explanation;Demonstration;Tactile cues;Verbal cues;Handout    Comprehension Verbalized understanding;Returned demonstration;Tactile cues required;Verbal cues required;Need further instruction              PT Short Term Goals - 08/26/20 1421       PT SHORT TERM GOAL #1   Title Pt will be independent with intial HEP    Baseline given at initial eval    Time 2    Period Weeks    Status New    Target Date 09/09/20      PT SHORT TERM GOAL #2   Title Pt will increase flexion in the scapular plane to 90 in order to improve overhead activity    Baseline 75 degrees    Time 3    Period Weeks    Status New    Target Date 09/16/20      PT SHORT TERM GOAL #3   Title Pt will show a decrease in R shoulder pain at rest to </= 3/10 in order to help with sleep.    Baseline 5/10 pain at rest    Time 3    Period Weeks    Status New    Target Date 09/16/20                PT Long Term Goals - 08/26/20 1424       PT LONG TERM GOAL #1   Title Pt will be indepenent with final HEP    Baseline not given    Time 8    Period Weeks    Status New    Target Date 10/21/20      PT LONG TERM GOAL #2   Title Pt will increase R shoulder strength to >/= 4/10 strength    Baseline not assessed due to precautions    Time 8    Period Weeks    Status New    Target Date 10/21/20      PT LONG TERM GOAL #3   Title Pt will achieve WNL AROM into flexion, IR, and ER in order to promote increased R UE usage, overhead mobility, and help with  dressing    Baseline flexion 75 degrees, ER 45 degrees and IR to stomach all in scapular plane and in PROM    Time 8    Period Weeks    Status New    Target Date 10/21/20      PT LONG TERM GOAL #4   Title Pt will report </= 2/10 pain with movement to help promote L UE functional mobility.    Baseline 5/10 pain at rest    Time 8    Period Weeks    Status New    Target Date 10/21/20      PT LONG TERM GOAL #5   Title Pt will be min assist with bed mobility and  be able to use her R UE to help her with this to increase independence    Baseline mod-max assist from supine to/from sit and NWB on R UE    Time 8    Period Weeks    Status New    Target Date 10/21/20                         Plan - 08/26/20 1357     Clinical Impression Statement Pt is a 65 y/o F presenting to OPPT s/p R total shoulder replacement and is 6 weeks out since surgery. Examination reveals 75 degrees of flexion, 45 degrees of ER and IR to stomach at 30 degrees of abduction. Per protocol, needs to get to 90 degrees of flexion to progress forward in order to progress to phase II. Using [facility] protocol: https://www.brighamandwomens.org/assets/[facility]/patients-and-families/pdfs/shoulder---total-shoulder-arthroplasty-protocol.pdf. Pt also ranged from mod-max assist for supine to/form sit due to bilateral total hip replacements she had previously. Limited examination due to language barrier. Pt given exercises focusing on AAROM flexion and ER as well as bicep curls. She would benefit from skilled PT in order to address mobility and strength deficits while safely going through protocol in order to increase functional overhead activity and strength in order to return to PLOF and decrease pain.    Personal Factors and Comorbidities Comorbidity 3+;Age;Social Background;Past/Current Experience;Time since onset of injury/illness/exacerbation    Comorbidities bilateral hip replacements, deafness, pacemaker, seizures     Examination-Activity Limitations Lift;Hygiene/Grooming;Dressing;Carry;Caring for Others;Bed Mobility;Bathing;Reach Overhead    Examination-Participation Restrictions Cleaning;Community Activity;Driving;Yard Work;Volunteer;Laundry;Shop;Meal Prep    Stability/Clinical Decision Making Evolving/Moderate complexity    Clinical Decision Making Moderate    Rehab Potential Good    PT Frequency 2x / week    PT Duration 8 weeks   2x/week for 5 weeks then 1x/week for 3 weeks   PT Treatment/Interventions ADLs/Self Care Home Management;Moist Heat;Cryotherapy;Manual techniques;Passive range of motion;Dry needling;Patient/family education;Therapeutic activities;Therapeutic exercise;Ultrasound;Taping;Balance training;Stair training;Functional mobility training;Scar mobilization;Compression bandaging;Gait training;Electrical Stimulation;Neuromuscular re-education;Joint Manipulations    PT Next Visit Plan PROM manual, progress AAROM for ER and elevation as tolerated, HEP review and progression, elbow and wrist exercises, bed mobility training PRN; see assessment for link to protocol used    PT Home Exercise Plan [id number]    Consulted and Agree with Plan of Care Patient             Patient will benefit from skilled therapeutic intervention in order to improve the following deficits and impairments:  Pain,Impaired flexibility,Postural dysfunction,Decreased strength,Decreased mobility,Increased edema,Decreased balance,Decreased activity tolerance,Impaired UE functional use,Decreased range of motion,Increased fascial restricitons  Visit Diagnosis: Right shoulder pain, unspecified chronicity  Stiffness of right shoulder, not elsewhere classified  Muscle weakness (generalized)      Problem List Patient Active Problem List   Diagnosis Date Noted   Squamous cell carcinoma, 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   Hx of adenomatous colonic polyps 11/19/2016   Localization-related idiopathic epilepsy and epileptic syndromes  with seizures of localized onset, not intractable, without status epilepticus (HCC) 01/01/2015   Drop attack 01/01/2015   Dizziness and giddiness 01/01/2015   Partial epilepsy with impairment of consciousness (HCC) 09/12/2013   Pacemaker-dual chamber medtronic 10/25/2012   Deaf 08/26/2012   Migraine 08/26/2012   Sinus node dysfunction (HCC) 08/26/2012   H/O bilateral hip replacements 08/26/2012    Jeri CosCarly Jisell Majer, SPT 08/26/2020, 2:59 PM  [facility] [address] [place], [place], [address] Phone: [phone]   Fax:  [phone]  Name: Mattelyn Imhoff MRN: [id number] Date of Birth: Jan 01, 1956

## 2020-08-26 NOTE — Patient Instructions (Signed)
Access Code: [email or web address] URL: [email or web address] Date: 08/26/2020 Prepared by: Caleb Popp  Exercises  Supine Shoulder Flexion Extension AAROM with Dowel - 1 x daily - 7 x weekly - 2 sets - 10 reps Supine Shoulder External Rotation AAROM with Dowel - 1 x daily - 7 x weekly - 2 sets - 10 reps Standing Single Arm Shoulder Flexion Towel Slide at Table Top - 1 x daily - 7 x weekly - 2 sets - 10 reps Standing Bicep Curl with Dumbbells - 1 x daily - 7 x weekly - 2 sets - 10 reps

## 2020-09-01 ENCOUNTER — Other Ambulatory Visit: Payer: Self-pay | Admitting: Family Medicine

## 2020-09-01 DIAGNOSIS — E559 Vitamin D deficiency, unspecified: Secondary | ICD-10-CM

## 2020-09-01 DIAGNOSIS — E538 Deficiency of other specified B group vitamins: Secondary | ICD-10-CM

## 2020-09-01 DIAGNOSIS — R7989 Other specified abnormal findings of blood chemistry: Secondary | ICD-10-CM

## 2020-09-01 DIAGNOSIS — R739 Hyperglycemia, unspecified: Secondary | ICD-10-CM

## 2020-09-01 DIAGNOSIS — I10 Essential (primary) hypertension: Secondary | ICD-10-CM

## 2020-09-04 ENCOUNTER — Encounter: Payer: Self-pay | Admitting: Rehabilitative and Restorative Service Providers"

## 2020-09-04 ENCOUNTER — Other Ambulatory Visit: Payer: Self-pay

## 2020-09-04 ENCOUNTER — Ambulatory Visit: Payer: Medicare Other | Attending: Orthopedic Surgery | Admitting: Rehabilitative and Restorative Service Providers"

## 2020-09-04 DIAGNOSIS — M6281 Muscle weakness (generalized): Secondary | ICD-10-CM | POA: Insufficient documentation

## 2020-09-04 DIAGNOSIS — M25611 Stiffness of right shoulder, not elsewhere classified: Secondary | ICD-10-CM | POA: Diagnosis not present

## 2020-09-04 DIAGNOSIS — M25511 Pain in right shoulder: Secondary | ICD-10-CM | POA: Insufficient documentation

## 2020-09-04 NOTE — Therapy (Signed)
[place], [place], [address] Phone: [phone]   Fax:  [phone]  Physical Therapy Treatment  Patient Details  Name: [name] [name] MRN: [id number] Date of Birth: [birth year] Referring Provider (PT): Tania Ade, MD   Encounter Date: 09/04/2020   PT End of Session - 09/04/20 1008    Visit Number 2    Number of Visits 13    Date for PT Re-Evaluation 10/21/20    Authorization Type [facility] MCR/MCD    Progress Note Due on Visit 10    PT Start Time 0906    PT Stop Time 0955    PT Time Calculation (min) 49 min    Activity Tolerance Patient tolerated treatment well;No increased pain    Behavior During Therapy WFL for tasks assessed/performed           Past Medical History:  Diagnosis Date  . Chicken pox   . Conductive hearing loss, childhood onset   . 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 Epilepsy, followed by Dr. Tomma Rakers in neurology 08/26/2012  . Hx of adenomatous colonic polyps 11/19/2016  . Pacemaker   . Pacemaker    2012  . Seizure ([place])   . Seizures ([place])    Petite Mal     Past Surgical History:  Procedure Laterality Date  . HIP ARTHROPLASTY    . PACEMAKER INSERTION  07/2010  . TOTAL HIP ARTHROPLASTY  07/2011   right  . TOTAL HIP ARTHROPLASTY  03/2012   left  . TOTAL SHOULDER ARTHROPLASTY Right 07/15/2020   Procedure: TOTAL SHOULDER ARTHROPLASTY;  Surgeon: Tania Ade, MD;  Location: [facility];  Service: Orthopedics;  Laterality: Right;  NEED 120 MINUTES    There were no vitals filed for this visit.   Subjective Assessment - 09/04/20 0910    Subjective both of my shoulders hurt. pt reports that both shoulders were hurting since a fall over a month ago but that exercises for R shoulder may aggravate the left. ER with dowel causes pain on the left per pt report. Pt very confused on pain scale; interpreter assisted in various explanations for left shoulder. pt also confused on  PT asking pain level right now vs with exercise.    Currently in Pain? No/denies    Pain Score 0-No pain    Pain Location Shoulder    Pain Orientation Right    Multiple Pain Sites Yes    Pain Score 7    Pain Location Shoulder    Pain Orientation Left    Pain Descriptors / Indicators Other (Comment)   pulsing   Pain Type Acute pain    Pain Radiating Towards localized around anterior shoulder; pain with dowel IR L    Pain Onset In the past 7 days    Pain Frequency Intermittent    Aggravating Factors  doing exercises for R shoulder                             [facility] Adult PT Treatment/Exercise - 09/04/20 0001      Shoulder Exercises: Supine   Other Supine Exercises dowel bil shoulder flex/ER with PT max verbal, visual and tactile cues for technique with assist of interpreter      Shoulder Exercises: Seated   Other Seated Exercises flexion table slides performed using towel, using towel on pillow case, using ball with PT max verbal, visual and tactile cues  for technique repeatedly with interpreter assistance. Pt did better wall ball without substitution present and is to see if family has 55.5 cm ball and if not to purchase one.      Shoulder Exercises: Standing   Other Standing Exercises 1 lb R bicep curl x 15                  PT Education - 09/04/20 1005    Education Details Pt found to be doing all exercises incorrectly and required max PT verbal, tactile and visual cues for all with assist of interpreter. PT also discussed to pt that she is not to put pressure on the R shoulder as she was noted to do during session with transfers and getting confused in between prone and supine positioning and with overall bed mobility. Pt stated understanding on all. Pt to find a 55.5 cm ball to assist with R shoulder flexion table slides. PT advised her if she can't or doesn't want to find a ball to discontinue that HEP. Pt also initially stated she is using a 1 lb weight at  home for R bicep curls; however, when PT gave her a 1 lb weight, she was surprised at its weight and stated she used whatever her Mom gave her which "could be a 5 lb weight.Marland KitchenMarland KitchenI don't know." PT advised her to just use a 6 oz or less soup can since the weight is unknown on her weight. PT explained that she is in the stages of healing and is at a higher risk of reinjury at this time and the activities she is currently doing can mess up her surgery. She agreed.    Person(s) Educated Patient    Methods Explanation;Demonstration;Tactile cues;Verbal cues;Handout    Comprehension Verbalized understanding;Returned demonstration;Need further instruction;Verbal cues required;Tactile cues required            PT Short Term Goals - 09/04/20 1015      PT SHORT TERM GOAL #1   Title Pt will be independent with intial HEP    Status On-going      PT SHORT TERM GOAL #2   Title Pt will increase flexion in the scapular plane to 90 in order to improve overhead activity    Status On-going      PT SHORT TERM GOAL #3   Title Pt will show a decrease in R shoulder pain at rest to </= 3/10 in order to help with sleep.    Status On-going             PT Long Term Goals - 08/26/20 1424      PT LONG TERM GOAL #1   Title Pt will be indepenent with final HEP    Baseline not given    Time 8    Period Weeks    Status New    Target Date 10/21/20      PT LONG TERM GOAL #2   Title Pt will increase R shoulder strength to >/= 4/10 strength    Baseline not assessed due to precautions    Time 8    Period Weeks    Status New    Target Date 10/21/20      PT LONG TERM GOAL #3   Title Pt will achieve WNL AROM into flexion, IR, and ER in order to promote increased R UE usage, overhead mobility, and help with dressing    Baseline flexion 75 degrees, ER 45 degrees and IR to stomach all in scapular plane and  in PROM    Time 8    Period Weeks    Status New    Target Date 10/21/20      PT LONG TERM GOAL #4   Title  Pt will report </= 2/10 pain with movement to help promote L UE functional mobility.    Baseline 5/10 pain at rest    Time 8    Period Weeks    Status New    Target Date 10/21/20      PT LONG TERM GOAL #5   Title Pt will be min assist with bed mobility and be able to use her R UE to help her with this to increase independence    Baseline mod-max assist from supine to/from sit and NWB on R UE    Time 8    Period Weeks    Status New    Target Date 10/21/20                 Plan - 09/04/20 1009    Clinical Impression Statement Pt is a 65 y/o F presenting to OPPT s/p R total shoulder replacement and is 7 weeks out since surgery. Per protocol, needs to get to 90 degrees of flexion to progress forward in order to progress to phase II. Using [employer] [facility] protocol: https://www.brighamandwomens.org/assets/[facility]/patients-and-families/pdfs/shoulder---total-shoulder-arthroplasty-protocol.pdf. Pt also ranged from mod-max assist for supine to/form sit due to bilateral total hip replacements she had previously. She would benefit from skilled PT in order to address mobility and strength deficits while safely going through protocol in order to increase functional overhead activity and strength in order to return to PLOF and decrease pain. Pt with confusion with HEP and correct technique; treatment was spent reviewing HEP to work on proper form and modifications with assistance of interpreter. HEP review will again need to be performed at next visit (see pt education section).    Rehab Potential Good    PT Frequency 2x / week    PT Duration 8 weeks    PT Treatment/Interventions ADLs/Self Care Home Management;Moist Heat;Cryotherapy;Manual techniques;Passive range of motion;Dry needling;Patient/family education;Therapeutic activities;Therapeutic exercise;Ultrasound;Taping;Balance training;Stair training;Functional mobility training;Scar mobilization;Compression bandaging;Gait training;Electrical  Stimulation;Neuromuscular re-education;Joint Manipulations    PT Next Visit Plan REVIEW HEP AGAIN (SEE PT EDUCATION); PROM manual, progress AAROM for ER and elevation as tolerated,  elbow and wrist exercises, bed mobility training PRN; see assessment for link to protocol used    Consulted and Agree with Plan of Care Patient;Other (Comment)   INTERPRETER PRESENT          Patient will benefit from skilled therapeutic intervention in order to improve the following deficits and impairments:  Pain,Impaired flexibility,Postural dysfunction,Decreased strength,Decreased mobility,Increased edema,Decreased balance,Decreased activity tolerance,Impaired UE functional use,Decreased range of motion,Increased fascial restricitons  Visit Diagnosis: Right shoulder pain, unspecified chronicity  Stiffness of right shoulder, not elsewhere classified  Muscle weakness (generalized)     Problem List Patient Active Problem List   Diagnosis Date Noted  . 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 Hx of adenomatous colonic polyps 11/19/2016  . Localization-related idiopathic epilepsy and epileptic syndromes with seizures of localized onset, not intractable, without status epilepticus ([place]) 01/01/2015  . 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 Dizziness and giddiness 01/01/2015  . Partial epilepsy with impairment of consciousness ([place]) 09/12/2013  . 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 Deaf 08/26/2012  . Migraine 08/26/2012  . Sinus node dysfunction ([place]) 08/26/2012  . H/O bilateral hip replacements 08/26/2012    America Brown, PT 09/04/2020, 10:16 AM  [facility] [address] [place], [place], [address] Phone: [phone]   Fax:  [phone]  Name: [name] [name] MRN: [id number] Date of Birth: Jun 10, 1955

## 2020-09-07 ENCOUNTER — Telehealth: Payer: Self-pay | Admitting: Family Medicine

## 2020-09-07 NOTE — Telephone Encounter (Signed)
Left message for patient to call back and schedule Medicare Annual Wellness Visit (AWV) either virtually or in office.   Last AWV 08/30/17  please schedule at anytime with [facility] Nurse Health Advisor 1 or 2   This should be a 45 minute visit.  

## 2020-09-09 ENCOUNTER — Ambulatory Visit: Payer: Medicare Other | Admitting: Physical Therapy

## 2020-09-09 ENCOUNTER — Other Ambulatory Visit: Payer: Self-pay

## 2020-09-09 DIAGNOSIS — M6281 Muscle weakness (generalized): Secondary | ICD-10-CM | POA: Diagnosis not present

## 2020-09-09 DIAGNOSIS — M25611 Stiffness of right shoulder, not elsewhere classified: Secondary | ICD-10-CM

## 2020-09-09 DIAGNOSIS — M25511 Pain in right shoulder: Secondary | ICD-10-CM

## 2020-09-09 NOTE — Therapy (Signed)
[place], [place], [address] Phone: [phone]   Fax:  [phone]  Physical Therapy Treatment  Patient Details  Name: [name] [name] MRN: [id number] Date of Birth: August 27, 1955 Referring Provider (PT): Tania Ade, MD   Encounter Date: 09/09/2020   PT End of Session - 09/09/20 1324    Visit Number 3    Number of Visits 13    Date for PT Re-Evaluation 10/21/20    Authorization Type UHC MCR/MCD    Progress Note Due on Visit 10    PT Start Time 1330    PT Stop Time 1415    PT Time Calculation (min) 45 min    Activity Tolerance Patient tolerated treatment well;No increased pain    Behavior During Therapy WFL for tasks assessed/performed           Past Medical History:  Diagnosis Date  . Chicken pox   . Conductive hearing loss, childhood onset   . 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 Epilepsy, followed by Dr. Tomma Rakers in neurology 08/26/2012  . Hx of adenomatous colonic polyps 11/19/2016  . Pacemaker   . Pacemaker    2012  . Seizure ([place])   . Seizures ([place])    Petite Mal     Past Surgical History:  Procedure Laterality Date  . HIP ARTHROPLASTY    . PACEMAKER INSERTION  07/2010  . TOTAL HIP ARTHROPLASTY  07/2011   right  . TOTAL HIP ARTHROPLASTY  03/2012   left  . TOTAL SHOULDER ARTHROPLASTY Right 07/15/2020   Procedure: TOTAL SHOULDER ARTHROPLASTY;  Surgeon: Tania Ade, MD;  Location: [facility];  Service: Orthopedics;  Laterality: Right;  NEED 120 MINUTES    There were no vitals filed for this visit.   Subjective Assessment - 09/09/20 1332    Subjective In person interpreter not available. Utilized tablet. Pt states exercises have been going okay. Pt is now almost 8 wks post-op.    Patient is accompained by: Interpreter   [employer] for ASL   Pertinent History bilateral hip replacements, deafness, pacemaker, seizures    Limitations Lifting;House hold  activities    Patient Stated Goals pain to be resolved, pick up heavy things to 25#, get object out of cabinet with right arm    Currently in Pain? Yes    Pain Score 3     Pain Location Shoulder    Pain Orientation Right;Upper;Mid    Pain Onset In the past 7 days              [facility] PT Assessment - 09/09/20 0001      PROM   Right Shoulder Flexion 100 Degrees    Right Shoulder ABduction 95 Degrees    Right Shoulder Internal Rotation --   to belly   Right Shoulder External Rotation 60 Degrees                         [facility] Adult PT Treatment/Exercise - 09/09/20 0001      Shoulder Exercises: Supine   External Rotation AROM;Strengthening;Right;10 reps    Flexion AAROM;Both;10 reps;AROM   x10 AROM     Shoulder Exercises: Seated   Retraction Strengthening;Both;10 reps   3 sec hold     Shoulder Exercises: Standing   Flexion Strengthening;Right;10 reps   3 sec hold iso   ABduction Strengthening;Right;10 reps   3 sec hold iso   Extension Strengthening;Right;10  reps   3 sec hold iso     Manual Therapy   Manual Therapy Passive ROM;Joint mobilization    Joint Mobilization Grade II caudal GH mob in slight abduction    Passive ROM Shoulder flex, abd, and ER                  PT Education - 09/09/20 1417    Education Details Discussed HEP updates and continued NWB through R UE. Discussed icing now that initiating gentle strengthening. Reinforced that exercises should be pain free.    Person(s) Educated Patient    Methods Explanation;Demonstration;Tactile cues;Handout    Comprehension Verbalized understanding;Returned demonstration;Tactile cues required;Need further instruction            PT Short Term Goals - 09/09/20 1516      PT SHORT TERM GOAL #1   Title Pt will be independent with intial HEP    Status Achieved      PT SHORT TERM GOAL #2   Title Pt will increase flexion in the scapular plane to 90 in order to improve overhead activity    Baseline  100 deg on 09/09/20    Status Achieved      PT SHORT TERM GOAL #3   Title Pt will show a decrease in R shoulder pain at rest to </= 3/10 in order to help with sleep.    Baseline 3/10 at rest on 09/09/20    Status Achieved             PT Long Term Goals - 08/26/20 1424      PT LONG TERM GOAL #1   Title Pt will be indepenent with final HEP    Baseline not given    Time 8    Period Weeks    Status New    Target Date 10/21/20      PT LONG TERM GOAL #2   Title Pt will increase R shoulder strength to >/= 4/10 strength    Baseline not assessed due to precautions    Time 8    Period Weeks    Status New    Target Date 10/21/20      PT LONG TERM GOAL #3   Title Pt will achieve WNL AROM into flexion, IR, and ER in order to promote increased R UE usage, overhead mobility, and help with dressing    Baseline flexion 75 degrees, ER 45 degrees and IR to stomach all in scapular plane and in PROM    Time 8    Period Weeks    Status New    Target Date 10/21/20      PT LONG TERM GOAL #4   Title Pt will report </= 2/10 pain with movement to help promote L UE functional mobility.    Baseline 5/10 pain at rest    Time 8    Period Weeks    Status New    Target Date 10/21/20      PT LONG TERM GOAL #5   Title Pt will be min assist with bed mobility and be able to use her R UE to help her with this to increase independence    Baseline mod-max assist from supine to/from sit and NWB on R UE    Time 8    Period Weeks    Status New    Target Date 10/21/20                 Plan - 09/09/20 1511  Clinical Impression Statement Pt is near 8 weeks post-op and is meeting her STGs. Pt with improving shoulder ROM. Pt able to demo >90 deg flexion; progressed her accordingly per [facility] protocol. Initiated gentle isometrics -- reinforced exercise is to remain pain free. Continued PROM and grade II inferior joint mobilizations for abd (this is her most limiting motion). [employer]  interpreter cut off mid way -- able to type to patient for communication.    Personal Factors and Comorbidities Comorbidity 3+;Age;Social Background;Past/Current Experience;Time since onset of injury/illness/exacerbation    Comorbidities bilateral hip replacements, deafness, pacemaker, seizures    Examination-Activity Limitations Lift;Hygiene/Grooming;Dressing;Carry;Caring for Others;Bed Mobility;Bathing;Reach Overhead    Examination-Participation Restrictions Cleaning;Community Activity;Driving;Yard Work;Volunteer;Laundry;Shop;Meal Prep    Stability/Clinical Decision Making Evolving/Moderate complexity    Rehab Potential Good    PT Frequency 2x / week    PT Duration 8 weeks    PT Treatment/Interventions ADLs/Self Care Home Management;Moist Heat;Cryotherapy;Manual techniques;Passive range of motion;Dry needling;Patient/family education;Therapeutic activities;Therapeutic exercise;Ultrasound;Taping;Balance training;Stair training;Functional mobility training;Scar mobilization;Compression bandaging;Gait training;Electrical Stimulation;Neuromuscular re-education;Joint Manipulations    PT Next Visit Plan Review HEP and isometrics. PROM manual, progress ROM as tolerated (most limited with shoulder abduction).  Elbow and wrist exercises, bed mobility training PRN.    PT Home Exercise Plan [place] and Agree with Plan of Care Patient           Patient will benefit from skilled therapeutic intervention in order to improve the following deficits and impairments:  Pain,Impaired flexibility,Postural dysfunction,Decreased strength,Decreased mobility,Increased edema,Decreased balance,Decreased activity tolerance,Impaired UE functional use,Decreased range of motion,Increased fascial restricitons  Visit Diagnosis: Right shoulder pain, unspecified chronicity  Stiffness of right shoulder, not elsewhere classified  Muscle weakness (generalized)     Problem List Patient Active Problem List    Diagnosis Date Noted  . 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 Hx of adenomatous colonic polyps 11/19/2016  . Localization-related idiopathic epilepsy and epileptic syndromes with seizures of localized onset, not intractable, without status epilepticus ([place]) 01/01/2015  . 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 Dizziness and giddiness 01/01/2015  . Partial epilepsy with impairment of consciousness ([place]) 09/12/2013  . 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 Deaf 08/26/2012  . Migraine 08/26/2012  . Sinus node dysfunction ([place]) 08/26/2012  . H/O bilateral hip replacements 08/26/2012    [facility] April Ma L [place] PT, DPT 09/09/2020, 3:20 PM  [facility] [address] [place], [place], [address] Phone: [phone]   Fax:  [phone]  Name: [name] [name] MRN: [id number] Date of Birth: Sep 03, 1955

## 2020-09-10 ENCOUNTER — Ambulatory Visit: Payer: Medicare Other

## 2020-09-10 DIAGNOSIS — M6281 Muscle weakness (generalized): Secondary | ICD-10-CM

## 2020-09-10 DIAGNOSIS — M25611 Stiffness of right shoulder, not elsewhere classified: Secondary | ICD-10-CM

## 2020-09-10 DIAGNOSIS — M25511 Pain in right shoulder: Secondary | ICD-10-CM

## 2020-09-10 NOTE — Therapy (Signed)
[place], [place], [address] Phone: [phone]   Fax:  [phone]  Physical Therapy Treatment  Patient Details  Name: [name] [name] MRN: [id number] Date of Birth: 1955/10/09 Referring Provider (PT): Tania Ade, MD   Encounter Date: 09/10/2020   PT End of Session - 09/10/20 1537    Visit Number 4    Number of Visits 13    Date for PT Re-Evaluation 10/21/20    Authorization Type UHC MCR/MCD    Progress Note Due on Visit 10    PT Start Time 1324    PT Stop Time 1438    PT Time Calculation (min) 45 min    Activity Tolerance Patient tolerated treatment well;No increased pain    Behavior During Therapy WFL for tasks assessed/performed           Past Medical History:  Diagnosis Date  . Chicken pox   . Conductive hearing loss, childhood onset   . 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 Epilepsy, followed by Dr. Tomma Rakers in neurology 08/26/2012  . Hx of adenomatous colonic polyps 11/19/2016  . Pacemaker   . Pacemaker    2012  . Seizure ([place])   . Seizures ([place])    Petite Mal     Past Surgical History:  Procedure Laterality Date  . HIP ARTHROPLASTY    . PACEMAKER INSERTION  07/2010  . TOTAL HIP ARTHROPLASTY  07/2011   right  . TOTAL HIP ARTHROPLASTY  03/2012   left  . TOTAL SHOULDER ARTHROPLASTY Right 07/15/2020   Procedure: TOTAL SHOULDER ARTHROPLASTY;  Surgeon: Tania Ade, MD;  Location: [facility];  Service: Orthopedics;  Laterality: Right;  NEED 120 MINUTES    There were no vitals filed for this visit.   Subjective Assessment - 09/10/20 1358    Subjective Pt reports shoulder is feeling a little better, thinks it is improving. Didn't do any exercises yesterday because she was here.    Patient is accompained by: Interpreter   [name]   Pertinent History bilateral hip replacements, deafness, pacemaker, seizures    Limitations Lifting;House hold activities    Patient  Stated Goals pain to be resolved, pick up heavy things to 25#, get object out of cabinet with right arm    Currently in Pain? Yes    Pain Score 1     Pain Location Shoulder    Pain Orientation Right;Upper;Mid    Pain Descriptors / Indicators Sore    Pain Onset In the past 7 days                             [facility] Adult PT Treatment/Exercise - 09/10/20 0001      Self-Care   Self-Care Other Self-Care Comments    Other Self-Care Comments  see pt edu      Shoulder Exercises: Supine   External Rotation AROM;Strengthening;Right;10 reps    Flexion AAROM;Both;10 reps;AROM   x10 AROM     Shoulder Exercises: Seated   Retraction Strengthening;Both;10 reps   3 sec hold   Retraction Limitations hands on cervical retraction/distraction for cervical posturing, as well as scapular retraction and depression and reminders for UT relaxation      Shoulder Exercises: Standing   Flexion Strengthening;Both   20" hold   Flexion Limitations at wall to maintain upper back and cervical posture    ABduction Strengthening;Both   20"  ABduction Limitations at wall to maintain upper back and cervical posture   scaption                 PT Education - 09/10/20 1536    Education Details Posture at home, relaxing R UT    Person(s) Educated Patient    Methods Explanation;Demonstration;Tactile cues;Verbal cues    Comprehension Verbalized understanding;Returned demonstration;Verbal cues required;Tactile cues required            PT Short Term Goals - 09/09/20 1516      PT SHORT TERM GOAL #1   Title Pt will be independent with intial HEP    Status Achieved      PT SHORT TERM GOAL #2   Title Pt will increase flexion in the scapular plane to 90 in order to improve overhead activity    Baseline 100 deg on 09/09/20    Status Achieved      PT SHORT TERM GOAL #3   Title Pt will show a decrease in R shoulder pain at rest to </= 3/10 in order to help with sleep.    Baseline 3/10 at  rest on 09/09/20    Status Achieved             PT Long Term Goals - 08/26/20 1424      PT LONG TERM GOAL #1   Title Pt will be indepenent with final HEP    Baseline not given    Time 8    Period Weeks    Status New    Target Date 10/21/20      PT LONG TERM GOAL #2   Title Pt will increase R shoulder strength to >/= 4/10 strength    Baseline not assessed due to precautions    Time 8    Period Weeks    Status New    Target Date 10/21/20      PT LONG TERM GOAL #3   Title Pt will achieve WNL AROM into flexion, IR, and ER in order to promote increased R UE usage, overhead mobility, and help with dressing    Baseline flexion 75 degrees, ER 45 degrees and IR to stomach all in scapular plane and in PROM    Time 8    Period Weeks    Status New    Target Date 10/21/20      PT LONG TERM GOAL #4   Title Pt will report </= 2/10 pain with movement to help promote L UE functional mobility.    Baseline 5/10 pain at rest    Time 8    Period Weeks    Status New    Target Date 10/21/20      PT LONG TERM GOAL #5   Title Pt will be min assist with bed mobility and be able to use her R UE to help her with this to increase independence    Baseline mod-max assist from supine to/from sit and NWB on R UE    Time 8    Period Weeks    Status New    Target Date 10/21/20                 Plan - 09/10/20 1537    Clinical Impression Statement Pt presents with in person interpeter today. She tolerated treatment well, but is sensitve to more than gentle pressure to UT and pecs for soft tissue/myofascial release. Pt reported decreased pain in R anterior shoulder with improved posture, while performing chin tucks and  scapular depression and retraction. Pt able to perform AROM flexion and scaption holds below 90 against wall with tactile and visual cue to maintain scapulae and occiput to wall for improved posture. Pt denied ice at end of session, stating she would apply it home.    Personal  Factors and Comorbidities Comorbidity 3+;Age;Social Background;Past/Current Experience;Time since onset of injury/illness/exacerbation    Comorbidities bilateral hip replacements, deafness, pacemaker, seizures    Examination-Activity Limitations Lift;Hygiene/Grooming;Dressing;Carry;Caring for Others;Bed Mobility;Bathing;Reach Overhead    Examination-Participation Restrictions Cleaning;Community Activity;Driving;Yard Work;Volunteer;Laundry;Shop;Meal Prep    Stability/Clinical Decision Making Evolving/Moderate complexity    Rehab Potential Good    PT Frequency 2x / week    PT Duration 8 weeks    PT Treatment/Interventions ADLs/Self Care Home Management;Moist Heat;Cryotherapy;Manual techniques;Passive range of motion;Dry needling;Patient/family education;Therapeutic activities;Therapeutic exercise;Ultrasound;Taping;Balance training;Stair training;Functional mobility training;Scar mobilization;Compression bandaging;Gait training;Electrical Stimulation;Neuromuscular re-education;Joint Manipulations    PT Next Visit Plan Review HEP and isometrics. PROM manual, progress ROM as tolerated (most limited with shoulder abduction).  Elbow and wrist exercises, bed mobility training PRN.    PT Home Exercise Plan [place] and Agree with Plan of Care Patient           Patient will benefit from skilled therapeutic intervention in order to improve the following deficits and impairments:  Pain,Impaired flexibility,Postural dysfunction,Decreased strength,Decreased mobility,Increased edema,Decreased balance,Decreased activity tolerance,Impaired UE functional use,Decreased range of motion,Increased fascial restricitons  Visit Diagnosis: Right shoulder pain, unspecified chronicity  Stiffness of right shoulder, not elsewhere classified  Muscle weakness (generalized)     Problem List Patient Active Problem List   Diagnosis Date Noted  . 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 Hx of  adenomatous colonic polyps 11/19/2016  . Localization-related idiopathic epilepsy and epileptic syndromes with seizures of localized onset, not intractable, without status epilepticus ([place]) 01/01/2015  . 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 Dizziness and giddiness 01/01/2015  . Partial epilepsy with impairment of consciousness ([place]) 09/12/2013  . 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 Deaf 08/26/2012  . Migraine 08/26/2012  . Sinus node dysfunction ([place]) 08/26/2012  . H/O bilateral hip replacements 08/26/2012    Izell [place], PT, DPT 09/10/2020, 3:44 PM  [facility] [address] [place], [place], [address] Phone: [phone]   Fax:  [phone]  Name: [name] [name] MRN: [id number] Date of Birth: 06-Jun-1955

## 2020-09-13 ENCOUNTER — Encounter: Payer: Self-pay | Admitting: Physical Therapy

## 2020-09-13 ENCOUNTER — Other Ambulatory Visit: Payer: Self-pay

## 2020-09-13 ENCOUNTER — Ambulatory Visit: Payer: Medicare Other | Admitting: Physical Therapy

## 2020-09-13 DIAGNOSIS — M25611 Stiffness of right shoulder, not elsewhere classified: Secondary | ICD-10-CM

## 2020-09-13 DIAGNOSIS — M25511 Pain in right shoulder: Secondary | ICD-10-CM

## 2020-09-13 DIAGNOSIS — M6281 Muscle weakness (generalized): Secondary | ICD-10-CM | POA: Diagnosis not present

## 2020-09-13 NOTE — Progress Notes (Signed)
Remote pacemaker transmission.   

## 2020-09-13 NOTE — Therapy (Signed)
[place], [place], [address] Phone: [phone]   Fax:  [phone]  Physical Therapy Treatment  Patient Details  Name: [name] [name] MRN: [id number] Date of Birth: 02/24/1956 Referring Provider (PT): Tania Ade, MD   Encounter Date: 09/13/2020   PT End of Session - 09/13/20 1203    Visit Number 5    Number of Visits 13    Date for PT Re-Evaluation 10/21/20    Authorization Type UHC MCR/MCD    Progress Note Due on Visit 10    PT Start Time 1100    PT Stop Time 1145    PT Time Calculation (min) 45 min    Activity Tolerance Patient tolerated treatment well;No increased pain    Behavior During Therapy WFL for tasks assessed/performed           Past Medical History:  Diagnosis Date  . Chicken pox   . Conductive hearing loss, childhood onset   . 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 Epilepsy, followed by Dr. Tomma Rakers in neurology 08/26/2012  . Hx of adenomatous colonic polyps 11/19/2016  . Pacemaker   . Pacemaker    2012  . Seizure ([place])   . Seizures ([place])    Petite Mal     Past Surgical History:  Procedure Laterality Date  . HIP ARTHROPLASTY    . PACEMAKER INSERTION  07/2010  . TOTAL HIP ARTHROPLASTY  07/2011   right  . TOTAL HIP ARTHROPLASTY  03/2012   left  . TOTAL SHOULDER ARTHROPLASTY Right 07/15/2020   Procedure: TOTAL SHOULDER ARTHROPLASTY;  Surgeon: Tania Ade, MD;  Location: [facility];  Service: Orthopedics;  Laterality: Right;  NEED 120 MINUTES    There were no vitals filed for this visit.   Subjective Assessment - 09/13/20 1111    Subjective Pt reports that exercises are doing okay. No pain noted. Some pain noted    Patient is accompained by: Interpreter   [name]   Pertinent History bilateral hip replacements, deafness, pacemaker, seizures    Limitations Lifting;House hold activities    Patient Stated Goals pain to be resolved, pick up heavy things to  25#, get object out of cabinet with right arm    Currently in Pain? No/denies    Pain Onset In the past 7 days                             [facility] Adult PT Treatment/Exercise - 09/13/20 0001      Shoulder Exercises: Supine   Flexion AAROM;Both;10 reps;AROM      Shoulder Exercises: Sidelying   External Rotation AROM;Right;10 reps    ABduction AROM;AAROM;Right;10 reps    Other Sidelying Exercises Retraction 2x10    Other Sidelying Exercises Short range horizontal abduction 2x10      Shoulder Exercises: Standing   Flexion Strengthening;Both    Flexion Limitations at wall to maintain upper back and cervical posture    Retraction Strengthening;Both;10 reps    Other Standing Exercises Neck retraction x10      Shoulder Exercises: Pulleys   Flexion 1 minute    Flexion Limitations cues for posture    Scaption 1 minute    Scaption Limitations cues for posture      Manual Therapy   Manual Therapy Passive ROM;Joint mobilization    Joint Mobilization Grade II caudal GH mob in slight abduction    Passive ROM  Shoulder flex, abd, and ER                    PT Short Term Goals - 09/09/20 1516      PT SHORT TERM GOAL #1   Title Pt will be independent with intial HEP    Status Achieved      PT SHORT TERM GOAL #2   Title Pt will increase flexion in the scapular plane to 90 in order to improve overhead activity    Baseline 100 deg on 09/09/20    Status Achieved      PT SHORT TERM GOAL #3   Title Pt will show a decrease in R shoulder pain at rest to </= 3/10 in order to help with sleep.    Baseline 3/10 at rest on 09/09/20    Status Achieved             PT Long Term Goals - 08/26/20 1424      PT LONG TERM GOAL #1   Title Pt will be indepenent with final HEP    Baseline not given    Time 8    Period Weeks    Status New    Target Date 10/21/20      PT LONG TERM GOAL #2   Title Pt will increase R shoulder strength to >/= 4/10 strength    Baseline not  assessed due to precautions    Time 8    Period Weeks    Status New    Target Date 10/21/20      PT LONG TERM GOAL #3   Title Pt will achieve WNL AROM into flexion, IR, and ER in order to promote increased R UE usage, overhead mobility, and help with dressing    Baseline flexion 75 degrees, ER 45 degrees and IR to stomach all in scapular plane and in PROM    Time 8    Period Weeks    Status New    Target Date 10/21/20      PT LONG TERM GOAL #4   Title Pt will report </= 2/10 pain with movement to help promote L UE functional mobility.    Baseline 5/10 pain at rest    Time 8    Period Weeks    Status New    Target Date 10/21/20      PT LONG TERM GOAL #5   Title Pt will be min assist with bed mobility and be able to use her R UE to help her with this to increase independence    Baseline mod-max assist from supine to/from sit and NWB on R UE    Time 8    Period Weeks    Status New    Target Date 10/21/20                 Plan - 09/13/20 1204    Clinical Impression Statement Requires consistent cueing to decrease upper trap overactivity with shoulder AROM. Worked on maintaining scapular retraction and depression with shoulder movement. Encouraged working on her neck and shoulder posture.    Personal Factors and Comorbidities Comorbidity 3+;Age;Social Background;Past/Current Experience;Time since onset of injury/illness/exacerbation    Comorbidities bilateral hip replacements, deafness, pacemaker, seizures    Examination-Activity Limitations Lift;Hygiene/Grooming;Dressing;Carry;Caring for Others;Bed Mobility;Bathing;Reach Overhead    Examination-Participation Restrictions Cleaning;Community Activity;Driving;Yard Work;Volunteer;Laundry;Shop;Meal Prep    Stability/Clinical Decision Making Evolving/Moderate complexity    Rehab Potential Good    PT Frequency 2x / week    PT Duration  8 weeks    PT Treatment/Interventions ADLs/Self Care Home Management;Moist  Heat;Cryotherapy;Manual techniques;Passive range of motion;Dry needling;Patient/family education;Therapeutic activities;Therapeutic exercise;Ultrasound;Taping;Balance training;Stair training;Functional mobility training;Scar mobilization;Compression bandaging;Gait training;Electrical Stimulation;Neuromuscular re-education;Joint Manipulations    PT Next Visit Plan Review HEP and isometrics. Encourage neck and shoulder posture. PROM manual, progress ROM as tolerated (most limited with shoulder abduction).    PT Home Exercise Plan [place] and Agree with Plan of Care Patient           Patient will benefit from skilled therapeutic intervention in order to improve the following deficits and impairments:  Pain,Impaired flexibility,Postural dysfunction,Decreased strength,Decreased mobility,Increased edema,Decreased balance,Decreased activity tolerance,Impaired UE functional use,Decreased range of motion,Increased fascial restricitons  Visit Diagnosis: Right shoulder pain, unspecified chronicity  Stiffness of right shoulder, not elsewhere classified  Muscle weakness (generalized)     Problem List Patient Active Problem List   Diagnosis Date Noted  . 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 Hx of adenomatous colonic polyps 11/19/2016  . Localization-related idiopathic epilepsy and epileptic syndromes with seizures of localized onset, not intractable, without status epilepticus ([place]) 01/01/2015  . 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 Dizziness and giddiness 01/01/2015  . Partial epilepsy with impairment of consciousness ([place]) 09/12/2013  . 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 Deaf 08/26/2012  . Migraine 08/26/2012  . Sinus node dysfunction ([place]) 08/26/2012  . H/O bilateral hip replacements 08/26/2012    [facility] April Ma L [place] PT, DPT 09/13/2020, 12:13 PM  [facility] [address] [place], [place], [address] Phone:  [phone]   Fax:  [phone]  Name: [name] [name] MRN: [id number] Date of Birth: Mar 16, 1956

## 2020-09-15 ENCOUNTER — Ambulatory Visit: Payer: Medicare Other | Admitting: Physical Therapy

## 2020-09-15 ENCOUNTER — Other Ambulatory Visit: Payer: Self-pay

## 2020-09-15 DIAGNOSIS — M25611 Stiffness of right shoulder, not elsewhere classified: Secondary | ICD-10-CM | POA: Diagnosis not present

## 2020-09-15 DIAGNOSIS — M6281 Muscle weakness (generalized): Secondary | ICD-10-CM | POA: Diagnosis not present

## 2020-09-15 DIAGNOSIS — M25511 Pain in right shoulder: Secondary | ICD-10-CM | POA: Diagnosis not present

## 2020-09-15 NOTE — Therapy (Signed)
[place], [place], [address] Phone: [phone]   Fax:  [phone]  Physical Therapy Treatment  Patient Details  Name: [name] [name] MRN: [id number] Date of Birth: 1956-03-17 Referring Provider (PT): Tania Ade, MD   Encounter Date: 09/15/2020   PT End of Session - 09/15/20 1159    Visit Number 6    Number of Visits 13    Date for PT Re-Evaluation 10/21/20    Authorization Type UHC MCR/MCD    Progress Note Due on Visit 10    PT Start Time 1107    PT Stop Time 1155    PT Time Calculation (min) 48 min    Activity Tolerance Patient tolerated treatment well;No increased pain    Behavior During Therapy WFL for tasks assessed/performed           Past Medical History:  Diagnosis Date  . Chicken pox   . Conductive hearing loss, childhood onset   . 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 Epilepsy, followed by Dr. Tomma Rakers in neurology 08/26/2012  . Hx of adenomatous colonic polyps 11/19/2016  . Pacemaker   . Pacemaker    2012  . Seizure ([place])   . Seizures ([place])    Petite Mal     Past Surgical History:  Procedure Laterality Date  . HIP ARTHROPLASTY    . PACEMAKER INSERTION  07/2010  . TOTAL HIP ARTHROPLASTY  07/2011   right  . TOTAL HIP ARTHROPLASTY  03/2012   left  . TOTAL SHOULDER ARTHROPLASTY Right 07/15/2020   Procedure: TOTAL SHOULDER ARTHROPLASTY;  Surgeon: Tania Ade, MD;  Location: [facility];  Service: Orthopedics;  Laterality: Right;  NEED 120 MINUTES    There were no vitals filed for this visit.   Subjective Assessment - 09/15/20 1157    Subjective Pt reports some soreness after doing exercises yesterday; otherwise, no pain noted.    Patient is accompained by: Interpreter   [name]   Pertinent History bilateral hip replacements, deafness, pacemaker, seizures    Limitations Lifting;House hold activities    Patient Stated Goals pain to be resolved, pick up heavy  things to 25#, get object out of cabinet with right arm    Currently in Pain? No/denies    Pain Onset In the past 7 days              [facility] PT Assessment - 09/15/20 0001      ROM / Strength   AROM / PROM / Strength AROM      AROM   AROM Assessment Site Shoulder    Right/Left Shoulder Right    Right Shoulder Flexion 130 Degrees    Right Shoulder ABduction 98 Degrees    Right Shoulder External Rotation 48 Degrees      PROM   Right Shoulder Flexion 138 Degrees    Right Shoulder ABduction 105 Degrees    Right Shoulder Internal Rotation 55 Degrees    Right Shoulder External Rotation 65 Degrees   with arm abducted                        [facility] Adult PT Treatment/Exercise - 09/15/20 0001      Shoulder Exercises: Supine   External Rotation AROM;Right;10 reps    Internal Rotation Strengthening;Right;10 reps    Flexion AROM;Strengthening;10 reps      Shoulder Exercises: Sidelying   ABduction AROM;Strengthening;Right;10 reps    Other  Sidelying Exercises scapular elevation, depression, retraction & protraction x10 each      Shoulder Exercises: Standing   Flexion AROM;Right;10 reps   Finger wall crawl   ABduction AROM;Strengthening;10 reps   finger wall crawl     Modalities   Modalities Vasopneumatic      Vasopneumatic   Number Minutes Vasopneumatic  10 minutes    Vasopnuematic Location  Shoulder    Vasopneumatic Pressure Medium    Vasopneumatic Temperature  44      Manual Therapy   Passive ROM Shoulder flex, abd                    PT Short Term Goals - 09/09/20 1516      PT SHORT TERM GOAL #1   Title Pt will be independent with intial HEP    Status Achieved      PT SHORT TERM GOAL #2   Title Pt will increase flexion in the scapular plane to 90 in order to improve overhead activity    Baseline 100 deg on 09/09/20    Status Achieved      PT SHORT TERM GOAL #3   Title Pt will show a decrease in R shoulder pain at rest to </= 3/10 in order  to help with sleep.    Baseline 3/10 at rest on 09/09/20    Status Achieved             PT Long Term Goals - 08/26/20 1424      PT LONG TERM GOAL #1   Title Pt will be indepenent with final HEP    Baseline not given    Time 8    Period Weeks    Status New    Target Date 10/21/20      PT LONG TERM GOAL #2   Title Pt will increase R shoulder strength to >/= 4/10 strength    Baseline not assessed due to precautions    Time 8    Period Weeks    Status New    Target Date 10/21/20      PT LONG TERM GOAL #3   Title Pt will achieve WNL AROM into flexion, IR, and ER in order to promote increased R UE usage, overhead mobility, and help with dressing    Baseline flexion 75 degrees, ER 45 degrees and IR to stomach all in scapular plane and in PROM    Time 8    Period Weeks    Status New    Target Date 10/21/20      PT LONG TERM GOAL #4   Title Pt will report </= 2/10 pain with movement to help promote L UE functional mobility.    Baseline 5/10 pain at rest    Time 8    Period Weeks    Status New    Target Date 10/21/20      PT LONG TERM GOAL #5   Title Pt will be min assist with bed mobility and be able to use her R UE to help her with this to increase independence    Baseline mod-max assist from supine to/from sit and NWB on R UE    Time 8    Period Weeks    Status New    Target Date 10/21/20                 Plan - 09/15/20 1158    Clinical Impression Statement Continued to work on shoulder AROM and strengthening in supine  and in standing. Session focused on scapular control and mobility. Vaso provided for soreness. Encouraged pt to ice at home for soreness.    Personal Factors and Comorbidities Comorbidity 3+;Age;Social Background;Past/Current Experience;Time since onset of injury/illness/exacerbation    Comorbidities bilateral hip replacements, deafness, pacemaker, seizures    Examination-Activity Limitations Lift;Hygiene/Grooming;Dressing;Carry;Caring for  Others;Bed Mobility;Bathing;Reach Overhead    Examination-Participation Restrictions Cleaning;Community Activity;Driving;Yard Work;Volunteer;Laundry;Shop;Meal Prep    Stability/Clinical Decision Making Evolving/Moderate complexity    Rehab Potential Good    PT Frequency 2x / week    PT Duration 8 weeks    PT Treatment/Interventions ADLs/Self Care Home Management;Moist Heat;Cryotherapy;Manual techniques;Passive range of motion;Dry needling;Patient/family education;Therapeutic activities;Therapeutic exercise;Ultrasound;Taping;Balance training;Stair training;Functional mobility training;Scar mobilization;Compression bandaging;Gait training;Electrical Stimulation;Neuromuscular re-education;Joint Manipulations    PT Next Visit Plan Review HEP and isometrics. Encourage neck and shoulder posture. PROM manual, progress ROM as tolerated (most limited with shoulder abduction).    PT Home Exercise Plan [place] and Agree with Plan of Care Patient           Patient will benefit from skilled therapeutic intervention in order to improve the following deficits and impairments:  Pain,Impaired flexibility,Postural dysfunction,Decreased strength,Decreased mobility,Increased edema,Decreased balance,Decreased activity tolerance,Impaired UE functional use,Decreased range of motion,Increased fascial restricitons  Visit Diagnosis: Right shoulder pain, unspecified chronicity  Stiffness of right shoulder, not elsewhere classified  Muscle weakness (generalized)     Problem List Patient Active Problem List   Diagnosis Date Noted  . 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 Hx of adenomatous colonic polyps 11/19/2016  . Localization-related idiopathic epilepsy and epileptic syndromes with seizures of localized onset, not intractable, without status epilepticus ([place]) 01/01/2015  . 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 Dizziness and giddiness 01/01/2015  . Partial epilepsy with impairment of consciousness  ([place]) 09/12/2013  . 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 Deaf 08/26/2012  . Migraine 08/26/2012  . Sinus node dysfunction ([place]) 08/26/2012  . H/O bilateral hip replacements 08/26/2012    [facility] April Ma L [place] PT, DPT 09/15/2020, 12:01 PM  [facility] [address] [place], [place], [address] Phone: [phone]   Fax:  [phone]  Name: Denika Krone MRN: [id number] Date of Birth: Aug 18, 1955

## 2020-09-22 ENCOUNTER — Other Ambulatory Visit: Payer: Self-pay

## 2020-09-22 ENCOUNTER — Ambulatory Visit: Payer: Medicare Other | Admitting: Physical Therapy

## 2020-09-22 DIAGNOSIS — M6281 Muscle weakness (generalized): Secondary | ICD-10-CM

## 2020-09-22 DIAGNOSIS — M25611 Stiffness of right shoulder, not elsewhere classified: Secondary | ICD-10-CM

## 2020-09-22 DIAGNOSIS — M25511 Pain in right shoulder: Secondary | ICD-10-CM

## 2020-09-22 NOTE — Therapy (Signed)
[place], [place], [address] Phone: [phone]   Fax:  [phone]  Physical Therapy Treatment  Patient Details  Name: [name] [name] MRN: [id number] Date of Birth: Aug 30, 1955 Referring Provider (PT): Tania Ade, MD   Encounter Date: 09/22/2020   PT End of Session - 09/22/20 1147    Visit Number 7    Number of Visits 13    Date for PT Re-Evaluation 10/21/20    Authorization Type UHC MCR/MCD    Progress Note Due on Visit 10    PT Start Time 1105    PT Stop Time 1147    PT Time Calculation (min) 42 min    Activity Tolerance Patient tolerated treatment well;No increased pain    Behavior During Therapy WFL for tasks assessed/performed           Past Medical History:  Diagnosis Date  . Chicken pox   . Conductive hearing loss, childhood onset   . 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 Epilepsy, followed by Dr. Tomma Rakers in neurology 08/26/2012  . Hx of adenomatous colonic polyps 11/19/2016  . Pacemaker   . Pacemaker    2012  . Seizure ([place])   . Seizures ([place])    Petite Mal     Past Surgical History:  Procedure Laterality Date  . HIP ARTHROPLASTY    . PACEMAKER INSERTION  07/2010  . TOTAL HIP ARTHROPLASTY  07/2011   right  . TOTAL HIP ARTHROPLASTY  03/2012   left  . TOTAL SHOULDER ARTHROPLASTY Right 07/15/2020   Procedure: TOTAL SHOULDER ARTHROPLASTY;  Surgeon: Tania Ade, MD;  [facility];  Service: Orthopedics;  Laterality: Right;  NEED 120 MINUTES    There were no vitals filed for this visit.   Subjective Assessment - 09/22/20 1105    Subjective Pt states that it gets hard to keep her shoulders down; it starts to hurt her neck.    Patient is accompained by: Interpreter   [name]   Pertinent History bilateral hip replacements, deafness, pacemaker, seizures    Limitations Lifting;House hold activities    Patient Stated Goals pain to be resolved, pick up heavy  things to 25#, get object out of cabinet with right arm    Currently in Pain? No/denies    Pain Onset In the past 7 days                             [facility] Adult PT Treatment/Exercise - 09/22/20 0001      Shoulder Exercises: Seated   Other Seated Exercises Cervical retraction 10 x 3 sec      Shoulder Exercises: Sidelying   External Rotation AROM;Right;20 reps    ABduction AROM;Strengthening;Right;20 reps    Other Sidelying Exercises horizontal abduction 2x10      Shoulder Exercises: Standing   Flexion AROM;Right;10 reps    Flexion Limitations at wall to maintain upper back and cervical posture    ABduction AROM;Strengthening;10 reps    ABduction Limitations at wall to maintain upper back and cervical posture      Shoulder Exercises: Stretch   Other Shoulder Stretches UT stretch x30 sec bilat; levator stretch x30 sec      Manual Therapy   Manual therapy comments UT STM and TPR    Joint Mobilization scapular mobilizations in all directions    Passive ROM Shoulder flex, abd  PT Short Term Goals - 09/09/20 1516      PT SHORT TERM GOAL #1   Title Pt will be independent with intial HEP    Status Achieved      PT SHORT TERM GOAL #2   Title Pt will increase flexion in the scapular plane to 90 in order to improve overhead activity    Baseline 100 deg on 09/09/20    Status Achieved      PT SHORT TERM GOAL #3   Title Pt will show a decrease in R shoulder pain at rest to </= 3/10 in order to help with sleep.    Baseline 3/10 at rest on 09/09/20    Status Achieved             PT Long Term Goals - 08/26/20 1424      PT LONG TERM GOAL #1   Title Pt will be indepenent with final HEP    Baseline not given    Time 8    Period Weeks    Status New    Target Date 10/21/20      PT LONG TERM GOAL #2   Title Pt will increase R shoulder strength to >/= 4/10 strength    Baseline not assessed due to precautions    Time 8    Period  Weeks    Status New    Target Date 10/21/20      PT LONG TERM GOAL #3   Title Pt will achieve WNL AROM into flexion, IR, and ER in order to promote increased R UE usage, overhead mobility, and help with dressing    Baseline flexion 75 degrees, ER 45 degrees and IR to stomach all in scapular plane and in PROM    Time 8    Period Weeks    Status New    Target Date 10/21/20      PT LONG TERM GOAL #4   Title Pt will report </= 2/10 pain with movement to help promote L UE functional mobility.    Baseline 5/10 pain at rest    Time 8    Period Weeks    Status New    Target Date 10/21/20      PT LONG TERM GOAL #5   Title Pt will be min assist with bed mobility and be able to use her R UE to help her with this to increase independence    Baseline mod-max assist from supine to/from sit and NWB on R UE    Time 8    Period Weeks    Status New    Target Date 10/21/20                 Plan - 09/22/20 1136    Clinical Impression Statement Pt with improving shoulder AROM in gravity eliminated position. Progressed HEP to work on active motion against gravity. Pt with increased neck tightness -- addressed with stretching and manual therapy this session.    Personal Factors and Comorbidities Comorbidity 3+;Age;Social Background;Past/Current Experience;Time since onset of injury/illness/exacerbation    Comorbidities bilateral hip replacements, deafness, pacemaker, seizures    Examination-Activity Limitations Lift;Hygiene/Grooming;Dressing;Carry;Caring for Others;Bed Mobility;Bathing;Reach Overhead    Examination-Participation Restrictions Cleaning;Community Activity;Driving;Yard Work;Volunteer;Laundry;Shop;Meal Prep    Stability/Clinical Decision Making Evolving/Moderate complexity    Rehab Potential Good    PT Frequency 2x / week    PT Duration 8 weeks    PT Treatment/Interventions ADLs/Self Care Home Management;Moist Heat;Cryotherapy;Manual techniques;Passive range of motion;Dry  needling;Patient/family education;Therapeutic activities;Therapeutic exercise;Ultrasound;Taping;Balance  training;Stair training;Functional mobility training;Scar mobilization;Compression bandaging;Gait training;Electrical Stimulation;Neuromuscular re-education;Joint Manipulations    PT Next Visit Plan Review HEP. Encourage neck and shoulder posture. PROM manual, progress ROM as tolerated (most limited with shoulder abduction).    PT Home Exercise Plan [place] and Agree with Plan of Care Patient           Patient will benefit from skilled therapeutic intervention in order to improve the following deficits and impairments:  Pain,Impaired flexibility,Postural dysfunction,Decreased strength,Decreased mobility,Increased edema,Decreased balance,Decreased activity tolerance,Impaired UE functional use,Decreased range of motion,Increased fascial restricitons  Visit Diagnosis: Right shoulder pain, unspecified chronicity  Stiffness of right shoulder, not elsewhere classified  Muscle weakness (generalized)     Problem List Patient Active Problem List   Diagnosis Date Noted  . 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 Hx of adenomatous colonic polyps 11/19/2016  . Localization-related idiopathic epilepsy and epileptic syndromes with seizures of localized onset, not intractable, without status epilepticus ([place]) 01/01/2015  . 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 Dizziness and giddiness 01/01/2015  . Partial epilepsy with impairment of consciousness ([place]) 09/12/2013  . 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 Deaf 08/26/2012  . Migraine 08/26/2012  . Sinus node dysfunction ([place]) 08/26/2012  . H/O bilateral hip replacements 08/26/2012    [facility] April Ma L [place] PT, DPT 09/22/2020, 11:48 AM  [facility] [address] [place], [place], [address] Phone: [phone]   Fax:  [phone]  Name: Brittay Mogle MRN: [id number] Date of  Birth: 1955/11/07

## 2020-09-24 ENCOUNTER — Ambulatory Visit: Payer: Medicare Other

## 2020-09-24 ENCOUNTER — Other Ambulatory Visit: Payer: Self-pay

## 2020-09-24 DIAGNOSIS — M25611 Stiffness of right shoulder, not elsewhere classified: Secondary | ICD-10-CM | POA: Diagnosis not present

## 2020-09-24 DIAGNOSIS — M6281 Muscle weakness (generalized): Secondary | ICD-10-CM | POA: Diagnosis not present

## 2020-09-24 DIAGNOSIS — M25511 Pain in right shoulder: Secondary | ICD-10-CM

## 2020-09-24 NOTE — Therapy (Signed)
[place], [place], [address] Phone: [phone]   Fax:  [phone]  Physical Therapy Treatment  Patient Details  Name: [name] [name] MRN: [id number] Date of Birth: Jun 24, 1955 Referring Provider (PT): Tania Ade, MD   Encounter Date: 09/24/2020   PT End of Session - 09/24/20 1340    Visit Number 8    Number of Visits 13    Date for PT Re-Evaluation 10/21/20    Authorization Type UHC MCR/MCD    Progress Note Due on Visit 10    PT Start Time 1018    PT Stop Time 1100    PT Time Calculation (min) 42 min    Activity Tolerance Patient tolerated treatment well;No increased pain    Behavior During Therapy WFL for tasks assessed/performed           Past Medical History:  Diagnosis Date  . Chicken pox   . Conductive hearing loss, childhood onset   . 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 Epilepsy, followed by Dr. Tomma Rakers in neurology 08/26/2012  . Hx of adenomatous colonic polyps 11/19/2016  . Pacemaker   . Pacemaker    2012  . Seizure ([place])   . Seizures ([place])    Petite Mal     Past Surgical History:  Procedure Laterality Date  . HIP ARTHROPLASTY    . PACEMAKER INSERTION  07/2010  . TOTAL HIP ARTHROPLASTY  07/2011   right  . TOTAL HIP ARTHROPLASTY  03/2012   left  . TOTAL SHOULDER ARTHROPLASTY Right 07/15/2020   Procedure: TOTAL SHOULDER ARTHROPLASTY;  Surgeon: Tania Ade, MD;  Location: [facility];  Service: Orthopedics;  Laterality: Right;  NEED 120 MINUTES    There were no vitals filed for this visit.   Subjective Assessment - 09/24/20 1025    Subjective Pt states her shoulder pops when she reaches to the cabinet, but no pain with that. Her neck is still hurting.    Patient is accompained by: Interpreter   [name]   Pertinent History bilateral hip replacements, deafness, pacemaker, seizures    Limitations Lifting;House hold activities    Patient Stated Goals pain to  be resolved, pick up heavy things to 25#, get object out of cabinet with right arm    Currently in Pain? No/denies    Pain Score 0-No pain    Pain Location Shoulder    Pain Onset In the past 7 days                             [facility] Adult PT Treatment/Exercise - 09/24/20 0001      Transfers   Transfers Supine to Sit    Supine to Sit 3: Mod assist    Supine to Sit Details (indicate cue type and reason) pt performs via supine to sit (no sidelying component) reaching for PT's hand and pulling strongly with B UE, signing to interpreter she worries about her hip      Self-Care   Self-Care Other Self-Care Comments    Other Self-Care Comments  see pt edu      Shoulder Exercises: Sidelying   External Rotation AROM;Right;20 reps    ABduction AROM;Strengthening;Right;20 reps    Other Sidelying Exercises horizontal abduction 2x10      Shoulder Exercises: Standing   Flexion AROM;Right;10 reps    Flexion Limitations at wall to maintain upper back and cervical posture  used L hand on R UT to incr awareness of R UT hyperactivity and shoulder hiking/elevation   ABduction AROM;Strengthening;10 reps    ABduction Limitations at wall to maintain upper back and cervical posture      Manual Therapy   Manual Therapy Joint mobilization;Soft tissue mobilization;Scapular mobilization    Soft tissue mobilization UT, pecs    Scapular Mobilization scapular mobilizations in all directions   L S/L   Passive ROM Shoulder flex/abd/ER   R UT stretch with L lateral flexion to neck and shoulder depression                 PT Education - 09/24/20 1351    Education Details Edu for using L hand on R UT to increase awareness of hyperactivity with shoulder AROM to 90, importance of scapulohumeral rhythm    Person(s) Educated Patient    Methods Explanation;Demonstration;Tactile cues;Verbal cues    Comprehension Returned demonstration;Tactile cues required;Verbal cues required;Verbalized  understanding            PT Short Term Goals - 09/09/20 1516      PT SHORT TERM GOAL #1   Title Pt will be independent with intial HEP    Status Achieved      PT SHORT TERM GOAL #2   Title Pt will increase flexion in the scapular plane to 90 in order to improve overhead activity    Baseline 100 deg on 09/09/20    Status Achieved      PT SHORT TERM GOAL #3   Title Pt will show a decrease in R shoulder pain at rest to </= 3/10 in order to help with sleep.    Baseline 3/10 at rest on 09/09/20    Status Achieved             PT Long Term Goals - 08/26/20 1424      PT LONG TERM GOAL #1   Title Pt will be indepenent with final HEP    Baseline not given    Time 8    Period Weeks    Status New    Target Date 10/21/20      PT LONG TERM GOAL #2   Title Pt will increase R shoulder strength to >/= 4/10 strength    Baseline not assessed due to precautions    Time 8    Period Weeks    Status New    Target Date 10/21/20      PT LONG TERM GOAL #3   Title Pt will achieve WNL AROM into flexion, IR, and ER in order to promote increased R UE usage, overhead mobility, and help with dressing    Baseline flexion 75 degrees, ER 45 degrees and IR to stomach all in scapular plane and in PROM    Time 8    Period Weeks    Status New    Target Date 10/21/20      PT LONG TERM GOAL #4   Title Pt will report </= 2/10 pain with movement to help promote L UE functional mobility.    Baseline 5/10 pain at rest    Time 8    Period Weeks    Status New    Target Date 10/21/20      PT LONG TERM GOAL #5   Title Pt will be min assist with bed mobility and be able to use her R UE to help her with this to increase independence    Baseline mod-max assist from supine to/from sit  and NWB on R UE    Time 8    Period Weeks    Status New    Target Date 10/21/20                 Plan - 09/24/20 1343    Clinical Impression Statement Pt presents with continued increased R UT overuse and neck  pain along UT muscle belly. Addressed with manual therapy, stretching, and alignment today. Pt unaware she has been overusing this muscle/compensating with shoulder hike. Demonstrated to pt at wall at end of session today using L hand on R UT for tactile feedback of when shoulder begins to elevate with AROM flexion. Pt reports popping in R shoulder with reach to cabinet and popping was reproduced with AROM flexion at wall when R shoulder hiked, as well as with sidelying abduction beyond 90. Via interpeter, Sarina Ser, pt verbalized she wants to wait until next session to see how she feels. She thinks she can do her exercises at home. She was educated on need to continue to normalize scapulohumeral rhythm and increase strength.    Personal Factors and Comorbidities Comorbidity 3+;Age;Social Background;Past/Current Experience;Time since onset of injury/illness/exacerbation    Comorbidities bilateral hip replacements, deafness, pacemaker, seizures    Examination-Activity Limitations Lift;Hygiene/Grooming;Dressing;Carry;Caring for Others;Bed Mobility;Bathing;Reach Overhead    Examination-Participation Restrictions Cleaning;Community Activity;Driving;Yard Work;Volunteer;Laundry;Shop;Meal Prep    Stability/Clinical Decision Making Evolving/Moderate complexity    Rehab Potential Good    PT Frequency 2x / week    PT Duration 8 weeks    PT Treatment/Interventions ADLs/Self Care Home Management;Moist Heat;Cryotherapy;Manual techniques;Passive range of motion;Dry needling;Patient/family education;Therapeutic activities;Therapeutic exercise;Ultrasound;Taping;Balance training;Stair training;Functional mobility training;Scar mobilization;Compression bandaging;Gait training;Electrical Stimulation;Neuromuscular re-education;Joint Manipulations    PT Next Visit Plan Review HEP/update PRN. Encourage neck and shoulder posture. PROM manual, progress ROM as tolerated (most limited with shoulder abduction). Discuss benefit of  futher PT    PT Home Exercise Plan [place] and Agree with Plan of Care Patient           Patient will benefit from skilled therapeutic intervention in order to improve the following deficits and impairments:  Pain,Impaired flexibility,Postural dysfunction,Decreased strength,Decreased mobility,Increased edema,Decreased balance,Decreased activity tolerance,Impaired UE functional use,Decreased range of motion,Increased fascial restricitons  Visit Diagnosis: Right shoulder pain, unspecified chronicity  Stiffness of right shoulder, not elsewhere classified  Muscle weakness (generalized)     Problem List Patient Active Problem List   Diagnosis Date Noted  . 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 Hx of adenomatous colonic polyps 11/19/2016  . Localization-related idiopathic epilepsy and epileptic syndromes with seizures of localized onset, not intractable, without status epilepticus ([place]) 01/01/2015  . 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 Dizziness and giddiness 01/01/2015  . Partial epilepsy with impairment of consciousness ([place]) 09/12/2013  . 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 Deaf 08/26/2012  . Migraine 08/26/2012  . Sinus node dysfunction ([place]) 08/26/2012  . H/O bilateral hip replacements 08/26/2012    Izell Clanton, PT, DPT 09/24/2020, 1:58 PM  [facility] [address] [place], [place], [address] Phone: [phone]   Fax:  [phone]  Name: [name] [name] MRN: [id number] Date of Birth: [birth year]

## 2020-09-29 ENCOUNTER — Ambulatory Visit: Payer: Medicare Other | Attending: Orthopedic Surgery | Admitting: Physical Therapy

## 2020-09-29 ENCOUNTER — Other Ambulatory Visit: Payer: Self-pay

## 2020-09-29 DIAGNOSIS — M6281 Muscle weakness (generalized): Secondary | ICD-10-CM | POA: Diagnosis not present

## 2020-09-29 DIAGNOSIS — M25611 Stiffness of right shoulder, not elsewhere classified: Secondary | ICD-10-CM | POA: Diagnosis not present

## 2020-09-29 DIAGNOSIS — M25511 Pain in right shoulder: Secondary | ICD-10-CM | POA: Diagnosis not present

## 2020-09-29 NOTE — Therapy (Signed)
[name], [place], [address] Phone: [phone]   Fax:  [phone]  Physical Therapy Treatment  Patient Details  Name: [name] [name] MRN: [id number] Date of Birth: Feb 29, 1956 Referring Provider (PT): [name] Ade, MD   Encounter Date: 09/29/2020   PT End of Session - 09/29/20 1155    Visit Number 9    Number of Visits 13    Date for PT Re-Evaluation 10/21/20    Authorization Type UHC MCR/MCD    Progress Note Due on Visit 10    PT Start Time 1100    PT Stop Time 1145    PT Time Calculation (min) 45 min    Activity Tolerance Patient tolerated treatment well;No increased pain    Behavior During Therapy WFL for tasks assessed/performed           Past Medical History:  Diagnosis Date  . Chicken pox   . Conductive hearing loss, childhood onset   . 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 Epilepsy, followed by Dr. Tomma Rakers in neurology 08/26/2012  . Hx of adenomatous colonic polyps 11/19/2016  . Pacemaker   . Pacemaker    2012  . Seizure ([place])   . Seizures ([place])    Petite Mal     Past Surgical History:  Procedure Laterality Date  . HIP ARTHROPLASTY    . PACEMAKER INSERTION  07/2010  . TOTAL HIP ARTHROPLASTY  07/2011   right  . TOTAL HIP ARTHROPLASTY  03/2012   left  . TOTAL SHOULDER ARTHROPLASTY Right 07/15/2020   Procedure: TOTAL SHOULDER ARTHROPLASTY;  Surgeon: [name] Ade, MD;  Location: [facility];  Service: Orthopedics;  Laterality: Right;  NEED 120 MINUTES    There were no vitals filed for this visit.   Subjective Assessment - 09/29/20 1058    Subjective Pt reports her shoulder started hurting on Monday. Pt states she was doing the exercises when she started to feel it; and then when she reached forward at the table she really felt it. Has been worried to ice or do more.    Patient is accompained by: Interpreter   [name] [name]   Pertinent History bilateral hip replacements,  deafness, pacemaker, seizures    Limitations Lifting;House hold activities    Patient Stated Goals pain to be resolved, pick up heavy things to 25#, get object out of cabinet with right arm    Pain Onset In the past 7 days                             [facility] Adult PT Treatment/Exercise - 09/29/20 0001      Shoulder Exercises: Supine   Other Supine Exercises Neck retraction 2x10      Shoulder Exercises: Seated   Other Seated Exercises Shoulder rolls x10   pt reports increased pain with this   Other Seated Exercises Scapular retraction 2x10      Shoulder Exercises: Sidelying   Other Sidelying Exercises Scapular depression, retraction, and protraction 2x10      Shoulder Exercises: Standing   Row Strengthening;Both;20 reps;Theraband    Theraband Level (Shoulder Row) Level 2 (Red)      Shoulder Exercises: Stretch   Other Shoulder Stretches UT stretch x30 sec bilat; levator stretch x30 sec    Other Shoulder Stretches Hands behind head x30 sec bilat      Modalities   Modalities Vasopneumatic  Vasopneumatic   Number Minutes Vasopneumatic  10 minutes    Vasopnuematic Location  Shoulder    Vasopneumatic Pressure Medium    Vasopneumatic Temperature  44      Manual Therapy   Soft tissue mobilization UT, pecs, supraspinatus                  PT Education - 09/29/20 1156    Education Details Reiterated improving shoulder and neck posture, importance of scapulohumeral rhythm and decreasing UT overactivity    Person(s) Educated Patient    Methods Explanation;Demonstration;Tactile cues;Verbal cues    Comprehension Verbalized understanding;Returned demonstration;Verbal cues required;Tactile cues required            PT Short Term Goals - 09/09/20 1516      PT SHORT TERM GOAL #1   Title Pt will be independent with intial HEP    Status Achieved      PT SHORT TERM GOAL #2   Title Pt will increase flexion in the scapular plane to 90 in order to improve  overhead activity    Baseline 100 deg on 09/09/20    Status Achieved      PT SHORT TERM GOAL #3   Title Pt will show a decrease in R shoulder pain at rest to </= 3/10 in order to help with sleep.    Baseline 3/10 at rest on 09/09/20    Status Achieved             PT Long Term Goals - 08/26/20 1424      PT LONG TERM GOAL #1   Title Pt will be indepenent with final HEP    Baseline not given    Time 8    Period Weeks    Status New    Target Date 10/21/20      PT LONG TERM GOAL #2   Title Pt will increase R shoulder strength to >/= 4/10 strength    Baseline not assessed due to precautions    Time 8    Period Weeks    Status New    Target Date 10/21/20      PT LONG TERM GOAL #3   Title Pt will achieve WNL AROM into flexion, IR, and ER in order to promote increased R UE usage, overhead mobility, and help with dressing    Baseline flexion 75 degrees, ER 45 degrees and IR to stomach all in scapular plane and in PROM    Time 8    Period Weeks    Status New    Target Date 10/21/20      PT LONG TERM GOAL #4   Title Pt will report </= 2/10 pain with movement to help promote L UE functional mobility.    Baseline 5/10 pain at rest    Time 8    Period Weeks    Status New    Target Date 10/21/20      PT LONG TERM GOAL #5   Title Pt will be min assist with bed mobility and be able to use her R UE to help her with this to increase independence    Baseline mod-max assist from supine to/from sit and NWB on R UE    Time 8    Period Weeks    Status New    Target Date 10/21/20                 Plan - 09/29/20 1157    Clinical Impression Statement Pt reports increased shoulder  pain and soreness since Monday after performing her normal exercises and reaching forward. Concern that pt performs her shoulder AROM in standing with poor scapulohumeral rhythm leading to suprispinatus tendinopathy. Manual performed to address tight UT and supraspinatus trigger points. Treatment focused  primarily on scapular strengthening and control.    Personal Factors and Comorbidities Comorbidity 3+;Age;Social Background;Past/Current Experience;Time since onset of injury/illness/exacerbation    Comorbidities bilateral hip replacements, deafness, pacemaker, seizures    Examination-Activity Limitations Lift;Hygiene/Grooming;Dressing;Carry;Caring for Others;Bed Mobility;Bathing;Reach Overhead    Examination-Participation Restrictions Cleaning;Community Activity;Driving;Yard Work;Volunteer;Laundry;Shop;Meal Prep    Stability/Clinical Decision Making Evolving/Moderate complexity    Rehab Potential Good    PT Frequency 2x / week    PT Duration 8 weeks    PT Treatment/Interventions ADLs/Self Care Home Management;Moist Heat;Cryotherapy;Manual techniques;Passive range of motion;Dry needling;Patient/family education;Therapeutic activities;Therapeutic exercise;Ultrasound;Taping;Balance training;Stair training;Functional mobility training;Scar mobilization;Compression bandaging;Gait training;Electrical Stimulation;Neuromuscular re-education;Joint Manipulations    PT Next Visit Plan Review HEP/update PRN. Encourage neck and shoulder posture. PROM manual, progress ROM as tolerated (most limited with shoulder abduction).    PT Home Exercise Plan [place] and Agree with Plan of Care Patient           Patient will benefit from skilled therapeutic intervention in order to improve the following deficits and impairments:  Pain,Impaired flexibility,Postural dysfunction,Decreased strength,Decreased mobility,Increased edema,Decreased balance,Decreased activity tolerance,Impaired UE functional use,Decreased range of motion,Increased fascial restricitons  Visit Diagnosis: Right shoulder pain, unspecified chronicity  Stiffness of right shoulder, not elsewhere classified  Muscle weakness (generalized)     Problem List Patient Active Problem List   Diagnosis Date Noted  . Squamous cell  carcinoma, 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  . Hx of adenomatous colonic polyps 11/19/2016  . Localization-related idiopathic epilepsy and epileptic syndromes with seizures of localized onset, not intractable, without status epilepticus ([place]) 01/01/2015  . 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 Dizziness and giddiness 01/01/2015  . Partial epilepsy with impairment of consciousness ([place]) 09/12/2013  . 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 Deaf 08/26/2012  . Migraine 08/26/2012  . Sinus node dysfunction ([place]) 08/26/2012  . H/O bilateral hip replacements 08/26/2012    [facility] April Ma L Shavanna Furnari PT, DPT 09/29/2020, 12:52 PM  [facility] [address] [place], [place], [address] Phone: [phone]   Fax:  [phone]  Name: [name] [name] MRN: [id number] Date of Birth: [birth year]

## 2020-10-05 ENCOUNTER — Ambulatory Visit: Payer: Medicare Other | Admitting: Physical Therapy

## 2020-10-05 ENCOUNTER — Other Ambulatory Visit: Payer: Self-pay

## 2020-10-05 DIAGNOSIS — M6281 Muscle weakness (generalized): Secondary | ICD-10-CM | POA: Diagnosis not present

## 2020-10-05 DIAGNOSIS — M25511 Pain in right shoulder: Secondary | ICD-10-CM | POA: Diagnosis not present

## 2020-10-05 DIAGNOSIS — M25611 Stiffness of right shoulder, not elsewhere classified: Secondary | ICD-10-CM | POA: Diagnosis not present

## 2020-10-05 NOTE — Therapy (Signed)
[place], [place], [address] Phone: [phone]   Fax:  [phone]  Physical Therapy Treatment and 10th Visit Progress Note  Patient Details  Name: [name] [name] MRN: [id number] Date of Birth: [birth year] Referring Provider (PT): Tania Ade, MD  Progress Note Reporting Period 08/26/20 to 10/05/2020  See note below for Objective Data and Assessment of Progress/Goals.       Encounter Date: 10/05/2020   PT End of Session - 10/05/20 1330    Visit Number 10    Number of Visits 13    Date for PT Re-Evaluation 10/21/20    Authorization Type UHC MCR/MCD    Progress Note Due on Visit 10    PT Start Time 1330    PT Stop Time 1415    PT Time Calculation (min) 45 min    Activity Tolerance Patient tolerated treatment well;No increased pain    Behavior During Therapy WFL for tasks assessed/performed           Past Medical History:  Diagnosis Date  . Chicken pox   . Conductive hearing loss, childhood onset   . 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 Epilepsy, followed by Dr. Tomma Rakers in neurology 08/26/2012  . Hx of adenomatous colonic polyps 11/19/2016  . Pacemaker   . Pacemaker    2012  . Seizure ([place])   . Seizures ([place])    Petite Mal     Past Surgical History:  Procedure Laterality Date  . HIP ARTHROPLASTY    . PACEMAKER INSERTION  07/2010  . TOTAL HIP ARTHROPLASTY  07/2011   right  . TOTAL HIP ARTHROPLASTY  03/2012   left  . TOTAL SHOULDER ARTHROPLASTY Right 07/15/2020   Procedure: TOTAL SHOULDER ARTHROPLASTY;  Surgeon: Tania Ade, MD;  Location: [facility];  Service: Orthopedics;  Laterality: Right;  NEED 120 MINUTES    There were no vitals filed for this visit.   Subjective Assessment - 10/05/20 1335    Subjective Pt states her shoulder is feeling better after the last PT session. Pt has been trying to do exercises in front of mirror to decrease upper trap overactivity.     Patient is accompained by: Interpreter   [name]   Pertinent History bilateral hip replacements, deafness, pacemaker, seizures    Limitations Lifting;House hold activities    Patient Stated Goals pain to be resolved, pick up heavy things to 25#, get object out of cabinet with right arm    Pain Onset In the past 7 days              [facility] PT Assessment - 10/05/20 0001      AROM   Right Shoulder Flexion 140 Degrees    Right Shoulder ABduction 117 Degrees    Right Shoulder External Rotation 53 Degrees                         [facility] Adult PT Treatment/Exercise - 10/05/20 0001      Shoulder Exercises: Supine   External Rotation Strengthening;Right;Theraband;20 reps    Theraband Level (Shoulder External Rotation) Level 1 (Yellow)    Internal Rotation Strengthening;Right;20 reps;Theraband    Theraband Level (Shoulder Internal Rotation) Level 1 (Yellow)    Flexion AROM;Strengthening;Theraband;20 reps    Theraband Level (Shoulder Flexion) Level 1 (Yellow)    ABduction AROM;Strengthening;Right;20 reps    ABduction Limitations AAROM with available end range  Shoulder Exercises: Standing   Horizontal ABduction Strengthening;Theraband;20 reps    Theraband Level (Shoulder Horizontal ABduction) Level 1 (Yellow)    Extension Strengthening;Both;20 reps;Theraband    Theraband Level (Shoulder Extension) Level 3 (Green)    Row Strengthening;Both;20 reps;Theraband    Theraband Level (Shoulder Row) Level 3 (Green)      Shoulder Exercises: Stretch   Other Shoulder Stretches Doorway pec stretch x30 sec    Other Shoulder Stretches Snow angel stretch x30 sec      Manual Therapy   Passive ROM Shoulder flex/abd/ER                    PT Short Term Goals - 09/09/20 1516      PT SHORT TERM GOAL #1   Title Pt will be independent with intial HEP    Status Achieved      PT SHORT TERM GOAL #2   Title Pt will increase flexion in the scapular plane to 90 in order to  improve overhead activity    Baseline 100 deg on 09/09/20    Status Achieved      PT SHORT TERM GOAL #3   Title Pt will show a decrease in R shoulder pain at rest to </= 3/10 in order to help with sleep.    Baseline 3/10 at rest on 09/09/20    Status Achieved             PT Long Term Goals - 10/05/20 1651      PT LONG TERM GOAL #1   Title Pt will be indepenent with final HEP    Baseline not given    Time 8    Period Weeks    Status Achieved      PT LONG TERM GOAL #2   Title Pt will increase R shoulder strength to >/= 4/10 strength    Baseline not able to perform without compensations 10/05/20    Time 8    Period Weeks    Status On-going      PT LONG TERM GOAL #3   Title Pt will achieve WNL AROM into flexion, IR, and ER in order to promote increased R UE usage, overhead mobility, and help with dressing    Baseline flexion 75 degrees, ER 45 degrees and IR to stomach all in scapular plane and in PROM    Time 8    Period Weeks    Status On-going      PT LONG TERM GOAL #4   Title Pt will report </= 2/10 pain with movement to help promote L UE functional mobility.    Baseline No pain currently 10/05/20    Time 8    Period Weeks    Status Achieved      PT LONG TERM GOAL #5   Title Pt will be min assist with bed mobility and be able to use her R UE to help her with this to increase independence    Baseline mod-max assist from supine to/from sit and NWB on R UE    Time 8    Period Weeks    Status On-going                 Plan - 10/05/20 1422    Clinical Impression Statement 10th visit progress note this session. Pt with improving ROM (see assessment). Less shoulder pain noted. Able to initiate resistance band strengthening in supine -- pt not able to perform >90 deg in standing/sitting without upper trap overactivity. Continued  to work on English as a second language teacher. Pt has been trying to perform her exercises in standing in front of a mirror to reduce shoulder elevation but  has continued difficulties. Pt is slowly meeting her LTGs.    Personal Factors and Comorbidities Comorbidity 3+;Age;Social Background;Past/Current Experience;Time since onset of injury/illness/exacerbation    Comorbidities bilateral hip replacements, deafness, pacemaker, seizures    Examination-Activity Limitations Lift;Hygiene/Grooming;Dressing;Carry;Caring for Others;Bed Mobility;Bathing;Reach Overhead    Examination-Participation Restrictions Cleaning;Community Activity;Driving;Yard Work;Volunteer;Laundry;Shop;Meal Prep    Stability/Clinical Decision Making Evolving/Moderate complexity    Rehab Potential Good    PT Frequency 2x / week    PT Duration 8 weeks    PT Treatment/Interventions ADLs/Self Care Home Management;Moist Heat;Cryotherapy;Manual techniques;Passive range of motion;Dry needling;Patient/family education;Therapeutic activities;Therapeutic exercise;Ultrasound;Taping;Balance training;Stair training;Functional mobility training;Scar mobilization;Compression bandaging;Gait training;Electrical Stimulation;Neuromuscular re-education;Joint Manipulations    PT Next Visit Plan Review HEP/update PRN. Encourage neck and shoulder posture. PROM manual, progress ROM as tolerated (most limited with shoulder abduction).    PT Home Exercise Plan [place] and Agree with Plan of Care Patient           Patient will benefit from skilled therapeutic intervention in order to improve the following deficits and impairments:  Pain,Impaired flexibility,Postural dysfunction,Decreased strength,Decreased mobility,Increased edema,Decreased balance,Decreased activity tolerance,Impaired UE functional use,Decreased range of motion,Increased fascial restricitons  Visit Diagnosis: Right shoulder pain, unspecified chronicity  Stiffness of right shoulder, not elsewhere classified  Muscle weakness (generalized)     Problem List Patient Active Problem List   Diagnosis Date Noted  . Squamous  cell carcinoma, 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  . Hx of adenomatous colonic polyps 11/19/2016  . Localization-related idiopathic epilepsy and epileptic syndromes with seizures of localized onset, not intractable, without status epilepticus ([place]) 01/01/2015  . 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 Dizziness and giddiness 01/01/2015  . Partial epilepsy with impairment of consciousness ([place]) 09/12/2013  . 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 Deaf 08/26/2012  . Migraine 08/26/2012  . Sinus node dysfunction ([place]) 08/26/2012  . H/O bilateral hip replacements 08/26/2012    [facility] April Ma L Antar Milks PT, DPT 10/05/2020, 4:52 PM  [facility] [address] [place], [place], [address] Phone: [phone]   Fax:  [phone]  Name: [name] [name] MRN: [id number] Date of Birth: 03-11-1956

## 2020-10-07 ENCOUNTER — Other Ambulatory Visit: Payer: Self-pay

## 2020-10-07 ENCOUNTER — Ambulatory Visit: Payer: Medicare Other | Admitting: Physical Therapy

## 2020-10-07 DIAGNOSIS — M25611 Stiffness of right shoulder, not elsewhere classified: Secondary | ICD-10-CM | POA: Diagnosis not present

## 2020-10-07 DIAGNOSIS — M25511 Pain in right shoulder: Secondary | ICD-10-CM | POA: Diagnosis not present

## 2020-10-07 DIAGNOSIS — M6281 Muscle weakness (generalized): Secondary | ICD-10-CM

## 2020-10-07 NOTE — Therapy (Signed)
[place], [place], [address] Phone: [phone]   Fax:  [phone]  Physical Therapy Treatment  Patient Details  Name: [name] [name] MRN: [id number] Date of Birth: Aug 10, 1955 Referring Provider (PT): Tania Ade, MD   Encounter Date: 10/07/2020   PT End of Session - 10/07/20 0754     Visit Number 11    Number of Visits 13    Date for PT Re-Evaluation 10/21/20    Authorization Type UHC MCR/MCD    Progress Note Due on Visit 10    PT Start Time 0800    PT Stop Time 0845    PT Time Calculation (min) 45 min    Activity Tolerance Patient tolerated treatment well;No increased pain    Behavior During Therapy [facility] for tasks assessed/performed             Past Medical History:  Diagnosis Date   Chicken pox    Conductive hearing loss, childhood onset    Deafness    from medication a a child for chicken pox   Epilepsy, followed by Dr. Tomma Rakers in neurology 08/26/2012   Hx of adenomatous colonic polyps 11/19/2016   Pacemaker    Pacemaker    2012   Seizure ([place])    Seizures ([place])    Petite Mal     Past Surgical History:  Procedure Laterality Date   HIP ARTHROPLASTY     PACEMAKER INSERTION  07/2010   TOTAL HIP ARTHROPLASTY  07/2011   right   TOTAL HIP ARTHROPLASTY  03/2012   left   TOTAL SHOULDER ARTHROPLASTY Right 07/15/2020   Procedure: TOTAL SHOULDER ARTHROPLASTY;  Surgeon: Tania Ade, MD;  Location: [facility];  Service: Orthopedics;  Laterality: Right;  NEED 120 MINUTES    There were no vitals filed for this visit.   Subjective Assessment - 10/07/20 0810     Subjective Pt reports some soreness after last session. She states she felt her shoulder tighten up but she tried to massage it out and is feeling better. She has been able to perform some of her strengthening exercises.    Patient is accompained by: Interpreter   [name] [name]   Pertinent History bilateral hip replacements, deafness,  pacemaker, seizures    Limitations Lifting;House hold activities    Patient Stated Goals pain to be resolved, pick up heavy things to 25#, get object out of cabinet with right arm    Currently in Pain? No/denies    Pain Onset In the past 7 days                               [facility] Adult PT Treatment/Exercise - 10/07/20 0001       Shoulder Exercises: Supine   Horizontal ABduction Strengthening;Both;20 reps;Theraband    Theraband Level (Shoulder Horizontal ABduction) Level 1 (Yellow)    External Rotation Strengthening;Right;Theraband;20 reps    Theraband Level (Shoulder External Rotation) Level 1 (Yellow)    Internal Rotation Strengthening;Right;20 reps;Theraband    Theraband Level (Shoulder Internal Rotation) Level 1 (Yellow)    Flexion AROM;Strengthening;Theraband;20 reps    Theraband Level (Shoulder Flexion) Level 1 (Yellow)    ABduction AROM;Strengthening;Right;10 reps      Shoulder Exercises: Sidelying   ABduction AROM;Strengthening;Right;10 reps      Shoulder Exercises: Standing   Extension Strengthening;Both;20 reps;Theraband    Theraband Level (Shoulder Extension) Level 3 (Green)    Row Strengthening;Both;20 reps;Theraband    Theraband  Level (Shoulder Row) Level 3 (Green)    Diagonals AROM;Right;10 reps    Diagonals Limitations at wall    Other Standing Exercises Wall push-up 2x10      Shoulder Exercises: Pulleys   ABduction 1 minute                      PT Short Term Goals - 09/09/20 1516       PT SHORT TERM GOAL #1   Title Pt will be independent with intial HEP    Status Achieved      PT SHORT TERM GOAL #2   Title Pt will increase flexion in the scapular plane to 90 in order to improve overhead activity    Baseline 100 deg on 09/09/20    Status Achieved      PT SHORT TERM GOAL #3   Title Pt will show a decrease in R shoulder pain at rest to </= 3/10 in order to help with sleep.    Baseline 3/10 at rest on 09/09/20    Status  Achieved               PT Long Term Goals - 10/05/20 1651       PT LONG TERM GOAL #1   Title Pt will be indepenent with final HEP    Baseline not given    Time 8    Period Weeks    Status Achieved      PT LONG TERM GOAL #2   Title Pt will increase R shoulder strength to >/= 4/10 strength    Baseline not able to perform without compensations 10/05/20    Time 8    Period Weeks    Status On-going      PT LONG TERM GOAL #3   Title Pt will achieve WNL AROM into flexion, IR, and ER in order to promote increased R UE usage, overhead mobility, and help with dressing    Baseline flexion 75 degrees, ER 45 degrees and IR to stomach all in scapular plane and in PROM    Time 8    Period Weeks    Status On-going      PT LONG TERM GOAL #4   Title Pt will report </= 2/10 pain with movement to help promote L UE functional mobility.    Baseline No pain currently 10/05/20    Time 8    Period Weeks    Status Achieved      PT LONG TERM GOAL #5   Title Pt will be min assist with bed mobility and be able to use her R UE to help her with this to increase independence    Baseline mod-max assist from supine to/from sit and NWB on R UE    Time 8    Period Weeks    Status On-going                   Plan - 10/07/20 4196     Clinical Impression Statement Pt with improving shoulder strength and endurance. Shoulder abduction remains the weakest - pt not yet ready to perform this with resistance. Continued to work on strengthening in supine as pt does not compensate with shoulder elevation. Continuing to work on shoulder elevation without upper trap compensation in standing and sitting (this improved after working with the pulley)    Personal Factors and Comorbidities Comorbidity 3+;Age;Social Background;Past/Current Experience;Time since onset of injury/illness/exacerbation    Comorbidities bilateral hip replacements, deafness, pacemaker, seizures  Examination-Activity Limitations  Lift;Hygiene/Grooming;Dressing;Carry;Caring for Others;Bed Mobility;Bathing;Reach Overhead    Examination-Participation Restrictions Cleaning;Community Activity;Driving;Yard Work;Volunteer;Laundry;Shop;Meal Prep    Stability/Clinical Decision Making Evolving/Moderate complexity    Rehab Potential Good    PT Frequency 2x / week    PT Duration 8 weeks    PT Treatment/Interventions ADLs/Self Care Home Management;Moist Heat;Cryotherapy;Manual techniques;Passive range of motion;Dry needling;Patient/family education;Therapeutic activities;Therapeutic exercise;Ultrasound;Taping;Balance training;Stair training;Functional mobility training;Scar mobilization;Compression bandaging;Gait training;Electrical Stimulation;Neuromuscular re-education;Joint Manipulations    PT Next Visit Plan Review HEP/update PRN. Encourage neck and shoulder posture. PROM manual, progress ROM as tolerated (most limited with shoulder abduction).    PT Home Exercise Plan [place] and Agree with Plan of Care Patient             Patient will benefit from skilled therapeutic intervention in order to improve the following deficits and impairments:  Pain, Impaired flexibility, Postural dysfunction, Decreased strength, Decreased mobility, Increased edema, Decreased balance, Decreased activity tolerance, Impaired UE functional use, Decreased range of motion, Increased fascial restricitons  Visit Diagnosis: Right shoulder pain, unspecified chronicity  Stiffness of right shoulder, not elsewhere classified  Muscle weakness (generalized)     Problem List Patient Active Problem List   Diagnosis Date Noted   Squamous cell carcinoma, 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   Hx of adenomatous colonic polyps 11/19/2016   Localization-related idiopathic epilepsy and epileptic syndromes with seizures of localized onset, not intractable, without status epilepticus ([place]) 01/01/2015   Drop attack 01/01/2015   Dizziness and giddiness 01/01/2015    Partial epilepsy with impairment of consciousness ([place]) 09/12/2013   Pacemaker-dual chamber medtronic 10/25/2012   Deaf 08/26/2012   Migraine 08/26/2012   Sinus node dysfunction ([place]) 08/26/2012   H/O bilateral hip replacements 08/26/2012    [facility] April Ma L Cayleb Jarnigan PT, DPT 10/07/2020, 8:42 AM  [facility] [address] [place], [place], [address] Phone: [phone]   Fax:  [phone]  Name: [name] [name] MRN: [id number] Date of Birth: 04/24/1956

## 2020-10-08 ENCOUNTER — Encounter: Payer: Self-pay | Admitting: Family Medicine

## 2020-10-08 ENCOUNTER — Ambulatory Visit (INDEPENDENT_AMBULATORY_CARE_PROVIDER_SITE_OTHER): Payer: Medicare Other | Admitting: Family Medicine

## 2020-10-08 VITALS — BP 122/80 | HR 69 | Temp 97.7°F | Ht 62.0 in | Wt 179.8 lb

## 2020-10-08 DIAGNOSIS — Z13220 Encounter for screening for lipoid disorders: Secondary | ICD-10-CM | POA: Diagnosis not present

## 2020-10-08 DIAGNOSIS — R739 Hyperglycemia, unspecified: Secondary | ICD-10-CM

## 2020-10-08 DIAGNOSIS — R635 Abnormal weight gain: Secondary | ICD-10-CM | POA: Diagnosis not present

## 2020-10-08 DIAGNOSIS — R748 Abnormal levels of other serum enzymes: Secondary | ICD-10-CM

## 2020-10-08 DIAGNOSIS — I10 Essential (primary) hypertension: Secondary | ICD-10-CM

## 2020-10-08 DIAGNOSIS — R4589 Other symptoms and signs involving emotional state: Secondary | ICD-10-CM

## 2020-10-08 DIAGNOSIS — G40009 Localization-related (focal) (partial) idiopathic epilepsy and epileptic syndromes with seizures of localized onset, not intractable, without status epilepticus: Secondary | ICD-10-CM

## 2020-10-08 NOTE — Patient Instructions (Addendum)
For constipation: can try either metamucil or miralax rather than prune juice. I think these would be more effective for you and you could save some calories/sugars by trying this (miralax may be more effective. Sometimes it takes a few doses to kick in. If stools get too loose, then you can back off to half cap-ful or taking every other day).   Your labwork looked good overall! Here were my notes on it: *liver enzymes are all normal which is great. No further evaluation needed at this time.  *cholesterol looks good *blood sugar is stable

## 2020-10-08 NOTE — Progress Notes (Signed)
Paul Torpey DOB: 1956-02-22 Encounter date: 10/08/2020  This is a 65 y.o. female who presents with Chief Complaint  Patient presents with   Follow-up    History of present illness:  ASL translator was present for entire visit/physical.  Wondered about getting bloodwork done so regularly and why she needs this.  We discussed that medications she is on may affect the liver and that she has had some elevated liver enzymes.  We discussed that sometimes this is also related to weight gain.  We reviewed her abdominal ultrasound where we evaluated the liver.  We discussed that typically we would get blood work about every 6 months for her and hopefully if looking normal we can have more time before next evaluation.  Does have constipation regularly; prunes help/prune juice but has noted she has to take in a lot to help (ie 8 oz prune juice).  We discussed other treatment options like MiraLAX and Metamucil.  We discussed it may be beneficial to cut out some fruit juices to help with blood sugar control and weight loss.  Did loose weight when she had stomach bug; but gained after last bug. Walks all the time with dog. Happy with weight around 170.  HTN: started amlodipine 2.5mg  after last visit. She is taking regularly.  She has had no problems with this medication.  Has been taking omega for awhile; but stopped last year.   Feels like sometimes brain goes into dream mode. She worries about fall/hospital visit from earlier this year. Reassured by normal eval, but still worries about it. Feels that mood is a little depressed overall. Rarely will feel anxious. Doesn't want to talk with anyone. Started maybe last year. During christmas when under more stress; realized she needed to make appointments for herself.    No Known Allergies Current Meds  Medication Sig   amLODipine (NORVASC) 2.5 MG tablet Take 2.5 mg by mouth as needed.   Ascorbic Acid (VITAMIN C PO) Take 1 tablet by mouth in the morning  and at bedtime.   carbamazepine (CARBATROL) 300 MG 12 hr capsule Take 1-2 capsules (300-600 mg total) by mouth See admin instructions. Take 1 capsule in the morning, and 2 capsules in the evening.   Cholecalciferol (VITAMIN D) 2000 UNITS CAPS Take 2,000 Units by mouth 2 (two) times daily.   folic acid (FOLVITE) 1 MG tablet Take 1 mg by mouth daily.   LamoTRIgine 300 MG TB24 24 hour tablet Take 1 tablet by mouth twice daily   magnesium 30 MG tablet Take 30 mg by mouth daily.    Multiple Vitamin (MULTIVITAMIN WITH MINERALS) TABS tablet Take 2 tablets by mouth daily.   Oxcarbazepine (TRILEPTAL) 300 MG tablet Take 1 tablet (300 mg total) by mouth See admin instructions. Take one tablet by mouth in the morning and 2 in the evening.   oxyCODONE-acetaminophen (PERCOCET) 5-325 MG tablet Take 1-2 tablets every 4 hours as needed for post operative pain. MAX 6/day   tiZANidine (ZANAFLEX) 4 MG tablet Take 1 tablet (4 mg total) by mouth every 8 (eight) hours as needed for muscle spasms.    Review of Systems  Constitutional:  Positive for fatigue (sometimes feels a little more tired than she used to). Negative for chills and fever.  Respiratory:  Negative for cough, chest tightness, shortness of breath and wheezing.   Cardiovascular:  Negative for chest pain, palpitations and leg swelling.  Neurological:  Positive for syncope (she has had a couple of falls without understanding why;  has been to ER for these). Negative for dizziness and headaches.   Objective:  BP 122/80 (BP Location: Left Arm, Patient Position: Sitting, Cuff Size: Large)   Pulse 69   Temp 97.7 F (36.5 C) (Oral)   Ht 5\' 2"  (1.575 m)   Wt 179 lb 12.8 oz (81.6 kg)   LMP 08/29/2005   SpO2 98%   BMI 32.89 kg/m   Weight: 179 lb 12.8 oz (81.6 kg)   BP Readings from Last 3 Encounters:  10/08/20 122/80  07/17/20 124/62  07/15/20 (!) 154/78   Wt Readings from Last 3 Encounters:  10/08/20 179 lb 12.8 oz (81.6 kg)  07/17/20 156 lb (70.8  kg)  07/09/20 169 lb 12.8 oz (77 kg)    Physical Exam Constitutional:      General: She is not in acute distress.    Appearance: She is well-developed.  Cardiovascular:     Rate and Rhythm: Normal rate and regular rhythm.     Heart sounds: Normal heart sounds. No murmur heard.   No friction rub.  Pulmonary:     Effort: Pulmonary effort is normal. No respiratory distress.     Breath sounds: Normal breath sounds. No wheezing or rales.  Abdominal:     General: Bowel sounds are normal.     Palpations: Abdomen is soft. There is no hepatomegaly or splenomegaly.     Tenderness: There is no abdominal tenderness.  Musculoskeletal:     Right lower leg: No edema.     Left lower leg: No edema.  Neurological:     Mental Status: She is alert and oriented to person, place, and time.  Psychiatric:        Behavior: Behavior normal.    Assessment/Plan 1. Elevated liver enzymes Recheck labs today; if normal no further evaluation needed. We discussed regular liver screening to monitor. Encouraged limiting supplements/vitamins which we discussed are filtered through the liver.  - Gamma GT; Future - Hepatic function panel; Future - Gamma GT - Hepatic function panel  2. Weight gain She is going to work on regular exercise. I did give her handout of diabetic diet sample eating plan and information on non starchy veggies/appropriate serving sizes for food.   3. Hypertension, unspecified type Well controlled. Continue amlodipine 2.5mg    4. Sad mood She feels that mood is stable right now. Not interested in therapy. We discussed that there are options like therapy and medication if worsening.   5. Localization-related idiopathic epilepsy and epileptic syndromes with seizures of localized onset, not intractable, without status epilepticus ([place]) Follows with neurology.I will message Dr. [name] about her complaint of "dream like" state she is feeling on occasion to see if he would like sooner follow  up with her. Last seizure suspected 07/17/20 when she had ER visit for LOC.  - 10-Hydroxycarbazepine; Future - Lamotrigine level; Future - 10-Hydroxycarbazepine - Lamotrigine level  6. Hyperglycemia Stable. We discussed low carb diet and working on lean protein as well as non starchy veggies. We discussed portion control. Hand out (diabetic) given.  - Hemoglobin A1c; Future - Hemoglobin A1c  7. Lipid screening - Lipid panel; Future - Lipid panel  Return in about 6 months (around 04/09/2021) for physical exam.       Micheline Rough, MD

## 2020-10-12 ENCOUNTER — Ambulatory Visit: Payer: Medicare Other | Admitting: Physical Therapy

## 2020-10-12 ENCOUNTER — Other Ambulatory Visit: Payer: Self-pay

## 2020-10-12 DIAGNOSIS — M25611 Stiffness of right shoulder, not elsewhere classified: Secondary | ICD-10-CM | POA: Diagnosis not present

## 2020-10-12 DIAGNOSIS — M25511 Pain in right shoulder: Secondary | ICD-10-CM | POA: Diagnosis not present

## 2020-10-12 DIAGNOSIS — M6281 Muscle weakness (generalized): Secondary | ICD-10-CM

## 2020-10-12 LAB — HEPATIC FUNCTION PANEL
AG Ratio: 1.5 (calc) (ref 1.0–2.5)
ALT: 16 U/L (ref 6–29)
AST: 19 U/L (ref 10–35)
Albumin: 4.4 g/dL (ref 3.6–5.1)
Alkaline phosphatase (APISO): 96 U/L (ref 37–153)
Bilirubin, Direct: 0.1 mg/dL (ref 0.0–0.2)
Globulin: 3 g/dL (calc) (ref 1.9–3.7)
Indirect Bilirubin: 0.3 mg/dL (calc) (ref 0.2–1.2)
Total Bilirubin: 0.4 mg/dL (ref 0.2–1.2)
Total Protein: 7.4 g/dL (ref 6.1–8.1)

## 2020-10-12 LAB — LIPID PANEL
Cholesterol: 210 mg/dL — ABNORMAL HIGH (ref <200)
HDL: 81 mg/dL (ref >=50)
LDL Cholesterol (Calc): 115 mg/dL (calc) — ABNORMAL HIGH
Non-HDL Cholesterol (Calc): 129 mg/dL (calc) (ref <130)
Total CHOL/HDL Ratio: 2.6 (calc) (ref <5.0)
Triglycerides: 46 mg/dL (ref <150)

## 2020-10-12 LAB — HEMOGLOBIN A1C
Hgb A1c MFr Bld: 5 % of total Hgb (ref <5.7)
Mean Plasma Glucose: 97 mg/dL
eAG (mmol/L): 5.4 mmol/L

## 2020-10-12 LAB — 10-HYDROXYCARBAZEPINE: Triliptal/MTB(Oxcarbazepin): 17.8 ug/mL (ref 8.0–35.0)

## 2020-10-12 LAB — GAMMA GT: GGT: 61 U/L (ref 3–65)

## 2020-10-12 LAB — LAMOTRIGINE LEVEL: Lamotrigine Lvl: 8.8 ug/mL (ref 4.0–18.0)

## 2020-10-12 NOTE — Therapy (Signed)
[place], [place], [address] Phone: [phone]   Fax:  [phone]  Physical Therapy Treatment  Patient Details  Name: [name] [name] MRN: [id number] Date of Birth: May 12, 1955 Referring Provider (PT): Tania Ade, MD   Encounter Date: 10/12/2020   PT End of Session - 10/12/20 1551     Visit Number 12    Number of Visits 13    Date for PT Re-Evaluation 10/21/20    Authorization Type UHC MCR/MCD    Authorization Time Period KX by 15th    PT Start Time 1551    PT Stop Time 1630    PT Time Calculation (min) 39 min    Activity Tolerance Patient tolerated treatment well;No increased pain    Behavior During Therapy [facility] for tasks assessed/performed             Past Medical History:  Diagnosis Date   Chicken pox    Conductive hearing loss, childhood onset    Deafness    from medication a a child for chicken pox   Epilepsy, followed by Dr. Tomma Rakers in neurology 08/26/2012   Hx of adenomatous colonic polyps 11/19/2016   Pacemaker    Pacemaker    2012   Seizure ([place])    Seizures ([place])    Petite Mal     Past Surgical History:  Procedure Laterality Date   HIP ARTHROPLASTY     PACEMAKER INSERTION  07/2010   TOTAL HIP ARTHROPLASTY  07/2011   right   TOTAL HIP ARTHROPLASTY  03/2012   left   TOTAL SHOULDER ARTHROPLASTY Right 07/15/2020   Procedure: TOTAL SHOULDER ARTHROPLASTY;  Surgeon: Tania Ade, MD;  Location: [facility];  Service: Orthopedics;  Laterality: Right;  NEED 120 MINUTES    There were no vitals filed for this visit.   Subjective Assessment - 10/12/20 1553     Subjective Pt reports some pain with eccentric control of shoulder abduction. Nothing else new to report.    Patient is accompained by: Interpreter   [name]   Pertinent History bilateral hip replacements, deafness, pacemaker, seizures    Limitations Lifting;House hold activities    Patient Stated Goals pain to be resolved,  pick up heavy things to 25#, get object out of cabinet with right arm    Pain Onset In the past 7 days                               [facility] Adult PT Treatment/Exercise - 10/12/20 0001       Shoulder Exercises: Standing   External Rotation Strengthening;Theraband;20 reps;Right    Theraband Level (Shoulder External Rotation) Level 3 (Green)    Internal Rotation Strengthening;Right;20 reps;Theraband    Theraband Level (Shoulder Internal Rotation) Level 3 (Green)    Flexion AROM;Right;10 reps    ABduction AAROM;Right;10 reps   iso hold at end range 2x10"   Extension Strengthening;Both;20 reps;Theraband    Theraband Level (Shoulder Extension) Level 4 (Blue)    Row Strengthening;Both;20 reps;Theraband    Theraband Level (Shoulder Row) Level 4 (Blue)    Other Standing Exercises bicep curl red tband 2x10      Shoulder Exercises: Pulleys   Flexion 1 minute    ABduction 1 minute      Shoulder Exercises: ROM/Strengthening   Wall Wash flexion & abduction x10 each to fatigue      Shoulder Exercises: Stretch   Other Shoulder Stretches Doorway pec  stretch x30 sec    Other Shoulder Stretches Bilat UT stretch x30 sec                      PT Short Term Goals - 09/09/20 1516       PT SHORT TERM GOAL #1   Title Pt will be independent with intial HEP    Status Achieved      PT SHORT TERM GOAL #2   Title Pt will increase flexion in the scapular plane to 90 in order to improve overhead activity    Baseline 100 deg on 09/09/20    Status Achieved      PT SHORT TERM GOAL #3   Title Pt will show a decrease in R shoulder pain at rest to </= 3/10 in order to help with sleep.    Baseline 3/10 at rest on 09/09/20    Status Achieved               PT Long Term Goals - 10/05/20 1651       PT LONG TERM GOAL #1   Title Pt will be indepenent with final HEP    Baseline not given    Time 8    Period Weeks    Status Achieved      PT LONG TERM GOAL #2   Title  Pt will increase R shoulder strength to >/= 4/10 strength    Baseline not able to perform without compensations 10/05/20    Time 8    Period Weeks    Status On-going      PT LONG TERM GOAL #3   Title Pt will achieve WNL AROM into flexion, IR, and ER in order to promote increased R UE usage, overhead mobility, and help with dressing    Baseline flexion 75 degrees, ER 45 degrees and IR to stomach all in scapular plane and in PROM    Time 8    Period Weeks    Status On-going      PT LONG TERM GOAL #4   Title Pt will report </= 2/10 pain with movement to help promote L UE functional mobility.    Baseline No pain currently 10/05/20    Time 8    Period Weeks    Status Achieved      PT LONG TERM GOAL #5   Title Pt will be min assist with bed mobility and be able to use her R UE to help her with this to increase independence    Baseline mod-max assist from supine to/from sit and NWB on R UE    Time 8    Period Weeks    Status On-going                   Plan - 10/12/20 1632     Clinical Impression Statement Treatment continues to focus on improving shoulder flexion and elevation in standing against gravity without UT compensation. Progressed scapular strengthening to blue band. Needs reminders to maintain head/neck posture.    Personal Factors and Comorbidities Comorbidity 3+;Age;Social Background;Past/Current Experience;Time since onset of injury/illness/exacerbation    Comorbidities bilateral hip replacements, deafness, pacemaker, seizures    Examination-Activity Limitations Lift;Hygiene/Grooming;Dressing;Carry;Caring for Others;Bed Mobility;Bathing;Reach Overhead    Examination-Participation Restrictions Cleaning;Community Activity;Driving;Yard Work;Volunteer;Laundry;Shop;Meal Prep    Stability/Clinical Decision Making Evolving/Moderate complexity    Rehab Potential Good    PT Frequency 2x / week    PT Duration 8 weeks    PT Treatment/Interventions ADLs/Self Care Home  Management;Moist Heat;Cryotherapy;Manual techniques;Passive range of motion;Dry needling;Patient/family education;Therapeutic activities;Therapeutic exercise;Ultrasound;Taping;Balance training;Stair training;Functional mobility training;Scar mobilization;Compression bandaging;Gait training;Electrical Stimulation;Neuromuscular re-education;Joint Manipulations    PT Next Visit Plan Review HEP/update PRN. Encourage neck and shoulder posture. AAROM and strengthening as able in standing with less UT compensation    PT Home Exercise Plan [place] and Agree with Plan of Care Patient             Patient will benefit from skilled therapeutic intervention in order to improve the following deficits and impairments:  Pain, Impaired flexibility, Postural dysfunction, Decreased strength, Decreased mobility, Increased edema, Decreased balance, Decreased activity tolerance, Impaired UE functional use, Decreased range of motion, Increased fascial restricitons  Visit Diagnosis: Right shoulder pain, unspecified chronicity  Stiffness of right shoulder, not elsewhere classified  Muscle weakness (generalized)     Problem List Patient Active Problem List   Diagnosis Date Noted   Squamous cell carcinoma, 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   Hx of adenomatous colonic polyps 11/19/2016   Localization-related idiopathic epilepsy and epileptic syndromes with seizures of localized onset, not intractable, without status epilepticus ([place]) 01/01/2015   Drop attack 01/01/2015   Dizziness and giddiness 01/01/2015   Partial epilepsy with impairment of consciousness ([place]) 09/12/2013   Pacemaker-dual chamber medtronic 10/25/2012   Deaf 08/26/2012   Migraine 08/26/2012   Sinus node dysfunction ([place]) 08/26/2012   H/O bilateral hip replacements 08/26/2012    [name] [name] PT, DPT 10/12/2020, 4:34 PM  [facility] [address] [place], [place],  [address] Phone: [phone]   Fax:  [phone]  Name: [name] [name] MRN: [id number] Date of Birth: 23-Nov-1955

## 2020-10-13 ENCOUNTER — Telehealth: Payer: Self-pay | Admitting: *Deleted

## 2020-10-13 NOTE — Telephone Encounter (Signed)
AVS mailed to the patients home address as below.

## 2020-10-13 NOTE — Telephone Encounter (Signed)
-----   Message from [name], MD sent at 10/12/2020  6:32 PM EDT ----- I forgot to fill out patient instructions for her when she was here. She had been asking about constipation. Can you just print AVS for her and mail it to her? I included her lab results note on that too. Thanks!

## 2020-10-14 ENCOUNTER — Ambulatory Visit: Payer: Medicare Other | Admitting: Physical Therapy

## 2020-10-14 ENCOUNTER — Other Ambulatory Visit: Payer: Self-pay

## 2020-10-14 DIAGNOSIS — M6281 Muscle weakness (generalized): Secondary | ICD-10-CM | POA: Diagnosis not present

## 2020-10-14 DIAGNOSIS — M25611 Stiffness of right shoulder, not elsewhere classified: Secondary | ICD-10-CM

## 2020-10-14 DIAGNOSIS — M25511 Pain in right shoulder: Secondary | ICD-10-CM

## 2020-10-14 NOTE — Progress Notes (Signed)
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 To evaluate for objective findings of ongoing subclinical seizures, will check a 48 hour ambulatory EEG Following EEG, plan is to transition from oxcarbazepine to another anticonvulsant.  While there is room to increase oxcarbazepine, she already has low sodium level. Follow up 6 months.  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  She is accompanied by her daughter and an interpreter.   UPDATE: Current medications: Lamotrigine ER 300 mg twice daily, oxcarbazepine 300 mg in a.m. and 600 mg in p.m.   In March, she felt weak and had a syncopal spell, unresponsive for several minutes.  She then has had intermittent episodes of unresponsiveness.  She was brought to the ED where EEG showed mild diffuse slowing but no epileptiform discharges or seizures.  CT head personally reviewed was negative for acute findings.  Labs were unremarkable, with Na 130, which is baseline.    She has not had a syncopal episode/drop attack since March.  She has had a staring spell.  However, frequency is uncertain.  Her daughter only notices it once every few months.  The patient isn't sure but it appears that it may happen more frequently.  Labs from 6/10 showed lamotrigine level of 8 and 10-hydroxycarbazepine level of 17.8.     HISTORY: Two types: 1) Staring spells, not fully respond, sometimes aware, tenses up but no convulsions, lasts seconds to a minute.    2) Drop attacks, loses conscious and falls.  Sometimes able to hold on to something.  She has had recurrence of drop  attacks as well as dizzy spells for the past year.  It seems to have started following the death of her father.  The spells were well controlled up until that time.  She does endorse depression.  She has one spell a month, usually at the end of the month.     Due to recurrence of drop attacks, she had another EEG performed on 06/17/14, which was normal.  Due to dizziness, trough levels of her antiepileptic medications were checked to look for toxicity, which were normal.  Levels were normal.  once every few months -    She also has migraines once in awhile.   Prior medications:  She had side effects to Trileptal at 900mg .  Other medications tried, include Dilantin (ineffective), and immediate-release lamotrigine (ineffective).   Prior studies: EEG: focal slowing and frequent spike and wave activity in left anterior temporal lobe. MRI Brain (04/13/97): focal area of encephalomalacia in posterior left temporal lobe. MRI Brain (10/19/08): possible left mesial temporal sclerosis and lesion in left posterior temporal lobe.   She does not drive.   When she was [age] years old, she developed a virus following a case of the chicken pox. 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  PAST MEDICAL HISTORY: Past Medical History:  Diagnosis Date   Chicken pox    Conductive hearing loss, childhood onset    Deafness    from medication a a child for chicken pox   Epilepsy, followed by Dr. Tomma Rakers in neurology 08/26/2012  Hx of adenomatous colonic polyps 11/19/2016   Pacemaker    Pacemaker    2012   Seizure ([place])    Seizures ([place] )    Petite Mal     MEDICATIONS: Current Outpatient Medications on File Prior to Visit  Medication Sig Dispense Refill   amLODipine (NORVASC) 2.5 MG tablet Take 2.5 mg by mouth as needed.     carbamazepine (CARBATROL) 300 MG 12 hr capsule Take 1-2 capsules (300-600 mg total) by mouth See admin instructions. Take 1 capsule in the morning,  and 2 capsules in the evening. 60 capsule 2   Cholecalciferol (VITAMIN D) 2000 UNITS CAPS Take 2,000 Units by mouth 2 (two) times daily.     folic acid (FOLVITE) 1 MG tablet Take 1 mg by mouth daily.     LamoTRIgine 300 MG TB24 24 hour tablet Take 1 tablet by mouth twice daily 180 tablet 0   magnesium 30 MG tablet Take 30 mg by mouth daily.      Multiple Vitamin (MULTIVITAMIN WITH MINERALS) TABS tablet Take 2 tablets by mouth daily.     Oxcarbazepine (TRILEPTAL) 300 MG tablet Take 1 tablet (300 mg total) by mouth See admin instructions. Take one tablet by mouth in the morning and 2 in the evening. 90 tablet 2   oxyCODONE-acetaminophen (PERCOCET) 5-325 MG tablet Take 1-2 tablets every 4 hours as needed for post operative pain. MAX 6/day 30 tablet 0   tiZANidine (ZANAFLEX) 4 MG tablet Take 1 tablet (4 mg total) by mouth every 8 (eight) hours as needed for muscle spasms. 30 tablet 1   No current facility-administered medications on file prior to visit.    ALLERGIES: No Known Allergies  FAMILY HISTORY: Family History  Problem Relation Age of Onset   Diabetes Father    Hypertension Father    Colon cancer Father 62   Hypertension Mother    Other Mother        skin lesions   Diabetes Mother        may be borderline   Healthy Brother    Healthy Brother    Esophageal cancer Neg Hx    Rectal cancer Neg Hx    Stomach cancer Neg Hx       Objective:  Blood pressure (!) 143/88, pulse 72, height 5\' 3"  (1.6 m), weight 177 lb 12.8 oz (80.6 kg), last menstrual period 08/29/2005, SpO2 96 %. General: No acute distress.  Patient appears well-groomed.   Head:  Normocephalic/atraumatic Eyes:  Fundi examined but not visualized Neck: supple, no paraspinal tenderness, full range of motion Heart:  Regular rate and rhythm Lungs:  Clear to auscultation bilaterally Back: No paraspinal tenderness Neurological Exam: alert and oriented to person, place, and time. Attention span and concentration intact,  recent and remote memory intact, fund of knowledge intact.  Speech fluent and not dysarthric, language intact.  Deaf.  Otherwise, CN II-XII intact. Bulk and tone normal, muscle strength 5/5 throughout.  Sensation to light touch intact.  Deep tendon reflexes 2+ throughout, toes downgoing.  Finger to nose testing intact.  Gait normal, Romberg negative.     Metta Clines, DO  CC: Micheline Rough, MD

## 2020-10-14 NOTE — Therapy (Addendum)
[place], [place], [address] Phone: [phone]   Fax:  [phone]  Physical Therapy Treatment  Patient Details  Name: [name] [name] MRN: [id number] Date of Birth: 1955-09-12 Referring Provider (PT): Tania Ade, MD   Encounter Date: 10/14/2020   PT End of Session - 10/14/20 1626     Visit Number 13    Date for PT Re-Evaluation 10/21/20    Authorization Type UHC MCR/MCD    Authorization Time Period KX by 15th    PT Start Time 1550    PT Stop Time 1630    PT Time Calculation (min) 40 min    Activity Tolerance Patient tolerated treatment well;No increased pain    Behavior During Therapy [facility] for tasks assessed/performed             Past Medical History:  Diagnosis Date   Chicken pox    Conductive hearing loss, childhood onset    Deafness    from medication a a child for chicken pox   Epilepsy, followed by Dr. Tomma Rakers in neurology 08/26/2012   Hx of adenomatous colonic polyps 11/19/2016   Pacemaker    Pacemaker    2012   Seizure ([place])    Seizures ([name])    Petite Mal     Past Surgical History:  Procedure Laterality Date   HIP ARTHROPLASTY     PACEMAKER INSERTION  07/2010   TOTAL HIP ARTHROPLASTY  07/2011   right   TOTAL HIP ARTHROPLASTY  03/2012   left   TOTAL SHOULDER ARTHROPLASTY Right 07/15/2020   Procedure: TOTAL SHOULDER ARTHROPLASTY;  Surgeon: Tania Ade, MD;  Location: [facility];  Service: Orthopedics;  Laterality: Right;  NEED 120 MINUTES    There were no vitals filed for this visit.   Subjective Assessment - 10/14/20 1554     Subjective Pt has been doing exercises reports no issues.    Patient is accompained by: Interpreter   [name]   Pertinent History bilateral hip replacements, deafness, pacemaker, seizures    Limitations Lifting;House hold activities    Patient Stated Goals pain to be resolved, pick up heavy things to 25#, get object out of cabinet with right arm     Currently in Pain? No/denies    Pain Onset In the past 7 days                               [facility] Adult PT Treatment/Exercise - 10/14/20 0001       Shoulder Exercises: Supine   Horizontal ABduction Strengthening;Both;20 reps;Theraband    Theraband Level (Shoulder Horizontal ABduction) Level 2 (Red)    External Rotation Strengthening;Right;Theraband;20 reps    Theraband Level (Shoulder External Rotation) Level 2 (Red)    ABduction AROM;Strengthening;Right;20 reps      Shoulder Exercises: ROM/Strengthening   UBE (Upper Arm Bike) L1 x 2 min froward, 2 min back    Wall Wash flexion & abduction x10 each to fatigue      Shoulder Exercises: Stretch   Other Shoulder Stretches Hand behind stretch 2x30 sec for ER      Manual Therapy   Joint Mobilization Inferior GH grade III mobs and posterior grade III GH mobs    Passive ROM PROM shoulder abd                      PT Short Term Goals - 09/09/20 1516  PT SHORT TERM GOAL #1   Title Pt will be independent with intial HEP    Status Achieved      PT SHORT TERM GOAL #2   Title Pt will increase flexion in the scapular plane to 90 in order to improve overhead activity    Baseline 100 deg on 09/09/20    Status Achieved      PT SHORT TERM GOAL #3   Title Pt will show a decrease in R shoulder pain at rest to </= 3/10 in order to help with sleep.    Baseline 3/10 at rest on 09/09/20    Status Achieved               PT Long Term Goals - 10/14/20 1640       PT LONG TERM GOAL #1   Title Pt will be indepenent with final HEP    Baseline not given    Time 8    Period Weeks    Status Achieved      PT LONG TERM GOAL #2   Title Pt will increase R shoulder strength to >/= 4/10 strength    Baseline not able to perform without compensations 10/05/20    Time 8    Period Weeks    Status On-going      PT LONG TERM GOAL #3   Title Pt will achieve WNL AROM into flexion, IR, and ER in order to promote  increased R UE usage, overhead mobility, and help with dressing    Baseline AROM flexion and abd >100 deg in supine; ER to C7, IR to sacrum    Time 8    Period Weeks    Status On-going      PT LONG TERM GOAL #4   Title Pt will report </= 2/10 pain with movement to help promote L UE functional mobility.    Baseline No pain currently 10/05/20    Time 8    Period Weeks    Status Achieved      PT LONG TERM GOAL #5   Title Pt will be min assist with bed mobility and be able to use her R UE to help her with this to increase independence    Baseline mod-max assist from supine to/from sit and NWB on R UE    Time 8    Period Weeks    Status On-going                   Plan - 10/14/20 1623     Clinical Impression Statement Continuing to progress shoulder strengthening in supine and standing. Working on improving shoulder endurance. Pt continues to progress well; however, requires continued tactile cueing for improved shoulder/neck posture. Difficulty perform shoulder abd AROM >90 deg against gravity without compensation.    Personal Factors and Comorbidities Comorbidity 3+;Age;Social Background;Past/Current Experience;Time since onset of injury/illness/exacerbation    Comorbidities bilateral hip replacements, deafness, pacemaker, seizures    Examination-Activity Limitations Lift;Hygiene/Grooming;Dressing;Carry;Caring for Others;Bed Mobility;Bathing;Reach Overhead    Examination-Participation Restrictions Cleaning;Community Activity;Driving;Yard Work;Volunteer;Laundry;Shop;Meal Prep    Stability/Clinical Decision Making Evolving/Moderate complexity    Rehab Potential Good    PT Frequency 2x / week    PT Duration 8 weeks    PT Treatment/Interventions ADLs/Self Care Home Management;Moist Heat;Cryotherapy;Manual techniques;Passive range of motion;Dry needling;Patient/family education;Therapeutic activities;Therapeutic exercise;Ultrasound;Taping;Balance training;Stair training;Functional  mobility training;Scar mobilization;Compression bandaging;Gait training;Electrical Stimulation;Neuromuscular re-education;Joint Manipulations    PT Next Visit Plan Re-assess goals as POC is ending soon. Review HEP/update PRN. Encourage neck and  shoulder posture. AAROM and strengthening as able in standing with less UT compensation    PT Home Exercise Plan [place] and Agree with Plan of Care Patient             Patient will benefit from skilled therapeutic intervention in order to improve the following deficits and impairments:  Pain, Impaired flexibility, Postural dysfunction, Decreased strength, Decreased mobility, Increased edema, Decreased balance, Decreased activity tolerance, Impaired UE functional use, Decreased range of motion, Increased fascial restricitons  Visit Diagnosis: Right shoulder pain, unspecified chronicity  Stiffness of right shoulder, not elsewhere classified  Muscle weakness (generalized)     Problem List Patient Active Problem List   Diagnosis Date Noted   Squamous cell carcinoma, 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   Hx of adenomatous colonic polyps 11/19/2016   Localization-related idiopathic epilepsy and epileptic syndromes with seizures of localized onset, not intractable, without status epilepticus ([place]) 01/01/2015   Drop attack 01/01/2015   Dizziness and giddiness 01/01/2015   Partial epilepsy with impairment of consciousness ([place]) 09/12/2013   Pacemaker-dual chamber medtronic 10/25/2012   Deaf 08/26/2012   Migraine 08/26/2012   Sinus node dysfunction ([place]) 08/26/2012   H/O bilateral hip replacements 08/26/2012    Gates Jividen April Ma L Kristoffer Bala PT, DPT 10/14/2020, 4:42 PM  [facility] [address] [place], [place], [address] Phone: [phone]   Fax:  [phone]  Name: [name] [name] MRN: [id number] Date of Birth: 1956/04/20

## 2020-10-15 ENCOUNTER — Ambulatory Visit (INDEPENDENT_AMBULATORY_CARE_PROVIDER_SITE_OTHER): Payer: Medicare Other | Admitting: Neurology

## 2020-10-15 ENCOUNTER — Encounter: Payer: Self-pay | Admitting: Neurology

## 2020-10-15 ENCOUNTER — Other Ambulatory Visit: Payer: Self-pay | Admitting: Neurology

## 2020-10-15 VITALS — BP 143/88 | HR 72 | Ht 63.0 in | Wt 177.8 lb

## 2020-10-15 DIAGNOSIS — G40009 Localization-related (focal) (partial) idiopathic epilepsy and epileptic syndromes with seizures of localized onset, not intractable, without status epilepticus: Secondary | ICD-10-CM | POA: Diagnosis not present

## 2020-10-15 DIAGNOSIS — R55 Syncope and collapse: Secondary | ICD-10-CM

## 2020-10-15 NOTE — Patient Instructions (Signed)
Continue lamotrigine 300mg  twice daily and oxcarbazepine 300mg  in morning and 600mg  at night Will order a 48 hour ambulatory EEG Following the EEG, plan would be to switch the oxcarbazepine to another anti-seizure medication Follow up 6 months.

## 2020-10-18 ENCOUNTER — Other Ambulatory Visit: Payer: Self-pay | Admitting: Neurology

## 2020-10-19 ENCOUNTER — Ambulatory Visit: Payer: Medicare Other | Admitting: Physical Therapy

## 2020-10-21 ENCOUNTER — Encounter: Payer: Medicare Other | Admitting: Physical Therapy

## 2020-10-23 ENCOUNTER — Other Ambulatory Visit: Payer: Self-pay | Admitting: Neurology

## 2020-10-25 ENCOUNTER — Other Ambulatory Visit: Payer: Self-pay | Admitting: Neurology

## 2020-10-25 ENCOUNTER — Other Ambulatory Visit: Payer: Self-pay

## 2020-10-25 ENCOUNTER — Ambulatory Visit: Payer: Medicare Other

## 2020-10-25 DIAGNOSIS — M25611 Stiffness of right shoulder, not elsewhere classified: Secondary | ICD-10-CM

## 2020-10-25 DIAGNOSIS — M25511 Pain in right shoulder: Secondary | ICD-10-CM

## 2020-10-25 DIAGNOSIS — M6281 Muscle weakness (generalized): Secondary | ICD-10-CM

## 2020-10-25 NOTE — Therapy (Signed)
[place], [place], [address] Phone: [phone]   Fax:  [phone]  Physical Therapy Treatment/Re-certification/Discharge  Patient Details  Name: [name] [name] MRN: [id number] Date of Birth: Jun 12, 1955 Referring Provider (PT): Tania Ade, MD   Encounter Date: 10/25/2020   PT End of Session - 10/25/20 1327     Visit Number 14    Date for PT Re-Evaluation --    Authorization Type U[employer] MCR/MCD    Authorization Time Period KX by 15th    PT Start Time 1332    PT Stop Time 1418    PT Time Calculation (min) 46 min    Activity Tolerance Patient tolerated treatment well;No increased pain    Behavior During Therapy [facility] for tasks assessed/performed             Past Medical History:  Diagnosis Date   Chicken pox    Conductive hearing loss, childhood onset    Deafness    from medication a a child for chicken pox   Epilepsy, followed by Dr. Tomma Rakers in neurology 08/26/2012   Hx of adenomatous colonic polyps 11/19/2016   Pacemaker    Pacemaker    2012   Seizure ([place])    Seizures ([place])    Petite Mal     Past Surgical History:  Procedure Laterality Date   HIP ARTHROPLASTY     PACEMAKER INSERTION  07/2010   TOTAL HIP ARTHROPLASTY  07/2011   right   TOTAL HIP ARTHROPLASTY  03/2012   left   TOTAL SHOULDER ARTHROPLASTY Right 07/15/2020   Procedure: TOTAL SHOULDER ARTHROPLASTY;  Surgeon: Tania Ade, MD;  Location: [facility];  Service: Orthopedics;  Laterality: Right;  NEED 120 MINUTES    There were no vitals filed for this visit.   Subjective Assessment - 10/25/20 1332     Subjective Patient reports she is feeling ok. She reports she is moving around fine and feels that she is doing ok. She reports she can't lift anything over 25 lbs due to surgical restrictions, otherwise has no difficulty as it relates to her shoulder. She has f/u with surgeon in 3 months.    Patient is accompained by: Interpreter    Clyde Canterbury   Pertinent History bilateral hip replacements, deafness, pacemaker, seizures    Limitations Lifting    Patient Stated Goals pain to be resolved, pick up heavy things to 25#, get object out of cabinet with right arm    Currently in Pain? No/denies    Pain Onset --                [facility] PT Assessment - 10/25/20 0001       ROM / Strength   AROM / PROM / Strength Strength      AROM   Right Shoulder Flexion 150 Degrees    Right Shoulder ABduction 145 Degrees    Right Shoulder Internal Rotation 65 Degrees    Right Shoulder External Rotation 70 Degrees      Strength   Right Shoulder Flexion 4/5    Right Shoulder ABduction 4/5    Right Shoulder Internal Rotation 5/5    Right Shoulder External Rotation 5/5    Left Shoulder Flexion 4+/5    Left Shoulder ABduction 4+/5    Left Shoulder Internal Rotation 5/5    Left Shoulder External Rotation 5/5  [place] [facility] PT Treatment/Exercise - 10/25/20 0001       Self-Care   Other Self-Care Comments  see patient education      Shoulder Exercises: Supine   Horizontal ABduction 10 reps    Theraband Level (Shoulder Horizontal ABduction) Level 2 (Red)    Horizontal ABduction Limitations x2    Flexion 5 reps    Flexion Limitations AAROM with dowel      Shoulder Exercises: Sidelying   External Rotation 10 reps    External Rotation Weight (lbs) 2    External Rotation Limitations Rt x 2      Shoulder Exercises: Standing   Flexion 10 reps    Shoulder Flexion Weight (lbs) 2    Flexion Limitations partial range; Rt x 2    ABduction 10 reps    ABduction Limitations partial range Rt    Other Standing Exercises bicep curl red band 2 x 10 Rt    Other Standing Exercises shoulder flexion and abduction towel slides 1 x 5 each                    PT Education - 10/25/20 1420     Education Details Education on overall functional progress. D/C education. Updated HEP.    Person(s)  Educated Patient    Methods Explanation;Demonstration;Tactile cues;Verbal cues;Handout    Comprehension Verbalized understanding;Returned demonstration              PT Short Term Goals - 09/09/20 1516       PT SHORT TERM GOAL #1   Title Pt will be independent with intial HEP    Status Achieved      PT SHORT TERM GOAL #2   Title Pt will increase flexion in the scapular plane to 90 in order to improve overhead activity    Baseline 100 deg on 09/09/20    Status Achieved      PT SHORT TERM GOAL #3   Title Pt will show a decrease in R shoulder pain at rest to </= 3/10 in order to help with sleep.    Baseline 3/10 at rest on 09/09/20    Status Achieved               PT Long Term Goals - 10/25/20 1421       PT LONG TERM GOAL #1   Title Pt will be indepenent with final HEP    Baseline issued today    Time 8    Period Weeks    Status Achieved      PT LONG TERM GOAL #2   Title Pt will increase R shoulder strength to >/= 4/5 strength    Baseline see flowsheet    Time 8    Period Weeks    Status Achieved      PT LONG TERM GOAL #3   Title Pt will achieve WNL AROM into flexion, IR, and ER in order to promote increased R UE usage, overhead mobility, and help with dressing    Baseline see flowsheet for ROM measurements    Time 8    Period Weeks    Status Achieved      PT LONG TERM GOAL #4   Title Pt will report </= 2/10 pain with movement to help promote L UE functional mobility.    Baseline No pain    Time 8    Period Weeks    Status Achieved      PT LONG TERM GOAL #5   Title  Pt will be min assist with bed mobility and be able to use her R UE to help her with this to increase independence    Baseline min A    Time 8    Period Weeks    Status Achieved                   Plan - 10/25/20 1422     Clinical Impression Statement Patient arrives to PT without complaints of shoulder pain and reports no issues with daily activity as it relates to her  shoulder. She has met established functional goals demonstrating functional flexion and abduction AROM and normalized ER/IR AROM and good strength about the Rt shoulder. She is therefore appropriate for discharge to home program at this time.    Personal Factors and Comorbidities Comorbidity 3+;Age;Social Background;Past/Current Experience;Time since onset of injury/illness/exacerbation    Comorbidities bilateral hip replacements, deafness, pacemaker, seizures    Examination-Activity Limitations --    Examination-Participation Restrictions --    Stability/Clinical Decision Making --    Rehab Potential --    PT Frequency --    PT Duration --    PT Treatment/Interventions ADLs/Self Care Home Management;Moist Heat;Cryotherapy;Manual techniques;Passive range of motion;Dry needling;Patient/family education;Therapeutic activities;Therapeutic exercise;Ultrasound;Taping;Balance training;Stair training;Functional mobility training;Scar mobilization;Compression bandaging;Gait training;Electrical Stimulation;Neuromuscular re-education;Joint Manipulations    PT Next Visit Plan --    PT Home Exercise Plan [place] and Agree with Plan of Care Patient             Patient will benefit from skilled therapeutic intervention in order to improve the following deficits and impairments:  Pain, Impaired flexibility, Postural dysfunction, Decreased strength, Decreased mobility, Increased edema, Decreased balance, Decreased activity tolerance, Impaired UE functional use, Decreased range of motion, Increased fascial restricitons  Visit Diagnosis: Right shoulder pain, unspecified chronicity  Stiffness of right shoulder, not elsewhere classified  Muscle weakness (generalized)     Problem List Patient Active Problem List   Diagnosis Date Noted   Squamous cell carcinoma, 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   Hx of adenomatous colonic polyps 11/19/2016   Localization-related idiopathic epilepsy and epileptic  syndromes with seizures of localized onset, not intractable, without status epilepticus ([place]) 01/01/2015   Drop attack 01/01/2015   Dizziness and giddiness 01/01/2015   Partial epilepsy with impairment of consciousness ([place]) 09/12/2013   Pacemaker-dual chamber medtronic 10/25/2012   Deaf 08/26/2012   Migraine 08/26/2012   Sinus node dysfunction ([place]) 08/26/2012   H/O bilateral hip replacements 08/26/2012   PHYSICAL THERAPY DISCHARGE SUMMARY  Visits from Start of Care: 14  Current functional level related to goals / functional outcomes: Met all functional goals   Remaining deficits: N/A   Education / Equipment: See above    Patient agrees to discharge. Patient goals were met. Patient is being discharged due to meeting the stated rehab goals. Gwendolyn Grant, PT, DPT, ATC 10/25/20 3:58 PM  [facility] [address] [place], [place], [address] Phone: [phone]   Fax:  [phone]  Name: [name] [name] MRN: [id number] Date of Birth: 1955-07-14

## 2020-10-25 NOTE — Telephone Encounter (Signed)
Pt called and lm with AN for refill. [phone]

## 2020-10-26 ENCOUNTER — Encounter: Payer: Medicare Other | Admitting: Physical Therapy

## 2020-10-28 ENCOUNTER — Encounter: Payer: Medicare Other | Admitting: Physical Therapy

## 2020-10-31 ENCOUNTER — Other Ambulatory Visit: Payer: Self-pay | Admitting: Neurology

## 2020-11-02 ENCOUNTER — Encounter: Payer: Medicare Other | Admitting: Physical Therapy

## 2020-11-04 ENCOUNTER — Telehealth: Payer: Self-pay | Admitting: Family Medicine

## 2020-11-04 NOTE — Telephone Encounter (Signed)
Left message for patient to call back and schedule Medicare Annual Wellness Visit (AWV) either virtually or in office.   Last AWV 08/30/17  please schedule at anytime with [facility] Nurse Health Advisor 1 or 2   This should be a 45 minute visit.

## 2020-11-16 ENCOUNTER — Ambulatory Visit (INDEPENDENT_AMBULATORY_CARE_PROVIDER_SITE_OTHER): Payer: Medicare Other

## 2020-11-16 ENCOUNTER — Other Ambulatory Visit: Payer: Self-pay

## 2020-11-16 VITALS — BP 138/82 | HR 70 | Temp 98.3°F | Wt 178.5 lb

## 2020-11-16 DIAGNOSIS — Z0000 Encounter for general adult medical examination without abnormal findings: Secondary | ICD-10-CM | POA: Diagnosis not present

## 2020-11-16 NOTE — Patient Instructions (Signed)
[name] [name] , Thank you for taking time to come for your Medicare Wellness Visit. I appreciate your ongoing commitment to your health goals. Please review the following plan we discussed and let me know if I can assist you in the future.   Screening recommendations/referrals: Colonoscopy: Done 11/13/16 Mammogram: Done 12/19/18 Recommended yearly ophthalmology/optometry visit for glaucoma screening and checkup Recommended yearly dental visit for hygiene and checkup  Vaccinations: Influenza vaccine: Due 11/29/20 Pneumococcal vaccine: Due and discussed Tdap vaccine: Done 09/30/11 repeat in 10 years 09/29/21 Shingles vaccine: Completed 01/09/20 & 04/02/19  Covid-19: Pt will submit days   Advanced directives: Advance directive discussed with you today. I have provided a copy for you to complete at home and have notarized. Once this is complete please bring a copy in to our office so we can scan it into your chart.  Conditions/risks identified: None at this time  Next appointment: Follow up in one year for your annual wellness visit.  Preventive Care 40-64 Years, Female Preventive care refers to lifestyle choices and visits with your health care provider that can promote health and wellness. What does preventive care include? A yearly physical exam. This is also called an annual well check. Dental exams once or twice a year. Routine eye exams. Ask your health care provider how often you should have your eyes checked. Personal lifestyle choices, including: Daily care of your teeth and gums. Regular physical activity. Eating a healthy diet. Avoiding tobacco and drug use. Limiting alcohol use. Practicing safe sex. Taking low-dose aspirin daily starting at age 75. Taking vitamin and mineral supplements as recommended by your health care provider. What happens during an annual well check? The services and screenings done by your health care provider during your annual well check will depend on your  age, overall health, lifestyle risk factors, and family history of disease. Counseling  Your health care provider may ask you questions about your: Alcohol use. Tobacco use. Drug use. Emotional well-being. Home and relationship well-being. Sexual activity. Eating habits. Work and work Statistician. Method of birth control. Menstrual cycle. Pregnancy history. Screening  You may have the following tests or measurements: Height, weight, and BMI. Blood pressure. Lipid and cholesterol levels. These may be checked every 5 years, or more frequently if you are over 64 years old. Skin check. Lung cancer screening. You may have this screening every year starting at age 21 if you have a 30-pack-year history of smoking and currently smoke or have quit within the past 15 years. Fecal occult blood test (FOBT) of the stool. You may have this test every year starting at age 76. Flexible sigmoidoscopy or colonoscopy. You may have a sigmoidoscopy every 5 years or a colonoscopy every 10 years starting at age 48. Hepatitis C blood test. Hepatitis B blood test. Sexually transmitted disease (STD) testing. Diabetes screening. This is done by checking your blood sugar (glucose) after you have not eaten for a while (fasting). You may have this done every 1-3 years. Mammogram. This may be done every 1-2 years. Talk to your health care provider about when you should start having regular mammograms. This may depend on whether you have a family history of breast cancer. BRCA-related cancer screening. This may be done if you have a family history of breast, ovarian, tubal, or peritoneal cancers. Pelvic exam and Pap test. This may be done every 3 years starting at age 65. Starting at age 28, this may be done every 5 years if you have a Pap test  in combination with an HPV test. Bone density scan. This is done to screen for osteoporosis. You may have this scan if you are at high risk for osteoporosis. Discuss your test  results, treatment options, and if necessary, the need for more tests with your health care provider. Vaccines  Your health care provider may recommend certain vaccines, such as: Influenza vaccine. This is recommended every year. Tetanus, diphtheria, and acellular pertussis (Tdap, Td) vaccine. You may need a Td booster every 10 years. Zoster vaccine. You may need this after age 51. Pneumococcal 13-valent conjugate (PCV13) vaccine. You may need this if you have certain conditions and were not previously vaccinated. Pneumococcal polysaccharide (PPSV23) vaccine. You may need one or two doses if you smoke cigarettes or if you have certain conditions. Talk to your health care provider about which screenings and vaccines you need and how often you need them. This information is not intended to replace advice given to you by your health care provider. Make sure you discuss any questions you have with your health care provider. Document Released: 05/14/2015 Document Revised: 01/05/2016 Document Reviewed: 02/16/2015 Elsevier Interactive Patient Education  2017 [employer] Prevention in the Home Falls can cause injuries. They can happen to people of all ages. There are many things you can do to make your home safe and to help prevent falls. What can I do on the outside of my home? Regularly fix the edges of walkways and driveways and fix any cracks. Remove anything that might make you trip as you walk through a door, such as a raised step or threshold. Trim any bushes or trees on the path to your home. Use bright outdoor lighting. Clear any walking paths of anything that might make someone trip, such as rocks or tools. Regularly check to see if handrails are loose or broken. Make sure that both sides of any steps have handrails. Any raised decks and porches should have guardrails on the edges. Have any leaves, snow, or ice cleared regularly. Use sand or salt on walking paths during  winter. Clean up any spills in your garage right away. This includes oil or grease spills. What can I do in the bathroom? Use night lights. Install grab bars by the toilet and in the tub and shower. Do not use towel bars as grab bars. Use non-skid mats or decals in the tub or shower. If you need to sit down in the shower, use a plastic, non-slip stool. Keep the floor dry. Clean up any water that spills on the floor as soon as it happens. Remove soap buildup in the tub or shower regularly. Attach bath mats securely with double-sided non-slip rug tape. Do not have throw rugs and other things on the floor that can make you trip. What can I do in the bedroom? Use night lights. Make sure that you have a light by your bed that is easy to reach. Do not use any sheets or blankets that are too big for your bed. They should not hang down onto the floor. Have a firm chair that has side arms. You can use this for support while you get dressed. Do not have throw rugs and other things on the floor that can make you trip. What can I do in the kitchen? Clean up any spills right away. Avoid walking on wet floors. Keep items that you use a lot in easy-to-reach places. If you need to reach something above you, use a strong step  stool that has a grab bar. Keep electrical cords out of the way. Do not use floor polish or wax that makes floors slippery. If you must use wax, use non-skid floor wax. Do not have throw rugs and other things on the floor that can make you trip. What can I do with my stairs? Do not leave any items on the stairs. Make sure that there are handrails on both sides of the stairs and use them. Fix handrails that are broken or loose. Make sure that handrails are as long as the stairways. Check any carpeting to make sure that it is firmly attached to the stairs. Fix any carpet that is loose or worn. Avoid having throw rugs at the top or bottom of the stairs. If you do have throw rugs,  attach them to the floor with carpet tape. Make sure that you have a light switch at the top of the stairs and the bottom of the stairs. If you do not have them, ask someone to add them for you. What else can I do to help prevent falls? Wear shoes that: Do not have high heels. Have rubber bottoms. Are comfortable and fit you well. Are closed at the toe. Do not wear sandals. If you use a stepladder: Make sure that it is fully opened. Do not climb a closed stepladder. Make sure that both sides of the stepladder are locked into place. Ask someone to hold it for you, if possible. Clearly mark and make sure that you can see: Any grab bars or handrails. First and last steps. Where the edge of each step is. Use tools that help you move around (mobility aids) if they are needed. These include: Canes. Walkers. Scooters. Crutches. Turn on the lights when you go into a dark area. Replace any light bulbs as soon as they burn out. Set up your furniture so you have a clear path. Avoid moving your furniture around. If any of your floors are uneven, fix them. If there are any pets around you, be aware of where they are. Review your medicines with your doctor. Some medicines can make you feel dizzy. This can increase your chance of falling. Ask your doctor what other things that you can do to help prevent falls. This information is not intended to replace advice given to you by your health care provider. Make sure you discuss any questions you have with your health care provider. Document Released: 02/11/2009 Document Revised: 09/23/2015 Document Reviewed: 05/22/2014 Elsevier Interactive Patient Education  2017 [employer].

## 2020-11-16 NOTE — Progress Notes (Addendum)
Subjective:   Von Inscoe is a 65 y.o. female who presents for Medicare Annual (Subsequent) preventive examination.  Review of Systems     Cardiac Risk Factors include: obesity (BMI >30kg/m2)     Objective:    Today's Vitals   11/16/20 1434  BP: 138/82  Pulse: 70  Temp: 98.3 F (36.8 C)  SpO2: 97%  Weight: 178 lb 8 oz (81 kg)   Body mass index is 31.62 kg/m.  Advanced Directives 11/16/2020 08/26/2020 07/08/2020 02/24/2020 02/24/2019 08/30/2017 11/13/2016  Does Patient Have a Medical Advance Directive? No No No Yes No Yes No  Type of Advance Directive - - Public librarian;Out of facility DNR (pink MOST or yellow form);Living will - [place] in Chart? - - - - - No - copy requested -  Would patient like information on creating a medical advance directive? Yes (MAU/Ambulatory/Procedural Areas - Information given) No - Patient declined - - No - Patient declined - -    Current Medications (verified) Outpatient Encounter Medications as of 11/16/2020  Medication Sig   carbamazepine (CARBATROL) 300 MG 12 hr capsule Take 1-2 capsules (300-600 mg total) by mouth See admin instructions. Take 1 capsule in the morning, and 2 capsules in the evening.   Cholecalciferol (VITAMIN D) 2000 UNITS CAPS Take 2,000 Units by mouth 2 (two) times daily.   folic acid (FOLVITE) 1 MG tablet Take 1 mg by mouth daily.   LamoTRIgine 300 MG TB24 24 hour tablet Take 1 tablet by mouth twice daily   magnesium 30 MG tablet Take 30 mg by mouth daily.    Multiple Vitamin (MULTIVITAMIN WITH MINERALS) TABS tablet Take 2 tablets by mouth daily.   Oxcarbazepine (TRILEPTAL) 300 MG tablet TAKE 1 TABLET BY MOUTH ONCE DAILY IN THE MORNING AND 2 IN THE EVENING   tiZANidine (ZANAFLEX) 4 MG tablet Take 1 tablet (4 mg total) by mouth every 8 (eight) hours as needed for muscle spasms.   vitamin C (ASCORBIC ACID) 500 MG tablet Take 500 mg by mouth daily.    amLODipine (NORVASC) 2.5 MG tablet Take 2.5 mg by mouth as needed. (Patient not taking: Reported on 11/16/2020)   [DISCONTINUED] oxyCODONE-acetaminophen (PERCOCET) 5-325 MG tablet Take 1-2 tablets every 4 hours as needed for post operative pain. MAX 6/day (Patient not taking: No sig reported)   No facility-administered encounter medications on file as of 11/16/2020.    Allergies (verified) Patient has no known allergies.   History: Past Medical History:  Diagnosis Date   Chicken pox    Conductive hearing loss, childhood onset    Deafness    from medication a a child for chicken pox   Epilepsy, followed by Dr. Tomma Rakers in neurology 08/26/2012   Hx of adenomatous colonic polyps 11/19/2016   Pacemaker    Pacemaker    2012   Seizure ([place])    Seizures ([place])    Petite Mal    Past Surgical History:  Procedure Laterality Date   HIP ARTHROPLASTY     PACEMAKER INSERTION  07/2010   TOTAL HIP ARTHROPLASTY  07/2011   right   TOTAL HIP ARTHROPLASTY  03/2012   left   TOTAL SHOULDER ARTHROPLASTY Right 07/15/2020   Procedure: TOTAL SHOULDER ARTHROPLASTY;  Surgeon: Tania Ade, MD;  Location: [facility];  Service: Orthopedics;  Laterality: Right;  NEED 120 MINUTES   Family History  Problem Relation Age of Onset   Diabetes Father  Hypertension Father    Colon cancer Father 40   Hypertension Mother    Other Mother        skin lesions   Diabetes Mother        may be borderline   Healthy Brother    Healthy Brother    Esophageal cancer Neg Hx    Rectal cancer Neg Hx    Stomach cancer Neg Hx    Social History   Socioeconomic History   Marital status: Divorced    Spouse name: Not on file   Number of children: 2   Years of education: Not on file   Highest education level: High school graduate  Occupational History   Not on file  Tobacco Use   Smoking status: Never   Smokeless tobacco: Never  Vaping Use   Vaping Use: Never used  Substance and Sexual Activity   Alcohol use: No     Alcohol/week: 0.0 standard drinks    Comment: none   Drug use: No   Sexual activity: Never  Other Topics Concern   Not on file  Social History Narrative   ** Merged History Encounter **       Work or School: on disability for the deafness, seizure and carpal tunnel      Home Situation: lives with mother and father currently - since 06/2012      Spiritual Beliefs: Christian      Lifestyle: walks twice daily      Right handed      Social Determinants of Health   Financial Resource Strain: Low Risk    Difficulty of Paying Living Expenses: Not hard at all  Food Insecurity: No Food Insecurity   Worried About Charity fundraiser in the Last Year: Never true   [place] in the Last Year: Never true  Transportation Needs: No Transportation Needs   Lack of Transportation (Medical): No   Lack of Transportation (Non-Medical): No  Physical Activity: Sufficiently Active   Days of Exercise per Week: 7 days   Minutes of Exercise per Session: 60 min  Stress: No Stress Concern Present   Feeling of Stress : Not at all  Social Connections: Socially Isolated   Frequency of Communication with Friends and Family: Once a week   Frequency of Social Gatherings with Friends and Family: Never   Attends Religious Services: Never   Marine scientist or Organizations: No   Attends Music therapist: Never   Marital Status: Divorced    Tobacco Counseling Counseling given: Not Answered   Clinical Intake:  Pre-visit preparation completed: Yes  Pain : No/denies pain     BMI - recorded: 31.62 Nutritional Status: BMI > 30  Obese Nutritional Risks: None Diabetes: No  How often do you need to have someone help you when you read instructions, pamphlets, or other written materials from your doctor or pharmacy?: 1 - Never  Diabetic?No  Interpreter Needed?: Yes Interpreter Name: Macy Mis Patient Declined Interpreter : No Patient signed [place] waiver:  Yes  Information entered by :: Charlott Rakes, LPN   Activities of Daily Living In your present state of health, do you have any difficulty performing the following activities: 11/16/2020 07/08/2020  Hearing? Y Y  Comment deaf -  Vision? N N  Difficulty concentrating or making decisions? Y N  Comment sometimes decisions -  Walking or climbing stairs? N N  Dressing or bathing? N N  Doing errands, shopping? N N  Preparing  Food and eating ? N -  Using the Toilet? N -  In the past six months, have you accidently leaked urine? N -  Do you have problems with loss of bowel control? N -  Managing your Medications? N -  Managing your Finances? N -  Housekeeping or managing your Housekeeping? N -  Some recent data might be hidden    Patient Care Team: [name], MD as PCP - General (Family Medicine) Aloha Gell, MD as Consulting Physician (Obstetrics and Gynecology) Pieter Partridge, DO as Consulting Physician (Neurology)  Indicate any recent Medical Services you may have received from other than [facility] providers in the past year (date may be approximate).     Assessment:   This is a routine wellness examination for Leeba.  Hearing/Vision screen Hearing Screening - Comments:: deaf Vision Screening - Comments:: Pt follows up bi- annually with provider   Dietary issues and exercise activities discussed: Current Exercise Habits: Home exercise routine, Type of exercise: walking, Time (Minutes): 60, Frequency (Times/Week): 7, Weekly Exercise (Minutes/Week): 420   Goals Addressed             This Visit's Progress    Patient Stated       None at this time       Depression Screen PHQ 2/9 Scores 11/16/2020 01/09/2020 01/08/2019 08/30/2017 08/20/2017 08/14/2016  PHQ - 2 Score 1 0 0 0 0 0    Fall Risk Fall Risk  11/16/2020 02/24/2020 02/24/2019 01/08/2019 05/27/2018  Falls in the past year? 1 0 1 0 0  Number falls in past yr: 1 0 0 0 -  Injury with Fall? 0 0 0 0 -  Risk for fall  due to : Impaired vision;Impaired balance/gait;Impaired mobility - - - -  Follow up Falls prevention discussed - - - Falls evaluation completed    FALL RISK PREVENTION PERTAINING TO THE HOME:  Any stairs in or around the home? Yes  If so, are there any without handrails? No  Home free of loose throw rugs in walkways, pet beds, electrical cords, etc? Yes  Adequate lighting in your home to reduce risk of falls? Yes   ASSISTIVE DEVICES UTILIZED TO PREVENT FALLS:  Life alert? No  Use of a cane, walker or w/c? No  Grab bars in the bathroom? No  Shower chair or bench in shower? Yes  Elevated toilet seat or a handicapped toilet? No   TIMED UP AND GO:  Was the test performed? Yes .  Length of time to ambulate 10 feet: 10 sec.   Gait steady and fast without use of assistive device  Cognitive Function:     6CIT Screen 11/16/2020  What Year? 0 points  What month? 0 points  What time? 0 points  Count back from 20 0 points  Months in reverse 0 points  Repeat phrase 0 points  Total Score 0    Immunizations Immunization History  Administered Date(s) Administered   Influenza Inj Mdck Quad Pf 01/31/2018   Influenza Split 01/29/2013, 01/20/2014   Influenza,inj,Quad PF,6+ Mos 02/04/2015, 01/19/2016, 01/15/2017, 01/02/2019, 01/09/2020   Influenza-Unspecified 01/29/2018   Td 09/30/2011   Zoster Recombinat (Shingrix) 04/02/2019, 01/09/2020    TDAP status: Up to date  Flu Vaccine status: Up to date  Pneumococcal vaccine status: Due, Education has been provided regarding the importance of this vaccine. Advised may receive this vaccine at local pharmacy or Health Dept. Aware to provide a copy of the vaccination record if obtained from local  pharmacy or Health Dept. Verbalized acceptance and understanding.  Covid-19 vaccine status: Completed vaccines  Qualifies for Shingles Vaccine? Yes   Zostavax completed Yes   Shingrix Completed?: Yes  Screening Tests Health Maintenance  Topic  Date Due   COVID-19 Vaccine (1) Never done   Pneumococcal Vaccine 40-17 Years old (1 - PCV) Never done   PAP SMEAR-Modifier  08/20/2020   HIV Screening  10/08/2021 (Originally 04/09/1971)   INFLUENZA VACCINE  11/29/2020   MAMMOGRAM  12/18/2020   TETANUS/TDAP  09/29/2021   COLONOSCOPY (Pts 45-38yrs Insurance coverage will need to be confirmed)  11/13/2021   Hepatitis C Screening  Completed   Zoster Vaccines- Shingrix  Completed   HPV VACCINES  Aged Out    Health Maintenance  Health Maintenance Due  Topic Date Due   COVID-19 Vaccine (1) Never done   Pneumococcal Vaccine 42-76 Years old (1 - PCV) Never done   PAP SMEAR-Modifier  08/20/2020    Colorectal cancer screening: Type of screening: Colonoscopy. Completed 11/13/16. Repeat every 5 years  Mammogram status: Completed 12/19/18. Repeat every year    Lung Cancer Screening: (Low Dose CT Chest recommended if Age 65-80 years, 30 pack-year currently smoking OR have quit w/in 15years.)   Additional Screening:  Hepatitis C Screening:  Completed 08/20/17  Vision Screening: Recommended annual ophthalmology exams for early detection of glaucoma and other disorders of the eye. Is the patient up to date with their annual eye exam?  Yes  Who is the provider or what is the name of the office in which the patient attends annual eye exams? Unsure  of provider names If pt is not established with a provider, would they like to be referred to a provider to establish care? No .   Dental Screening: Recommended annual dental exams for proper oral hygiene  Community Resource Referral / Chronic Care Management: CRR required this visit?  No   CCM required this visit?  No      Plan:     I have personally reviewed and noted the following in the patient's chart:   Medical and social history Use of alcohol, tobacco or illicit drugs  Current medications and supplements including opioid prescriptions.  Functional ability and status Nutritional  status Physical activity Advanced directives List of other physicians Hospitalizations, surgeries, and ER visits in previous 12 months Vitals Screenings to include cognitive, depression, and falls Referrals and appointments  In addition, I have reviewed and discussed with patient certain preventive protocols, quality metrics, and best practice recommendations. A written personalized care plan for preventive services as well as general preventive health recommendations were provided to patient.     Willette Brace, LPN   [id number]/41/[id number]   Nurse Notes: None

## 2020-11-23 ENCOUNTER — Ambulatory Visit (INDEPENDENT_AMBULATORY_CARE_PROVIDER_SITE_OTHER): Payer: Medicare Other

## 2020-11-23 DIAGNOSIS — R001 Bradycardia, unspecified: Secondary | ICD-10-CM | POA: Diagnosis not present

## 2020-11-24 LAB — CUP PACEART REMOTE DEVICE CHECK
Battery Impedance: 1117 Ohm
Battery Remaining Longevity: 68 mo
Battery Voltage: 2.76 V
Brady Statistic AP VP Percent: 0 %
Brady Statistic AP VS Percent: 0 %
Brady Statistic AS VP Percent: 0 %
Brady Statistic AS VS Percent: 100 %
Date Time Interrogation Session: 20220726143902
Implantable Lead Implant Date: 20120409
Implantable Lead Implant Date: 20120409
Implantable Lead Location: 753859
Implantable Lead Location: 753860
Implantable Lead Model: 4076
Implantable Lead Model: 4076
Implantable Pulse Generator Implant Date: 20120409
Lead Channel Impedance Value: 546 Ohm
Lead Channel Impedance Value: 649 Ohm
Lead Channel Pacing Threshold Amplitude: 0.5 V
Lead Channel Pacing Threshold Amplitude: 0.625 V
Lead Channel Pacing Threshold Pulse Width: 0.4 ms
Lead Channel Pacing Threshold Pulse Width: 0.4 ms
Lead Channel Setting Pacing Amplitude: 2 V
Lead Channel Setting Pacing Amplitude: 2.5 V
Lead Channel Setting Pacing Pulse Width: 0.4 ms
Lead Channel Setting Sensing Sensitivity: 5.6 mV

## 2020-12-06 ENCOUNTER — Other Ambulatory Visit (INDEPENDENT_AMBULATORY_CARE_PROVIDER_SITE_OTHER): Payer: Medicare Other

## 2020-12-06 ENCOUNTER — Other Ambulatory Visit: Payer: Self-pay

## 2020-12-06 DIAGNOSIS — R7989 Other specified abnormal findings of blood chemistry: Secondary | ICD-10-CM | POA: Diagnosis not present

## 2020-12-06 DIAGNOSIS — I10 Essential (primary) hypertension: Secondary | ICD-10-CM

## 2020-12-06 DIAGNOSIS — R739 Hyperglycemia, unspecified: Secondary | ICD-10-CM | POA: Diagnosis not present

## 2020-12-06 LAB — CBC WITH DIFFERENTIAL/PLATELET
Basophils Absolute: 0 10*3/uL (ref 0.0–0.1)
Basophils Relative: 0.9 % (ref 0.0–3.0)
Eosinophils Absolute: 0 10*3/uL (ref 0.0–0.7)
Eosinophils Relative: 0.8 % (ref 0.0–5.0)
HCT: 38.6 % (ref 36.0–46.0)
Hemoglobin: 12.8 g/dL (ref 12.0–15.0)
Lymphocytes Relative: 42.9 % (ref 12.0–46.0)
Lymphs Abs: 1.8 10*3/uL (ref 0.7–4.0)
MCHC: 33.2 g/dL (ref 30.0–36.0)
MCV: 90.9 fl (ref 78.0–100.0)
Monocytes Absolute: 0.5 10*3/uL (ref 0.1–1.0)
Monocytes Relative: 11.1 % (ref 3.0–12.0)
Neutro Abs: 1.9 10*3/uL (ref 1.4–7.7)
Neutrophils Relative %: 44.3 % (ref 43.0–77.0)
Platelets: 235 10*3/uL (ref 150.0–400.0)
RBC: 4.25 Mil/uL (ref 3.87–5.11)
RDW: 15 % (ref 11.5–15.5)
WBC: 4.2 10*3/uL (ref 4.0–10.5)

## 2020-12-06 LAB — COMPREHENSIVE METABOLIC PANEL
ALT: 16 U/L (ref 0–35)
AST: 23 U/L (ref 0–37)
Albumin: 4.4 g/dL (ref 3.5–5.2)
Alkaline Phosphatase: 92 U/L (ref 39–117)
BUN: 8 mg/dL (ref 6–23)
CO2: 27 mEq/L (ref 19–32)
Calcium: 9.5 mg/dL (ref 8.4–10.5)
Chloride: 94 mEq/L — ABNORMAL LOW (ref 96–112)
Creatinine, Ser: 0.73 mg/dL (ref 0.40–1.20)
GFR: 86.76 mL/min (ref >60.00)
Glucose, Bld: 83 mg/dL (ref 70–99)
Potassium: 4.5 mEq/L (ref 3.5–5.1)
Sodium: 130 mEq/L — ABNORMAL LOW (ref 135–145)
Total Bilirubin: 0.4 mg/dL (ref 0.2–1.2)
Total Protein: 7.4 g/dL (ref 6.0–8.3)

## 2020-12-06 LAB — GAMMA GT: GGT: 51 U/L (ref 7–51)

## 2020-12-06 LAB — HEMOGLOBIN A1C: Hgb A1c MFr Bld: 5.5 % (ref 4.6–6.5)

## 2020-12-15 ENCOUNTER — Telehealth: Payer: Self-pay | Admitting: *Deleted

## 2020-12-15 ENCOUNTER — Ambulatory Visit: Payer: Medicare Other | Admitting: Family Medicine

## 2020-12-15 NOTE — Progress Notes (Deleted)
  Justyn Leupold DOB: Nov 08, 1955 Encounter date: 12/15/2020  This is a 65 y.o. female who presents with No chief complaint on file.   History of present illness:  HPI   No Known Allergies No outpatient medications have been marked as taking for the 12/15/20 encounter (Appointment) with [name], MD.    Review of Systems  Objective:  LMP 08/29/2005       BP Readings from Last 3 Encounters:  11/16/20 138/82  10/15/20 (!) 143/88  10/08/20 122/80   Wt Readings from Last 3 Encounters:  11/16/20 178 lb 8 oz (81 kg)  10/15/20 177 lb 12.8 oz (80.6 kg)  10/08/20 179 lb 12.8 oz (81.6 kg)    Physical Exam  Assessment/Plan  There are no diagnoses linked to this encounter.       Micheline Rough, MD

## 2020-12-15 NOTE — Telephone Encounter (Signed)
-----   Message from [name], MD sent at 12/15/2020  1:24 PM EDT ----- Not sure why patient on schedule today. She isn't really due back until December. I just saw her in June and wanted juts a 6 mo followup. She just had bloodwork done but it was all normal. Happy to see her if she has concerns, but last time she was here she complained of too frequent visits/bloodwork so wanted to reach out ahead of time to allow her to just reshcedule to december

## 2020-12-15 NOTE — Telephone Encounter (Signed)
Spoke with the patients mother and informed her of the message below.  She stated she is not sure why the patient was coming in either, agreed to await the appt on December 14th for the physical exam and is aware the appt was cancelled for today.

## 2020-12-17 NOTE — Progress Notes (Signed)
Remote pacemaker transmission.   

## 2020-12-27 DIAGNOSIS — M79605 Pain in left leg: Secondary | ICD-10-CM | POA: Diagnosis not present

## 2021-01-08 ENCOUNTER — Other Ambulatory Visit: Payer: Self-pay | Admitting: Neurology

## 2021-01-27 ENCOUNTER — Ambulatory Visit: Payer: Medicare Other | Admitting: Neurology

## 2021-02-08 DIAGNOSIS — H52223 Regular astigmatism, bilateral: Secondary | ICD-10-CM | POA: Diagnosis not present

## 2021-02-08 DIAGNOSIS — H31003 Unspecified chorioretinal scars, bilateral: Secondary | ICD-10-CM | POA: Diagnosis not present

## 2021-02-08 DIAGNOSIS — H5213 Myopia, bilateral: Secondary | ICD-10-CM | POA: Diagnosis not present

## 2021-02-08 DIAGNOSIS — H524 Presbyopia: Secondary | ICD-10-CM | POA: Diagnosis not present

## 2021-02-22 ENCOUNTER — Ambulatory Visit (INDEPENDENT_AMBULATORY_CARE_PROVIDER_SITE_OTHER): Payer: Medicare Other

## 2021-02-22 DIAGNOSIS — I495 Sick sinus syndrome: Secondary | ICD-10-CM

## 2021-02-22 LAB — CUP PACEART REMOTE DEVICE CHECK
Battery Impedance: 1223 Ohm
Battery Remaining Longevity: 64 mo
Battery Voltage: 2.76 V
Brady Statistic AP VP Percent: 0 %
Brady Statistic AP VS Percent: 0 %
Brady Statistic AS VP Percent: 0 %
Brady Statistic AS VS Percent: 100 %
Date Time Interrogation Session: 20221025160019
Implantable Lead Implant Date: 20120409
Implantable Lead Implant Date: 20120409
Implantable Lead Location: 753859
Implantable Lead Location: 753860
Implantable Lead Model: 4076
Implantable Lead Model: 4076
Implantable Pulse Generator Implant Date: 20120409
Lead Channel Impedance Value: 545 Ohm
Lead Channel Impedance Value: 614 Ohm
Lead Channel Pacing Threshold Amplitude: 0.5 V
Lead Channel Pacing Threshold Amplitude: 0.625 V
Lead Channel Pacing Threshold Pulse Width: 0.4 ms
Lead Channel Pacing Threshold Pulse Width: 0.4 ms
Lead Channel Setting Pacing Amplitude: 2 V
Lead Channel Setting Pacing Amplitude: 2.5 V
Lead Channel Setting Pacing Pulse Width: 0.4 ms
Lead Channel Setting Sensing Sensitivity: 5.6 mV

## 2021-03-01 ENCOUNTER — Ambulatory Visit: Payer: Medicare Other | Admitting: Neurology

## 2021-03-02 NOTE — Progress Notes (Signed)
Remote pacemaker transmission.   

## 2021-04-13 ENCOUNTER — Encounter: Payer: Self-pay | Admitting: Family Medicine

## 2021-04-13 ENCOUNTER — Ambulatory Visit (INDEPENDENT_AMBULATORY_CARE_PROVIDER_SITE_OTHER): Payer: Medicare Other | Admitting: Family Medicine

## 2021-04-13 VITALS — BP 120/88 | HR 70 | Temp 97.7°F | Ht 63.0 in | Wt 181.8 lb

## 2021-04-13 DIAGNOSIS — R2689 Other abnormalities of gait and mobility: Secondary | ICD-10-CM

## 2021-04-13 DIAGNOSIS — E871 Hypo-osmolality and hyponatremia: Secondary | ICD-10-CM | POA: Diagnosis not present

## 2021-04-13 DIAGNOSIS — Z23 Encounter for immunization: Secondary | ICD-10-CM

## 2021-04-13 DIAGNOSIS — R739 Hyperglycemia, unspecified: Secondary | ICD-10-CM | POA: Diagnosis not present

## 2021-04-13 DIAGNOSIS — I10 Essential (primary) hypertension: Secondary | ICD-10-CM | POA: Diagnosis not present

## 2021-04-13 DIAGNOSIS — Z0000 Encounter for general adult medical examination without abnormal findings: Secondary | ICD-10-CM | POA: Diagnosis not present

## 2021-04-13 DIAGNOSIS — M25512 Pain in left shoulder: Secondary | ICD-10-CM

## 2021-04-13 DIAGNOSIS — G40209 Localization-related (focal) (partial) symptomatic epilepsy and epileptic syndromes with complex partial seizures, not intractable, without status epilepticus: Secondary | ICD-10-CM

## 2021-04-13 NOTE — Progress Notes (Signed)
[name] [name] DOB: [birth year] Encounter date: 04/13/2021  This is a 65 y.o. female who presents for complete physical   History of present illness/Additional concerns: ASL interpreter was used for entire office visit.  Last visit with me was 10/08/20.   Been feeling frustrated with things going on with health. Feels that balance is off; goes to get up to do something and just feels off balance. Not sure when it started. No dizziness, spinning. Just has fear of falling as well. Right before fall; felt like something going on in sinuses/eyes watering. Felt a little sick around that time too. No ear pain/pressure. Had headache on Monday. Took mucinex on Monday which helped.   Sometimes feels not depressed but feels confused, but it goes away. Like she can't remember what she was going to do, but usually comes to her.   Lately was helping with christmas decorations and did something to left shoulder. After doing decorating started feeling yucky. Doesn't really feel better; still popping.   She was telling mom about balance issue and feeling like she might fall and her brother encouraged her to walk outside. Did this on the 9th, but she fell outside. Was like she was in a dream. Brother didn't witness fall. Brother states that she startled when he went to touch her. Since February she hasn't had the "dream like episodes".   She did fall on right side and hurt this. Started to feel better past Monday. Can still tell pain is there. Used heat which helped. Pain is along upper right side ribs.  Yesterday while eating started to feel sick to stomach like she was going to throw up. Brother suggested it could be acid. Was in the morning when it happened. Took maalox which helped.   HTN: no longer taking the amlodipine. Didn't recall taking this for blood pressure and has since run out/off med list.   Past Medical History:  Diagnosis Date   Chicken pox    Conductive hearing loss, childhood onset     Deafness    from medication a a child for chicken pox   Epilepsy, followed by Dr. Tomma Rakers in neurology 08/26/2012   Hx of adenomatous colonic polyps 11/19/2016   Pacemaker    Pacemaker    2012   Seizure ([place])    Seizures ([place])    Petite Mal    Past Surgical History:  Procedure Laterality Date   HIP ARTHROPLASTY     PACEMAKER INSERTION  07/2010   TOTAL HIP ARTHROPLASTY  07/2011   right   TOTAL HIP ARTHROPLASTY  03/2012   left   TOTAL SHOULDER ARTHROPLASTY Right 07/15/2020   Procedure: TOTAL SHOULDER ARTHROPLASTY;  Surgeon: Tania Ade, MD;  Location: [facility];  Service: Orthopedics;  Laterality: Right;  NEED 120 MINUTES   No Known Allergies Current Meds  Medication Sig   amLODipine (NORVASC) 2.5 MG tablet Take 2.5 mg by mouth as needed.   carbamazepine (CARBATROL) 300 MG 12 hr capsule Take 1-2 capsules (300-600 mg total) by mouth See admin instructions. Take 1 capsule in the morning, and 2 capsules in the evening.   Cholecalciferol (VITAMIN D) 2000 UNITS CAPS Take 2,000 Units by mouth 2 (two) times daily.   folic acid (FOLVITE) 1 MG tablet Take 1 mg by mouth daily.   LamoTRIgine 300 MG TB24 24 hour tablet Take 1 tablet by mouth twice daily   magnesium 30 MG tablet Take 30 mg by mouth daily.    Multiple Vitamin (MULTIVITAMIN WITH MINERALS) TABS  tablet Take 2 tablets by mouth daily.   Oxcarbazepine (TRILEPTAL) 300 MG tablet TAKE 1 TABLET BY MOUTH ONCE DAILY IN THE MORNING AND 2 IN THE EVENING   tiZANidine (ZANAFLEX) 4 MG tablet Take 1 tablet (4 mg total) by mouth every 8 (eight) hours as needed for muscle spasms.   vitamin C (ASCORBIC ACID) 500 MG tablet Take 500 mg by mouth daily.   Social History   Tobacco Use   Smoking status: Never   Smokeless tobacco: Never  Substance Use Topics   Alcohol use: No    Alcohol/week: 0.0 standard drinks    Comment: none   Family History  Problem Relation Age of Onset   Diabetes Father    Hypertension Father    Colon cancer Father 58    Hypertension Mother    Other Mother        skin lesions   Diabetes Mother        may be borderline   Healthy Brother    Healthy Brother    Esophageal cancer Neg Hx    Rectal cancer Neg Hx    Stomach cancer Neg Hx      Review of Systems  Constitutional:  Negative for activity change, appetite change, chills, fatigue, fever and unexpected weight change.  HENT:  Negative for congestion, ear pain, hearing loss, sinus pressure, sinus pain, sore throat and trouble swallowing.   Eyes:  Negative for pain and visual disturbance.  Respiratory:  Negative for cough, chest tightness, shortness of breath and wheezing.   Cardiovascular:  Negative for chest pain, palpitations and leg swelling.  Gastrointestinal:  Negative for abdominal pain, blood in stool, constipation, diarrhea, nausea and vomiting.  Genitourinary:  Negative for difficulty urinating and menstrual problem.  Musculoskeletal:  Positive for arthralgias (left shoulder, bilat knees) and gait problem. Negative for back pain.  Skin:  Negative for rash.  Neurological:  Negative for dizziness, weakness, numbness and headaches.       More balance; fear of falling. Not dizziness or spinning sensation. Note noted with changes in position. Just worries when walking.   Hematological:  Negative for adenopathy. Does not bruise/bleed easily.  Psychiatric/Behavioral:  Negative for sleep disturbance and suicidal ideas. The patient is not nervous/anxious.    CBC:  Lab Results  Component Value Date   WBC 4.8 04/13/2021   HGB 12.8 04/13/2021   HCT 38.0 04/13/2021   MCH 31.2 07/17/2020   MCHC 33.8 04/13/2021   RDW 14.4 04/13/2021   PLT 267.0 04/13/2021   MPV 9.6 01/09/2020   CMP: Lab Results  Component Value Date   NA 129 (L) 04/13/2021   K 4.3 04/13/2021   CL 92 (L) 04/13/2021   CO2 29 04/13/2021   ANIONGAP 7 07/17/2020   GLUCOSE 89 04/13/2021   BUN 9 04/13/2021   CREATININE 0.79 04/13/2021   CREATININE 0.85 01/09/2020   GFRAA >90  03/29/2014   GFRAA 82 09/12/2013   CALCIUM 9.6 04/13/2021   PROT 7.8 04/13/2021   BILITOT 0.4 04/13/2021   ALKPHOS 101 04/13/2021   ALT 20 04/13/2021   AST 26 04/13/2021   LIPID: Lab Results  Component Value Date   CHOL 210 (H) 10/08/2020   TRIG 46 10/08/2020   HDL 81 10/08/2020   LDLCALC 115 (H) 10/08/2020    Objective:  BP 120/88 (BP Location: Left Arm, Patient Position: Sitting, Cuff Size: Large)    Pulse 70    Temp 97.7 F (36.5 C) (Oral)    Ht 5\' 3"  (  1.6 m)    Wt 181 lb 12.8 oz (82.5 kg)    LMP 08/29/2005    SpO2 99%    BMI 32.20 kg/m   Weight: 181 lb 12.8 oz (82.5 kg)   BP Readings from Last 3 Encounters:  04/13/21 120/88  11/16/20 138/82  10/15/20 (!) 143/88   Wt Readings from Last 3 Encounters:  04/13/21 181 lb 12.8 oz (82.5 kg)  11/16/20 178 lb 8 oz (81 kg)  10/15/20 177 lb 12.8 oz (80.6 kg)    Physical Exam Constitutional:      General: She is not in acute distress.    Appearance: She is well-developed.  HENT:     Head: Normocephalic and atraumatic.     Right Ear: External ear normal.     Left Ear: External ear normal.     Mouth/Throat:     Pharynx: No oropharyngeal exudate.  Eyes:     Conjunctiva/sclera: Conjunctivae normal.     Pupils: Pupils are equal, round, and reactive to light.  Neck:     Thyroid: No thyromegaly.  Cardiovascular:     Rate and Rhythm: Normal rate and regular rhythm.     Heart sounds: Normal heart sounds. No murmur heard.   No friction rub. No gallop.  Pulmonary:     Effort: Pulmonary effort is normal.     Breath sounds: Normal breath sounds.  Abdominal:     General: Bowel sounds are normal. There is no distension.     Palpations: Abdomen is soft. There is no mass.     Tenderness: There is no abdominal tenderness. There is no guarding.     Hernia: No hernia is present.  Musculoskeletal:        General: No tenderness or deformity. Normal range of motion.     Cervical back: Normal range of motion and neck supple.      Comments: She does have difficulty with getting up on the table secondary to arthritis in the knees.  Left shoulder has significant reduction of range of motion with abduction limited at about 90 degrees, slightly less.  There is grinding and popping within the left shoulder joint.  She does have some mild trapezius tenderness to palpation on the left shoulder.  Lymphadenopathy:     Cervical: No cervical adenopathy.  Skin:    General: Skin is warm and dry.     Findings: No rash.  Neurological:     Mental Status: She is alert and oriented to person, place, and time.     Coordination: Present: mildly positive.     Deep Tendon Reflexes: Reflexes normal.     Reflex Scores:      Tricep reflexes are 3+ on the right side and 3+ on the left side.      Bicep reflexes are 3+ on the right side and 3+ on the left side.      Brachioradialis reflexes are 3+ on the right side and 3+ on the left side.      Patellar reflexes are 3+ on the right side and 3+ on the left side.    Comments: Cannot walk in heel-to-toe gait secondary to hip/knee arthritis.  Psychiatric:        Speech: Speech normal.        Behavior: Behavior normal.        Thought Content: Thought content normal.    Assessment/Plan: Health Maintenance Due  Topic Date Due   DEXA SCAN  Never done   Health Maintenance reviewed.  1. Preventative health care She is having difficulty with balance/walking which can limit regular activity.  We are going to refer to physical therapy.  See below.  2. Partial epilepsy with impairment of consciousness [facility]) She does follow regularly with neurology.  Has upcoming appointment in January.  This will be good as they can also assess her difficulties with balance.  3. Hypertension, unspecified type Currently patient's blood pressure stable without medication.  Would suggest continuing to monitor. - CBC with Differential/Platelet; Future - CBC with Differential/Platelet  4. Hyperglycemia Will monitor  with blood work.  Has been diet controlled.  5. Hyponatremia - Comprehensive metabolic panel; Future - Comprehensive metabolic panel  6. Balance problem Uncertain cause of this.  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  Current issue more of an off-balance.  She does see neurology in January so they can also follow-up.  In the meanwhile, she is agreeable to trying some physical therapy to work on balance. - TSH; Future - Vitamin B12; Future - VITAMIN D 25 Hydroxy (Vit-D Deficiency, Fractures); Future - Folate; Future - Folate - VITAMIN D 25 Hydroxy (Vit-D Deficiency, Fractures) - Vitamin B12 - TSH - Ambulatory referral to Physical Therapy  7. Need for pneumococcal vaccination  - Pneumococcal conjugate vaccine 20-valent (Prevnar 20)  8. Left shoulder pain, unspecified chronicity Patient states she will call her orthopedist for follow-up.  Return in about 6 months (around 10/12/2021) for Chronic condition visit.  Micheline Rough, MD

## 2021-04-14 LAB — CBC WITH DIFFERENTIAL/PLATELET
Basophils Absolute: 0 10*3/uL (ref 0.0–0.1)
Basophils Relative: 0.9 % (ref 0.0–3.0)
Eosinophils Absolute: 0 10*3/uL (ref 0.0–0.7)
Eosinophils Relative: 0.8 % (ref 0.0–5.0)
HCT: 38 % (ref 36.0–46.0)
Hemoglobin: 12.8 g/dL (ref 12.0–15.0)
Lymphocytes Relative: 32.6 % (ref 12.0–46.0)
Lymphs Abs: 1.6 10*3/uL (ref 0.7–4.0)
MCHC: 33.8 g/dL (ref 30.0–36.0)
MCV: 90.9 fl (ref 78.0–100.0)
Monocytes Absolute: 0.5 10*3/uL (ref 0.1–1.0)
Monocytes Relative: 9.7 % (ref 3.0–12.0)
Neutro Abs: 2.7 10*3/uL (ref 1.4–7.7)
Neutrophils Relative %: 56 % (ref 43.0–77.0)
Platelets: 267 10*3/uL (ref 150.0–400.0)
RBC: 4.18 Mil/uL (ref 3.87–5.11)
RDW: 14.4 % (ref 11.5–15.5)
WBC: 4.8 10*3/uL (ref 4.0–10.5)

## 2021-04-14 LAB — VITAMIN B12: Vitamin B-12: 1013 pg/mL — ABNORMAL HIGH (ref 211–911)

## 2021-04-14 LAB — COMPREHENSIVE METABOLIC PANEL
ALT: 20 U/L (ref 0–35)
AST: 26 U/L (ref 0–37)
Albumin: 4.3 g/dL (ref 3.5–5.2)
Alkaline Phosphatase: 101 U/L (ref 39–117)
BUN: 9 mg/dL (ref 6–23)
CO2: 29 mEq/L (ref 19–32)
Calcium: 9.6 mg/dL (ref 8.4–10.5)
Chloride: 92 mEq/L — ABNORMAL LOW (ref 96–112)
Creatinine, Ser: 0.79 mg/dL (ref 0.40–1.20)
GFR: 78.72 mL/min (ref >60.00)
Glucose, Bld: 89 mg/dL (ref 70–99)
Potassium: 4.3 mEq/L (ref 3.5–5.1)
Sodium: 129 mEq/L — ABNORMAL LOW (ref 135–145)
Total Bilirubin: 0.4 mg/dL (ref 0.2–1.2)
Total Protein: 7.8 g/dL (ref 6.0–8.3)

## 2021-04-14 LAB — TSH: TSH: 1.07 u[IU]/mL (ref 0.35–5.50)

## 2021-04-14 LAB — FOLATE: Folate: >23.4 ng/mL (ref >5.9)

## 2021-04-14 LAB — VITAMIN D 25 HYDROXY (VIT D DEFICIENCY, FRACTURES): VITD: 61.26 ng/mL (ref 30.00–100.00)

## 2021-04-15 ENCOUNTER — Other Ambulatory Visit: Payer: Self-pay | Admitting: Family Medicine

## 2021-04-18 ENCOUNTER — Other Ambulatory Visit: Payer: Self-pay | Admitting: Neurology

## 2021-04-28 ENCOUNTER — Ambulatory Visit: Payer: Medicare Other | Attending: Family Medicine

## 2021-04-28 ENCOUNTER — Other Ambulatory Visit: Payer: Self-pay

## 2021-04-28 DIAGNOSIS — M6281 Muscle weakness (generalized): Secondary | ICD-10-CM | POA: Diagnosis not present

## 2021-04-28 DIAGNOSIS — Z9181 History of falling: Secondary | ICD-10-CM | POA: Diagnosis not present

## 2021-04-28 DIAGNOSIS — R2689 Other abnormalities of gait and mobility: Secondary | ICD-10-CM | POA: Diagnosis not present

## 2021-04-28 DIAGNOSIS — R262 Difficulty in walking, not elsewhere classified: Secondary | ICD-10-CM | POA: Diagnosis not present

## 2021-04-28 NOTE — Therapy (Signed)
[facility] @ [place], [place], [address] Phone: [phone]   Fax:  [phone]  Physical Therapy Evaluation  Patient Details  Name: [name] [name] MRN: [id number] Date of Birth: 1955-08-15 Referring Provider (PT): Micheline Rough, MD   Encounter Date: 04/28/2021   PT End of Session - 04/28/21 2053     Visit Number 1    Authorization Type [employer] Medicare    PT Start Time 1027    PT Stop Time 1528    PT Time Calculation (min) 43 min    Activity Tolerance Patient tolerated treatment well    Behavior During Therapy [facility] for tasks assessed/performed             Past Medical History:  Diagnosis Date   Chicken pox    Conductive hearing loss, childhood onset    Deafness    from medication a a child for chicken pox   Epilepsy, followed by Dr. Tomma Rakers in neurology 08/26/2012   Hx of adenomatous colonic polyps 11/19/2016   Pacemaker    Pacemaker    2012   Seizure ([place])    Seizures ([place])    Petite Mal     Past Surgical History:  Procedure Laterality Date   HIP ARTHROPLASTY     PACEMAKER INSERTION  07/2010   TOTAL HIP ARTHROPLASTY  07/2011   right   TOTAL HIP ARTHROPLASTY  03/2012   left   TOTAL SHOULDER ARTHROPLASTY Right 07/15/2020   Procedure: TOTAL SHOULDER ARTHROPLASTY;  Surgeon: Tania Ade, MD;  Location: [facility];  Service: Orthopedics;  Laterality: Right;  NEED 120 MINUTES    There were no vitals filed for this visit.    Subjective Assessment - 04/28/21 2028     Subjective Patient arrives with interpreter for hearing impairment.  She explains that she had bilateral hip replacements back in 2014 and did well but in the past year she has developed significant weakness and a feeling of unsteadiness and fear of falling.  She states that she usually walks her dog daily by herself but fell recently and now she feels unsure of walking without assistance outdoors.  She describes needing to look down at the  ground to be sure of herself when walking.  She states that when she looks straight ahead, she feels like she is going to fall.    Patient is accompained by: Interpreter    Pertinent History hearing impaired, bilateral hip replacements, right shoulder replacement    Limitations Walking    How long can you sit comfortably? unlimited    How long can you stand comfortably? 15 min    How long can you walk comfortably? 15 min    Patient Stated Goals to feel more steady and avoid falling    Currently in Pain? No/denies                [facility] PT Assessment - 04/28/21 0001       Assessment   Medical Diagnosis Balance problem    Referring Provider (PT) Micheline Rough, MD    Onset Date/Surgical Date 05/01/20    Hand Dominance Right    Next MD Visit neuro in January    Prior Therapy for hip replacements      Precautions   Precautions Anterior Hip      Restrictions   Weight Bearing Restrictions No      Balance Screen   Has the patient fallen in the past 6 months Yes  How many times? 2    Has the patient had a decrease in activity level because of a fear of falling?  Yes    Is the patient reluctant to leave their home because of a fear of falling?  Yes      [place] Private residence    Living Arrangements Spouse/significant other;Children    Type of [place] to enter    Entrance Stairs-Number of Steps 3    Entrance Stairs-Rails Right    Home Layout Two level;Able to live on main level with bedroom/bathroom    Home Equipment None      Prior Function   Level of Independence Independent    Vocation On disability;Retired    Leisure walking outdoors      Cognition   Overall Cognitive Status Within Functional Limits for tasks assessed      ROM / Strength   AROM / PROM / Strength AROM;Strength      AROM   Overall AROM  Within functional limits for tasks performed      Strength   Strength Assessment Site Hip;Knee     Right/Left Hip Right;Left    Right Hip Flexion 4/5    Right Hip Extension 3+/5    Right Hip External Rotation  4/5    Right Hip Internal Rotation 4-/5    Right Hip ABduction 4-/5    Right Hip ADduction 5/5    Left Hip Flexion 4/5    Left Hip Extension 4-/5    Left Hip External Rotation 4/5    Left Hip Internal Rotation 3+/5    Left Hip ABduction 3+/5    Left Hip ADduction 4+/5    Right/Left Knee Right;Left    Right Knee Flexion 4-/5    Right Knee Extension 4-/5    Left Knee Flexion 4-/5    Left Knee Extension 4-/5      Special Tests   Other special tests rapid head turns seated , cervical flexion/extension rapid : both negative for dizziness      Transfers   Transfers Supine to Sit;Sit to Supine;Sit to Stand    Sit to Stand 5: Supervision    Five time sit to stand comments  21 sec    Supine to Sit 3: Mod assist    Sit to Supine 4: Min assist      Ambulation/Gait   Ambulation/Gait Yes    Gait Pattern Step-through pattern;Decreased arm swing - right;Decreased arm swing - left;Decreased step length - right;Decreased step length - left;Lateral hip instability;Poor foot clearance - left;Poor foot clearance - right      Balance   Balance Assessed Yes      Standardized Balance Assessment   Standardized Balance Assessment Timed Up and Go Test      Timed Up and Go Test   TUG Normal TUG    Normal TUG (seconds) 18                        Objective measurements completed on examination: See above findings.                PT Education - 04/28/21 2042     Education Details Educated patient on hip and core weakness and how this can greatly affect her balance.    Person(s) Educated Patient;Child(ren)    Methods Explanation;Handout    Comprehension Returned demonstration;Tactile cues required  PT Short Term Goals - 04/28/21 2141       PT SHORT TERM GOAL #1   Title Pt will be independent with intial HEP    Time 4    Period Weeks     Status New    Target Date 05/26/21      PT SHORT TERM GOAL #2   Title No falls during first 4 weeks of PT episode    Time 4    Period Weeks    Status New    Target Date 05/26/21               PT Long Term Goals - 04/28/21 2143       PT LONG TERM GOAL #1   Title Pt will be indepenent with advanced HEP    Time 8    Period Weeks    Status New    Target Date 06/23/21      PT LONG TERM GOAL #2   Title Hip strength to be 4 to 4+/5 throughout    Time 8    Period Weeks    Status New    Target Date 06/23/21      PT LONG TERM GOAL #3   Title Patient to demonstrate safe heel to toe progression with proper step length    Time 8    Period Weeks    Status New    Target Date 06/23/21      PT LONG TERM GOAL #4   Title Patient will be able to walk outdoors independently and walk her dog safely    Time 8    Period Weeks    Status New    Target Date 06/23/21                    Plan - 04/28/21 2054     Clinical Impression Statement Patient is a 65 y.o. hearing impaired female who has history of bilateral hip replacements approx 9 years ago.  She states that she recovered very well from her surgeries despite them being withing the same year.  Her unsteadiness seems to have developed rapidly.  She has fallen 2 times in the past few months.  She presents with significant core and proximal hip weakness, needing mod assist to transfer supine to sit. Gait is unsteady and somewhat ataxic at times with poor foot clearance.  She is able to come to stand without UE use x 5 but slowly.  She will be assessed by neurologist in early January. She would benefit from skilled PT to  initiate a comprehensive core and hip strengthening program and begin using rollator walker when ambulating outdoors independently to avoid falls until thorough neuro exam is complete and she begins gaining strength in core and hips through physical therapy.  She was averse to idea of using walker.     Personal Factors and Comorbidities Comorbidity 1;Comorbidity 2    Comorbidities Bilateral hip THA, Right shoulder TSA,    Examination-Activity Limitations Lift;Bend;Squat    Examination-Participation Restrictions Driving;Meal Prep;Community Activity;Shop;Laundry    Stability/Clinical Decision Making Evolving/Moderate complexity    Clinical Decision Making Moderate    Rehab Potential Fair   Patient does not want to begin PT until seeing neurologist   PT Frequency 1x / week    PT Duration 8 weeks    PT Treatment/Interventions ADLs/Self Care Home Management;Aquatic Therapy;Traction;Moist Heat;Iontophoresis 4mg /ml Dexamethasone;Electrical Stimulation;Cryotherapy;Ultrasound;DME Instruction;Gait training;Therapeutic exercise;Therapeutic activities;Functional mobility training;Stair training;Balance training;Neuromuscular re-education;Patient/family education;Manual techniques;Passive range of motion;Vestibular;Vasopneumatic Device;Taping;Splinting;Energy conservation;Dry needling;Joint  Manipulations    PT Next Visit Plan Patient wants to hold on PT until seeing neurologist.  We will focus on core and hip strength along with balance if patient returns.    PT Home Exercise Plan Access Code: [email or web address]    [email or web address] and Agree with Plan of Care Patient             Patient will benefit from skilled therapeutic intervention in order to improve the following deficits and impairments:  Abnormal gait, Decreased balance, Decreased endurance, Difficulty walking, Decreased coordination, Decreased safety awareness, Decreased strength, Impaired UE functional use, Pain  Visit Diagnosis: History of falling - Plan: PT plan of care cert/re-cert  Muscle weakness (generalized) - Plan: PT plan of care cert/re-cert  Difficulty in walking, not elsewhere classified - Plan: PT plan of care cert/re-cert  Balance disorder - Plan: PT plan of care cert/re-cert     Problem List Patient Active Problem List   Diagnosis  Date Noted   Balance disorder 04/28/2021   Hyperglycemia 04/13/2021   Hypertension 04/13/2021   Squamous cell carcinoma, 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   Hx of adenomatous colonic polyps 11/19/2016   Localization-related idiopathic epilepsy and epileptic syndromes with seizures of localized onset, not intractable, without status epilepticus ([place]) 01/01/2015   Drop attack 01/01/2015   Dizziness and giddiness 01/01/2015   Partial epilepsy with impairment of consciousness ([place]) 09/12/2013   Pacemaker-dual chamber medtronic 10/25/2012   Deaf 08/26/2012   Migraine 08/26/2012   Sinus node dysfunction ([place]) 08/26/2012   H/O bilateral hip replacements 08/26/2012    Victorious Cosio B. Artice Bergerson, PT 04/28/2209:01 PM   [facility] @ [place], [place], [address] Phone: [phone]   Fax:  [phone]  Name: [name] [name] MRN: [id number] Date of Birth: 09-19-1955

## 2021-04-28 NOTE — Patient Instructions (Signed)
Access Code: [email or web address] URL: [email or web address] Date: 04/28/2021 Prepared by: Candyce Churn  Exercises Seated March - 2 x daily - 7 x weekly - 1 sets - 20 reps Seated Long Arc Quad - 2 x daily - 7 x weekly - 1 sets - 20 reps Supine Bridge - 2 x daily - 7 x weekly - 2 sets - 10 reps Standing Hip Abduction with Counter Support - 2 x daily - 7 x weekly - 1 sets - 20 reps Standing Hip Extension with Counter Support - 2 x daily - 7 x weekly - 1 sets - 20 reps

## 2021-05-01 DIAGNOSIS — E559 Vitamin D deficiency, unspecified: Secondary | ICD-10-CM

## 2021-05-01 DIAGNOSIS — E538 Deficiency of other specified B group vitamins: Secondary | ICD-10-CM

## 2021-05-01 HISTORY — DX: Vitamin D deficiency, unspecified: E55.9

## 2021-05-01 HISTORY — DX: Deficiency of other specified B group vitamins: E53.8

## 2021-05-02 ENCOUNTER — Other Ambulatory Visit: Payer: Self-pay | Admitting: Neurology

## 2021-05-03 NOTE — Progress Notes (Signed)
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 3  Elevated blood pressure   Continue lamotrigine ER 300mg  BID and oxcarbazepine 300mg  in AM and 600mg  in PM. To evaluate for objective findings of ongoing subclinical seizures, will check a 48 hour ambulatory EEG Check repeat BMP in one month to follow Na.  Will also check another lamotrigine level and 10-Hydroxycarbazepine level. Follow up with PCP regarding blood pressure Further recommendations pending results.  Otherwise, follow up 7 months.   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  She is accompanied by an interpreter.   UPDATE: Current medications: Lamotrigine ER 300 mg twice daily, oxcarbazepine 300 mg in a.m. and 600 mg in p.m.  Due to ongoing staring spells, a 48 year ambulatory EEG was ordered which she never had performed.  Fell 2 weeks ago.    Last month, she was feeling dizzy and off balance.  Dizziness resolved but still feels of balance.  Labs in December included normal CBC, CMP with Na 129 and mildly low Cl 92 (baseline) but otherwise unremarkable, Vit D 61.26, TSH 1.07, folate over 23.4, B12 1,013. She has started going to physical therapy   HISTORY: Two types: 1) Staring spells, not fully respond, sometimes aware, tenses up but no convulsions, lasts seconds to a minute.    2) Drop attacks, loses conscious and falls.  Sometimes able to hold on to something.  She has had recurrence of drop attacks as well as dizzy spells for the past year.  It seems to have started following the death of her father.  The spells  were well controlled up until that time.  She does endorse depression.  She has one spell a month, usually at the end of the month.     Due to recurrence of drop attacks, she had another EEG performed on 06/17/14, which was normal.  Due to dizziness, trough levels of her antiepileptic medications were checked to look for toxicity, which were normal.  Levels were normal.   In March 2022, she felt weak and had a syncopal spell, unresponsive for several minutes.  She then has had intermittent episodes of unresponsiveness.  She was brought to the ED where EEG showed mild diffuse slowing but no epileptiform discharges or seizures.  CT head personally reviewed was negative for acute findings.  Labs were unremarkable, with Na 130, which is baseline.       She also has migraines once in awhile.   Prior medications:  She had side effects to Trileptal at 900mg .  Other medications tried, include Dilantin (ineffective), and immediate-release lamotrigine (ineffective).   Prior studies: EEG: focal slowing and frequent spike and wave activity in left anterior temporal lobe. MRI Brain (04/13/97): focal area of encephalomalacia in posterior left temporal lobe. MRI Brain (10/19/08): possible left mesial temporal sclerosis and lesion in left posterior temporal lobe.   She does not drive.   When she was [age] years old, she developed a virus following a case of the chicken pox. 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  PAST MEDICAL  HISTORY: Past Medical History:  Diagnosis Date   Chicken pox    Conductive hearing loss, childhood onset    Deafness    from medication a a child for chicken pox   Epilepsy, followed by Dr. Tomma Rakers in neurology 08/26/2012   Hx of adenomatous colonic polyps 11/19/2016   Pacemaker    Pacemaker    2012   Seizure ([place])    Seizures ([place])    Petite Mal     MEDICATIONS: Current Outpatient Medications on File Prior to Visit  Medication Sig  Dispense Refill   carbamazepine (CARBATROL) 300 MG 12 hr capsule Take 1-2 capsules (300-600 mg total) by mouth See admin instructions. Take 1 capsule in the morning, and 2 capsules in the evening. 60 capsule 2   Cholecalciferol (VITAMIN D) 2000 UNITS CAPS Take 2,000 Units by mouth 2 (two) times daily.     folic acid (FOLVITE) 1 MG tablet Take 1 mg by mouth daily.     LamoTRIgine 300 MG TB24 24 hour tablet Take 1 tablet by mouth twice daily 60 tablet 0   magnesium 30 MG tablet Take 30 mg by mouth daily.      Multiple Vitamin (MULTIVITAMIN WITH MINERALS) TABS tablet Take 2 tablets by mouth daily.     Oxcarbazepine (TRILEPTAL) 300 MG tablet TAKE 1 TABLET BY MOUTH ONCE DAILY IN THE MORNING AND 2 IN THE EVENING 90 tablet 5   tiZANidine (ZANAFLEX) 4 MG tablet Take 1 tablet (4 mg total) by mouth every 8 (eight) hours as needed for muscle spasms. 30 tablet 1   vitamin C (ASCORBIC ACID) 500 MG tablet Take 500 mg by mouth daily.     No current facility-administered medications on file prior to visit.    ALLERGIES: No Known Allergies  FAMILY HISTORY: Family History  Problem Relation Age of Onset   Diabetes Father    Hypertension Father    Colon cancer Father 22   Hypertension Mother    Other Mother        skin lesions   Diabetes Mother        may be borderline   Healthy Brother    Healthy Brother    Esophageal cancer Neg Hx    Rectal cancer Neg Hx    Stomach cancer Neg Hx       Objective:  Blood pressure (!) 175/82, pulse 76, height 5\' 3"  (1.6 m), weight 181 lb 6.4 oz (82.3 kg), last menstrual period 08/29/2005, SpO2 98 %. General: No acute distress.  Patient appears well-groomed.   Head:  Normocephalic/atraumatic Eyes:  Fundi examined but not visualized Neck: supple, no paraspinal tenderness, full range of motion Heart:  Regular rate and rhythm Lungs:  Clear to auscultation bilaterally Back: No paraspinal tenderness Neurological Exam: alert and oriented to person, place, and time.   Speech fluent and not dysarthric, language intact.  Deaf, otherwise CN II-XII intact. Bulk and tone normal, muscle strength 5/5 throughout.  Sensation to light touch intact.  Deep tendon reflexes 2+ throughout, toes downgoing.  Finger to nose testing intact.  Gait steady Romberg negative.   Metta Clines, DO  CC: Micheline Rough, MD

## 2021-05-04 ENCOUNTER — Other Ambulatory Visit: Payer: Self-pay

## 2021-05-04 ENCOUNTER — Ambulatory Visit (INDEPENDENT_AMBULATORY_CARE_PROVIDER_SITE_OTHER): Payer: Medicare Other | Admitting: Neurology

## 2021-05-04 VITALS — BP 175/82 | HR 76 | Ht 63.0 in | Wt 181.4 lb

## 2021-05-04 DIAGNOSIS — R55 Syncope and collapse: Secondary | ICD-10-CM | POA: Diagnosis not present

## 2021-05-04 DIAGNOSIS — G40009 Localization-related (focal) (partial) idiopathic epilepsy and epileptic syndromes with seizures of localized onset, not intractable, without status epilepticus: Secondary | ICD-10-CM

## 2021-05-04 DIAGNOSIS — R030 Elevated blood-pressure reading, without diagnosis of hypertension: Secondary | ICD-10-CM | POA: Diagnosis not present

## 2021-05-04 DIAGNOSIS — E871 Hypo-osmolality and hyponatremia: Secondary | ICD-10-CM | POA: Diagnosis not present

## 2021-05-04 DIAGNOSIS — Z79899 Other long term (current) drug therapy: Secondary | ICD-10-CM | POA: Diagnosis not present

## 2021-05-04 DIAGNOSIS — R2681 Unsteadiness on feet: Secondary | ICD-10-CM

## 2021-05-04 MED ORDER — OXCARBAZEPINE 300 MG PO TABS: ORAL_TABLET | ORAL | 5 refills | Status: DC

## 2021-05-04 MED ORDER — LAMOTRIGINE ER 300 MG PO TB24: 1.0000 | ORAL_TABLET | Freq: Two times a day (BID) | ORAL | 0 refills | Status: DC

## 2021-05-04 NOTE — Patient Instructions (Addendum)
Continue lamotrigine ER 300mg  twice daily.   Continue oxcarbazepine 300mg  in morning and 600mg  at night In 4 weeks, check BMP, lamotrigine level and oxcarbazepine level Check 48 hour ambulatory EEG Further recommendations pending results.   Otherwise, follow up 7 months.

## 2021-05-05 ENCOUNTER — Encounter: Payer: Self-pay | Admitting: Neurology

## 2021-05-17 ENCOUNTER — Telehealth: Payer: Self-pay | Admitting: *Deleted

## 2021-05-17 NOTE — Telephone Encounter (Signed)
LMOM to call us back to set up appointment for 48 hour ambulatory EEG. As of this phone call February 13th at 1pm is the first available appointment. Call us at [phone] ask to speak to Manuela Schwartz to set up an appointment that works for you.

## 2021-05-24 ENCOUNTER — Ambulatory Visit (INDEPENDENT_AMBULATORY_CARE_PROVIDER_SITE_OTHER): Payer: Medicare Other

## 2021-05-24 DIAGNOSIS — I495 Sick sinus syndrome: Secondary | ICD-10-CM

## 2021-05-24 LAB — CUP PACEART REMOTE DEVICE CHECK
Battery Impedance: 1300 Ohm
Battery Remaining Longevity: 62 mo
Battery Voltage: 2.76 V
Brady Statistic AP VP Percent: 0 %
Brady Statistic AP VS Percent: 0 %
Brady Statistic AS VP Percent: 0 %
Brady Statistic AS VS Percent: 100 %
Date Time Interrogation Session: 20230124135749
Implantable Lead Implant Date: 20120409
Implantable Lead Implant Date: 20120409
Implantable Lead Location: 753859
Implantable Lead Location: 753860
Implantable Lead Model: 4076
Implantable Lead Model: 4076
Implantable Pulse Generator Implant Date: 20120409
Lead Channel Impedance Value: 554 Ohm
Lead Channel Impedance Value: 612 Ohm
Lead Channel Pacing Threshold Amplitude: 0.625 V
Lead Channel Pacing Threshold Amplitude: 0.625 V
Lead Channel Pacing Threshold Pulse Width: 0.4 ms
Lead Channel Pacing Threshold Pulse Width: 0.4 ms
Lead Channel Setting Pacing Amplitude: 2 V
Lead Channel Setting Pacing Amplitude: 2.5 V
Lead Channel Setting Pacing Pulse Width: 0.4 ms
Lead Channel Setting Sensing Sensitivity: 5.6 mV

## 2021-06-03 NOTE — Progress Notes (Signed)
Remote pacemaker transmission.   

## 2021-06-06 ENCOUNTER — Other Ambulatory Visit (INDEPENDENT_AMBULATORY_CARE_PROVIDER_SITE_OTHER): Payer: Medicare Other

## 2021-06-06 ENCOUNTER — Other Ambulatory Visit: Payer: Self-pay

## 2021-06-06 DIAGNOSIS — Z79899 Other long term (current) drug therapy: Secondary | ICD-10-CM

## 2021-06-06 LAB — BASIC METABOLIC PANEL
BUN: 7 mg/dL (ref 6–23)
CO2: 31 mEq/L (ref 19–32)
Calcium: 9.8 mg/dL (ref 8.4–10.5)
Chloride: 90 mEq/L — ABNORMAL LOW (ref 96–112)
Creatinine, Ser: 0.75 mg/dL (ref 0.40–1.20)
GFR: 83.7 mL/min (ref >60.00)
Glucose, Bld: 117 mg/dL — ABNORMAL HIGH (ref 70–99)
Potassium: 4.4 mEq/L (ref 3.5–5.1)
Sodium: 127 mEq/L — ABNORMAL LOW (ref 135–145)

## 2021-06-08 NOTE — Progress Notes (Signed)
LMOVM for pt to give the office a call.

## 2021-06-08 NOTE — Progress Notes (Signed)
No answer at 317 06/08/2021

## 2021-06-10 LAB — 10-HYDROXYCARBAZEPINE: Triliptal/MTB(Oxcarbazepin): 18.9 ug/mL (ref 8.0–35.0)

## 2021-06-10 LAB — LAMOTRIGINE LEVEL: Lamotrigine Lvl: 13.6 ug/mL (ref 4.0–18.0)

## 2021-06-13 ENCOUNTER — Ambulatory Visit (INDEPENDENT_AMBULATORY_CARE_PROVIDER_SITE_OTHER): Payer: Medicare Other | Admitting: Neurology

## 2021-06-13 ENCOUNTER — Other Ambulatory Visit: Payer: Self-pay

## 2021-06-13 DIAGNOSIS — G40009 Localization-related (focal) (partial) idiopathic epilepsy and epileptic syndromes with seizures of localized onset, not intractable, without status epilepticus: Secondary | ICD-10-CM | POA: Diagnosis not present

## 2021-06-14 ENCOUNTER — Telehealth: Payer: Self-pay

## 2021-06-14 MED ORDER — LEVETIRACETAM 500 MG PO TABS: 500.0000 mg | ORAL_TABLET | Freq: Two times a day (BID) | ORAL | 11 refills | Status: DC

## 2021-06-14 NOTE — Telephone Encounter (Signed)
Pt mother advised of patient results. They will come by to pick up lab results tomorrow.

## 2021-06-14 NOTE — Telephone Encounter (Signed)
-----   Message from Pieter Partridge, DO sent at 06/06/2021  4:30 PM EST ----- The sodium level has been running a little lower than usual which may contribute to dizziness.  This is due to the oxcarbazepine.  I would like her to stop oxcarbazepine and start levetiracetam 500mg  twice daily.  She is to continue lamotrigine.

## 2021-06-16 ENCOUNTER — Other Ambulatory Visit: Payer: Self-pay | Admitting: Neurology

## 2021-06-16 NOTE — Procedures (Signed)
ELECTROENCEPHALOGRAM REPORT  Dates of Recording: 06/13/2021 at 12:59 PM to 06/15/2021 at 13:29 PM  Patient's Name: [name] [name] MRN: [id number] Date of Birth: [birth year]   Procedure: 48-hour ambulatory EEG  History: 66 year old female with deafness and history of asystole s/p pacemaker presents with recurrent episodes of dizziness and staring spells with history of seizures.  Medications:  Lamotrigine ER 300 mg twice daily, oxcarbazepine 300 mg in a.m. and 600 mg in p.m.  Technical Summary: This is a 48-hour multichannel digital EEG recording measured by the international 10-20 system with electrodes applied with paste and impedances below 5000 ohms performed as portable with EKG monitoring.  The digital EEG was referentially recorded, reformatted, and digitally filtered in a variety of bipolar and referential montages for optimal display.    DESCRIPTION OF RECORDING: During maximal wakefulness, the background activity consisted of a symmetric 9Hz  posterior dominant rhythm which was reactive to eye opening.  There were no epileptiform discharges or focal slowing seen in wakefulness.  During the recording, the patient progresses through wakefulness, drowsiness, and Stage 2 sleep.  During drowsiness, there was some mild shfting asymmetry in the temporal lobes.  Again, there were no epileptiform discharges seen.  Events: Patient reported lightheadedness/dizziness and nausea at 7:43.  Did not specify AM or PM.  Reviewed EEG at both times.    There were no electrographic seizures seen.  EKG lead was unremarkable.  IMPRESSION: This 48-hour ambulatory EEG study is normal.    CLINICAL CORRELATION: A normal EEG does not exclude a clinical diagnosis of epilepsy.  If further clinical questions remain, inpatient video EEG monitoring may be helpful.   Metta Clines, DO

## 2021-06-16 NOTE — Progress Notes (Signed)
Pt mother advised of patient EEG results.

## 2021-07-12 ENCOUNTER — Telehealth: Payer: Self-pay | Admitting: Family Medicine

## 2021-07-12 NOTE — Chronic Care Management (AMB) (Signed)
?  Chronic Care Management  ? ?Note ? ?07/12/2021 ?Name: [name] [name] MRN: [id number] DOB: Sep 06, 1955 ? ?[name] [name] is a 66 y.o. year old female who is a primary care patient of Koberlein, Steele Berg, MD. I reached out to Ivar Bury by phone today in response to a referral sent by [name] [name]'s PCP, [name], MD.  ? ?[name] [name] was given information about Chronic Care Management services today including:  ?CCM service includes personalized support from designated clinical staff supervised by her physician, including individualized plan of care and coordination with other care providers ?24/7 contact phone numbers for assistance for urgent and routine care needs. ?Service will only be billed when office clinical staff spend 20 minutes or more in a month to coordinate care. ?Only one practitioner may furnish and bill the service in a calendar month. ?The patient may stop CCM services at any time (effective at the end of the month) by phone call to the office staff. ? ? ?[name] [name]/ [name] [name] verbally agreed to assistance and services provided by embedded care coordination/care management team today. ? ?Follow up plan:PT [place] INTERPRETER ? ? ?[name] [name] ?Upstream Scheduler  ?

## 2021-07-20 ENCOUNTER — Telehealth: Payer: Self-pay | Admitting: Neurology

## 2021-07-20 DIAGNOSIS — Z13820 Encounter for screening for osteoporosis: Secondary | ICD-10-CM | POA: Diagnosis not present

## 2021-07-20 NOTE — Telephone Encounter (Signed)
Patients daughter called and stated [name] [name] got a jury summons for May.  She wanted to see if she could get letter stating she cannot go. ?

## 2021-07-21 NOTE — Telephone Encounter (Signed)
Patients daughter returned call and stated they just need a letter explaining that she is unable to attend Jury Duty due to her medical conditions.  ? ?Patients daughter aware we will call once Dr. [name] completes the letter.  ?

## 2021-07-21 NOTE — Telephone Encounter (Signed)
Called patients daughter and left a message for a call back. 

## 2021-07-25 ENCOUNTER — Encounter: Payer: Self-pay | Admitting: Neurology

## 2021-07-27 DIAGNOSIS — Z13820 Encounter for screening for osteoporosis: Secondary | ICD-10-CM | POA: Diagnosis not present

## 2021-07-27 DIAGNOSIS — Z780 Asymptomatic menopausal state: Secondary | ICD-10-CM | POA: Diagnosis not present

## 2021-07-28 NOTE — Telephone Encounter (Signed)
LMOVM for daughter. Letter ready at the front desk.  ?

## 2021-08-01 ENCOUNTER — Ambulatory Visit: Payer: Medicare Other

## 2021-08-05 ENCOUNTER — Telehealth: Payer: Self-pay | Admitting: Pharmacist

## 2021-08-05 NOTE — Chronic Care Management (AMB) (Signed)
? ? ?Chronic Care Management ?Pharmacy Assistant  ? ?Name: [name] [name]  MRN: [id number] DOB: 07-22-1955 ? ?[name] [name] is an 66 y.o. year old female who presents for her initial CCM visit with the clinical pharmacist. ? ?Reason for Encounter: Chart prep for initial visit with Jeni Salles clinical pharmacist on 08/10/2021 at 2:30 face to face visit. Patient's mother was reminded of the appointment and to bring all of her medications, vitamins and supplements to the appointment.  ?  ?Conditions to be addressed/monitored: ?HTN, Migraines, deaf, partial epilepsy, squamous cell carcinoma and hyperglycemia.  ? ?Recent office visits:  ?04/13/2021 Micheline Rough MD - Patient was seen for preventative health care and additional issues. No medication changes. Follow up in 6 months.  ? ?11/16/2020 Charlott Rakes LPN - Medicare annual wellness exam ? ?Recent consult visits:  ?06/13/2021 Metta Clines DO (neurology) - Patient was seen for Localization-related idiopathic epilepsy and epileptic syndromes with seizures of localized onset, not intractable, without status epilepticus. No medication changes. No follow up noted.  ? 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 Discontinued carbamazepine. Follow up in 7 months.  ? ?02/08/2021 Carmel Sacramento (optometry) - Patient was seen for Unspecified chorioretinal scars, bilateral and additional issues.  ? ?Hospital visits:  ?None ? ?Medications: ?Outpatient Encounter Medications as of 08/05/2021  ?Medication Sig  ? Cholecalciferol (VITAMIN D) 2000 UNITS CAPS Take 2,000 Units by mouth 2 (two) times daily. (Patient not taking: Reported on 05/04/2021)  ? folic acid (FOLVITE) 1 MG tablet Take 1 mg by mouth daily. (Patient not taking: Reported on 05/04/2021)  ? LamoTRIgine 300 MG TB24 24 hour tablet Take 1 tablet by mouth twice daily  ?  levETIRAcetam (KEPPRA) 500 MG tablet Take 1 tablet (500 mg total) by mouth 2 (two) times daily.  ? magnesium 30 MG tablet Take 30 mg by mouth daily.  (Patient not taking: Reported on 05/04/2021)  ? Multiple Vitamin (MULTIVITAMIN WITH MINERALS) TABS tablet Take 2 tablets by mouth daily. (Patient not taking: Reported on 05/04/2021)  ? Oxcarbazepine (TRILEPTAL) 300 MG tablet TAKE 1 TABLET BY MOUTH ONCE DAILY IN THE MORNING AND 2 IN THE EVENING  ? tiZANidine (ZANAFLEX) 4 MG tablet Take 1 tablet (4 mg total) by mouth every 8 (eight) hours as needed for muscle spasms.  ? vitamin C (ASCORBIC ACID) 500 MG tablet Take 500 mg by mouth daily.  ? ?No facility-administered encounter medications on file as of 08/05/2021.  ?Fill History: ?FOLIC ACID '1MG'$       TAB 07/20/2021 90  ? ?LAMOTRIGINE ER '300MG'$  TAB 07/19/2021 30  ? ?LevETIRAcetam '500MG'$  TAB 06/14/2021 60  ? ?OXCARBAZEPINE  300 MG TABS 06/01/2021 30  ? ?TiZANidine '4MG'$       TAB 07/15/2020 10  ? ?Have you seen any other providers since your last visit? Patient's mother denies any recent visits.  ? ?Any changes in your medications or health? Patient's mother denies any recent medication changes.  ? ?Any side effects from any medications? Patient's mother denies and known side effects.  ? ?Do you have an symptoms or problems not managed by your medications? Patient's mother denies any symptoms or problems not managed by medications .  ? ?Any concerns about your health right now? Patient's mother denies any concerns at this time ? ?Has your provider asked that you check blood pressure, blood sugar, or follow special diet at home? Patient's mother denies ? ?  Do you get any type of exercise on a regular basis? Patient's mother states she shops with her daughter and will occasionally walk the dog  ? ?Can you think of a goal you would like to reach for your health? Patient's mother denies ? ?Do you have any problems getting your medications? Patient's mother denies any issues getting  medications.  ? ?Is there anything that you would like to discuss during the appointment? Patient's mother denies ? ?Please bring medications and supplements to appointment ? ?Care Gaps: ?AWV - scheduled 11/29/2021 ?Last BP - 175/82 on 05/04/2021 ?Last A1C - 5.5 on 12/06/2020 ?Covid booster - overdue ?Papsmear - overdue ?Mammongram - overdue ?Dexa scan - never done ? ?Star Rating Drugs: ?None ? ?Gennie Alma CMA  ?Clinical Pharmacist Assistant ?[phone] ? ?

## 2021-08-10 ENCOUNTER — Ambulatory Visit (INDEPENDENT_AMBULATORY_CARE_PROVIDER_SITE_OTHER): Payer: Medicare Other | Admitting: Pharmacist

## 2021-08-10 DIAGNOSIS — I10 Essential (primary) hypertension: Secondary | ICD-10-CM

## 2021-08-10 DIAGNOSIS — G40209 Localization-related (focal) (partial) symptomatic epilepsy and epileptic syndromes with complex partial seizures, not intractable, without status epilepticus: Secondary | ICD-10-CM

## 2021-08-10 NOTE — Progress Notes (Signed)
? ?Chronic Care Management ?Pharmacy Note ? ?08/11/2021 ?Name:  [name] [name] MRN:  [id number] DOB:  October 04, 1955 ? ?Summary: ?BP not at goal < 140/90 per recent office readings ?Pt is not checking BP at home ? ?Recommendations/Changes made from today's visit: ?-Recommended monitoring BP at home a few times a week ?-Consider restarting amlodipine 2.5 mg daily ?-Recommend DEXA scan and mammogram ? ?Plan: ?Follow up with PCP ?BP assessment in 1 month ? ?Subjective: ?[name] [name] is an 66 y.o. year old female who is a primary patient of Koberlein, Steele Berg, MD.  The CCM team was consulted for assistance with disease management and care coordination needs.   ? ?Engaged with patient face to face for initial visit in response to provider referral for pharmacy case management and/or care coordination services.  ? ?Consent to Services:  ?The patient was given the following information about Chronic Care Management services today, agreed to services, and gave verbal consent: 1. CCM service includes personalized support from designated clinical staff supervised by the primary care provider, including individualized plan of care and coordination with other care providers 2. 24/7 contact phone numbers for assistance for urgent and routine care needs. 3. Service will only be billed when office clinical staff spend 20 minutes or more in a month to coordinate care. 4. Only one practitioner may furnish and bill the service in a calendar month. 5.The patient may stop CCM services at any time (effective at the end of the month) by phone call to the office staff. 6. The patient will be responsible for cost sharing (co-pay) of up to 20% of the service fee (after annual deductible is met). Patient agreed to services and consent obtained. ? ?Patient Care Team: ?[name], MD as PCP - General (Family Medicine) ?Aloha Gell, MD as Consulting Physician (Obstetrics and Gynecology) ?Pieter Partridge, DO as Consulting Physician  (Neurology) ?Viona Gilmore, [facility] as Pharmacist (Pharmacist) ? ?Recent office visits: ?04/13/2021 Micheline Rough MD - Patient was seen for preventative health care and additional issues. No medication changes. Follow up in 6 months.  ?  ?11/16/2020 Charlott Rakes LPN - Medicare annual wellness exam ? ?Recent consult visits: ?06/13/2021 Metta Clines DO (neurology) - Patient was seen for Localization-related idiopathic epilepsy and epileptic syndromes with seizures of localized onset, not intractable, without status epilepticus. No medication changes. No follow up noted.  ?  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 Discontinued carbamazepine. Follow up in 7 months.  ?  ?02/08/2021 Carmel Sacramento (optometry) - Patient was seen for Unspecified chorioretinal scars, bilateral and additional issues.  ? ?Hospital visits: ?None in previous 6 months ? ? ?Objective: ? ?Lab Results  ?Component Value Date  ? CREATININE 0.75 06/06/2021  ? BUN 7 06/06/2021  ? GFR 83.70 06/06/2021  ? GFRNONAA >60 07/17/2020  ? GFRAA >90 03/29/2014  ? NA 127 (L) 06/06/2021  ? K 4.4 06/06/2021  ? CALCIUM 9.8 06/06/2021  ? CO2 31 06/06/2021  ? GLUCOSE 117 (H) 06/06/2021  ? ? ?Lab Results  ?Component Value Date/Time  ? HGBA1C 5.5 12/06/2020 01:36 PM  ? HGBA1C 5.0 10/08/2020 03:42 PM  ? GFR 83.70 06/06/2021 02:45 PM  ? GFR 78.72 04/13/2021 03:42 PM  ?  ?Last diabetic Eye exam: No results found for: HMDIABEYEEXA  ?Last diabetic Foot exam: No results found for: HMDIABFOOTEX  ? ?Lab Results  ?Component Value Date  ?  CHOL 210 (H) 10/08/2020  ? HDL 81 10/08/2020  ? LDLCALC 115 (H) 10/08/2020  ? TRIG 46 10/08/2020  ? CHOLHDL 2.6 10/08/2020  ? ? ? ?  Latest Ref Rng & Units 04/13/2021  ?  3:42 PM 12/06/2020  ?  1:36 PM 10/08/2020  ?  3:42 PM  ?Hepatic Function  ?Total Protein 6.0 - 8.3 g/dL 7.8   7.4    7.4    ?Albumin 3.5 - 5.2 g/dL 4.3   4.4     ?AST 0 - 37 U/L '26   23   19    ' ?ALT 0 - 35 U/L '20   16   16    ' ?Alk Phosphatase 39 - 117 U/L 101   92     ?Total Bilirubin 0.2 - 1.2 mg/dL 0.4   0.4   0.4    ?Bilirubin, Direct 0.0 - 0.2 mg/dL   0.1    ? ? ?Lab Results  ?Component Value Date/Time  ? TSH 1.07 04/13/2021 03:42 PM  ? TSH 1.30 01/09/2020 10:26 AM  ? ? ? ?  Latest Ref Rng & Units 04/13/2021  ?  3:42 PM 12/06/2020  ?  1:36 PM 07/17/2020  ?  9:48 AM  ?CBC  ?WBC 4.0 - 10.5 K/uL 4.8   4.2   9.0    ?Hemoglobin 12.0 - 15.0 g/dL 12.8   12.8   11.0    ?Hematocrit 36.0 - 46.0 % 38.0   38.6   32.4    ?Platelets 150.0 - 400.0 K/uL 267.0   235.0   256    ? ? ?Lab Results  ?Component Value Date/Time  ? VD25OH 61.26 04/13/2021 03:42 PM  ? VD25OH 62 01/09/2020 10:26 AM  ? ? ?Clinical ASCVD: No  ?The 10-year ASCVD risk score (Arnett DK, et al., 2019) is: 8.8% ?  Values used to calculate the score: ?    Age: 66 years ?    Sex: Female ?    Is Non-Hispanic African American: No ?    Diabetic: No ?    Tobacco smoker: No ?    Systolic Blood Pressure: 518 mmHg ?    Is BP treated: No ?    HDL Cholesterol: 81 mg/dL ?    Total Cholesterol: 210 mg/dL   ? ? ?  04/13/2021  ?  2:44 PM 11/16/2020  ?  2:41 PM 01/09/2020  ?  9:01 AM  ?Depression screen PHQ 2/9  ?Decreased Interest  0 0  ?Down, Depressed, Hopeless 1 1 0  ?PHQ - 2 Score 1 1 0  ?Altered sleeping 0    ?Tired, decreased energy 1    ?Change in appetite 0    ?Feeling bad or failure about yourself  0    ?Trouble concentrating 0    ?Moving slowly or fidgety/restless 0    ?PHQ-9 Score 2    ?  ? ? ?Social History  ? ?Tobacco Use  ?Smoking Status Never  ?Smokeless Tobacco Never  ? ?BP Readings from Last 3 Encounters:  ?05/04/21 (!) 175/82  ?04/13/21 120/88  ?11/16/20 138/82  ? ?Pulse Readings from Last 3 Encounters:  ?05/04/21 76  ?04/13/21 70  ?11/16/20 70  ? ?Wt Readings from Last 3 Encounters:  ?05/04/21 181 lb 6.4 oz (82.3 kg)  ?04/13/21 181 lb 12.8 oz (82.5 kg)  ?11/16/20 178 lb 8 oz  (81 kg)  ? ?BMI Readings from Last 3 Encounters:  ?05/04/21 32.13 kg/m?  ?04/13/21 32.20 kg/m?  ?11/16/20 31.62 kg/m?  ? ? ?  Assessment/Interventions: Review of patient past medical history, allergies, medications, health status, including review of consultants reports, laboratory and other test data, was performed as part of comprehensive evaluation and provision of chronic care management services.  ? ?SDOH:  (Social Determinants of Health) assessments and interventions performed: Yes ?SDOH Interventions   ? ?Flowsheet Row Most Recent Value  ?SDOH Interventions   ?Financial Strain Interventions Intervention Not Indicated  ?Transportation Interventions Intervention Not Indicated  ? ?  ? ?SDOH Screenings  ? ?Alcohol Screen: Not on file  ?Depression (PHQ2-9): Low Risk   ? PHQ-2 Score: 2  ?Financial Resource Strain: Low Risk   ? Difficulty of Paying Living Expenses: Not hard at all  ?Food Insecurity: No Food Insecurity  ? Worried About Charity fundraiser in the Last Year: Never true  ? Ran Out of Food in the Last Year: Never true  ?Housing: Low Risk   ? Last Housing Risk Score: 0  ?Physical Activity: Sufficiently Active  ? Days of Exercise per Week: 7 days  ? Minutes of Exercise per Session: 60 min  ?Social Connections: Socially Isolated  ? Frequency of Communication with Friends and Family: Once a week  ? Frequency of Social Gatherings with Friends and Family: Never  ? Attends Religious Services: Never  ? Active Member of Clubs or Organizations: No  ? Attends Archivist Meetings: Never  ? Marital Status: Divorced  ?Stress: No Stress Concern Present  ? Feeling of Stress : Not at all  ?Tobacco Use: Low Risk   ? Smoking Tobacco Use: Never  ? Smokeless Tobacco Use: Never  ? Passive Exposure: Not on file  ?Transportation Needs: No Transportation Needs  ? Lack of Transportation (Medical): No  ? Lack of Transportation (Non-Medical): No  ? ? ?Patient is deaf and presented to the visit with an interpreter. Patient  was walking regularly with her dog but is nervous to. She had a few falls and loss of consciousness episodes where she had to be reminded of what happened. She does not feel dizzy or lightheaded before the falls, th

## 2021-08-11 NOTE — Patient Instructions (Signed)
Hi Tracie, ? ?It was great to get to meet you in person! Below is a summary of some of the topics we discussed.  ? ?Please make sure to start checking your blood pressure at home regularly as it was higher in the office today. ? ?Please reach out to me if you have any questions or need anything before our follow up! ? ?Best, ?Maddie ? ?Jeni Salles, PharmD, BCACP ?Clinical Pharmacist ?Therapist, music at [place] ?[phone] ? ? Visit Information ? ? Goals Addressed   ? ?  ?  ?  ?  ? This Visit's Progress  ?  Track and Manage My Blood Pressure-Hypertension     ?  Timeframe:  Long-Range Goal ?Priority:  High ?Start Date:                             ?Expected End Date:                      ? ?Follow Up Date 09/10/21  ?  ?- check blood pressure weekly ?- choose a place to take my blood pressure (home, clinic or office, retail store) ?- write blood pressure results in a log or diary  ?  ?Why is this important?   ?You won't feel high blood pressure, but it can still hurt your blood vessels.  ?High blood pressure can cause heart or kidney problems. It can also cause a stroke.  ?Making lifestyle changes like losing a little weight or eating less salt will help.  ?Checking your blood pressure at home and at different times of the day can help to control blood pressure.  ?If the doctor prescribes medicine remember to take it the way the doctor ordered.  ?Call the office if you cannot afford the medicine or if there are questions about it.   ?  ?Notes:  ?  ? ?  ? ?Patient Care Plan: [place]  ?  ? ?Problem Identified: Problem: Hypertension and Seizures   ?  ? ?Long-Range Goal: Patient-Specific Goal   ?Start Date: 08/10/2021  ?Expected End Date: 08/11/2022  ?This Visit's Progress: On track  ?Priority: High  ?Note:   ?Current Barriers:  ?Unable to independently monitor therapeutic efficacy ?Unable to maintain control of blood pressure ? ?Pharmacist Clinical Goal(s):  ?Patient will achieve adherence to  monitoring guidelines and medication adherence to achieve therapeutic efficacy ?maintain control of blood pressure as evidenced by home and office BP readings  through collaboration with PharmD and provider.  ? ?Interventions: ?1:1 collaboration with [name], MD regarding development and update of comprehensive plan of care as evidenced by provider attestation and co-signature ?Inter-disciplinary care team collaboration (see longitudinal plan of care) ?Comprehensive medication review performed; medication list updated in electronic medical record ? ?Hypertension (BP goal <140/90) ?-Uncontrolled ?-Current treatment: ?No medications ?-Medications previously tried: amlodipine (never started)  ?-Current home readings: owns an arm cuff but does not check ?-Current dietary habits: she is not adding any extra salt to food ?-Current exercise habits: walking some but nervous to due to previous falls ?-Denies hypotensive/hypertensive symptoms ?-Educated on BP goals and benefits of medications for prevention of heart attack, stroke and kidney damage; ?Importance of home blood pressure monitoring; ?Proper BP monitoring technique; ?-Counseled to monitor BP at home twice weekly, document, and provide log at future appointments ?-Counseled on diet and exercise extensively ?Recommended restarting blood pressure medication. ? ?Seizures (Goal: prevent seizures) ?-Controlled ?-Current  treatment  ?Lamotrigine 300 mg 1 tablet twice daily - Appropriate, Effective, Safe, Accessible ?Levetiracetam 500 mg 1 tablet twice daily - Appropriate, Effective, Safe, Accessible ?-Medications previously tried: oxcarbazepine (hyponatremia) ?-Recommended to continue current medication ? ?Health Maintenance ?-Vaccine gaps: COVID booster ?-Current therapy:  ?Folic acid 1 mg 1 tablet daily  ?Magnesium 400 mg 1 tablet daily ?Multivitamin 1 tablet daily (200 mg of vitamin C, 40 mcg of vitamin D, 500 mg of calcium) ?Vitamin D 1000 units 2 capsules  daily ?Vitamin C 500 mg 1 tablet daily ?-Educated on Cost vs benefit of each product must be carefully weighed by individual consumer ?-Patient is satisfied with current therapy and denies issues ?-Recommended to continue current medication ? ?Patient Goals/Self-Care Activities ?Patient will:  ?- take medications as prescribed as evidenced by patient report and record review ?check blood pressure a few times a week, document, and provide at future appointments ?target a minimum of 150 minutes of moderate intensity exercise weekly ? ?Follow Up Plan: The care management team will reach out to the patient again over the next 30 days.  ? ?  ? ? ?Ms. Gieske was given information about Chronic Care Management services today including:  ?CCM service includes personalized support from designated clinical staff supervised by her physician, including individualized plan of care and coordination with other care providers ?24/7 contact phone numbers for assistance for urgent and routine care needs. ?Standard insurance, coinsurance, copays and deductibles apply for chronic care management only during months in which we provide at least 20 minutes of these services. Most insurances cover these services at 100%, however patients may be responsible for any copay, coinsurance and/or deductible if applicable. This service may help you avoid the need for more expensive face-to-face services. ?Only one practitioner may furnish and bill the service in a calendar month. ?The patient may stop CCM services at any time (effective at the end of the month) by phone call to the office staff. ? ?Patient agreed to services and verbal consent obtained.  ? ?The patient verbalized understanding of instructions, educational materials, and care plan provided today and agreed to receive a mailed copy of patient instructions, educational materials, and care plan.  ?The pharmacy team will reach out to the patient again over the next 30 days.  ? ?Viona Gilmore, RPH  ?

## 2021-08-14 ENCOUNTER — Other Ambulatory Visit: Payer: Self-pay | Admitting: Family Medicine

## 2021-08-14 DIAGNOSIS — Z1231 Encounter for screening mammogram for malignant neoplasm of breast: Secondary | ICD-10-CM

## 2021-08-14 DIAGNOSIS — E2839 Other primary ovarian failure: Secondary | ICD-10-CM

## 2021-08-23 ENCOUNTER — Ambulatory Visit (INDEPENDENT_AMBULATORY_CARE_PROVIDER_SITE_OTHER): Payer: Medicare Other

## 2021-08-23 DIAGNOSIS — I495 Sick sinus syndrome: Secondary | ICD-10-CM | POA: Diagnosis not present

## 2021-08-25 LAB — CUP PACEART REMOTE DEVICE CHECK
Battery Impedance: 1381 Ohm
Battery Remaining Longevity: 59 mo
Battery Voltage: 2.76 V
Brady Statistic AP VP Percent: 0 %
Brady Statistic AP VS Percent: 0 %
Brady Statistic AS VP Percent: 0 %
Brady Statistic AS VS Percent: 100 %
Date Time Interrogation Session: 20230426134118
Implantable Lead Implant Date: 20120409
Implantable Lead Implant Date: 20120409
Implantable Lead Location: 753859
Implantable Lead Location: 753860
Implantable Lead Model: 4076
Implantable Lead Model: 4076
Implantable Pulse Generator Implant Date: 20120409
Lead Channel Impedance Value: 564 Ohm
Lead Channel Impedance Value: 586 Ohm
Lead Channel Pacing Threshold Amplitude: 0.625 V
Lead Channel Pacing Threshold Amplitude: 0.625 V
Lead Channel Pacing Threshold Pulse Width: 0.4 ms
Lead Channel Pacing Threshold Pulse Width: 0.4 ms
Lead Channel Setting Pacing Amplitude: 2 V
Lead Channel Setting Pacing Amplitude: 2.5 V
Lead Channel Setting Pacing Pulse Width: 0.4 ms
Lead Channel Setting Sensing Sensitivity: 5.6 mV

## 2021-08-26 DIAGNOSIS — M7062 Trochanteric bursitis, left hip: Secondary | ICD-10-CM | POA: Diagnosis not present

## 2021-08-26 DIAGNOSIS — M25562 Pain in left knee: Secondary | ICD-10-CM | POA: Diagnosis not present

## 2021-08-28 DIAGNOSIS — I10 Essential (primary) hypertension: Secondary | ICD-10-CM

## 2021-09-07 NOTE — Progress Notes (Signed)
Remote pacemaker transmission.   

## 2021-10-10 DIAGNOSIS — M7062 Trochanteric bursitis, left hip: Secondary | ICD-10-CM | POA: Diagnosis not present

## 2021-10-13 ENCOUNTER — Telehealth: Payer: Self-pay | Admitting: Pharmacist

## 2021-10-13 NOTE — Chronic Care Management (AMB) (Cosign Needed)
Chronic Care Management Pharmacy Assistant   Name: [name] [name]  MRN: [id number] DOB: 03-Jul-1955  Reason for Encounter: Disease State / Hypertension Assessment Call   Conditions to be addressed/monitored: HTN  Recent office visits:  None  Recent consult visits:  08/23/2021 Roma Kayser RN [facility]) - Patient was seen for Sinus node dysfunction. No medication changes. No follow up noted.  Hospital visits:  None  Medications: Outpatient Encounter Medications as of 10/13/2021  Medication Sig   cholecalciferol (VITAMIN D3) 25 MCG (1000 UNIT) tablet Take 1,000 Units by mouth 2 (two) times daily.   folic acid (FOLVITE) 1 MG tablet Take 1 mg by mouth daily.   LamoTRIgine 300 MG TB24 24 hour tablet Take 1 tablet by mouth twice daily   levETIRAcetam (KEPPRA) 500 MG tablet Take 1 tablet (500 mg total) by mouth 2 (two) times daily.   Magnesium 400 MG CAPS Take 400 mg by mouth daily.   Multiple Vitamin (MULTIVITAMIN WITH MINERALS) TABS tablet Take 2 tablets by mouth daily.   vitamin C (ASCORBIC ACID) 500 MG tablet Take 500 mg by mouth daily.   No facility-administered encounter medications on file as of 10/13/2021.  Fill History: ESTRADIOL  0.1 MG/GM CREA 33/00/7622 43   FOLIC ACID '1MG'$       TAB 07/20/2021 90   LAMOTRIGINE ER '300MG'$  TAB 07/19/2021 30   LevETIRAcetam '500MG'$  TAB 08/08/2021 60   Reviewed chart prior to disease state call. Spoke with patient regarding BP  Recent Office Vitals: BP Readings from Last 3 Encounters:  05/04/21 (!) 175/82  04/13/21 120/88  11/16/20 138/82   Pulse Readings from Last 3 Encounters:  05/04/21 76  04/13/21 70  11/16/20 70    Wt Readings from Last 3 Encounters:  05/04/21 181 lb 6.4 oz (82.3 kg)  04/13/21 181 lb 12.8 oz (82.5 kg)  11/16/20 178 lb 8 oz (81 kg)     Kidney Function Lab Results  Component Value Date/Time   CREATININE 0.75 06/06/2021 02:45 PM   CREATININE 0.79 04/13/2021 03:42 PM   CREATININE 0.85 01/09/2020 10:26  AM   CREATININE 0.77 05/15/2017 09:58 AM   GFR 83.70 06/06/2021 02:45 PM   GFRNONAA >60 07/17/2020 09:48 AM   GFRNONAA 71 09/12/2013 04:16 PM   GFRAA >90 03/29/2014 07:33 PM   GFRAA 82 09/12/2013 04:16 PM       Latest Ref Rng & Units 06/06/2021    2:45 PM 04/13/2021    3:42 PM 12/06/2020    1:36 PM  BMP  Glucose 70 - 99 mg/dL 117  89  83   BUN 6 - 23 mg/dL '7  9  8   '$ Creatinine 0.40 - 1.20 mg/dL 0.75  0.79  0.73   Sodium 135 - 145 mEq/L 127  129  130   Potassium 3.5 - 5.1 mEq/L 4.4  4.3  4.5   Chloride 96 - 112 mEq/L 90  92  94   CO2 19 - 32 mEq/L '31  29  27   '$ Calcium 8.4 - 10.5 mg/dL 9.8  9.6  9.5     Current antihypertensive regimen:  No medications  How often are you checking your Blood Pressure?   Spoke with patients mother Blanch Media, she states they have not gotten any readings recently, they needed to get a new monitor.  They purchase a new blood pressure monitor this week and will start checking twice a week. They will keep a log written down of dates and readings.   Current home  BP readings: No current readings, she has been unable to check.  What recent interventions/DTPs have been made by any provider to improve Blood Pressure control since last CPP Visit:       Recommended monitoring BP at home a few times a week.  Any recent hospitalizations or ED visits since last visit with CPP? No recent hospital visits.   What diet changes have been made to improve Blood Pressure Control?  Patient follows no specific diet Breakfast - patient will have cereal or toast Lunch / Dinner - patient will have a meal with a meat and vegetables Snack - patient will have a sandwich or fruit   What exercise is being done to improve your Blood Pressure Control?  Patient is out walking the dog twice daily  Adherence Review: Is the patient currently on ACE/ARB medication? No Does the patient have >5 day gap between last estimated fill dates? No  Care Gaps: AWV - scheduled 11/29/2021 Last  BP - 175/82 on 05/04/2021 HIV screen - never done Covid booster - overdue Papsmear - overdue Mammogram - scheduled 02/07/2022 Tdap - overdue  Star Rating Drugs: None  [place] Pharmacist Assistant [phone]

## 2021-10-27 ENCOUNTER — Telehealth: Payer: Self-pay | Admitting: Internal Medicine

## 2021-10-27 NOTE — Telephone Encounter (Signed)
   Name: [name] [name]  DOB: 20-Oct-1955  MRN: [id number]  Primary Cardiologist: None  Chart reviewed as part of pre-operative protocol coverage. Because of [name]'s past medical history and time since last visit, she will require a follow-up in-office visit in order to better assess preoperative cardiovascular risk.  Pre-op covering staff: - Please schedule appointment and call patient to inform them. If patient already had an upcoming appointment within acceptable timeframe, please add "pre-op clearance" to the appointment notes so provider is aware. - Please contact requesting surgeon's office via preferred method (i.e, phone, fax) to inform them of need for appointment prior to surgery.  Patient is not on any anticoagulation or antiplatelet therapy.  Elgie Collard, PA-C  10/27/2021, 12:49 PM

## 2021-10-27 NOTE — Telephone Encounter (Signed)
   Pre-operative Risk Assessment    Patient Name: [name] [name]  DOB: 01-01-1956 MRN: [id number]      Request for Surgical Clearance    Procedure:   Robotic Hysterectomy  Date of Surgery:  Clearance 12/15/21                                 Surgeon:  Dr. Aloha Gell Surgeon's Group or Practice Name:  [facility] Phone number:  [phone] Fax number:  [phone]   Type of Clearance Requested:   - Medical         5. What type of anesthesia will be used?  Press F2 and select the anesthesia to be used for the procedure.  :1}  Type of Anesthesia:  General   Not sure of pharmacy   Additional requests/questions:    Dorthey Sawyer   10/27/2021, 11:27 AM

## 2021-10-31 NOTE — Telephone Encounter (Signed)
Per [place], EP scheduler she has left a message for the pt to call back for appt for pre op clearance.

## 2021-11-04 NOTE — Telephone Encounter (Signed)
I s/w [place], EP scheduler yesterday who stated she left another message for the pt to call back for appt.    I have left a message today myself for the pt to call the office to call back to schedule an appt in the office with Dr. [name] or EP APP.   I will send FYI to requesting office that we have tried to reach the pt for appt in the office for pre op clearance. Though we have been unable to reach the pt.

## 2021-11-04 NOTE — Telephone Encounter (Signed)
Message has been sent to the pt through [place]. I will remove from the pre op call back pool at this time. We will address once the pt calls back. I will update the requesting office as well.

## 2021-11-07 NOTE — Telephone Encounter (Signed)
Per [place], EP scheduler the pt has been scheduled for 11/23/21 with Tommye Standard, [facility] for pre op clearance. I will update the requesting office the pt has appt 11/23/21

## 2021-11-10 ENCOUNTER — Telehealth: Payer: Self-pay | Admitting: Pharmacist

## 2021-11-10 NOTE — Chronic Care Management (AMB) (Signed)
Chronic Care Management Pharmacy Assistant   Name: Aroura Vasudevan  MRN: [id number] DOB: 08-22-1955  Reason for Encounter: Disease State / Hypertension Assessment Call   Conditions to be addressed/monitored: HTN  Recent office visits:  None  Recent consult visits:  None  Hospital visits:  None  Medications: Outpatient Encounter Medications as of 11/10/2021  Medication Sig   cholecalciferol (VITAMIN D3) 25 MCG (1000 UNIT) tablet Take 1,000 Units by mouth 2 (two) times daily.   folic acid (FOLVITE) 1 MG tablet Take 1 mg by mouth daily.   LamoTRIgine 300 MG TB24 24 hour tablet Take 1 tablet by mouth twice daily   levETIRAcetam (KEPPRA) 500 MG tablet Take 1 tablet (500 mg total) by mouth 2 (two) times daily.   Magnesium 400 MG CAPS Take 400 mg by mouth daily.   Multiple Vitamin (MULTIVITAMIN WITH MINERALS) TABS tablet Take 2 tablets by mouth daily.   vitamin C (ASCORBIC ACID) 500 MG tablet Take 500 mg by mouth daily.   No facility-administered encounter medications on file as of 11/10/2021.  Fill History: ESTRADIOL  0.1 MG/GM CREA 30/16/0109 43   FOLIC ACID '1MG'$       TAB 10/10/2021 90   LAMOTRIGINE ER '300MG'$  TAB 10/10/2021 30   LevETIRAcetam '500MG'$  TAB 10/04/2021 60   Reviewed chart prior to disease state call. Spoke with patient regarding BP  Recent Office Vitals: BP Readings from Last 3 Encounters:  05/04/21 (!) 175/82  04/13/21 120/88  11/16/20 138/82   Pulse Readings from Last 3 Encounters:  05/04/21 76  04/13/21 70  11/16/20 70    Wt Readings from Last 3 Encounters:  05/04/21 181 lb 6.4 oz (82.3 kg)  04/13/21 181 lb 12.8 oz (82.5 kg)  11/16/20 178 lb 8 oz (81 kg)     Kidney Function Lab Results  Component Value Date/Time   CREATININE 0.75 06/06/2021 02:45 PM   CREATININE 0.79 04/13/2021 03:42 PM   CREATININE 0.85 01/09/2020 10:26 AM   CREATININE 0.77 05/15/2017 09:58 AM   GFR 83.70 06/06/2021 02:45 PM   GFRNONAA >60 07/17/2020 09:48 AM   GFRNONAA 71  09/12/2013 04:16 PM   GFRAA >90 03/29/2014 07:33 PM   GFRAA 82 09/12/2013 04:16 PM       Latest Ref Rng & Units 06/06/2021    2:45 PM 04/13/2021    3:42 PM 12/06/2020    1:36 PM  BMP  Glucose 70 - 99 mg/dL 117  89  83   BUN 6 - 23 mg/dL '7  9  8   '$ Creatinine 0.40 - 1.20 mg/dL 0.75  0.79  0.73   Sodium 135 - 145 mEq/L 127  129  130   Potassium 3.5 - 5.1 mEq/L 4.4  4.3  4.5   Chloride 96 - 112 mEq/L 90  92  94   CO2 19 - 32 mEq/L '31  29  27   '$ Calcium 8.4 - 10.5 mg/dL 9.8  9.6  9.5     Current antihypertensive regimen:  No current medications  How often are you checking your Blood Pressure? Patient was in [place] for the past two weeks, she was able to check her blood pressures 3 times while there.  Patients mother states she was not checking blood pressures prior to this. She was encouraged to continue checking blood pressures a couple times per week.   Current home BP readings: 10/15/21 - 138/75  10/21/21 - 128/78                                                    11/07/21 - 135/74  What recent interventions/DTPs have been made by any provider to improve Blood Pressure control since last CPP Visit: Patient was advised to check blood pressures a couple times per week.   Any recent hospitalizations or ED visits since last visit with CPP? No recent hospital visits.    What diet changes have been made to improve Blood Pressure Control?  Patient follows no specific diet Breakfast - patient will have cereal or toast Lunch / Dinner - patient will have a meal with a meat and vegetables or recently due to heat she has been eating more salads Snack - patient will have a sandwich or fruit    What exercise is being done to improve your Blood Pressure Control?  Patient is out walking the dog two to three times daily  Adherence Review: Is the patient currently on ACE/ARB medication? No Does the patient have >5 day gap between last estimated  fill dates? No  Care Gaps: AWV - scheduled 11/29/2021 Last BP - 175/82 on 05/04/2021 HIV screen - never done Covid booster - overdue [place] - overdue Mammogram - overdue Dexascan - never done Tdap - overdue  Star Rating Drugs: None  [facility] Pharmacist Assistant [phone]

## 2021-11-11 NOTE — Progress Notes (Unsigned)
NEUROLOGY FOLLOW UP OFFICE NOTE  [place] [id number]  Assessment/Plan:   Focal onset seizures with impaired consciousness - drop attacks and staring spells.  Some of the drop attacks and dizziness may have been related to hyponatremia secondary to oxcarbazepine.      Continue lamotrigine ER '300mg'$  BID and levetiracetam '500mg'$  BID Follow up 9 months.   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  She is accompanied by an interpreter.   UPDATE: Current medications: Lamotrigine ER 300 mg twice daily, levetiracetam '500mg'$  BID  Labs from February:  BMP with Na 127, K 4.4, Cl 90, CO2 31, glucose 117, BUN 7, Cr 0.75, GFR 83.70; lamotrigine 13.6; 10-hydroxycarbazepine 18.9.  Due to hyponatremia, she was switched from oxcarbazepine to levetiracetam '500mg'$  twice daily.  Since the change, she is feeling much better.  She has no longer had falls.  Not feeling dizzy.     Due to ongoing staring spells, a 48 year ambulatory EEG was performed in February which was normal.  She did report episode of lightheadedness with no electrographic correlate.        HISTORY: Two types: 1) Staring spells, not fully respond, sometimes aware, tenses up but no convulsions, lasts seconds to a minute.    2) Drop attacks, loses conscious and falls.  Sometimes able to hold on to something.  She has had recurrence of drop attacks as well as dizzy spells for the past year.  It seems to have started following the death of her father.  The spells were well controlled up until that time.  She does endorse depression.  She has one spell a month, usually at the end of the month.     Due to recurrence of drop attacks, she had another EEG performed on 06/17/14, which was normal.  Due to dizziness, trough levels of her  antiepileptic medications were checked to look for toxicity, which were normal.  Levels were normal.    In March 2022, she felt weak and had a syncopal spell, unresponsive for several minutes.  She then has had intermittent episodes of unresponsiveness.  She was brought to the ED where EEG showed mild diffuse slowing but no epileptiform discharges or seizures.  CT head personally reviewed was negative for acute findings.  Labs were unremarkable, with Na 130, which is baseline.       She also has migraines once in awhile.   Prior medications:  She had side effects to Trileptal at '900mg'$ .  Other medications tried, include Dilantin (ineffective), and immediate-release lamotrigine (ineffective), Trileptal (hyponatremia)   Prior studies: EEG: focal slowing and frequent spike and wave activity in left anterior temporal lobe. MRI Brain (04/13/97): focal area of encephalomalacia in posterior left temporal lobe. MRI Brain (10/19/08): possible left mesial temporal sclerosis and lesion in left posterior temporal lobe.   She does not drive.   When she was [age] years old, she developed a virus following a case of the chicken pox. 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  PAST MEDICAL HISTORY: Past Medical History:  Diagnosis Date   Chicken pox    Conductive hearing loss, childhood onset    Deafness    from medication a a child for chicken pox   Epilepsy, followed by Dr. Tomma Rakers in  neurology 08/26/2012   Hx of adenomatous colonic polyps 11/19/2016   Pacemaker    Pacemaker    2012   Seizure ([place])    Seizures ([place])    Petite Mal     MEDICATIONS: Current Outpatient Medications on File Prior to Visit  Medication Sig Dispense Refill   cholecalciferol (VITAMIN D3) 25 MCG (1000 UNIT) tablet Take 1,000 Units by mouth 2 (two) times daily.     folic acid (FOLVITE) 1 MG tablet Take 1 mg by mouth daily.     LamoTRIgine 300 MG TB24 24 hour tablet Take 1 tablet by mouth  twice daily 60 tablet 5   levETIRAcetam (KEPPRA) 500 MG tablet Take 1 tablet (500 mg total) by mouth 2 (two) times daily. 120 tablet 11   Magnesium 400 MG CAPS Take 400 mg by mouth daily.     Multiple Vitamin (MULTIVITAMIN WITH MINERALS) TABS tablet Take 2 tablets by mouth daily.     vitamin C (ASCORBIC ACID) 500 MG tablet Take 500 mg by mouth daily.     No current facility-administered medications on file prior to visit.    ALLERGIES: No Known Allergies  FAMILY HISTORY: Family History  Problem Relation Age of Onset   Diabetes Father    Hypertension Father    Colon cancer Father 7   Hypertension Mother    Other Mother        skin lesions   Diabetes Mother        may be borderline   Healthy Brother    Healthy Brother    Esophageal cancer Neg Hx    Rectal cancer Neg Hx    Stomach cancer Neg Hx       Objective:  Blood pressure 119/78, pulse 78, height '5\' 6"'$  (1.676 m), weight 180 lb 12.8 oz (82 kg), last menstrual period 08/29/2005, SpO2 96 %. General: No acute distress.  Patient appears well-groomed.   Head:  Normocephalic/atraumatic Eyes:  Fundi examined but not visualized Neck: supple, no paraspinal tenderness, full range of motion Heart:  Regular rate and rhythm Neurological Exam: alert and oriented to person, place, and time.  Speech fluent and not dysarthric, language intact.  Deaf.  Otherwise, CN II-XII intact. Bulk and tone normal, muscle strength 5/5 throughout.  Sensation to light touch intact.  Deep tendon reflexes 2+ throughout, toes downgoing.  Finger to nose testing intact.  Gait normal, Romberg negative.   Metta Clines, DO  CC: Micheline Rough, MD

## 2021-11-14 ENCOUNTER — Encounter: Payer: Self-pay | Admitting: Neurology

## 2021-11-14 ENCOUNTER — Ambulatory Visit (INDEPENDENT_AMBULATORY_CARE_PROVIDER_SITE_OTHER): Payer: Medicare Other | Admitting: Neurology

## 2021-11-14 VITALS — BP 119/78 | HR 78 | Ht 66.0 in | Wt 180.8 lb

## 2021-11-14 DIAGNOSIS — G40009 Localization-related (focal) (partial) idiopathic epilepsy and epileptic syndromes with seizures of localized onset, not intractable, without status epilepticus: Secondary | ICD-10-CM

## 2021-11-18 DIAGNOSIS — M7062 Trochanteric bursitis, left hip: Secondary | ICD-10-CM | POA: Diagnosis not present

## 2021-11-22 ENCOUNTER — Ambulatory Visit (INDEPENDENT_AMBULATORY_CARE_PROVIDER_SITE_OTHER): Payer: Medicare Other

## 2021-11-22 DIAGNOSIS — I495 Sick sinus syndrome: Secondary | ICD-10-CM | POA: Diagnosis not present

## 2021-11-22 NOTE — Progress Notes (Unsigned)
Cardiology Office Note Date:  11/22/2021  Patient ID:  [name] [name] 05-Jan-1956, MRN [id number] PCP:  [name], MD (Inactive)  Neurology: Dr. [name] Electrophysiologist: Dr. [name]   Chief Complaint: pre-op,  annual EP visit  History of Present Illness: [name] [name] is a 66 y.o. female with history of syncope/asystole s/p PPM, drop attacks, epilepsy, follows with neurology, deafness, HTN  She comes in today to be seen for Dr. [name].  I saw her 05/28/2017 She comes today accompanied by a sign language interpretor [name] w/[facility]).   The patient reports doing very well, no CP, palpitations or SOB, no dizzy spells, no near syncope or syncope.  She sees her PMD routinely and reports routine labs done there.  She saw Dr. [name] Feb 2020, was doing well, no changes were made.  She saw A. Lynnell Jude, NP March 2021, doing well, no changes were made.  I saw her 07/09/20 today's visit is with the help of [name], sign language interpretor. She denies any CP, palpitations or SOB. No difficulties with her ADLs, Jan 31st she had a fainting spell that reuired laceration repair/head. In review of her ER record, felt to have been a seizure, no changes were made to her meds in d/w neurology at the time. None since No dizzy spells, no near syncope. She was felt an acceptable risk for shoulder surgery   Pending robotic assisted hysterectomy, scheduled 12/15/21  + remotes  TODAY She is accompanied by sign language translator, Claiborne Billings She is uncertain exactly why they recommended a hysterectomy, knows nothing like cancer, but the details of this issue were unclear to her. She denies any CP, palpitations or cardiac awareness No SOB, walks her dog, takes care of her home without difficulties No near syncope or syncope.  RCRI is one, 0.9%   Device imformtion MDT dual chamber PPM, implanted 08/08/10  Past Medical History:  Diagnosis Date   Chicken pox    Conductive hearing loss,  childhood onset    Deafness    from medication a a child for chicken pox   Epilepsy, followed by Dr. Tomma Rakers in neurology 08/26/2012   Hx of adenomatous colonic polyps 11/19/2016   Pacemaker    Pacemaker    2012   Seizure ([place])    Seizures ([place])    Petite Mal     Past Surgical History:  Procedure Laterality Date   HIP ARTHROPLASTY     PACEMAKER INSERTION  07/2010   TOTAL HIP ARTHROPLASTY  07/2011   right   TOTAL HIP ARTHROPLASTY  03/2012   left   TOTAL SHOULDER ARTHROPLASTY Right 07/15/2020   Procedure: TOTAL SHOULDER ARTHROPLASTY;  Surgeon: Tania Ade, MD;  Location: [facility];  Service: Orthopedics;  Laterality: Right;  NEED 120 MINUTES    Current Outpatient Medications  Medication Sig Dispense Refill   cholecalciferol (VITAMIN D3) 25 MCG (1000 UNIT) tablet Take 1,000 Units by mouth 2 (two) times daily.     folic acid (FOLVITE) 1 MG tablet Take 1 mg by mouth daily.     LamoTRIgine 300 MG TB24 24 hour tablet Take 1 tablet by mouth twice daily 60 tablet 5   levETIRAcetam (KEPPRA) 500 MG tablet Take 1 tablet (500 mg total) by mouth 2 (two) times daily. 120 tablet 11   Magnesium 400 MG CAPS Take 400 mg by mouth daily.     Multiple Vitamin (MULTIVITAMIN WITH MINERALS) TABS tablet Take 2 tablets by mouth daily.     vitamin C (ASCORBIC ACID) 500  MG tablet Take 500 mg by mouth daily.     No current facility-administered medications for this visit.    Allergies:   Patient has no known allergies.   Social History:  The patient  reports that she has never smoked. She has never used smokeless tobacco. She reports that she does not drink alcohol and does not use drugs.   Family History:  The patient's family history includes Colon cancer (age of onset: 31) in her father; Diabetes in her father and mother; Healthy in her brother and brother; Hypertension in her father and mother; Other in her mother.  ROS:  Please see the history of present illness.   All other systems are reviewed and  otherwise negative.   PHYSICAL EXAM:  VS:  LMP 08/29/2005  BMI: There is no height or weight on file to calculate BMI. Well nourished, well developed, in no acute distress  HEENT: normocephalic, atraumatic  Neck: no JVD, carotid bruits or masses Cardiac:   RRR; no significant murmurs, no rubs, or gallops Lungs:   CTA b/l, no wheezing, rhonchi or rales  Abd: soft, nontender MS: no deformity or atrophy Ext:  no edema  Skin: warm and dry, no rash Neuro:  No gross deficits appreciated Psych: euthymic mood, full affect  PPM site is stable, no tethering or discomfort   EKG:  Done today and reviewed by myself  SR 73bpm, no significant changes from prior  PPM interrogation done today and reviewed by myself:  Battery and lead measurements are good No arrhythmias AP <1% VP 0.2%  Recent Labs: 04/13/2021: ALT 20; Hemoglobin 12.8; Platelets 267.0; TSH 1.07 06/06/2021: BUN 7; Creatinine, Ser 0.75; Potassium 4.4; Sodium 127  No results found for requested labs within last 365 days.   CrCl cannot be calculated (Patient's most recent lab result is older than the maximum 21 days allowed.).   Wt Readings from Last 3 Encounters:  11/15/21 180 lb 12.8 oz (82 kg)  05/04/21 181 lb 6.4 oz (82.3 kg)  04/13/21 181 lb 12.8 oz (82.5 kg)     Other studies reviewed: Additional studies/records reviewed today include: summarized above  ASSESSMENT AND PLAN:  1. PPM     Intact function, no changes made   2. HTN     Looks OK, no changes today     3. Pre-op hysterectomy     elevated cardiac risk surgery/intra-abdominal/pelvic,      Low cardiac risk score, though given we have no prior cardiac data, I think is reasonable to get a quick echo with an intra/abdominal/pelvic surgery     will need routine pacemaker management perioperatively      Await her echo, should be able to get it done in a timely manner  ADDEND: 11/24/21 Echo done, preserved LVEF with no WMA, mild-mod MR/TR RCRI score is 0.9%,  discussed with the patient yesterday Acceptable cardiac risk for the procedure planned. No cardiac contraindications to her surgery Routine peri-operative pacemaker management Tommye Standard, PA-C   Disposition: otherwise remotes as usual and in clinic with EP in a year, sooner if needed.    Current medicines are reviewed at length with the patient today.  The patient did not have any concerns regarding medicines.  Venetia Night, PA-C 11/22/2021 6:37 PM     [facility] [place]  [phone] (office)  [phone] (fax)

## 2021-11-23 ENCOUNTER — Ambulatory Visit (INDEPENDENT_AMBULATORY_CARE_PROVIDER_SITE_OTHER): Payer: Medicare Other | Admitting: Physician Assistant

## 2021-11-23 ENCOUNTER — Encounter: Payer: Self-pay | Admitting: Physician Assistant

## 2021-11-23 VITALS — BP 144/78 | HR 73 | Ht 66.0 in | Wt 179.4 lb

## 2021-11-23 DIAGNOSIS — I10 Essential (primary) hypertension: Secondary | ICD-10-CM | POA: Diagnosis not present

## 2021-11-23 DIAGNOSIS — Z950 Presence of cardiac pacemaker: Secondary | ICD-10-CM

## 2021-11-23 DIAGNOSIS — I495 Sick sinus syndrome: Secondary | ICD-10-CM

## 2021-11-23 DIAGNOSIS — Z01818 Encounter for other preprocedural examination: Secondary | ICD-10-CM

## 2021-11-23 LAB — CUP PACEART REMOTE DEVICE CHECK
Battery Impedance: 1515 Ohm
Battery Remaining Longevity: 55 mo
Battery Voltage: 2.76 V
Brady Statistic AP VP Percent: 0 %
Brady Statistic AP VS Percent: 0 %
Brady Statistic AS VP Percent: 0 %
Brady Statistic AS VS Percent: 100 %
Date Time Interrogation Session: 20230726124700
Implantable Lead Implant Date: 20120409
Implantable Lead Implant Date: 20120409
Implantable Lead Location: 753859
Implantable Lead Location: 753860
Implantable Lead Model: 4076
Implantable Lead Model: 4076
Implantable Pulse Generator Implant Date: 20120409
Lead Channel Impedance Value: 554 Ohm
Lead Channel Impedance Value: 621 Ohm
Lead Channel Pacing Threshold Amplitude: 0.625 V
Lead Channel Pacing Threshold Amplitude: 0.625 V
Lead Channel Pacing Threshold Pulse Width: 0.4 ms
Lead Channel Pacing Threshold Pulse Width: 0.4 ms
Lead Channel Setting Pacing Amplitude: 2 V
Lead Channel Setting Pacing Amplitude: 2.5 V
Lead Channel Setting Pacing Pulse Width: 0.4 ms
Lead Channel Setting Sensing Sensitivity: 5.6 mV

## 2021-11-23 NOTE — Patient Instructions (Addendum)
Medication Instructions:  Your physician recommends that you continue on your current medications as directed. Please refer to the Current Medication list given to you today.  Labwork: None ordered.  Testing/Procedures: Schedule STAT Echocardiogram for PRE-OP clearance.    Follow-Up:   Your physician wants you to follow-up in: 1  year follow up with Dr. Olin Pia. or one of the following Advanced Practice Providers on your designated Care Team:   Tommye Standard, [place] Legrand Como "Jonni Sanger" Chalmers Cater, [place]  Remote monitoring is used to monitor your Pacemaker from home. This monitoring reduces the number of office visits required to check your device to one time per year. It allows [place] to keep an eye on the functioning of your device to ensure it is working properly. You are scheduled for a device check from home on 02/21/22. You may send your transmission at any time that day. If you have a wireless device, the transmission will be sent automatically. After your physician reviews your transmission, you will receive a postcard with your next transmission date.  Any Other Special Instructions Will Be Listed Below (If Applicable).  If you need a refill on your cardiac medications before your next appointment, please call your pharmacy.   Important Information About Sugar

## 2021-11-24 ENCOUNTER — Ambulatory Visit (HOSPITAL_COMMUNITY): Payer: Medicare Other | Attending: Physician Assistant

## 2021-11-24 DIAGNOSIS — I10 Essential (primary) hypertension: Secondary | ICD-10-CM | POA: Insufficient documentation

## 2021-11-24 DIAGNOSIS — I495 Sick sinus syndrome: Secondary | ICD-10-CM | POA: Diagnosis not present

## 2021-11-24 DIAGNOSIS — Z950 Presence of cardiac pacemaker: Secondary | ICD-10-CM | POA: Diagnosis not present

## 2021-11-24 LAB — ECHOCARDIOGRAM COMPLETE
Area-P 1/2: 3.24 cm2
S' Lateral: 2.2 cm

## 2021-11-29 ENCOUNTER — Ambulatory Visit (INDEPENDENT_AMBULATORY_CARE_PROVIDER_SITE_OTHER): Payer: Medicare Other

## 2021-11-29 VITALS — BP 122/70 | HR 74 | Temp 97.9°F | Ht 62.0 in | Wt 180.0 lb

## 2021-11-29 DIAGNOSIS — Z0000 Encounter for general adult medical examination without abnormal findings: Secondary | ICD-10-CM | POA: Diagnosis not present

## 2021-11-29 NOTE — Patient Instructions (Signed)
[name] [name] , Thank you for taking time to come for your Medicare Wellness Visit. I appreciate your ongoing commitment to your health goals. Please review the following plan we discussed and let me know if I can assist you in the future.   Screening recommendations/referrals: Colonoscopy: completed 11/13/2016, due 11/13/2021 Mammogram: scheduled for 02/07/2022 Bone Density: scheduled for 02/07/2022 Recommended yearly ophthalmology/optometry visit for glaucoma screening and checkup Recommended yearly dental visit for hygiene and checkup  Vaccinations: Influenza vaccine: due Pneumococcal vaccine: completed 04/13/2021 Tdap vaccine: due Shingles vaccine: completed   Covid-19: 02/10/2020, 08/05/2019, 07/12/2019  Advanced directives: Advance directive discussed with you today. I have provided a copy for you to complete at home and have notarized. Once this is complete please bring a copy in to our office so we can scan it into your chart.  Conditions/risks identified: none  Next appointment: Follow up in one year for your annual wellness visit    Preventive Care 65 Years and Older, Female Preventive care refers to lifestyle choices and visits with your health care provider that can promote health and wellness. What does preventive care include? A yearly physical exam. This is also called an annual well check. Dental exams once or twice a year. Routine eye exams. Ask your health care provider how often you should have your eyes checked. Personal lifestyle choices, including: Daily care of your teeth and gums. Regular physical activity. Eating a healthy diet. Avoiding tobacco and drug use. Limiting alcohol use. Practicing safe sex. Taking low-dose aspirin every day. Taking vitamin and mineral supplements as recommended by your health care provider. What happens during an annual well check? The services and screenings done by your health care provider during your annual well check will depend  on your age, overall health, lifestyle risk factors, and family history of disease. Counseling  Your health care provider may ask you questions about your: Alcohol use. Tobacco use. Drug use. Emotional well-being. Home and relationship well-being. Sexual activity. Eating habits. History of falls. Memory and ability to understand (cognition). Work and work Statistician. Reproductive health. Screening  You may have the following tests or measurements: Height, weight, and BMI. Blood pressure. Lipid and cholesterol levels. These may be checked every 5 years, or more frequently if you are over 69 years old. Skin check. Lung cancer screening. You may have this screening every year starting at age 32 if you have a 30-pack-year history of smoking and currently smoke or have quit within the past 15 years. Fecal occult blood test (FOBT) of the stool. You may have this test every year starting at age 65. Flexible sigmoidoscopy or colonoscopy. You may have a sigmoidoscopy every 5 years or a colonoscopy every 10 years starting at age 64. Hepatitis C blood test. Hepatitis B blood test. Sexually transmitted disease (STD) testing. Diabetes screening. This is done by checking your blood sugar (glucose) after you have not eaten for a while (fasting). You may have this done every 1-3 years. Bone density scan. This is done to screen for osteoporosis. You may have this done starting at age 5. Mammogram. This may be done every 1-2 years. Talk to your health care provider about how often you should have regular mammograms. Talk with your health care provider about your test results, treatment options, and if necessary, the need for more tests. Vaccines  Your health care provider may recommend certain vaccines, such as: Influenza vaccine. This is recommended every year. Tetanus, diphtheria, and acellular pertussis (Tdap, Td) vaccine. You may need  a Td booster every 10 years. Zoster vaccine. You may need  this after age 34. Pneumococcal 13-valent conjugate (PCV13) vaccine. One dose is recommended after age 30. Pneumococcal polysaccharide (PPSV23) vaccine. One dose is recommended after age 57. Talk to your health care provider about which screenings and vaccines you need and how often you need them. This information is not intended to replace advice given to you by your health care provider. Make sure you discuss any questions you have with your health care provider. Document Released: 05/14/2015 Document Revised: 01/05/2016 Document Reviewed: 02/16/2015 Elsevier Interactive Patient Education  2017 [employer] Prevention in the Home Falls can cause injuries. They can happen to people of all ages. There are many things you can do to make your home safe and to help prevent falls. What can I do on the outside of my home? Regularly fix the edges of walkways and driveways and fix any cracks. Remove anything that might make you trip as you walk through a door, such as a raised step or threshold. Trim any bushes or trees on the path to your home. Use bright outdoor lighting. Clear any walking paths of anything that might make someone trip, such as rocks or tools. Regularly check to see if handrails are loose or broken. Make sure that both sides of any steps have handrails. Any raised decks and porches should have guardrails on the edges. Have any leaves, snow, or ice cleared regularly. Use sand or salt on walking paths during winter. Clean up any spills in your garage right away. This includes oil or grease spills. What can I do in the bathroom? Use night lights. Install grab bars by the toilet and in the tub and shower. Do not use towel bars as grab bars. Use non-skid mats or decals in the tub or shower. If you need to sit down in the shower, use a plastic, non-slip stool. Keep the floor dry. Clean up any water that spills on the floor as soon as it happens. Remove soap buildup in the tub  or shower regularly. Attach bath mats securely with double-sided non-slip rug tape. Do not have throw rugs and other things on the floor that can make you trip. What can I do in the bedroom? Use night lights. Make sure that you have a light by your bed that is easy to reach. Do not use any sheets or blankets that are too big for your bed. They should not hang down onto the floor. Have a firm chair that has side arms. You can use this for support while you get dressed. Do not have throw rugs and other things on the floor that can make you trip. What can I do in the kitchen? Clean up any spills right away. Avoid walking on wet floors. Keep items that you use a lot in easy-to-reach places. If you need to reach something above you, use a strong step stool that has a grab bar. Keep electrical cords out of the way. Do not use floor polish or wax that makes floors slippery. If you must use wax, use non-skid floor wax. Do not have throw rugs and other things on the floor that can make you trip. What can I do with my stairs? Do not leave any items on the stairs. Make sure that there are handrails on both sides of the stairs and use them. Fix handrails that are broken or loose. Make sure that handrails are as long as the stairways. Check  any carpeting to make sure that it is firmly attached to the stairs. Fix any carpet that is loose or worn. Avoid having throw rugs at the top or bottom of the stairs. If you do have throw rugs, attach them to the floor with carpet tape. Make sure that you have a light switch at the top of the stairs and the bottom of the stairs. If you do not have them, ask someone to add them for you. What else can I do to help prevent falls? Wear shoes that: Do not have high heels. Have rubber bottoms. Are comfortable and fit you well. Are closed at the toe. Do not wear sandals. If you use a stepladder: Make sure that it is fully opened. Do not climb a closed stepladder. Make  sure that both sides of the stepladder are locked into place. Ask someone to hold it for you, if possible. Clearly mark and make sure that you can see: Any grab bars or handrails. First and last steps. Where the edge of each step is. Use tools that help you move around (mobility aids) if they are needed. These include: Canes. Walkers. Scooters. Crutches. Turn on the lights when you go into a dark area. Replace any light bulbs as soon as they burn out. Set up your furniture so you have a clear path. Avoid moving your furniture around. If any of your floors are uneven, fix them. If there are any pets around you, be aware of where they are. Review your medicines with your doctor. Some medicines can make you feel dizzy. This can increase your chance of falling. Ask your doctor what other things that you can do to help prevent falls. This information is not intended to replace advice given to you by your health care provider. Make sure you discuss any questions you have with your health care provider. Document Released: 02/11/2009 Document Revised: 09/23/2015 Document Reviewed: 05/22/2014 Elsevier Interactive Patient Education  2017 [employer].

## 2021-11-29 NOTE — Progress Notes (Addendum)
Subjective:   [name] [name] is a 66 y.o. female who presents for Medicare Annual (Subsequent) preventive examination.  Review of Systems     Cardiac Risk Factors include: advanced age (>90mn, >>57women);hypertension;obesity (BMI >30kg/m2)     Objective:    Today's Vitals   11/29/21 1428  BP: 122/70  Pulse: 74  Temp: 97.9 F (36.6 C)  TempSrc: Oral  SpO2: 96%  Weight: 180 lb (81.6 kg)  Height: '5\' 2"'$  (1.575 m)   Body mass index is 32.92 kg/m.     11/29/2021    2:42 PM 04/28/2021    8:53 PM 11/16/2020    2:43 PM 08/26/2020    9:33 AM 07/08/2020    2:21 PM 02/24/2020    1:32 PM 02/24/2019   10:40 AM  Advanced Directives  Does Patient Have a Medical Advance Directive? No No No No No Yes No  Type of ATeacher, early years/preOut of facility DNR (pink MOST or yellow form);Living will   Would patient like information on creating a medical advance directive? Yes (MAU/Ambulatory/Procedural Areas - Information given) No - Patient declined Yes (MAU/Ambulatory/Procedural Areas - Information given) No - Patient declined   No - Patient declined    Current Medications (verified) Outpatient Encounter Medications as of 11/29/2021  Medication Sig   cholecalciferol (VITAMIN D3) 25 MCG (1000 UNIT) tablet Take 1,000 Units by mouth 2 (two) times daily.   folic acid (FOLVITE) 1 MG tablet Take 1 mg by mouth daily.   LamoTRIgine 300 MG TB24 24 hour tablet Take 1 tablet by mouth twice daily   levETIRAcetam (KEPPRA) 500 MG tablet Take 1 tablet (500 mg total) by mouth 2 (two) times daily.   Magnesium 400 MG CAPS Take 400 mg by mouth daily.   Multiple Vitamin (MULTIVITAMIN WITH MINERALS) TABS tablet Take 2 tablets by mouth daily.   vitamin C (ASCORBIC ACID) 500 MG tablet Take 500 mg by mouth daily.   No facility-administered encounter medications on file as of 11/29/2021.    Allergies (verified) Patient has no known allergies.   History: Past Medical History:  Diagnosis  Date   Chicken pox    Conductive hearing loss, childhood onset    Deafness    from medication a a child for chicken pox   Epilepsy, followed by Dr. STomma Rakersin neurology 08/26/2012   Hx of adenomatous colonic polyps 11/19/2016   Pacemaker    Pacemaker    2012   Seizure ([place]    Seizures ([facility]    [place]    Past Surgical History:  Procedure Laterality Date   HIP ARTHROPLASTY     PACEMAKER INSERTION  07/2010   TOTAL HIP ARTHROPLASTY  07/2011   right   TOTAL HIP ARTHROPLASTY  03/2012   left   TOTAL SHOULDER ARTHROPLASTY Right 07/15/2020   Procedure: TOTAL SHOULDER ARTHROPLASTY;  Surgeon: CTania Ade MD;  Location: [facility];  Service: Orthopedics;  Laterality: Right;  NEED 140MINUTES   Family History  Problem Relation Age of Onset   Diabetes Father    Hypertension Father    Colon cancer Father 744  Hypertension Mother    Other Mother        skin lesions   Diabetes Mother        may be borderline   Healthy Brother    Healthy Brother    Esophageal cancer Neg Hx    Rectal cancer Neg Hx    Stomach cancer  Neg Hx    Social History   Socioeconomic History   Marital status: Divorced    Spouse name: Not on file   Number of children: 2   Years of education: Not on file   Highest education level: High school graduate  Occupational History   Not on file  Tobacco Use   Smoking status: Never   Smokeless tobacco: Never  Vaping Use   Vaping Use: Never used  Substance and Sexual Activity   Alcohol use: No    Alcohol/week: 0.0 standard drinks of alcohol    Comment: none   Drug use: No   Sexual activity: Never  Other Topics Concern   Not on file  Social History Narrative   ** Merged History Encounter **       Work or School: on disability for the deafness, seizure and carpal tunnel      Home Situation: lives with mother and father currently - since 06/2012      Spiritual Beliefs: Christian      Lifestyle: walks twice daily      Right handed      Social Determinants  of Health   Financial Resource Strain: Low Risk  (11/29/2021)   Overall Financial Resource Strain (CARDIA)    Difficulty of Paying Living Expenses: Not hard at all  Food Insecurity: No Food Insecurity (11/29/2021)   Hunger Vital Sign    Worried About Running Out of Food in the Last Year: Never true    [place] in the Last Year: Never true  Transportation Needs: No Transportation Needs (11/29/2021)   PRAPARE - Hydrologist (Medical): No    Lack of Transportation (Non-Medical): No  Physical Activity: Insufficiently Active (11/29/2021)   Exercise Vital Sign    Days of Exercise per Week: 3 days    Minutes of Exercise per Session: 30 min  Stress: No Stress Concern Present (11/29/2021)   [place]    Feeling of Stress : Only a little  Social Connections: Socially Isolated (11/16/2020)   Social Connection and Isolation Panel [NHANES]    Frequency of Communication with Friends and Family: Once a week    Frequency of Social Gatherings with Friends and Family: Never    Attends Religious Services: Never    Printmaker: No    Attends Music therapist: Never    Marital Status: Divorced    Tobacco Counseling Counseling given: Not Answered   Clinical Intake:  Pre-visit preparation completed: Yes  Pain : No/denies pain     Nutritional Status: BMI > 30  Obese Nutritional Risks: None  How often do you need to have someone help you when you read instructions, pamphlets, or other written materials from your doctor or pharmacy?: 1 - Never What is the last grade level you completed in school?: 12th grade  Diabetic?no  Interpreter Needed?: Yes Interpreter Agency: [place] Interpreter Name: Linus Mako  Information entered by :: NAllen LPN   Activities of Daily Living    11/29/2021    2:48 PM  In your present state of health, do you have any difficulty  performing the following activities:  Hearing? 1  Vision? 1  Comment some trouble with peripheral vision at times  Difficulty concentrating or making decisions? 0  Walking or climbing stairs? 0  Dressing or bathing? 0  Doing errands, shopping? 1  Preparing Food and eating ? N  Using  the Toilet? N  In the past six months, have you accidently leaked urine? N  Do you have problems with loss of bowel control? N  Managing your Medications? N  Managing your Finances? N  Housekeeping or managing your Housekeeping? N    Patient Care Team: [name], MD (Inactive) as PCP - General (Family Medicine) Aloha Gell, MD as Consulting Physician (Obstetrics and Gynecology) Pieter Partridge, DO as Consulting Physician (Neurology) Viona Gilmore, [facility] as Pharmacist (Pharmacist)  Indicate any recent Medical Services you may have received from other than [facility] providers in the past year (date may be approximate).     Assessment:   This is a routine wellness examination for Jhane.  Hearing/Vision screen Vision Screening - Comments:: Regular eye exams,   Dietary issues and exercise activities discussed: Current Exercise Habits: Home exercise routine, Type of exercise: walking, Time (Minutes): 30, Frequency (Times/Week): 3, Weekly Exercise (Minutes/Week): 90   Goals Addressed             This Visit's Progress    Patient Stated       11/29/2021, stay healthy       Depression Screen    11/29/2021    2:47 PM 04/13/2021    2:44 PM 11/16/2020    2:41 PM 01/09/2020    9:01 AM 01/08/2019   10:02 AM 08/30/2017    1:33 PM 08/20/2017   11:00 AM  PHQ 2/9 Scores  PHQ - 2 Score 0 1 1 0 0 0 0  PHQ- 9 Score  2         Fall Risk    11/29/2021    2:46 PM 11/14/2021    1:40 PM 04/13/2021    2:37 PM 11/16/2020    2:45 PM 02/24/2020    1:31 PM  Fall Risk   Falls in the past year? 1 0 1 1 0  Comment passed out      Number falls in past yr: 1 0 1 1 0  Injury with Fall? 0 0 1 0 0  Risk for  fall due to : Medication side effect   Impaired vision;Impaired balance/gait;Impaired mobility   Follow up Falls evaluation completed;Education provided;Falls prevention discussed   Falls prevention discussed     FALL RISK PREVENTION PERTAINING TO THE HOME:  Any stairs in or around the home? Yes  If so, are there any without handrails? No  Home free of loose throw rugs in walkways, pet beds, electrical cords, etc? Yes  Adequate lighting in your home to reduce risk of falls? Yes   ASSISTIVE DEVICES UTILIZED TO PREVENT FALLS:  Life alert? No  Use of a cane, walker or w/c? No  Grab bars in the bathroom? No  Shower chair or bench in shower? No  Elevated toilet seat or a handicapped toilet? No   TIMED UP AND GO:  Was the test performed? No .      Cognitive Function:        11/16/2020    2:49 PM  6CIT Screen  What Year? 0 points  What month? 0 points  What time? 0 points  Count back from 20 0 points  Months in reverse 0 points  Repeat phrase 0 points  Total Score 0 points    Immunizations Immunization History  Administered Date(s) Administered   Influenza Inj Mdck Quad Pf 01/31/2018   Influenza Split 01/29/2013, 01/20/2014   Influenza,inj,Quad PF,6+ Mos 02/04/2015, 01/19/2016, 01/15/2017, 01/02/2019, 01/09/2020   Influenza-Unspecified 01/29/2018, 12/30/2020  PFIZER Comirnaty(Gray Top)Covid-19 Tri-Sucrose Vaccine 07/12/2019, 08/05/2019, 02/10/2020   PNEUMOCOCCAL CONJUGATE-20 04/13/2021   Td 09/30/2011   Zoster Recombinat (Shingrix) 04/02/2019, 01/09/2020    TDAP status: Due, Education has been provided regarding the importance of this vaccine. Advised may receive this vaccine at local pharmacy or Health Dept. Aware to provide a copy of the vaccination record if obtained from local pharmacy or Health Dept. Verbalized acceptance and understanding.  Flu Vaccine status: Up to date  Pneumococcal vaccine status: Up to date  Covid-19 vaccine status: Completed  vaccines  Qualifies for Shingles Vaccine? Yes   Zostavax completed Yes   Shingrix Completed?: Yes  Screening Tests Health Maintenance  Topic Date Due   HIV Screening  Never done   COVID-19 Vaccine (4 - Booster for Pfizer series) 04/06/2020   PAP SMEAR-Modifier  08/20/2020   MAMMOGRAM  12/18/2020   DEXA SCAN  Never done   TETANUS/TDAP  09/29/2021   COLONOSCOPY (Pts 45-31yr Insurance coverage will need to be confirmed)  11/13/2021   INFLUENZA VACCINE  11/29/2021   Pneumonia Vaccine 66 Years old  Completed   Hepatitis C Screening  Completed   Zoster Vaccines- Shingrix  Completed   HPV VACCINES  Aged Out    Health Maintenance  Health Maintenance Due  Topic Date Due   HIV Screening  Never done   COVID-19 Vaccine (4 - Booster for Pfizer series) 04/06/2020   PAP SMEAR-Modifier  08/20/2020   MAMMOGRAM  12/18/2020   DEXA SCAN  Never done   TETANUS/TDAP  09/29/2021   COLONOSCOPY (Pts 45-467yrInsurance coverage will need to be confirmed)  11/13/2021   INFLUENZA VACCINE  11/29/2021    Colorectal cancer screening: Type of screening: Colonoscopy. Completed 11/13/2016. Repeat every 10 years  Mammogram status: scheduled for 02/07/2022  Bone Density status: scheduled 02/07/2022  Lung Cancer Screening: (Low Dose CT Chest recommended if Age 760-80ears, 30 pack-year currently smoking OR have quit w/in 15years.) does not qualify.   Lung Cancer Screening Referral: no  Additional Screening:  Hepatitis C Screening: does qualify; Completed 08/20/2017  Vision Screening: Recommended annual ophthalmology exams for early detection of glaucoma and other disorders of the eye. Is the patient up to date with their annual eye exam?  Yes  Who is the provider or what is the name of the office in which the patient attends annual eye exams? Can't remember name If pt is not established with a provider, would they like to be referred to a provider to establish care? No .   Dental Screening:  Recommended annual dental exams for proper oral hygiene  Community Resource Referral / Chronic Care Management: CRR required this visit?  No   CCM required this visit?  No      Plan:     I have personally reviewed and noted the following in the patient's chart:   Medical and social history Use of alcohol, tobacco or illicit drugs  Current medications and supplements including opioid prescriptions.  Functional ability and status Nutritional status Physical activity Advanced directives List of other physicians Hospitalizations, surgeries, and ER visits in previous 12 months Vitals Screenings to include cognitive, depression, and falls Referrals and appointments  In addition, I have reviewed and discussed with patient certain preventive protocols, quality metrics, and best practice recommendations. A written personalized care plan for preventive services as well as general preventive health recommendations were provided to patient.     NiKellie SimmeringLPN   8/05/06/1094 Nurse Notes:  6 CIT  not administered. Patient is deaf

## 2021-11-30 ENCOUNTER — Encounter: Payer: Self-pay | Admitting: Internal Medicine

## 2021-11-30 ENCOUNTER — Other Ambulatory Visit: Payer: Self-pay

## 2021-11-30 ENCOUNTER — Other Ambulatory Visit: Payer: Self-pay | Admitting: Obstetrics

## 2021-11-30 ENCOUNTER — Encounter (HOSPITAL_BASED_OUTPATIENT_CLINIC_OR_DEPARTMENT_OTHER): Payer: Self-pay | Admitting: Obstetrics

## 2021-11-30 NOTE — Progress Notes (Signed)
Your procedure is scheduled on Thursday, 12/15/21.  Report to [facility].   Call this number if you have problems the morning of surgery  :[phone].   OUR ADDRESS IS [place].  WE ARE LOCATED IN [facility].  PLEASE BRING YOUR INSURANCE CARD AND PHOTO ID DAY OF SURGERY.  ONLY 2 PEOPLE ARE ALLOWED IN  WAITING  ROOM.                                      REMEMBER:  DO NOT EAT FOOD, CANDY GUM OR MINTS  AFTER MIDNIGHT THE NIGHT BEFORE YOUR SURGERY . YOU MAY HAVE CLEAR LIQUIDS FROM MIDNIGHT THE NIGHT BEFORE YOUR SURGERY UNTIL 10:30 AM NO CLEAR LIQUIDS AFTER   10:30 AM DAY OF SURGERY.  YOU MAY  BRUSH YOUR TEETH MORNING OF SURGERY AND RINSE YOUR MOUTH OUT, NO CHEWING GUM CANDY OR MINTS.     CLEAR LIQUID DIET   Foods Allowed                                                                     Foods Excluded  Coffee and tea, regular and decaf                             liquids that you cannot  Plain Jell-O                                                                   see through such as: Fruit ices (not with fruit pulp)                                     milk, soups, orange juice  Plain  Popsicles                                    All solid food Carbonated beverages, regular and diet                                    Cranberry, grape and apple juices Sports drinks like Gatorade _____________________________________________________________________     TAKE THESE MEDICATIONS MORNING OF SURGERY:  Lamotrigine, Keppra    UP TO 4 VISITORS  MAY VISIT IN THE EXTENDED RECOVERY ROOM UNTIL 800 PM ONLY.  ONE  VISITOR AGE 66 AND OVER MAY SPEND THE NIGHT AND MUST BE IN EXTENDED RECOVERY ROOM NO LATER THAN 800 PM . YOUR DISCHARGE TIME AFTER YOU SPEND THE NIGHT IS 900 AM THE MORNING AFTER YOUR SURGERY.  YOU MAY PACK A SMALL OVERNIGHT BAG WITH TOILETRIES FOR YOUR OVERNIGHT STAY  IF YOU WISH.  YOUR PRESCRIPTION MEDICATIONS WILL  BE PROVIDED DURING [place].                                      DO NOT WEAR JEWERLY, MAKE UP. DO NOT WEAR LOTIONS, POWDERS, PERFUMES OR NAIL POLISH ON YOUR FINGERNAILS. TOENAIL POLISH IS OK TO WEAR. DO NOT SHAVE FOR 48 HOURS PRIOR TO DAY OF SURGERY. MEN MAY SHAVE FACE AND NECK. CONTACTS, GLASSES, OR DENTURES MAY NOT BE WORN TO SURGERY.  REMEMBER: NO SMOKING, DRUGS OR ALCOHOL FOR 24 HOURS BEFORE YOUR SURGERY.                                    [facility] IS NOT RESPONSIBLE  FOR ANY BELONGINGS.                                                                    Marland Kitchen           [place] [facility] - Preparing for Surgery Before surgery, you can play an important role.  Because skin is not sterile, your skin needs to be as free of germs as possible.  You can reduce the number of germs on your skin by washing with CHG (chlorahexidine gluconate) soap before surgery.  CHG is an antiseptic cleaner which kills germs and bonds with the skin to continue killing germs even after washing. Please DO NOT use if you have an allergy to CHG or antibacterial soaps.  If your skin becomes reddened/irritated stop using the CHG and inform your nurse when you arrive at Short Stay. Do not shave (including legs and underarms) for at least 48 hours prior to the first CHG shower.  You may shave your face/neck. Please follow these instructions carefully:  1.  Shower with CHG Soap the night before surgery and the  morning of Surgery.  2.  If you choose to wash your hair, wash your hair first as usual with your  normal  shampoo.  3.  After you shampoo, rinse your hair and body thoroughly to remove the  shampoo.                            4.  Use CHG as you would any other liquid soap.  You can apply chg directly  to the skin and wash , please wash your belly button thoroughly with chg soap provided night before and morning of your surgery.                     Gently with a scrungie or clean washcloth.  5.  Apply the CHG  Soap to your body ONLY FROM THE NECK DOWN.   Do not use on face/ open                           Wound or open sores. Avoid contact with eyes, ears mouth and genitals (private parts).  Wash face,  Genitals (private parts) with your normal soap.             6.  Wash thoroughly, paying special attention to the area where your surgery  will be performed.  7.  Thoroughly rinse your body with warm water from the neck down.  8.  DO NOT shower/wash with your normal soap after using and rinsing off  the CHG Soap.                9.  Pat yourself dry with a clean towel.            10.  Wear clean pajamas.            11.  Place clean sheets on your bed the night of your first shower and do not  sleep with pets. Day of Surgery : Do not apply any lotions/deodorants the morning of surgery.  Please wear clean clothes to the hospital/surgery center.  IF YOU HAVE ANY SKIN IRRITATION OR PROBLEMS WITH THE SURGICAL SOAP, PLEASE GET A BAR OF GOLD DIAL SOAP AND SHOWER THE NIGHT BEFORE YOUR SURGERY AND THE MORNING OF YOUR SURGERY. PLEASE LET THE NURSE KNOW MORNING OF YOUR SURGERY IF YOU HAD ANY PROBLEMS WITH THE SURGICAL SOAP.   ________________________________________________________________________                                                        QUESTIONS [facility] PRE OP NURSE PHONE [phone].

## 2021-11-30 NOTE — Progress Notes (Signed)
Spoke w/ via phone for pre-op interview---Patient's mother, Blanch Media Lab needs dos---- none per anesthesia, surgeon orders pending as of 11/30/21            Lab results------12/12/21 lab appt for cbc, type & screen, bmp, 11/23/21 EKG in chart & Epic COVID test -----patient states asymptomatic no test needed Arrive at -------1130 on Thursday, 12/15/21 NPO after MN NO Solid Food.  Clear liquids from MN until---1030 Med rec completed Medications to take morning of surgery -----Lamotrigine, Keppra Diabetic medication -----n/a Patient instructed no nail polish to be worn day of surgery Patient instructed to bring photo id and insurance card day of surgery Patient aware to have Driver (ride ) / caregiver    for 24 hours after surgery - daughter Aldona Bar, driver and mother Blanch Media, caregiver Patient Special Instructions -----Extended / overnight stay in instructions given. Pre-Op special Istructions -----Patient is deaf. Sign language interpreter requested for day of surgery. Requested orders from Dr. Pamala Hurry via Epic IB on 11/30/21. Patient verbalized understanding of instructions that were given at this phone interview. Patient denies shortness of breath, chest pain, fever, cough at this phone interview.   Patient has a pacemaker and follows with cardiology, [place] 11/22/21 with Tommye Standard, PA-C in [place]. Ms. Charlcie Cradle gave cardiac clearance - copy in chart. 11/23/21 EKG in chart & Epic. 11/24/21 Echocardiogram in Epic - EF 60 - 65%. Pacemaker device instructions dated 11/30/21 in chart.  Patient has a history of seizures. She follows with Dr. [name], [place] 11/14/21 in [place]. Last known seizure activity was 05/31/20 per patient's mother. Patient is currently taking Lamotrigine and Keppra. 06/13/21 48-hour ambulatory EEG in Epic - normal EEG.

## 2021-11-30 NOTE — Progress Notes (Signed)
[facility]GRAMM[facility]  Patient Information: Name:  [name] [name]  DOB:  28-Mar-1956  MRN:  [id number]    Planned Procedure:  XI Robotic Assisted Hysterectomy with bilateral salpingectomy and oophorectomy  Surgeon:  Dr. Aloha Gell  Date of Procedure:  12/15/21  Cautery will be used.  Position during surgery:  lithotomy   Please send documentation back to:  [facility] (Fax # [phone] Device Information:  Clinic EP Physician:  Virl Axe, MD   Device Type:  Pacemaker Manufacturer and Phone #:  [employer]: [phone] Pacemaker Dependent?:  No. Date of Last Device Check:  11/23/2021 Normal Device Function?:  Yes.    Electrophysiologist's Recommendations:  Have magnet available. Provide continuous ECG monitoring when magnet is used or reprogramming is to be performed.  Procedure should not interfere with device function.  No device programming or magnet placement needed.  Per Device Clinic Standing Orders, Wanda Plump, RN  1:13 PM 11/30/2021

## 2021-12-04 ENCOUNTER — Other Ambulatory Visit: Payer: Self-pay | Admitting: Neurology

## 2021-12-04 DIAGNOSIS — G40009 Localization-related (focal) (partial) idiopathic epilepsy and epileptic syndromes with seizures of localized onset, not intractable, without status epilepticus: Secondary | ICD-10-CM

## 2021-12-05 ENCOUNTER — Other Ambulatory Visit: Payer: Self-pay

## 2021-12-05 MED ORDER — LAMOTRIGINE ER 300 MG PO TB24: 1.0000 | ORAL_TABLET | Freq: Two times a day (BID) | ORAL | 2 refills | Status: DC

## 2021-12-12 ENCOUNTER — Encounter (HOSPITAL_COMMUNITY)
Admission: RE | Admit: 2021-12-12 | Discharge: 2021-12-12 | Disposition: A | Discharge status: Home / Self Care | Payer: Medicare Other | Source: Ambulatory Visit | Attending: Obstetrics | Admitting: Obstetrics

## 2021-12-12 DIAGNOSIS — E559 Vitamin D deficiency, unspecified: Secondary | ICD-10-CM | POA: Diagnosis not present

## 2021-12-12 DIAGNOSIS — H9190 Unspecified hearing loss, unspecified ear: Secondary | ICD-10-CM | POA: Diagnosis not present

## 2021-12-12 DIAGNOSIS — Z950 Presence of cardiac pacemaker: Secondary | ICD-10-CM | POA: Diagnosis not present

## 2021-12-12 DIAGNOSIS — N952 Postmenopausal atrophic vaginitis: Secondary | ICD-10-CM | POA: Diagnosis present

## 2021-12-12 DIAGNOSIS — Z86010 Personal history of colonic polyps: Secondary | ICD-10-CM | POA: Diagnosis not present

## 2021-12-12 DIAGNOSIS — Z86001 Personal history of in-situ neoplasm of cervix uteri: Secondary | ICD-10-CM | POA: Diagnosis not present

## 2021-12-12 DIAGNOSIS — Z79899 Other long term (current) drug therapy: Secondary | ICD-10-CM | POA: Diagnosis not present

## 2021-12-12 DIAGNOSIS — Z90710 Acquired absence of both cervix and uterus: Secondary | ICD-10-CM | POA: Diagnosis not present

## 2021-12-12 DIAGNOSIS — D069 Carcinoma in situ of cervix, unspecified: Secondary | ICD-10-CM | POA: Diagnosis present

## 2021-12-12 DIAGNOSIS — G40909 Epilepsy, unspecified, not intractable, without status epilepticus: Secondary | ICD-10-CM | POA: Diagnosis present

## 2021-12-12 DIAGNOSIS — Z5331 Laparoscopic surgical procedure converted to open procedure: Secondary | ICD-10-CM | POA: Diagnosis not present

## 2021-12-12 DIAGNOSIS — M4802 Spinal stenosis, cervical region: Secondary | ICD-10-CM | POA: Diagnosis not present

## 2021-12-12 DIAGNOSIS — Z01812 Encounter for preprocedural laboratory examination: Secondary | ICD-10-CM | POA: Insufficient documentation

## 2021-12-12 DIAGNOSIS — E538 Deficiency of other specified B group vitamins: Secondary | ICD-10-CM | POA: Diagnosis not present

## 2021-12-12 DIAGNOSIS — I10 Essential (primary) hypertension: Secondary | ICD-10-CM | POA: Insufficient documentation

## 2021-12-12 DIAGNOSIS — Z96611 Presence of right artificial shoulder joint: Secondary | ICD-10-CM | POA: Diagnosis not present

## 2021-12-12 DIAGNOSIS — Z9851 Tubal ligation status: Secondary | ICD-10-CM | POA: Diagnosis not present

## 2021-12-12 DIAGNOSIS — B977 Papillomavirus as the cause of diseases classified elsewhere: Secondary | ICD-10-CM | POA: Diagnosis not present

## 2021-12-12 DIAGNOSIS — Z96643 Presence of artificial hip joint, bilateral: Secondary | ICD-10-CM | POA: Diagnosis not present

## 2021-12-12 LAB — CBC
HCT: 40.6 % (ref 36.0–46.0)
Hemoglobin: 13.4 g/dL (ref 12.0–15.0)
MCH: 30 pg (ref 26.0–34.0)
MCHC: 33 g/dL (ref 30.0–36.0)
MCV: 91 fL (ref 80.0–100.0)
Platelets: 244 10*3/uL (ref 150–400)
RBC: 4.46 MIL/uL (ref 3.87–5.11)
RDW: 14.6 % (ref 11.5–15.5)
WBC: 6.3 10*3/uL (ref 4.0–10.5)
nRBC: 0 % (ref 0.0–0.2)

## 2021-12-12 LAB — BASIC METABOLIC PANEL
Anion gap: 7 (ref 5–15)
BUN: 11 mg/dL (ref 8–23)
CO2: 25 mmol/L (ref 22–32)
Calcium: 9.7 mg/dL (ref 8.9–10.3)
Chloride: 104 mmol/L (ref 98–111)
Creatinine, Ser: 0.99 mg/dL (ref 0.44–1.00)
GFR, Estimated: >60 mL/min (ref >60)
Glucose, Bld: 92 mg/dL (ref 70–99)
Potassium: 4.6 mmol/L (ref 3.5–5.1)
Sodium: 136 mmol/L (ref 135–145)

## 2021-12-15 ENCOUNTER — Other Ambulatory Visit: Payer: Self-pay

## 2021-12-15 ENCOUNTER — Encounter (HOSPITAL_COMMUNITY)
Admission: AD | Disposition: A | Discharge status: Home / Self Care | Payer: Self-pay | Source: Home / Self Care | Attending: Obstetrics

## 2021-12-15 ENCOUNTER — Ambulatory Visit (HOSPITAL_BASED_OUTPATIENT_CLINIC_OR_DEPARTMENT_OTHER): Payer: Medicare Other | Admitting: Certified Registered Nurse Anesthetist

## 2021-12-15 ENCOUNTER — Inpatient Hospital Stay (HOSPITAL_BASED_OUTPATIENT_CLINIC_OR_DEPARTMENT_OTHER)
Admission: AD | Admit: 2021-12-15 | Discharge: 2021-12-17 | DRG: 741 | Disposition: A | Discharge status: Home / Self Care | Payer: Medicare Other | Attending: Obstetrics | Admitting: Obstetrics

## 2021-12-15 ENCOUNTER — Encounter (HOSPITAL_BASED_OUTPATIENT_CLINIC_OR_DEPARTMENT_OTHER): Payer: Self-pay | Admitting: Obstetrics

## 2021-12-15 DIAGNOSIS — G40909 Epilepsy, unspecified, not intractable, without status epilepticus: Secondary | ICD-10-CM | POA: Diagnosis present

## 2021-12-15 DIAGNOSIS — E559 Vitamin D deficiency, unspecified: Secondary | ICD-10-CM | POA: Diagnosis present

## 2021-12-15 DIAGNOSIS — Z79899 Other long term (current) drug therapy: Secondary | ICD-10-CM

## 2021-12-15 DIAGNOSIS — B977 Papillomavirus as the cause of diseases classified elsewhere: Secondary | ICD-10-CM | POA: Diagnosis not present

## 2021-12-15 DIAGNOSIS — M4802 Spinal stenosis, cervical region: Secondary | ICD-10-CM | POA: Diagnosis present

## 2021-12-15 DIAGNOSIS — H9190 Unspecified hearing loss, unspecified ear: Secondary | ICD-10-CM | POA: Diagnosis present

## 2021-12-15 DIAGNOSIS — D069 Carcinoma in situ of cervix, unspecified: Principal | ICD-10-CM | POA: Diagnosis present

## 2021-12-15 DIAGNOSIS — Z950 Presence of cardiac pacemaker: Secondary | ICD-10-CM | POA: Diagnosis not present

## 2021-12-15 DIAGNOSIS — Z96611 Presence of right artificial shoulder joint: Secondary | ICD-10-CM | POA: Diagnosis present

## 2021-12-15 DIAGNOSIS — Z9851 Tubal ligation status: Secondary | ICD-10-CM | POA: Diagnosis not present

## 2021-12-15 DIAGNOSIS — Z86010 Personal history of colonic polyps: Secondary | ICD-10-CM

## 2021-12-15 DIAGNOSIS — Z96643 Presence of artificial hip joint, bilateral: Secondary | ICD-10-CM | POA: Diagnosis present

## 2021-12-15 DIAGNOSIS — E538 Deficiency of other specified B group vitamins: Secondary | ICD-10-CM | POA: Diagnosis present

## 2021-12-15 DIAGNOSIS — Z01818 Encounter for other preprocedural examination: Principal | ICD-10-CM

## 2021-12-15 DIAGNOSIS — Z90710 Acquired absence of both cervix and uterus: Secondary | ICD-10-CM

## 2021-12-15 DIAGNOSIS — N952 Postmenopausal atrophic vaginitis: Secondary | ICD-10-CM | POA: Diagnosis present

## 2021-12-15 DIAGNOSIS — Z86001 Personal history of in-situ neoplasm of cervix uteri: Secondary | ICD-10-CM | POA: Diagnosis not present

## 2021-12-15 HISTORY — PX: ROBOTIC ASSISTED TOTAL HYSTERECTOMY WITH BILATERAL SALPINGO OOPHERECTOMY: SHX6086

## 2021-12-15 HISTORY — DX: Presence of spectacles and contact lenses: Z97.3

## 2021-12-15 HISTORY — PX: ABDOMINAL HYSTERECTOMY: SHX81

## 2021-12-15 LAB — TYPE AND SCREEN
ABO/RH(D): O POS
Antibody Screen: NEGATIVE

## 2021-12-15 LAB — BASIC METABOLIC PANEL
Anion gap: 8 (ref 5–15)
BUN: 15 mg/dL (ref 8–23)
CO2: 22 mmol/L (ref 22–32)
Calcium: 9 mg/dL (ref 8.9–10.3)
Chloride: 108 mmol/L (ref 98–111)
Creatinine, Ser: 0.86 mg/dL (ref 0.44–1.00)
GFR, Estimated: >60 mL/min (ref >60)
Glucose, Bld: 134 mg/dL — ABNORMAL HIGH (ref 70–99)
Potassium: 5 mmol/L (ref 3.5–5.1)
Sodium: 138 mmol/L (ref 135–145)

## 2021-12-15 SURGERY — HYSTERECTOMY, TOTAL, ROBOT-ASSISTED, LAPAROSCOPIC, WITH BILATERAL SALPINGO-OOPHORECTOMY
Anesthesia: General | Site: Abdomen | Laterality: Bilateral

## 2021-12-15 MED ORDER — KETOROLAC TROMETHAMINE 30 MG/ML IJ SOLN
INTRAMUSCULAR | Status: AC
  Filled 2021-12-15: qty 1

## 2021-12-15 MED ORDER — LIDOCAINE HCL (PF) 2 % IJ SOLN
INTRAMUSCULAR | Status: AC
  Filled 2021-12-15: qty 5

## 2021-12-15 MED ORDER — MENTHOL 3 MG MT LOZG: 1.0000 | LOZENGE | OROMUCOSAL | Status: DC | PRN

## 2021-12-15 MED ORDER — FENTANYL CITRATE (PF) 100 MCG/2ML IJ SOLN: 25.0000 ug | INTRAMUSCULAR | Status: DC | PRN

## 2021-12-15 MED ORDER — EPHEDRINE 5 MG/ML INJ
INTRAVENOUS | Status: AC
  Filled 2021-12-15: qty 5

## 2021-12-15 MED ORDER — ROCURONIUM BROMIDE 10 MG/ML (PF) SYRINGE
PREFILLED_SYRINGE | INTRAVENOUS | Status: DC | PRN
  Administered 2021-12-15: 80 mg via INTRAVENOUS
  Administered 2021-12-15 (×2): 10 mg via INTRAVENOUS
  Administered 2021-12-15: 20 mg via INTRAVENOUS

## 2021-12-15 MED ORDER — SUGAMMADEX SODIUM 200 MG/2ML IV SOLN
INTRAVENOUS | Status: DC | PRN
  Administered 2021-12-15: 200 mg via INTRAVENOUS

## 2021-12-15 MED ORDER — IBUPROFEN 200 MG PO TABS
600.0000 mg | ORAL_TABLET | Freq: Four times a day (QID) | ORAL | Status: DC
  Administered 2021-12-16 – 2021-12-17 (×2): 600 mg via ORAL
  Filled 2021-12-15 (×2): qty 3

## 2021-12-15 MED ORDER — HYDROMORPHONE HCL 1 MG/ML IJ SOLN: 0.2000 mg | INTRAMUSCULAR | Status: DC | PRN

## 2021-12-15 MED ORDER — SODIUM CHLORIDE (PF) 0.9 % IJ SOLN
INTRAMUSCULAR | Status: AC
  Filled 2021-12-15: qty 10

## 2021-12-15 MED ORDER — GLYCOPYRROLATE PF 0.2 MG/ML IJ SOSY
PREFILLED_SYRINGE | INTRAMUSCULAR | Status: DC | PRN
  Administered 2021-12-15: .2 mg via INTRAVENOUS

## 2021-12-15 MED ORDER — 0.9 % SODIUM CHLORIDE (POUR BTL) OPTIME
TOPICAL | Status: DC | PRN
  Administered 2021-12-15: 2000 mL

## 2021-12-15 MED ORDER — LAMOTRIGINE ER 300 MG PO TB24: 1.0000 | ORAL_TABLET | Freq: Two times a day (BID) | ORAL | Status: DC

## 2021-12-15 MED ORDER — PROPOFOL 1000 MG/100ML IV EMUL
INTRAVENOUS | Status: AC
  Filled 2021-12-15: qty 200

## 2021-12-15 MED ORDER — HYDROMORPHONE HCL 1 MG/ML IJ SOLN
INTRAMUSCULAR | Status: DC | PRN
  Administered 2021-12-15 (×3): 1 mg via INTRAVENOUS

## 2021-12-15 MED ORDER — EPHEDRINE SULFATE-NACL 50-0.9 MG/10ML-% IV SOSY
PREFILLED_SYRINGE | INTRAVENOUS | Status: DC | PRN
  Administered 2021-12-15 (×2): 10 mg via INTRAVENOUS

## 2021-12-15 MED ORDER — SIMETHICONE 80 MG PO CHEW: 80.0000 mg | CHEWABLE_TABLET | Freq: Four times a day (QID) | ORAL | Status: DC | PRN

## 2021-12-15 MED ORDER — PHENAZOPYRIDINE HCL 100 MG PO TABS
ORAL_TABLET | ORAL | Status: AC
  Filled 2021-12-15: qty 2

## 2021-12-15 MED ORDER — ROCURONIUM BROMIDE 10 MG/ML (PF) SYRINGE
PREFILLED_SYRINGE | INTRAVENOUS | Status: AC
  Filled 2021-12-15: qty 10

## 2021-12-15 MED ORDER — GLYCOPYRROLATE PF 0.2 MG/ML IJ SOSY
PREFILLED_SYRINGE | INTRAMUSCULAR | Status: AC
  Filled 2021-12-15: qty 1

## 2021-12-15 MED ORDER — ONDANSETRON HCL 4 MG/2ML IJ SOLN
INTRAMUSCULAR | Status: DC | PRN
  Administered 2021-12-15: 4 mg via INTRAVENOUS

## 2021-12-15 MED ORDER — ACETAMINOPHEN 10 MG/ML IV SOLN
1000.0000 mg | Freq: Once | INTRAVENOUS | Status: DC | PRN
  Administered 2021-12-15: 1000 mg via INTRAVENOUS

## 2021-12-15 MED ORDER — OXYCODONE HCL 5 MG PO TABS: 5.0000 mg | ORAL_TABLET | Freq: Once | ORAL | Status: DC | PRN

## 2021-12-15 MED ORDER — DEXAMETHASONE SODIUM PHOSPHATE 10 MG/ML IJ SOLN
INTRAMUSCULAR | Status: AC
  Filled 2021-12-15: qty 1

## 2021-12-15 MED ORDER — BUPIVACAINE HCL (PF) 0.25 % IJ SOLN
INTRAMUSCULAR | Status: DC | PRN
  Administered 2021-12-15: 10 mL

## 2021-12-15 MED ORDER — HYDROMORPHONE HCL 1 MG/ML IJ SOLN
INTRAMUSCULAR | Status: AC
  Filled 2021-12-15: qty 1

## 2021-12-15 MED ORDER — PROPOFOL 10 MG/ML IV BOLUS
INTRAVENOUS | Status: DC | PRN
  Administered 2021-12-15: 200 mg via INTRAVENOUS

## 2021-12-15 MED ORDER — ONDANSETRON HCL 4 MG/2ML IJ SOLN: 4.0000 mg | Freq: Four times a day (QID) | INTRAMUSCULAR | Status: DC | PRN

## 2021-12-15 MED ORDER — PROPOFOL 10 MG/ML IV BOLUS
INTRAVENOUS | Status: AC
  Filled 2021-12-15: qty 20

## 2021-12-15 MED ORDER — KETOROLAC TROMETHAMINE 30 MG/ML IJ SOLN
30.0000 mg | Freq: Four times a day (QID) | INTRAMUSCULAR | Status: AC
  Administered 2021-12-15 – 2021-12-16 (×4): 30 mg via INTRAVENOUS
  Filled 2021-12-15 (×4): qty 1

## 2021-12-15 MED ORDER — ONDANSETRON HCL 4 MG PO TABS: 4.0000 mg | ORAL_TABLET | Freq: Four times a day (QID) | ORAL | Status: DC | PRN

## 2021-12-15 MED ORDER — MIDAZOLAM HCL 2 MG/2ML IJ SOLN
INTRAMUSCULAR | Status: AC
  Filled 2021-12-15: qty 2

## 2021-12-15 MED ORDER — CEFAZOLIN SODIUM-DEXTROSE 2-4 GM/100ML-% IV SOLN
INTRAVENOUS | Status: AC
  Filled 2021-12-15: qty 100

## 2021-12-15 MED ORDER — LAMOTRIGINE ER 100 MG PO TB24
300.0000 mg | ORAL_TABLET | Freq: Two times a day (BID) | ORAL | Status: DC
  Administered 2021-12-15 – 2021-12-16 (×3): 300 mg via ORAL
  Filled 2021-12-15 (×5): qty 3

## 2021-12-15 MED ORDER — PROPOFOL 500 MG/50ML IV EMUL
INTRAVENOUS | Status: DC | PRN
  Administered 2021-12-15: 200 ug/kg/min via INTRAVENOUS

## 2021-12-15 MED ORDER — ONDANSETRON HCL 4 MG/2ML IJ SOLN
INTRAMUSCULAR | Status: AC
  Filled 2021-12-15: qty 2

## 2021-12-15 MED ORDER — CEFAZOLIN SODIUM-DEXTROSE 2-3 GM-%(50ML) IV SOLR
INTRAVENOUS | Status: DC | PRN
  Administered 2021-12-15: 2 g via INTRAVENOUS

## 2021-12-15 MED ORDER — DEXAMETHASONE SODIUM PHOSPHATE 10 MG/ML IJ SOLN
INTRAMUSCULAR | Status: DC | PRN
  Administered 2021-12-15: 10 mg via INTRAVENOUS

## 2021-12-15 MED ORDER — PHENAZOPYRIDINE HCL 100 MG PO TABS
200.0000 mg | ORAL_TABLET | Freq: Once | ORAL | Status: AC
  Administered 2021-12-15: 200 mg via ORAL

## 2021-12-15 MED ORDER — LACTATED RINGERS IV SOLN: INTRAVENOUS | Status: DC

## 2021-12-15 MED ORDER — KETOROLAC TROMETHAMINE 30 MG/ML IJ SOLN: 30.0000 mg | Freq: Four times a day (QID) | INTRAMUSCULAR | Status: DC

## 2021-12-15 MED ORDER — POVIDONE-IODINE 10 % EX SWAB: 2.0000 | Freq: Once | CUTANEOUS | Status: DC

## 2021-12-15 MED ORDER — OXYCODONE HCL 5 MG/5ML PO SOLN: 5.0000 mg | Freq: Once | ORAL | Status: DC | PRN

## 2021-12-15 MED ORDER — ACETAMINOPHEN 500 MG PO TABS: 1000.0000 mg | ORAL_TABLET | Freq: Once | ORAL | Status: DC | PRN

## 2021-12-15 MED ORDER — MIDAZOLAM HCL 5 MG/5ML IJ SOLN
INTRAMUSCULAR | Status: DC | PRN
  Administered 2021-12-15: 2 mg via INTRAVENOUS

## 2021-12-15 MED ORDER — HYDROMORPHONE HCL 2 MG/ML IJ SOLN
INTRAMUSCULAR | Status: AC
  Filled 2021-12-15: qty 1

## 2021-12-15 MED ORDER — LEVETIRACETAM 500 MG PO TABS
500.0000 mg | ORAL_TABLET | Freq: Two times a day (BID) | ORAL | Status: DC
  Administered 2021-12-15 – 2021-12-16 (×3): 500 mg via ORAL
  Filled 2021-12-15 (×3): qty 1

## 2021-12-15 MED ORDER — ACETAMINOPHEN 160 MG/5ML PO SOLN: 1000.0000 mg | Freq: Once | ORAL | Status: DC | PRN

## 2021-12-15 MED ORDER — OXYCODONE HCL 5 MG PO TABS: 5.0000 mg | ORAL_TABLET | ORAL | Status: DC | PRN

## 2021-12-15 MED ORDER — FENTANYL CITRATE (PF) 250 MCG/5ML IJ SOLN
INTRAMUSCULAR | Status: AC
  Filled 2021-12-15: qty 5

## 2021-12-15 MED ORDER — LIDOCAINE 2% (20 MG/ML) 5 ML SYRINGE
INTRAMUSCULAR | Status: DC | PRN
  Administered 2021-12-15: 60 mg via INTRAVENOUS

## 2021-12-15 MED ORDER — STERILE WATER FOR IRRIGATION IR SOLN
Status: DC | PRN
  Administered 2021-12-15: 500 mL

## 2021-12-15 MED ORDER — FENTANYL CITRATE (PF) 100 MCG/2ML IJ SOLN
INTRAMUSCULAR | Status: DC | PRN
  Administered 2021-12-15: 50 ug via INTRAVENOUS
  Administered 2021-12-15: 100 ug via INTRAVENOUS
  Administered 2021-12-15 (×2): 50 ug via INTRAVENOUS

## 2021-12-15 MED ORDER — DEXMEDETOMIDINE (PRECEDEX) IN NS 20 MCG/5ML (4 MCG/ML) IV SYRINGE
PREFILLED_SYRINGE | INTRAVENOUS | Status: DC | PRN
  Administered 2021-12-15: 8 ug via INTRAVENOUS
  Administered 2021-12-15: 12 ug via INTRAVENOUS

## 2021-12-15 MED ORDER — ACETAMINOPHEN 10 MG/ML IV SOLN
INTRAVENOUS | Status: AC
  Filled 2021-12-15: qty 100

## 2021-12-15 MED ORDER — STERILE WATER FOR INJECTION IJ SOLN
INTRAMUSCULAR | Status: AC
  Filled 2021-12-15: qty 10

## 2021-12-15 MED ORDER — IBUPROFEN 200 MG PO TABS: 600.0000 mg | ORAL_TABLET | Freq: Four times a day (QID) | ORAL | Status: DC

## 2021-12-15 MED ORDER — PANTOPRAZOLE SODIUM 40 MG PO TBEC
40.0000 mg | DELAYED_RELEASE_TABLET | Freq: Every day | ORAL | Status: DC
  Administered 2021-12-15 – 2021-12-16 (×2): 40 mg via ORAL
  Filled 2021-12-15 (×2): qty 1

## 2021-12-15 MED ORDER — SODIUM CHLORIDE 0.9 % IR SOLN
Status: DC | PRN
  Administered 2021-12-15: 2000 mL

## 2021-12-15 SURGICAL SUPPLY — 74 items
ADH SKN CLS APL DERMABOND .7 (GAUZE/BANDAGES/DRESSINGS) ×2
APL PRP STRL LF DISP 70% ISPRP (MISCELLANEOUS) ×4
BAG DECANTER FOR FLEXI CONT (MISCELLANEOUS) IMPLANT
CHLORAPREP W/TINT 26 (MISCELLANEOUS) ×3 IMPLANT
CNTNR URN SCR LID CUP LEK RST (MISCELLANEOUS) ×3 IMPLANT
CONT SPEC 4OZ STRL OR WHT (MISCELLANEOUS) ×2
COUNTER NEEDLE 1200 MAGNETIC (NEEDLE) IMPLANT
COVER BACK TABLE 60X90IN (DRAPES) ×3 IMPLANT
COVER TIP SHEARS 8 DVNC (MISCELLANEOUS) ×3 IMPLANT
COVER TIP SHEARS 8MM DA VINCI (MISCELLANEOUS) ×2
DECANTER SPIKE VIAL GLASS SM (MISCELLANEOUS) ×6 IMPLANT
DEFOGGER SCOPE WARMER CLEARIFY (MISCELLANEOUS) ×3 IMPLANT
DERMABOND ADVANCED (GAUZE/BANDAGES/DRESSINGS) ×2
DERMABOND ADVANCED .7 DNX12 (GAUZE/BANDAGES/DRESSINGS) ×3 IMPLANT
DRAPE ARM DVNC X/XI (DISPOSABLE) ×12 IMPLANT
DRAPE COLUMN DVNC XI (DISPOSABLE) ×3 IMPLANT
DRAPE DA VINCI XI ARM (DISPOSABLE)
DRAPE DA VINCI XI COLUMN (DISPOSABLE)
DRAPE HYSTEROSCOPY (MISCELLANEOUS) IMPLANT
DRAPE UTILITY XL STRL (DRAPES) ×3 IMPLANT
DRSG OPSITE POSTOP 4X10 (GAUZE/BANDAGES/DRESSINGS) IMPLANT
ELECT REM PT RETURN 9FT ADLT (ELECTROSURGICAL) ×2
ELECTRODE REM PT RTRN 9FT ADLT (ELECTROSURGICAL) ×3 IMPLANT
GAUZE 4X4 16PLY ~~LOC~~+RFID DBL (SPONGE) ×6 IMPLANT
GLOVE BIO SURGEON STRL SZ 6.5 (GLOVE) ×9 IMPLANT
GLOVE BIOGEL PI IND STRL 7.0 (GLOVE) ×15 IMPLANT
GLOVE BIOGEL PI INDICATOR 7.0 (GLOVE) ×22
HOLDER FOLEY CATH W/STRAP (MISCELLANEOUS) IMPLANT
IRRIG SUCT STRYKERFLOW 2 WTIP (MISCELLANEOUS) ×2
IRRIGATION SUCT STRKRFLW 2 WTP (MISCELLANEOUS) ×3 IMPLANT
IV NS 1000ML (IV SOLUTION) ×2
IV NS 1000ML BAXH (IV SOLUTION) ×3 IMPLANT
KIT TURNOVER CYSTO (KITS) ×3 IMPLANT
LEGGING LITHOTOMY PAIR STRL (DRAPES) ×3 IMPLANT
MANIPULATOR UTERINE 4.5 ZUMI (MISCELLANEOUS) IMPLANT
NS IRRIG 1000ML POUR BTL (IV SOLUTION) IMPLANT
OBTURATOR OPTICAL STANDARD 8MM (TROCAR)
OBTURATOR OPTICAL STND 8 DVNC (TROCAR)
OBTURATOR OPTICALSTD 8 DVNC (TROCAR) ×3 IMPLANT
OCCLUDER COLPOPNEUMO (BALLOONS) ×3 IMPLANT
PACK ROBOT WH (CUSTOM PROCEDURE TRAY) ×3 IMPLANT
PACK ROBOTIC GOWN (GOWN DISPOSABLE) ×3 IMPLANT
PACK TRENDGUARD 450 HYBRID PRO (MISCELLANEOUS) IMPLANT
PAD OB MATERNITY 4.3X12.25 (PERSONAL CARE ITEMS) ×3 IMPLANT
PAD PREP 24X48 CUFFED NSTRL (MISCELLANEOUS) ×3 IMPLANT
PROTECTOR NERVE ULNAR (MISCELLANEOUS) ×6 IMPLANT
RTRCTR C-SECT PINK 25CM LRG (MISCELLANEOUS) IMPLANT
SEAL CANN UNIV 5-8 DVNC XI (MISCELLANEOUS) ×12 IMPLANT
SEAL XI 5MM-8MM UNIVERSAL (MISCELLANEOUS)
SET TRI-LUMEN FLTR TB AIRSEAL (TUBING) ×3 IMPLANT
SPIKE FLUID TRANSFER (MISCELLANEOUS) IMPLANT
SPONGE T-LAP 18X18 ~~LOC~~+RFID (SPONGE) IMPLANT
SPONGE T-LAP 18X36 ~~LOC~~+RFID STR (SPONGE) IMPLANT
SUT MON AB 4-0 PS1 27 (SUTURE) IMPLANT
SUT VIC AB 0 CT1 18XCR BRD8 (SUTURE) IMPLANT
SUT VIC AB 0 CT1 27 (SUTURE) ×10
SUT VIC AB 0 CT1 27XBRD ANBCTR (SUTURE) ×6 IMPLANT
SUT VIC AB 0 CT1 8-18 (SUTURE) ×4
SUT VIC AB 2-0 CT1 27 (SUTURE) ×2
SUT VIC AB 2-0 CT1 TAPERPNT 27 (SUTURE) IMPLANT
SUT VIC AB 4-0 PS2 27 (SUTURE) ×6 IMPLANT
SUT VICRYL 0 UR6 27IN ABS (SUTURE) ×3 IMPLANT
SUT VICRYL 1 TIES 12X18 (SUTURE) IMPLANT
SUT VLOC 180 0 9IN  GS21 (SUTURE) ×2
SUT VLOC 180 0 9IN GS21 (SUTURE) ×3 IMPLANT
SYR BULB IRRIG 60ML STRL (SYRINGE) IMPLANT
TIP UTERINE 5.1X6CM LAV DISP (MISCELLANEOUS) IMPLANT
TIP UTERINE 6.7X6CM WHT DISP (MISCELLANEOUS) IMPLANT
TIP UTERINE 6.7X8CM BLUE DISP (MISCELLANEOUS) IMPLANT
TOWEL OR 17X26 10 PK STRL BLUE (TOWEL DISPOSABLE) ×3 IMPLANT
TRAP SPECIMEN MUCUS 40CC (MISCELLANEOUS) IMPLANT
TRAY FOLEY W/BAG SLVR 14FR LF (SET/KITS/TRAYS/PACK) ×3 IMPLANT
TRENDGUARD 450 HYBRID PRO PACK (MISCELLANEOUS) ×2
TROCAR PORT AIRSEAL 5X120 (TROCAR) ×3 IMPLANT

## 2021-12-15 NOTE — Anesthesia Preprocedure Evaluation (Addendum)
Anesthesia Evaluation  Patient identified by MRN, date of birth, ID band Patient awake    Reviewed: Allergy & Precautions, NPO status , Patient's Chart, lab work & pertinent test results  History of Anesthesia Complications Negative for: history of anesthetic complications  Airway Mallampati: II  TM Distance: >3 FB Neck ROM: Full    Dental no notable dental hx.    Pulmonary neg pulmonary ROS,    Pulmonary exam normal breath sounds clear to auscultation       Cardiovascular (-) angina(-) Past MI and (-) CHF Normal cardiovascular exam+ pacemaker  Rhythm:Regular Rate:Normal  1. Left ventricular ejection fraction, by estimation, is 60 to 65%. The  left ventricle has normal function. The left ventricle has no regional  wall motion abnormalities. Left ventricular diastolic parameters are  consistent with Grade I diastolic  dysfunction (impaired relaxation).  2. Right ventricular systolic function is normal. The right ventricular  size is normal.  3. The mitral valve is normal in structure. Mild to moderate mitral valve  regurgitation. No evidence of mitral stenosis.  4. Tricuspid valve regurgitation is moderate.  5. The aortic valve is tricuspid. Aortic valve regurgitation is not  visualized. No aortic stenosis is present.  6. The inferior vena cava is normal in size with greater than 50%  respiratory variability, suggesting right atrial pressure of 3 mmHg.   AS/VS 99%   Neuro/Psych  Headaches, Seizures -, Well Controlled,  negative psych ROS   GI/Hepatic negative GI ROS, Neg liver ROS,   Endo/Other  negative endocrine ROSLab Results      Component                Value               Date                      HGBA1C                   5.5                 12/06/2020             Renal/GU negative Renal ROSLab Results      Component                Value               Date                      CREATININE               0.99                 12/12/2021                Musculoskeletal negative musculoskeletal ROS (+)   Abdominal   Peds  Hematology negative hematology ROS (+) Lab Results      Component                Value               Date                      WBC                      6.3  12/12/2021                HGB                      13.4                12/12/2021                HCT                      40.6                12/12/2021                MCV                      91.0                12/12/2021                PLT                      244                 12/12/2021              Anesthesia Other Findings   Reproductive/Obstetrics                            Anesthesia Physical Anesthesia Plan  ASA: 3  Anesthesia Plan: General   Post-op Pain Management: Ofirmev IV (intra-op)* and Toradol IV (intra-op)*   Induction: Intravenous  PONV Risk Score and Plan: 3 and Ondansetron, Dexamethasone, Propofol infusion and TIVA  Airway Management Planned: Oral ETT  Additional Equipment: None  Intra-op Plan:   Post-operative Plan: Extubation in OR  Informed Consent: I have reviewed the patients History and Physical, chart, labs and discussed the procedure including the risks, benefits and alternatives for the proposed anesthesia with the patient or authorized representative who has indicated his/her understanding and acceptance.     Dental advisory given  Plan Discussed with: CRNA  Anesthesia Plan Comments:         Anesthesia Quick Evaluation

## 2021-12-15 NOTE — Brief Op Note (Signed)
12/15/2021  5:11 PM  PATIENT:  [name] [name]  66 y.o. 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  PROCEDURE:  Procedure(s): ATTEMPTED XI ROBOTIC ASSISTED TOTAL HYSTERECTOMY WITH BILATERAL SALPINGO OOPHORECTOMY (Bilateral) HYSTERECTOMY ABDOMINAL with BILATERAL SALPINGO-OOPHORECTOMY (Bilateral)  SURGEON:  Surgeon(s) and Role:    * Aloha Gell, MD - Primary    * Azucena Fallen, MD - Assisting  PHYSICIAN ASSISTANT: Derrell Lolling for the first part of the [place] for the second part of TAH  ASSISTANTS: As above  ANESTHESIA:   general  EBL:  50 mL   BLOOD ADMINISTERED:none  DRAINS: Urinary Catheter (Foley)   LOCAL MEDICATIONS USED:  MARCAINE     SPECIMEN: Uterus cervix bilateral fallopian tubes and ovaries  DISPOSITION OF SPECIMEN:  PATHOLOGY  COUNTS:  YES  TOURNIQUET:  * No tourniquets in log *  DICTATION: .Note written in EPIC  PLAN OF CARE: Admit to inpatient   PATIENT DISPOSITION:  PACU - hemodynamically stable.   Delay start of Pharmacological VTE agent (>24hrs) due to surgical blood loss or risk of bleeding: yes

## 2021-12-15 NOTE — Op Note (Signed)
12/15/2021  5:11 PM  PATIENT:  [name] [name]  66 y.o. 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  PROCEDURE:  Procedure(s): ATTEMPTED XI ROBOTIC ASSISTED TOTAL HYSTERECTOMY WITH BILATERAL SALPINGO OOPHORECTOMY (Bilateral) HYSTERECTOMY ABDOMINAL with BILATERAL SALPINGO-OOPHORECTOMY (Bilateral)  SURGEON:  Surgeon(s) and Role:    * Aloha Gell, MD - Primary    * Azucena Fallen, MD - Assisting  PHYSICIAN ASSISTANT: Derrell Lolling for the first part of the [place] for the second part of TAH  ASSISTANTS: As above  ANESTHESIA:   general  EBL:  50 mL   BLOOD ADMINISTERED:none  DRAINS: Urinary Catheter (Foley)   LOCAL MEDICATIONS USED:  MARCAINE     SPECIMEN: Uterus cervix bilateral fallopian tubes and ovaries  DISPOSITION OF SPECIMEN:  PATHOLOGY  COUNTS:  YES  TOURNIQUET:  * No tourniquets in log *  DICTATION: .Note written in EPIC  PLAN OF CARE: Admit to inpatient   PATIENT DISPOSITION:  PACU - hemodynamically stable.   Delay start of Pharmacological VTE agent (>24hrs) due to surgical blood loss or risk of bleeding: yes    INDICATIONS: The patient is a 66 y.o. 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  Given risks of progression to cervical cancer decision was made to proceed with hysterectomy and given age bilateral salpingo-oophorectomy was opted for.   On the preoperative visit, the risks, benefits, indications, and alternatives of the procedure were reviewed with the patient.  Given patient's cervical stenosis, atrophic vagina, cervix flush with the vaginal wall preoperatively while the plan was for robotic assisted laparoscopic hysterectomy we had several times discussed the possibility of needing an open abdominal procedure.  On the day of surgery, the risks of surgery were again  discussed with the patient including but not limited to: bleeding which may require transfusion or reoperation; infection which may require antibiotics; injury to bowel, bladder, ureters or other surrounding organs; need for additional procedures; thromboembolic phenomenon, incisional problems and other postoperative/anesthesia complications. Written informed consent was obtained.    OPERATIVE FINDINGS: A 5 week size  with normal tubes and ovaries bilaterally.  ESTIMATED BLOOD LOSS: Per anesthesia notes ml Antibiotics: 2 g Ancef. SPECIMENS:  Uterus, cervix and bilateral tubes ,  sent to pathology COMPLICATIONS:  None immediate.   DESCRIPTION OF PROCEDURE: The patient received intravenous antibiotics and had sequential compression devices applied to her lower extremities while in the preoperative area.   She was taken to the operating room and placed under general anesthesia without difficulty and found to be adequate.The abdomen and perineum were prepped and draped in a sterile manner, and a Foley catheter was inserted into the bladder and attached to constant drainage. After an adequate timeout was performed, a speculum was placed in the vagina.  Uterine sound and cervical sizing was done.  Stay sutures were placed on the anterior and posterior lip of the cervix.  Of note using blunt retraction and tenaculum is on both the anterior and posterior cervical lip the cervix was attempted to be dissected away from the vaginal epithelium to which it was flush with.  Attempt was then made to use cervical manipulation.  Over 1 hour was spent in this attempt.  Initially I tried to place a small Rumi Koh ring with a thin 6 cm tip.  Then I used the thicker tip then I tried an 8 cm  tip.  I also tried using a Ambulance person with a small Rumi Koh ring.  On all of the attempts despite using 5 or 10 cc of intra balloon fluid the balloon continued to fall out of the cervix.  During this time tearing was happening of the  cervix and  the upper vagina.  After about an hour of troubleshooting decision was made to proceed with open hysterectomy.  Consideration was given to a laparoscopic-assisted vaginal hysterectomy but given the flushness of the cervix with the anterior posterior vaginal walls I did not think this would be a successful procedure.  Patient's significant atrophy and tearing of the vaginal tissue impacted this decision.  Pfannensteil skin incision was mad after first placing local anesthetic. This incision was taken down to the fascia using electrocautery with care given to maintain good hemostasis. The fascia was incised in the midline and the fascial incision was then extended bilaterally using sharp dissection. The fascia was then dissected off the underlying rectus muscles using blunt and sharp dissection with cautery when needed. The rectus muscles were split bluntly in the midline and the peritoneum entered sharply without complication. This peritoneal incision was then extended superiorly and inferiorly with care given to prevent bowel or bladder injury.   Attention was then turned to the pelvis.  An Alexis self retaining retractor was placed into the incision, and the bowel was packed away with moist laparotomy sponges. The uterus was palpated, found to be free of adhesions.  Artelia Game clamps were placed on the angle of the uterus bilaterally.  THe round ligaments on each side were clamped, suture ligated, and transected with electrocautery allowing entry into the broad ligament. The anterior and posterior leaves of the broad ligament were separated and dissection carried down to create a bladder flap.   Bilateral ureters were not visualized but ureteric stent was felt deep in the pelvis. (A hole was created in the clear portion of the posterior broad ligament and the infundibulopelvic ligament clamped on the patient's  left side. This pedicle was then clamped, cut, and doubly suture ligated with good  hemostasis.  On the right side given limited mobility of the right infundibulopelvic ligament the tube and ovaries were left in situ.  A hole was created in the broad ligament and a clamp was placed across the utero-ovarian ligaments.  This was clamped and ligated.    Additional dissection of the  bladder flap was then created across the anterior leaf of the broad ligament and the bladder was then bluntly and sharply dissected off the lower uterine segment and cervix with good hemostasis. The uterine arteries were then skeletonized bilaterally and then clamped, cut, and ligated with care given to prevent ureteral injury.  No significant uterosacral ligaments were identified.   Dissection of the cardinal ligaments was done sharply with suture ligation. Curved clamps were placed across the vagina just under the cervix, and the specimen was amputated and sent to pathology. Heaney sutures were placed in the vaginal angles bilaterally.  The sutures were tied to the midpoint of the vaginal cuff. The vaginal cuff was closed with a series of interrupted 0 Vicryl figure-of-eight sutures with care given to incorporate the anterior pubocervical fascia and the posterior rectovaginal fascia.  The vaginal cuff angles were noted to have good hemostasis.  The pelvis was irrigated and hemostasis was reconfirmed at all pedicles and along the pelvic sidewall.    Now with better visualization and access to the right tube and ovary the  percutaneous sutures were placed across the right infundibulopelvic ligament and the right tube and ovary were transected removed and the pedicle was suture-ligated x2.  Good hemostasis was noted.    All laparotomy sponges and instruments were removed from the abdomen. The peritoneum was closed with 2-0 Vicryl, and the fascia was closed with 0 Vicryl in a running fashion in 2 halves. The subcutaneous layer was reapproximated with 2-0 plain gut. The skin was closed with a 4-0 Monocryl subcuticular  stitch.   A speculum was placed in the vagina and while the vaginal tissue at the cuff was irregular from previous tearing no active bleeding was noted.    Sponge, lap, needle, and instrument counts were correct times two. The patient was taken to the recovery area awake, extubated and in stable condition.

## 2021-12-15 NOTE — Anesthesia Postprocedure Evaluation (Signed)
Anesthesia Post Note  Patient: [name] [name]  Procedure(s) Performed: ATTEMPTED XI ROBOTIC ASSISTED TOTAL HYSTERECTOMY WITH BILATERAL SALPINGO OOPHORECTOMY (Bilateral) HYSTERECTOMY ABDOMINAL with BILATERAL SALPINGO-OOPHORECTOMY (Bilateral: Abdomen)     Patient location during evaluation: PACU Anesthesia Type: General Level of consciousness: awake and alert Pain management: pain level controlled Vital Signs Assessment: post-procedure vital signs reviewed and stable Respiratory status: spontaneous breathing, nonlabored ventilation, respiratory function stable and patient connected to nasal cannula oxygen Cardiovascular status: blood pressure returned to baseline and stable Postop Assessment: no apparent nausea or vomiting Anesthetic complications: no   No notable events documented.  Last Vitals:  Vitals:   12/15/21 1915 12/15/21 1930  BP: 128/69 126/78  Pulse: 88 83  Resp: (!) 24 17  Temp:    SpO2: 95% 96%    Last Pain:  Vitals:   12/15/21 1213  TempSrc: Oral  PainSc: 0-No pain                 Tamera Pingley S

## 2021-12-15 NOTE — Transfer of Care (Signed)
Immediate Anesthesia Transfer of Care Note  Patient: [name] [name]  Procedure(s) Performed: Procedure(s) (LRB): ATTEMPTED XI ROBOTIC ASSISTED TOTAL HYSTERECTOMY WITH BILATERAL SALPINGO OOPHORECTOMY (Bilateral) HYSTERECTOMY ABDOMINAL with BILATERAL SALPINGO-OOPHORECTOMY (Bilateral)  Patient Location: PACU  Anesthesia Type: General  Level of Consciousness: awake, oriented, sedated and patient cooperative  Airway & Oxygen Therapy: Patient Spontanous Breathing and Patient connected to face mask oxygen  Post-op Assessment: Report given to PACU RN and Post -op Vital signs reviewed and stable  Post vital signs: Reviewed and stable  Complications: No apparent anesthesia complications Last Vitals:  Vitals Value Taken Time  BP    Temp    Pulse 95 12/15/21 1720  Resp    SpO2 95 % 12/15/21 1720  Vitals shown include unvalidated device data.  Last Pain:  Vitals:   12/15/21 1213  TempSrc: Oral  PainSc: 0-No pain      Patients Stated Pain Goal: 5 (50/38/88 2800)  Complications: No notable events documented.

## 2021-12-15 NOTE — Anesthesia Procedure Notes (Signed)
Procedure Name: Intubation Date/Time: 12/15/2021 1:41 PM  Performed by: Rogers Blocker, CRNAPre-anesthesia Checklist: Patient identified, Emergency Drugs available, Suction available and Patient being monitored Patient Re-evaluated:Patient Re-evaluated prior to induction Oxygen Delivery Method: Circle System Utilized Preoxygenation: Pre-oxygenation with 100% oxygen Induction Type: IV induction Ventilation: Mask ventilation without difficulty Laryngoscope Size: Mac and 3 Grade View: Grade I Tube type: Oral Tube size: 7.0 mm Number of attempts: 1 Airway Equipment and Method: Stylet and Bite block Placement Confirmation: ETT inserted through vocal cords under direct vision, positive ETCO2 and breath sounds checked- equal and bilateral Secured at: 22 cm Tube secured with: Tape Dental Injury: Teeth and Oropharynx as per pre-operative assessment

## 2021-12-15 NOTE — H&P (Signed)
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  Most recent colposcopy on May 23 showed CIN-3 on limited/scanty biopsy.  Patient's evaluation has been limited by atrophic vaginitis, cervical stenosis.  HPV has been found on this patient since 2019.  Recent endometrial biopsy was negative.  Recent ultrasound showed uterus 5.3 x 3.6 x 2.7 cm with an endometrial stripe of 2 mm and bilateral ovaries were obscured.  Past surgical history: Postpartum tubal ligation, hip and shoulder surgeries, pacemaker placement Past medical history epilepsy, pacemaker, deafness Medications Keppra 500 mg twice daily, Lamictal 300 mg extended release twice daily  Past Medical History:  Diagnosis Date   Chicken pox    Conductive hearing loss, childhood onset    Deafness    from medication as a child for chicken pox   Epilepsy ([place])    Follows w/ Dr. [name]. LOV as of 11/30/21 was 11/14/21 with Dr. [name] in [place].   Hx of adenomatous colonic polyps 62/22/9798   Metabolic encephalopathy 92/03/9416   See ED note from Dr. Tomasa Rand dated 07/17/20 in Epic. Two days after having shoulder surgery, pt had a syncopal episode and was unresponsive for several minutes. Per MD note, patient confusions was suspected to be due to hyponatremia and post-op pain meds.   Pacemaker    2012, LOV with cardiology on 11/22/21 with Tommye Standard, PA-C. Cardiac clearance given for 12/15/21 hysterectomy.   Seizures ([place])    [place] with neurology 11/14/21 Dr. [name]. Last seizure was 05/31/20 as of 11/30/21.   Vitamin B12 deficiency 2023   currently on supplementation as of 11/30/21   Vitamin D deficiency 2023   currently on supplementation as of 11/30/21   Wears glasses    Past Surgical History:  Procedure Laterality Date   COLONOSCOPY  11/13/2016   [place]  Endoscopy, 2 diminutive adenomas   PACEMAKER INSERTION  07/31/2010   TOTAL HIP ARTHROPLASTY  07/31/2011   right   TOTAL HIP ARTHROPLASTY  03/01/2012   left   TOTAL SHOULDER ARTHROPLASTY Right 07/15/2020   Procedure: TOTAL SHOULDER ARTHROPLASTY;  Surgeon: Tania Ade, MD;  Location: [facility];  Service: Orthopedics;  Laterality: Right;  NEED 120 MINUTES   Physical exam: Vitals:   12/15/21 1213  BP: (!) 141/77  Pulse: 76  Resp: 16  Temp: 98.1 F (36.7 C)  SpO2: 100%   General: Well-appearing no distress Skin: Warm and dry Abdomen: Nontender nondistended GU deferred to OR lower extremity: Nontender, no edema  CBC    Component Value Date/Time   WBC 6.3 12/12/2021 1339   RBC 4.46 12/12/2021 1339   HGB 13.4 12/12/2021 1339   HCT 40.6 12/12/2021 1339   PLT 244 12/12/2021 1339   MCV 91.0 12/12/2021 1339   MCH 30.0 12/12/2021 1339   MCHC 33.0 12/12/2021 1339   RDW 14.6 12/12/2021 1339   LYMPHSABS 1.6 04/13/2021 1542   MONOABS 0.5 04/13/2021 1542   EOSABS 0.0 04/13/2021 1542   BASOSABS 0.0 04/13/2021 1542   Assessment plan: 66 year old G3, P2 with 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  Given risk of progression to cervical cancer decision was made to proceed with total hysterectomy.  Patient a good candidate for robotic approach.  Given age we will plan for bilateral salpingo-oophorectomy as well.  Risk benefits have been discussed with patient.  Patient consents to blood if needed.  We will continue  her antiseizure medicines postoperatively.  Patient is deaf and does need the help of a sign interpreter.  Ala Dach 12/15/2021 1:31 PM

## 2021-12-16 ENCOUNTER — Encounter (HOSPITAL_BASED_OUTPATIENT_CLINIC_OR_DEPARTMENT_OTHER): Payer: Self-pay | Admitting: Obstetrics

## 2021-12-16 LAB — CBC
HCT: 37.4 % (ref 36.0–46.0)
Hemoglobin: 12.4 g/dL (ref 12.0–15.0)
MCH: 30.2 pg (ref 26.0–34.0)
MCHC: 33.2 g/dL (ref 30.0–36.0)
MCV: 91.2 fL (ref 80.0–100.0)
Platelets: 177 10*3/uL (ref 150–400)
RBC: 4.1 MIL/uL (ref 3.87–5.11)
RDW: 14.1 % (ref 11.5–15.5)
WBC: 9.3 10*3/uL (ref 4.0–10.5)
nRBC: 0 % (ref 0.0–0.2)

## 2021-12-16 LAB — BASIC METABOLIC PANEL
Anion gap: 8 (ref 5–15)
BUN: 11 mg/dL (ref 8–23)
CO2: 24 mmol/L (ref 22–32)
Calcium: 9.1 mg/dL (ref 8.9–10.3)
Chloride: 106 mmol/L (ref 98–111)
Creatinine, Ser: 0.84 mg/dL (ref 0.44–1.00)
GFR, Estimated: >60 mL/min (ref >60)
Glucose, Bld: 104 mg/dL — ABNORMAL HIGH (ref 70–99)
Potassium: 4.9 mmol/L (ref 3.5–5.1)
Sodium: 138 mmol/L (ref 135–145)

## 2021-12-16 MED ORDER — ACETAMINOPHEN 500 MG PO TABS
1000.0000 mg | ORAL_TABLET | Freq: Four times a day (QID) | ORAL | Status: DC
  Administered 2021-12-16 – 2021-12-17 (×6): 1000 mg via ORAL
  Filled 2021-12-16 (×5): qty 2

## 2021-12-16 MED ORDER — ACETAMINOPHEN 500 MG PO TABS
ORAL_TABLET | ORAL | Status: AC
  Filled 2021-12-16: qty 2

## 2021-12-16 NOTE — Progress Notes (Signed)
Mobility Specialist - Progress Note   12/16/21 1526  Mobility  HOB Elevated/Bed Position Self regulated  Activity Ambulated with assistance in hallway;Ambulated with assistance to bathroom  Range of Motion/Exercises Active  Level of Assistance Contact guard assist, steadying assist  Assistive Device Front wheel walker  Distance Ambulated (ft) 125 ft  Activity Response Tolerated well  Transport method Ambulatory  $Mobility charge 1 Mobility   Pt received in bed and agreeable to mobility. C/o abdominal tenderness during ambulation. Pt to restroom w/ daughter w/ all needs met.    [facility]

## 2021-12-16 NOTE — Progress Notes (Signed)
  Transition of Care [facility]) Screening Note   Patient Details  Name: [name] [name] Date of Birth: [birth year]   Transition of Care [facility]) CM/SW Contact:    Vassie Moselle, LCSW Phone Number: 12/16/2021, 11:22 AM    Transition of Care Department [facility]) has reviewed patient and no TOC needs have been identified at this time. We will continue to monitor patient advancement through interdisciplinary progression rounds. If new patient transition needs arise, please place a TOC consult.

## 2021-12-16 NOTE — Progress Notes (Signed)
Postop day 1: Status post attempted robotic hysterectomy with conversion to total abdominal hysterectomy bilateral salpingo-oophorectomy  Subjective: Patient notes lower abdominal pain but has not asked for pain medicines.  She is only received her scheduled Toradol overnight.  Foley catheter out at 6 AM.  Patient has not yet voided but feels like she could void now.  Patient notes dry mouth and has been tolerating liquids.  Patient has not had any food but does feel hungry this morning.  Patient unsure about any vaginal bleeding.  Patient notes no emesis.  Patient notes no flatus.  Objective: Vitals:   12/15/21 1930 12/15/21 2008 12/15/21 2342 12/16/21 0438  BP: 126/78 135/70 (!) 142/74 135/71  Pulse: 83 84 69 72  Resp: '17 20 18 18  '$ Temp: (!) 97.3 F (36.3 C) (!) 97.5 F (36.4 C) 97.7 F (36.5 C) 98.6 F (37 C)  TempSrc:  Oral Oral Oral  SpO2: 96% 93% 96% 94%  Weight:      Height:       General: Laying in bed, no distress Skin: Warm and dry Cardiovascular: Regular rate and rhythm Pulmonary: Clear to auscultation bilaterally Abdomen: Soft, no significant distention, appropriately tender, active bowel sounds Incision: Dressed with no staining GU: No staining Lower extremity: Nontender, no edema     Latest Ref Rng & Units 12/16/2021    5:39 AM 12/15/2021    9:22 PM 12/12/2021    1:39 PM  BMP  Glucose 70 - 99 mg/dL 104  134  92   BUN 8 - 23 mg/dL '11  15  11   '$ Creatinine 0.44 - 1.00 mg/dL 0.84  0.86  0.99   Sodium 135 - 145 mmol/L 138  138  136   Potassium 3.5 - 5.1 mmol/L 4.9  5.0  4.6   Chloride 98 - 111 mmol/L 106  108  104   CO2 22 - 32 mmol/L '24  22  25   '$ Calcium 8.9 - 10.3 mg/dL 9.1  9.0  9.7       Latest Ref Rng & Units 12/16/2021    5:39 AM 12/12/2021    1:39 PM 04/13/2021    3:42 PM  CBC  WBC 4.0 - 10.5 K/uL 9.3  6.3  4.8   Hemoglobin 12.0 - 15.0 g/dL 12.4  13.4  12.8   Hematocrit 36.0 - 46.0 % 37.4  40.6  38.0   Platelets 150 - 400 K/uL 177  244  267.0     Assessment and plan: 66 year old postop day 1 status post total abdominal hysterectomy after failed attempt at robotic hysterectomy. Postop: Encouraged pain medication use if having pain.  Discussed with patient reason for conversion of hysterectomy.  Reviewed plan for the day and include voiding trial, pain control with p.o. medicines and ambulation.  Dispo plan likely home tomorrow.  Also reviewed with patient final pathology pending.  Overall patient is meeting expected goals.  Ala Dach 12/16/2021 8:30 AM

## 2021-12-16 NOTE — Progress Notes (Signed)
MD @ bedside with translator

## 2021-12-17 MED ORDER — OXYCODONE HCL 5 MG PO TABS: 5.0000 mg | ORAL_TABLET | Freq: Four times a day (QID) | ORAL | 0 refills | Status: DC | PRN

## 2021-12-17 MED ORDER — OXYCODONE HCL 5 MG PO TABS: 5.0000 mg | ORAL_TABLET | Freq: Four times a day (QID) | ORAL | 0 refills | Status: AC | PRN

## 2021-12-17 MED ORDER — IBUPROFEN 600 MG PO TABS: 600.0000 mg | ORAL_TABLET | Freq: Four times a day (QID) | ORAL | 1 refills | Status: AC

## 2021-12-17 MED ORDER — MENTHOL 3 MG MT LOZG: 1.0000 | LOZENGE | OROMUCOSAL | 1 refills | Status: DC | PRN

## 2021-12-17 NOTE — Discharge Summary (Signed)
[name] [name] MRN: [id number] DOB/AGE: 11/08/1955 66 y.o.  Admit date: 12/15/2021 Discharge date: December 17, 2021  Admission Diagnoses: High grade squamous intraepithelial lesion (HGSIL), grade 3 CIN, on biopsy of cervix [D06.9] S/P hysterectomy with oophorectomy [Z90.710, Z90.721]  Discharge Diagnoses: High grade squamous intraepithelial lesion (HGSIL), grade 3 CIN, on biopsy of cervix [D06.9] S/P hysterectomy with oophorectomy [Z90.710, Z90.721]        Principal Problem:   High grade squamous intraepithelial lesion (HGSIL), grade 3 CIN, on biopsy of cervix Active Problems:   S/P hysterectomy with oophorectomy   Discharged Condition: stable  Hospital Course: Patient admitted for planned robotic assisted total laparoscopic hysterectomy with bilateral salpingo-oophorectomy.  After 1 hour of attempting to obtain uterine manipulation with cervical ring placement to allow robotic procedure procedure was abandoned due to inability to place cervical ring.  Due to atrophy and upper vaginal agglutination as well as significant cervical tearing, causing distortion of the cervix decision was made to proceed with total abdominal hysterectomy and bilateral salpingo-oophorectomy.  This happened without complications.  Patient is doing well postoperatively and meeting all milestones  On postoperative day 2 patient notes tolerating regular diet without nausea.  She reports small flatus but no bowel movement.  She reports no chest pain or shortness of breath.  Patient states still painful to walk and move but at rest her pain is well controlled with oral medications.  Patient reports vaginal bleeding initially but none overnight her pad remains dry.  She does note some pink spotting with voiding and wiping.  On postoperative day 2 Vitals:   12/16/21 0907 12/16/21 1422 12/16/21 2038 12/17/21 0528  BP: 139/72 (!) 127/57 135/61 137/74  Pulse: 64 64 66 66  Resp: '17 17 18 18  '$ Temp: (!) 97.5 F (36.4 C) (!) 97.5  F (36.4 C) 98.6 F (37 C) 97.8 F (36.6 C)  TempSrc: Oral Oral Oral Oral  SpO2: 97% 100% 98% 94%  Weight:      Height:       General: Well-appearing, sitting in chair, no distress Cardiovascular: Regular rate and rhythm Pulmonary: Clear to auscultation bilaterally Skin: Warm and dry Incision: Dressed and clean, no staining Abdomen: Obese, slight but soft distention, appropriately tender GU: No staining Lower extremity: Nontender, no edema  Consults: None  Treatments: surgery: Attempted robotic assisted total laparoscopic hysterectomy with bilateral salpingo-oophorectomy converted to total abdominal hysterectomy with bilateral salpingo-oophorectomy  Disposition: Discharge disposition: 01-Home or Self Care        Allergies as of 12/17/2021   No Known Allergies      Medication List     TAKE these medications    acetaminophen 325 MG tablet Commonly known as: TYLENOL Take 650 mg by mouth every 6 (six) hours as needed for mild pain or moderate pain.   ascorbic acid 500 MG tablet Commonly known as: VITAMIN C Take 500 mg by mouth daily.   cholecalciferol 25 MCG (1000 UNIT) tablet Commonly known as: VITAMIN D3 Take 1,000 Units by mouth 2 (two) times daily.   folic acid 1 MG tablet Commonly known as: FOLVITE Take 1 mg by mouth daily.   ibuprofen 600 MG tablet Commonly known as: ADVIL Take 1 tablet (600 mg total) by mouth every 6 (six) hours.   LamoTRIgine 300 MG Tb24 24 hour tablet Take 1 tablet by mouth twice daily   levETIRAcetam 500 MG tablet Commonly known as: KEPPRA Take 1 tablet (500 mg total) by mouth 2 (two) times daily.   Magnesium 400 MG  Caps Take 400 mg by mouth daily.   menthol-cetylpyridinium 3 MG lozenge Commonly known as: CEPACOL Take 1 lozenge (3 mg total) by mouth every 4 (four) hours as needed for sore throat.   multivitamin with minerals Tabs tablet Take 2 tablets by mouth daily.   oxyCODONE 5 MG immediate release tablet Commonly  known as: Oxy IR/ROXICODONE Take 1-2 tablets (5-10 mg total) by mouth every 6 (six) hours as needed for up to 7 days for moderate pain.         Signed: Ala Dach, MD 12/17/2021, 9:33 AM

## 2021-12-19 ENCOUNTER — Telehealth: Payer: Self-pay | Admitting: *Deleted

## 2021-12-19 NOTE — Patient Outreach (Signed)
  Care Coordination [facility] Note Transition Care Management Unsuccessful Follow-up Telephone Call  Date of discharge and from where:  12/17/2021  Attempts:  1st Attempt  Reason for unsuccessful TCM follow-up call:  Left voice message  Chong Sicilian, BSN, RN-BC RN Care Coordinator Direct Dial: [phone]

## 2021-12-19 NOTE — Progress Notes (Signed)
Remote pacemaker transmission.   

## 2021-12-20 ENCOUNTER — Other Ambulatory Visit: Payer: Self-pay

## 2021-12-20 LAB — SURGICAL PATHOLOGY

## 2021-12-20 NOTE — Patient Outreach (Signed)
  Care Coordination [facility] Note Transition Care Management Unsuccessful Follow-up Telephone Call  Date of discharge and from where:  12/17/21 [facility]  Attempts:  2nd Attempt  Reason for unsuccessful TCM follow-up call:  Left voice message Peter Garter RN, [facility] Management [phone]

## 2021-12-21 ENCOUNTER — Other Ambulatory Visit: Payer: Self-pay

## 2021-12-21 NOTE — Patient Outreach (Signed)
  Care Coordination [facility] Note Transition Care Management Follow-up Telephone Call Date of discharge and from where: 12/17/21 [facility] How have you been since you were released from the hospital? She has been doing good  Any questions or concerns? No  Items Reviewed: Did the pt receive and understand the discharge instructions provided? Yes  Medications obtained and verified? Yes  Other? No  Any new allergies since your discharge? No  Dietary orders reviewed? Yes Do you have support at home? Yes   Home Care and Equipment/Supplies: Were home health services ordered? no If so, what is the name of the agency? N/a  Has the agency set up a time to come to the patient's home? not applicable Were any new equipment or medical supplies ordered?  No What is the name of the medical supply agency? N/a Were you able to get the supplies/equipment? not applicable Do you have any questions related to the use of the equipment or supplies? No  Functional Questionnaire: (I = Independent and D = Dependent) ADLs: I  Bathing/Dressing- I  Meal Prep- I  Eating- I  Maintaining continence- I  Transferring/Ambulation- I  Managing Meds- I  Follow up appointments reviewed:  PCP Hospital f/u appt confirmed? No   Specialist Hospital f/u appt confirmed? Yes  Scheduled to see Dr.Fogleman on 12/26/21 @ 2PM. Are transportation arrangements needed? Yes  If their condition worsens, is the pt aware to call PCP or go to the Emergency Dept.? Yes Was the patient provided with contact information for the PCP's office or ED? Yes Was to pt encouraged to call back with questions or concerns? Yes  SDOH assessments and interventions completed:   Yes  Care Coordination Interventions Activated:  Yes   Care Coordination Interventions:   Reviewed to attend follow up appointment     Encounter Outcome:  Pt. Visit Completed   Peter Garter RN, BSN,CCM, [place] Management Coordinator [place]  Management [phone]

## 2022-01-06 ENCOUNTER — Telehealth: Payer: Self-pay | Admitting: Pharmacist

## 2022-01-06 NOTE — Progress Notes (Signed)
    Chronic Care Management Pharmacy Assistant   Name: Adonica Fukushima  MRN: [id number] DOB: 1955-12-01  01/06/22 APPOINTMENT REMINDER   Called Patients Mother No answer, left message of appointment on 01/09/22 at 4 via telephone visit with Jeni Salles, Pharm D.   Notified to have all medications, supplements, blood pressure and/or blood sugar logs available during appointment and to return call if need to reschedule.      Care Gaps: HIV Screen - Overdue COVID Booster - Overdue Pap Smear - Overdue TDAP - Overdue Colonoscopy - Overdue Flu Vaccine - Overdue BP- 144/78 7/23 AWV- 8/23  Star Rating Drug:  None    Medications: Outpatient Encounter Medications as of 01/06/2022  Medication Sig   acetaminophen (TYLENOL) 325 MG tablet Take 650 mg by mouth every 6 (six) hours as needed for mild pain or moderate pain.   cholecalciferol (VITAMIN D3) 25 MCG (1000 UNIT) tablet Take 1,000 Units by mouth 2 (two) times daily.   folic acid (FOLVITE) 1 MG tablet Take 1 mg by mouth daily.   ibuprofen (ADVIL) 600 MG tablet Take 1 tablet (600 mg total) by mouth every 6 (six) hours.   LamoTRIgine 300 MG TB24 24 hour tablet Take 1 tablet by mouth twice daily   levETIRAcetam (KEPPRA) 500 MG tablet Take 1 tablet (500 mg total) by mouth 2 (two) times daily.   Magnesium 400 MG CAPS Take 400 mg by mouth daily.   menthol-cetylpyridinium (CEPACOL) 3 MG lozenge Take 1 lozenge (3 mg total) by mouth every 4 (four) hours as needed for sore throat.   Multiple Vitamin (MULTIVITAMIN WITH MINERALS) TABS tablet Take 2 tablets by mouth daily.   vitamin C (ASCORBIC ACID) 500 MG tablet Take 500 mg by mouth daily.   No facility-administered encounter medications on file as of 01/06/2022.       [place] Clinical Pharmacist Assistant [phone]

## 2022-01-06 NOTE — Chronic Care Management (AMB) (Signed)
A user error has taken place: encounter opened in error, closed for administrative reasons.

## 2022-01-09 ENCOUNTER — Ambulatory Visit: Payer: Medicare Other | Admitting: Pharmacist

## 2022-01-09 DIAGNOSIS — I10 Essential (primary) hypertension: Secondary | ICD-10-CM

## 2022-01-09 DIAGNOSIS — G40009 Localization-related (focal) (partial) idiopathic epilepsy and epileptic syndromes with seizures of localized onset, not intractable, without status epilepticus: Secondary | ICD-10-CM

## 2022-01-09 NOTE — Progress Notes (Signed)
Chronic Care Management Pharmacy Note  01/17/2022 Name:  [name] [name] MRN:  [id number] DOB:  October 30, 1955  Summary: BP not at goal < 140/90 per recent office readings but at goal per home readings  Recommendations/Changes made from today's visit: -Recommended monitoring BP at home regularly -Recommend bringing BP cuff to next office visit  Plan: Scheduled TOC visit with new PCP in 1 month BP assessment in 4-5 months   Subjective: [name] [name] is an 66 y.o. year old female who is a primary patient of Koberlein, Steele Berg, MD (Inactive).  The CCM team was consulted for assistance with disease management and care coordination needs.    Engaged with patient by telephone for follow up visit in response to provider referral for pharmacy case management and/or care coordination services.   Consent to Services:  The patient was given information about Chronic Care Management services, agreed to services, and gave verbal consent prior to initiation of services.  Please see initial visit note for detailed documentation.   Patient Care Team: [name], MD (Inactive) as PCP - General (Family Medicine) Aloha Gell, MD as Consulting Physician (Obstetrics and Gynecology) Pieter Partridge, DO as Consulting Physician (Neurology) Viona Gilmore, [facility] as Pharmacist (Pharmacist)  Recent office visits: 11/29/21 Glenna Durand, LPN: Patient presented for AWV.  04/13/21 Micheline Rough MD - Patient was seen for preventative health care and additional issues. No medication changes. Follow up in 6 months.   Recent consult visits: 11/23/21 Tommye Standard, PA-C (cardiology): Patient presented for medtronic pacemaker follow up.  Plan for STAT echo for pre-op clearance. Follow up in 1 year.  11/14/21 Metta Clines DO (neurology) - Patient was seen for Localization-related idiopathic epilepsy and epileptic syndromes with seizures of localized onset, not intractable, without status epilepticus. No medication  changes.    02/08/2021 [facility] [place] (optometry) - Patient was seen for Unspecified chorioretinal scars, bilateral and additional issues.   Hospital visits: 8/17-8/19/23 Patient admitted to [facility] for high grade squamous intraepithelial lesion and planned robotic assisted total laparoscopic hysterectomy. Due to complications, proceeded with total abdominal hysterectomy and bilateral salpingo-oophorectomy.   Objective:  Lab Results  Component Value Date   CREATININE 0.84 12/16/2021   BUN 11 12/16/2021   GFR 83.70 06/06/2021   GFRNONAA >60 12/16/2021   GFRAA >90 03/29/2014   NA 138 12/16/2021   K 4.9 12/16/2021   CALCIUM 9.1 12/16/2021   CO2 24 12/16/2021   GLUCOSE 104 (H) 12/16/2021    Lab Results  Component Value Date/Time   HGBA1C 5.5 12/06/2020 01:36 PM   HGBA1C 5.0 10/08/2020 03:42 PM   GFR 83.70 06/06/2021 02:45 PM   GFR 78.72 04/13/2021 03:42 PM    Last diabetic Eye exam: No results found for: "HMDIABEYEEXA"  Last diabetic Foot exam: No results found for: "HMDIABFOOTEX"   Lab Results  Component Value Date   CHOL 210 (H) 10/08/2020   HDL 81 10/08/2020   LDLCALC 115 (H) 10/08/2020   TRIG 46 10/08/2020   CHOLHDL 2.6 10/08/2020       Latest Ref Rng & Units 04/13/2021    3:42 PM 12/06/2020    1:36 PM 10/08/2020    3:42 PM  Hepatic Function  Total Protein 6.0 - 8.3 g/dL 7.8  7.4  7.4   Albumin 3.5 - 5.2 g/dL 4.3  4.4    AST 0 - 37 U/L '26  23  19   ' ALT 0 - 35 U/L '20  16  16   ' Alk  Phosphatase 39 - 117 U/L 101  92    Total Bilirubin 0.2 - 1.2 mg/dL 0.4  0.4  0.4   Bilirubin, Direct 0.0 - 0.2 mg/dL   0.1     Lab Results  Component Value Date/Time   TSH 1.07 04/13/2021 03:42 PM   TSH 1.30 01/09/2020 10:26 AM       Latest Ref Rng & Units 12/16/2021    5:39 AM 12/12/2021    1:39 PM 04/13/2021    3:42 PM  CBC  WBC 4.0 - 10.5 K/uL 9.3  6.3  4.8   Hemoglobin 12.0 - 15.0 g/dL 12.4  13.4  12.8   Hematocrit 36.0 - 46.0 % 37.4  40.6  38.0   Platelets  150 - 400 K/uL 177  244  267.0     Lab Results  Component Value Date/Time   VD25OH 61.26 04/13/2021 03:42 PM   VD25OH 62 01/09/2020 10:26 AM    Clinical ASCVD: No  The 10-year ASCVD risk score (Arnett DK, et al., 2019) is: 5.5%   Values used to calculate the score:     Age: 15 years     Sex: Female     Is Non-Hispanic African American: No     Diabetic: No     Tobacco smoker: No     Systolic Blood Pressure: 491 mmHg     Is BP treated: No     HDL Cholesterol: 81 mg/dL     Total Cholesterol: 210 mg/dL       11/29/2021    2:47 PM 04/13/2021    2:44 PM 11/16/2020    2:41 PM  Depression screen PHQ 2/9  Decreased Interest 0  0  Down, Depressed, Hopeless 0 1 1  PHQ - 2 Score 0 1 1  Altered sleeping  0   Tired, decreased energy  1   Change in appetite  0   Feeling bad or failure about yourself   0   Trouble concentrating  0   Moving slowly or fidgety/restless  0   PHQ-9 Score  2       Social History   Tobacco Use  Smoking Status Never  Smokeless Tobacco Never   BP Readings from Last 3 Encounters:  12/17/21 137/74  12/12/21 (!) 145/86  11/29/21 122/70   Pulse Readings from Last 3 Encounters:  12/17/21 66  12/12/21 69  11/29/21 74   Wt Readings from Last 3 Encounters:  12/15/21 176 lb (79.8 kg)  12/12/21 175 lb (79.4 kg)  11/29/21 180 lb (81.6 kg)   BMI Readings from Last 3 Encounters:  12/15/21 32.19 kg/m  12/12/21 32.01 kg/m  11/29/21 32.92 kg/m    Assessment/Interventions: Review of patient past medical history, allergies, medications, health status, including review of consultants reports, laboratory and other test data, was performed as part of comprehensive evaluation and provision of chronic care management services.   SDOH:  (Social Determinants of Health) assessments and interventions performed: Yes SDOH Interventions    Flowsheet Row Chronic Care Management from 01/09/2022 in [place] at [facility] Patient Outreach Telephone from  12/21/2021 in [place] Management from 08/10/2021 in [place] at [place] Interventions -- Intervention Not Indicated --  Transportation Interventions -- Intervention Not Indicated Intervention Not Indicated  Financial Strain Interventions Intervention Not Indicated -- Intervention Not Indicated      SDOH Screenings   Food Insecurity: No Food Insecurity (12/21/2021)  Housing:  Low Risk  (11/16/2020)  Transportation Needs: No Transportation Needs (12/21/2021)  Depression (PHQ2-9): Low Risk  (11/29/2021)  Financial Resource Strain: Low Risk  (01/09/2022)  Physical Activity: Insufficiently Active (11/29/2021)  Social Connections: Socially Isolated (11/16/2020)  Stress: No Stress Concern Present (11/29/2021)  Tobacco Use: Low Risk  (12/21/2021)    CCM Care Plan  No Known Allergies  Medications Reviewed Today     Reviewed by Dimitri Ped, RN (Registered Nurse) on 12/21/21 at 1141  Med List Status: <None>   Medication Order Taking? Sig Documenting Provider Last Dose Status Informant  acetaminophen (TYLENOL) 325 MG tablet [id number] No Take 650 mg by mouth every 6 (six) hours as needed for mild pain or moderate pain. [provider] Past Week Active Self, Other  cholecalciferol (VITAMIN D3) 25 MCG (1000 UNIT) tablet [id number] No Take 1,000 Units by mouth 2 (two) times daily. [provider] 12/14/2021 Active Self, Other  folic acid (FOLVITE) 1 MG tablet [id number] No Take 1 mg by mouth daily. [provider] 12/14/2021 Active Self, Other  ibuprofen (ADVIL) 600 MG tablet [id number]  Take 1 tablet (600 mg total) by mouth every 6 (six) hours. Aloha Gell, MD  Active   LamoTRIgine 300 MG TB24 24 hour tablet [id number] No Take 1 tablet by mouth twice daily Pieter Partridge, DO 12/15/2021 Active Self, Other  levETIRAcetam (KEPPRA) 500 MG tablet [id number] No Take 1 tablet (500  mg total) by mouth 2 (two) times daily. Pieter Partridge, DO 12/15/2021 Active Self, Other  Magnesium 400 MG CAPS [id number] No Take 400 mg by mouth daily. [provider] 12/14/2021 Active Self, Other  menthol-cetylpyridinium (CEPACOL) 3 MG lozenge [id number]  Take 1 lozenge (3 mg total) by mouth every 4 (four) hours as needed for sore throat. Aloha Gell, MD  Active   Multiple Vitamin (MULTIVITAMIN WITH MINERALS) TABS tablet [id number] No Take 2 tablets by mouth daily. [provider] 12/14/2021 Active Self, Other  oxyCODONE (OXY IR/ROXICODONE) 5 MG immediate release tablet [id number]  Take 1-2 tablets (5-10 mg total) by mouth every 6 (six) hours as needed for up to 7 days for moderate pain. Do not take more than 5 tablets in 1 day Charyl Bigger, MD  Active   vitamin C (ASCORBIC ACID) 500 MG tablet [id number] No Take 500 mg by mouth daily. 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   Hx of adenomatous colonic polyps 11/19/2016   Localization-related idiopathic epilepsy and epileptic syndromes with seizures of localized onset, not intractable, without status epilepticus ([place]) 01/01/2015   Drop attack 01/01/2015   Dizziness and giddiness 01/01/2015   Partial epilepsy with impairment of consciousness ([place]) 09/12/2013   Pacemaker-dual chamber medtronic 10/25/2012   Deaf 08/26/2012   Migraine 08/26/2012   Sinus node dysfunction ([place]) 08/26/2012   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 Patient's mom recently bought a new BP cuff and it's an arm cuff. Patient had her surgery and the mother did check the BP some since then.   Conditions to be addressed/monitored:  Hypertension and Seizures  Conditions addressed this visit: Hypertension, seizures   Care Plan : [place]  Updates made by Viona Gilmore, [place] since 01/17/2022 12:00 AM     Problem: Problem: Hypertension and Seizures      Long-Range Goal: Patient-Specific Goal   Start Date: 08/10/2021  Expected End Date: 08/11/2022  Recent Progress: On track  Priority: High  Note:   Current Barriers:  Unable to independently monitor therapeutic efficacy Unable to maintain control of blood pressure  Pharmacist Clinical Goal(s):  Patient will achieve adherence to monitoring guidelines and medication adherence to achieve therapeutic efficacy maintain control of blood pressure as evidenced by home and office BP readings  through collaboration with PharmD and provider.   Interventions: 1:1 collaboration with [name], MD regarding development and update of comprehensive plan of care as evidenced by provider attestation and co-signature Inter-disciplinary care team collaboration (see longitudinal plan of care) Comprehensive medication review performed; medication list updated in electronic medical record  Hypertension (BP goal <140/90) -Controlled -Current treatment: No medications -Medications previously tried: amlodipine (never started)  -Current home readings: 110/72, 8/5 109/68, 8/25 120/65, 8/31 119/69, 9/7 136/78   -Current dietary habits: she is not adding any extra salt to food -Current exercise habits: walking some but nervous to due to previous falls -Denies hypotensive/hypertensive symptoms -Educated on BP goals and benefits of medications for prevention of heart attack, stroke and kidney damage; Importance of home blood pressure monitoring; Proper BP monitoring technique; -Counseled to monitor BP at home twice weekly, document, and provide log at future appointments -Counseled on diet and exercise extensively Recommended continuing BP monitoring at home regularly.  Seizures (Goal: prevent seizures) -Controlled -Current treatment  Lamotrigine 300 mg 1 tablet twice daily - Appropriate, Effective, Safe, Accessible Levetiracetam 500 mg 1 tablet twice daily - Appropriate, Effective, Safe, Accessible -Medications previously tried: oxcarbazepine (hyponatremia) -Recommended to continue current medication  Health Maintenance -Vaccine gaps: COVID booster, tetanus, influenza vaccine -Current therapy:  Folic acid 1 mg 1 tablet daily  Magnesium 400 mg 1 tablet daily Multivitamin 1 tablet daily (200 mg of vitamin C, 40 mcg of vitamin D, 500 mg of calcium) Vitamin D 1000 units 2 capsules daily Vitamin C 500 mg 1 tablet daily -Educated on Cost vs benefit of each product must be carefully weighed by individual consumer -Patient is satisfied with current therapy and denies issues -Recommended to continue current medication  Patient Goals/Self-Care Activities Patient will:  - take medications as prescribed as evidenced by patient report and record review check blood pressure a few times a week, document, and provide at future appointments target a minimum of 150 minutes of moderate intensity exercise weekly  Follow Up Plan: The care management team will reach out to the patient again over the next 120 days.          Medication Assistance: None required.  Patient affirms current coverage meets  needs.  Compliance/Adherence/Medication fill history: Care Gaps: COVID booster, PAP smear, DEXA, mammogram, HIV screening, tetanus, influenza, colonoscopy Last BP - 144/78 7/23 Last A1C - 5.5 on 12/06/2020  Star-Rating Drugs: None  Patient's preferred pharmacy is:  [facility] -  [address], [place] [address] Phone: [phone] Fax: [phone]  Uses pill box? Yes Pt endorses 100% compliance  We discussed: Current pharmacy is preferred with insurance plan and patient is satisfied with pharmacy services Patient decided to: Continue current medication management strategy  Care Plan and Follow Up Patient Decision:  Patient agrees to Care Plan and Follow-up.  Plan: The care management team will reach out to the patient again over the next 180 days.  Jeni Salles, PharmD, [place] Pharmacist [place] at [place]

## 2022-01-16 ENCOUNTER — Encounter: Payer: Self-pay | Admitting: Internal Medicine

## 2022-02-02 ENCOUNTER — Encounter: Payer: Self-pay | Admitting: Family Medicine

## 2022-02-02 ENCOUNTER — Ambulatory Visit (INDEPENDENT_AMBULATORY_CARE_PROVIDER_SITE_OTHER): Payer: Medicare Other | Admitting: Family Medicine

## 2022-02-02 VITALS — BP 108/80 | HR 66 | Temp 97.9°F | Ht 62.0 in | Wt 175.2 lb

## 2022-02-02 DIAGNOSIS — R739 Hyperglycemia, unspecified: Secondary | ICD-10-CM

## 2022-02-02 DIAGNOSIS — I10 Essential (primary) hypertension: Secondary | ICD-10-CM | POA: Diagnosis not present

## 2022-02-02 NOTE — Progress Notes (Signed)
Established Patient Office Visit  Subjective   Patient ID: [name] [name], female    DOB: 1955/07/16  Age: 66 y.o. MRN: [id number]  Chief Complaint  Patient presents with   Transitions Of Care    Patient is here for transition of care visit. Patient reports a history of partial seizures and she is doing well on her current medication. States she has followed with neurology for this and has not had any seizures since her last visit. She had mentioned at the last visit she was having some balance issues but this seems to be improving. States that since Dr. [name] changed her seizure medications (stopped lamotrigine, using Keppra only) that the balance has improved. Reports no recent falls.   We reviewed her health maintenance measures, she is scheduled for DEXA and mammogram next week, states she did receive a letter for her repeat colonoscopy -- she wants to wait until next year to have this done.   Patient had a recent hysterectomy in August due to CIN grade 3 status. She reports she healed well, has no new symptoms or issues to report after the surgery.    Current Outpatient Medications  Medication Instructions   acetaminophen (TYLENOL) 650 mg, Oral, Every 6 hours PRN   ascorbic acid (VITAMIN C) 500 mg, Oral, Daily   cholecalciferol (VITAMIN D3) 1,000 Units, Oral, 2 times daily   folic acid (FOLVITE) 1 mg, Oral, Daily   ibuprofen (ADVIL) 600 mg, Oral, Every 6 hours   LamoTRIgine 300 MG TB24 24 hour tablet Take 1 tablet by mouth twice daily   levETIRAcetam (KEPPRA) 500 mg, Oral, 2 times daily   Magnesium 400 mg, Oral, Daily   menthol-cetylpyridinium (CEPACOL) 3 mg, Oral, Every 4 hours PRN   Multiple Vitamin (MULTIVITAMIN WITH MINERALS) TABS tablet 2 tablets, Oral, Daily    Patient Active Problem List   Diagnosis Date Noted   High grade squamous intraepithelial lesion (HGSIL), grade 3 CIN, on biopsy of cervix 12/15/2021   S/P hysterectomy with oophorectomy 12/15/2021   Balance disorder  04/28/2021   Hyperglycemia 04/13/2021   Hypertension 04/13/2021   Squamous cell carcinoma, 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     Objective:     BP 108/80   Pulse 66   Temp 97.9 F (36.6 C) (Oral)   Ht '5\' 2"'$  (1.575 m)   Wt 175 lb 4 oz (79.5 kg)   LMP 08/29/2005   SpO2 99%   BMI 32.05 kg/m    Physical Exam Vitals reviewed.  Constitutional:      Appearance: Normal appearance. She is well-groomed and normal weight.  HENT:     Head: Normocephalic and atraumatic.  Eyes:     Conjunctiva/sclera: Conjunctivae normal.  Cardiovascular:     Rate and Rhythm: Normal rate and regular rhythm.     Pulses: Normal pulses.     Heart sounds: S1 normal and S2 normal.  Pulmonary:     Effort: Pulmonary effort is normal.  Breath sounds: Normal breath sounds and air entry.  Abdominal:     General: Bowel sounds are normal.  Musculoskeletal:        General: Normal range of motion.     Cervical back: Normal range of motion and neck supple.     Right lower leg: No edema.     Left lower leg: No edema.  Skin:    General: Skin is warm and dry.  Neurological:     Mental Status: She is alert and oriented to person, place, and time. Mental status is at baseline.     Gait: Gait is intact.  Psychiatric:        Mood and Affect: Mood and affect normal.        Speech: Speech normal.        Behavior: Behavior normal.        Judgment:  Judgment normal.      No results found for any visits on 02/02/22.  Last CBC Lab Results  Component Value Date   WBC 9.3 12/16/2021   HGB 12.4 12/16/2021   HCT 37.4 12/16/2021   MCV 91.2 12/16/2021   MCH 30.2 12/16/2021   RDW 14.1 12/16/2021   PLT 177 27/09/2374   Last metabolic panel Lab Results  Component Value Date   GLUCOSE 104 (H) 12/16/2021   NA 138 12/16/2021   K 4.9 12/16/2021   CL 106 12/16/2021   CO2 24 12/16/2021   BUN 11 12/16/2021   CREATININE 0.84 12/16/2021   GFRNONAA >60 12/16/2021   CALCIUM 9.1 12/16/2021   PROT 7.8 04/13/2021   ALBUMIN 4.3 04/13/2021   BILITOT 0.4 04/13/2021   ALKPHOS 101 04/13/2021   AST 26 04/13/2021   ALT 20 04/13/2021   ANIONGAP 8 12/16/2021      The 10-year ASCVD risk score (Arnett DK, et al., 2019) is: 3.5%    Assessment & Plan:   Problem List Items Addressed This Visit       Cardiovascular and Mediastinum   Hypertension - Primary    Patient not currently on medications, BP today is WNL. Will continue to monitor at each visit.         Other   Hyperglycemia    Elevated fasting glucose seen on labs from August 2023, previous A1C's have been WNL. Will continue to monitor. She will need to return in 6 months for her Annual Physical Exam. Will recheck lipid panel and A1C then.        Return in about 6 months (around 08/04/2022) for Annual Physical Exam.    Farrel Conners, MD

## 2022-02-03 ENCOUNTER — Other Ambulatory Visit: Payer: Self-pay | Admitting: Family Medicine

## 2022-02-03 DIAGNOSIS — E2839 Other primary ovarian failure: Secondary | ICD-10-CM

## 2022-02-03 NOTE — Assessment & Plan Note (Signed)
Patient not currently on medications, BP today is WNL. Will continue to monitor at each visit.

## 2022-02-03 NOTE — Assessment & Plan Note (Signed)
Elevated fasting glucose seen on labs from August 2023, previous A1C's have been WNL. Will continue to monitor. She will need to return in 6 months for her Annual Physical Exam. Will recheck lipid panel and A1C then.

## 2022-02-07 ENCOUNTER — Other Ambulatory Visit: Payer: Medicare Other

## 2022-02-07 ENCOUNTER — Ambulatory Visit
Admission: RE | Admit: 2022-02-07 | Discharge: 2022-02-07 | Disposition: A | Discharge status: Home / Self Care | Payer: Medicare Other | Source: Ambulatory Visit | Attending: Family Medicine | Admitting: Family Medicine

## 2022-02-07 DIAGNOSIS — Z1231 Encounter for screening mammogram for malignant neoplasm of breast: Secondary | ICD-10-CM | POA: Diagnosis not present

## 2022-02-21 ENCOUNTER — Ambulatory Visit (INDEPENDENT_AMBULATORY_CARE_PROVIDER_SITE_OTHER): Payer: Medicare Other

## 2022-02-21 DIAGNOSIS — I495 Sick sinus syndrome: Secondary | ICD-10-CM

## 2022-02-22 LAB — CUP PACEART REMOTE DEVICE CHECK
Battery Impedance: 1625 Ohm
Battery Remaining Longevity: 52 mo
Battery Voltage: 2.75 V
Brady Statistic AP VP Percent: 0 %
Brady Statistic AP VS Percent: 0 %
Brady Statistic AS VP Percent: 0 %
Brady Statistic AS VS Percent: 100 %
Date Time Interrogation Session: 20231024112410
Implantable Lead Connection Status: 753985
Implantable Lead Connection Status: 753985
Implantable Lead Implant Date: 20120409
Implantable Lead Implant Date: 20120409
Implantable Lead Location: 753859
Implantable Lead Location: 753860
Implantable Lead Model: 4076
Implantable Lead Model: 4076
Implantable Pulse Generator Implant Date: 20120409
Lead Channel Impedance Value: 563 Ohm
Lead Channel Impedance Value: 606 Ohm
Lead Channel Pacing Threshold Amplitude: 0.625 V
Lead Channel Pacing Threshold Amplitude: 0.625 V
Lead Channel Pacing Threshold Pulse Width: 0.4 ms
Lead Channel Pacing Threshold Pulse Width: 0.4 ms
Lead Channel Setting Pacing Amplitude: 2 V
Lead Channel Setting Pacing Amplitude: 2.5 V
Lead Channel Setting Pacing Pulse Width: 0.4 ms
Lead Channel Setting Sensing Sensitivity: 5.6 mV
Zone Setting Status: 755011
Zone Setting Status: 755011

## 2022-03-10 NOTE — Progress Notes (Signed)
Remote pacemaker transmission.   

## 2022-05-23 ENCOUNTER — Ambulatory Visit: Payer: Medicare Other | Attending: Internal Medicine

## 2022-05-23 DIAGNOSIS — I495 Sick sinus syndrome: Secondary | ICD-10-CM | POA: Diagnosis not present

## 2022-05-23 LAB — CUP PACEART REMOTE DEVICE CHECK
Battery Impedance: 1710 Ohm
Battery Remaining Longevity: 50 mo
Battery Voltage: 2.75 V
Brady Statistic AP VP Percent: 0 %
Brady Statistic AP VS Percent: 0 %
Brady Statistic AS VP Percent: 0 %
Brady Statistic AS VS Percent: 100 %
Date Time Interrogation Session: 20240123131608
Implantable Lead Connection Status: 753985
Implantable Lead Connection Status: 753985
Implantable Lead Implant Date: 20120409
Implantable Lead Implant Date: 20120409
Implantable Lead Location: 753859
Implantable Lead Location: 753860
Implantable Lead Model: 4076
Implantable Lead Model: 4076
Implantable Pulse Generator Implant Date: 20120409
Lead Channel Impedance Value: 556 Ohm
Lead Channel Impedance Value: 652 Ohm
Lead Channel Pacing Threshold Amplitude: 0.625 V
Lead Channel Pacing Threshold Amplitude: 0.625 V
Lead Channel Pacing Threshold Pulse Width: 0.4 ms
Lead Channel Pacing Threshold Pulse Width: 0.4 ms
Lead Channel Setting Pacing Amplitude: 2 V
Lead Channel Setting Pacing Amplitude: 2.5 V
Lead Channel Setting Pacing Pulse Width: 0.4 ms
Lead Channel Setting Sensing Sensitivity: 5.6 mV
Zone Setting Status: 755011
Zone Setting Status: 755011

## 2022-06-06 ENCOUNTER — Telehealth: Payer: Self-pay

## 2022-06-06 NOTE — Progress Notes (Signed)
Care Management & Coordination Services Pharmacy Team  Reason for Encounter: Hypertension  Contacted patient to discuss hypertension disease state. Spoke with family on 06/06/2022 Spoke with patients mother [name] [name].  Current antihypertensive regimen:  No hypertension medications   How often are you checking your Blood Pressure? Patients mother [name] [name] states she has not been checking her blood pressure recently  Current home BP readings:  Patients mother states she has not been checking regularly, she doesn't have a log of her readings however, states they are always in a normal range around 120/80  Wrist or arm cuff: Arm  OTC medications including pseudoephedrine or NSAIDs? Patients mother denies.  Any readings above 180/100? No  What recent interventions/DTPs have been made by any provider to improve Blood Pressure control since last CPP Visit: No recent interventions noted.  Any recent hospitalizations or ED visits since last visit with CPP? No recent hospital visits noted.  What diet changes have been made to improve Blood Pressure Control?  Patient eats primarily meats and vegetables, very little sweets.  Caffeine intake: Patient drinks one diet Dr. Malachi Bonds per day and a tea Salt intake:Patient does add salt to her food  What exercise is being done to improve your Blood Pressure Control?  Patient is up and moving throughout the day, nothing structured   Adherence Review: Is the patient currently on ACE/ARB medication? No Does the patient have >5 day gap between last estimated fill dates? No  Star Rating Drugs:  None  Chart Updates:  Recent office visits:  02/02/2022 Loralyn Freshwater MD - Patient was seen for primary hypertension and additional concern. No medication changes.   Recent consult visits:  None  Hospital visits:  None  Medications: Outpatient Encounter Medications as of 06/06/2022  Medication Sig   acetaminophen (TYLENOL) 325 MG tablet Take 650 mg by mouth  every 6 (six) hours as needed for mild pain or moderate pain.   cholecalciferol (VITAMIN D3) 25 MCG (1000 UNIT) tablet Take 1,000 Units by mouth 2 (two) times daily.   folic acid (FOLVITE) 1 MG tablet Take 1 mg by mouth daily.   ibuprofen (ADVIL) 600 MG tablet Take 1 tablet (600 mg total) by mouth every 6 (six) hours.   LamoTRIgine 300 MG TB24 24 hour tablet Take 1 tablet by mouth twice daily   levETIRAcetam (KEPPRA) 500 MG tablet Take 1 tablet (500 mg total) by mouth 2 (two) times daily.   Magnesium 400 MG CAPS Take 400 mg by mouth daily.   menthol-cetylpyridinium (CEPACOL) 3 MG lozenge Take 1 lozenge (3 mg total) by mouth every 4 (four) hours as needed for sore throat.   Multiple Vitamin (MULTIVITAMIN WITH MINERALS) TABS tablet Take 2 tablets by mouth daily.   vitamin C (ASCORBIC ACID) 500 MG tablet Take 500 mg by mouth daily.   No facility-administered encounter medications on file as of 06/06/2022.  Fill History:   Dispensed Days Supply Quantity Provider Pharmacy  FOLIC ACID '1MG'$       TAB 04/04/2022 90 90 each      Dispensed Days Supply Quantity Provider Pharmacy  IBUPROFEN '600MG'$      TAB 12/17/2021 15 60 each      Dispensed Days Supply Quantity Provider Pharmacy  LAMOTRIGINE ER '300MG'$  TAB 05/11/2022 30 60 each      Dispensed Days Supply Quantity Provider Pharmacy  LevETIRAcetam '500MG'$  TAB 06/04/2022 60 120 each     Recent Office Vitals: BP Readings from Last 3 Encounters:  02/02/22 108/80  12/17/21 137/74  12/12/21 Marland Kitchen)  145/86   Pulse Readings from Last 3 Encounters:  02/02/22 66  12/17/21 66  12/12/21 69    Wt Readings from Last 3 Encounters:  02/02/22 175 lb 4 oz (79.5 kg)  12/15/21 176 lb (79.8 kg)  12/12/21 175 lb (79.4 kg)     Kidney Function Lab Results  Component Value Date/Time   CREATININE 0.84 12/16/2021 05:39 AM   CREATININE 0.86 12/15/2021 09:22 PM   CREATININE 0.85 01/09/2020 10:26 AM   CREATININE 0.77 05/15/2017 09:58 AM   GFR 83.70 06/06/2021 02:45 PM    GFRNONAA >60 12/16/2021 05:39 AM   GFRNONAA 71 09/12/2013 04:16 PM   GFRAA >90 03/29/2014 07:33 PM   GFRAA 82 09/12/2013 04:16 PM       Latest Ref Rng & Units 12/16/2021    5:39 AM 12/15/2021    9:22 PM 12/12/2021    1:39 PM  BMP  Glucose 70 - 99 mg/dL 104  134  92   BUN 8 - 23 mg/dL '11  15  11   '$ Creatinine 0.44 - 1.00 mg/dL 0.84  0.86  0.99   Sodium 135 - 145 mmol/L 138  138  136   Potassium 3.5 - 5.1 mmol/L 4.9  5.0  4.6   Chloride 98 - 111 mmol/L 106  108  104   CO2 22 - 32 mmol/L '24  22  25   '$ Calcium 8.9 - 10.3 mg/dL 9.1  9.0  9.7    [place] Pharmacist Assistant [phone]

## 2022-06-20 NOTE — Progress Notes (Signed)
Remote pacemaker transmission.   

## 2022-07-09 ENCOUNTER — Other Ambulatory Visit: Payer: Self-pay | Admitting: Neurology

## 2022-07-09 DIAGNOSIS — G40009 Localization-related (focal) (partial) idiopathic epilepsy and epileptic syndromes with seizures of localized onset, not intractable, without status epilepticus: Secondary | ICD-10-CM

## 2022-07-29 ENCOUNTER — Other Ambulatory Visit: Payer: Self-pay | Admitting: Neurology

## 2022-08-04 ENCOUNTER — Encounter: Payer: Self-pay | Admitting: Family Medicine

## 2022-08-04 ENCOUNTER — Ambulatory Visit (INDEPENDENT_AMBULATORY_CARE_PROVIDER_SITE_OTHER): Payer: Medicare Other | Admitting: Family Medicine

## 2022-08-04 VITALS — BP 122/78 | HR 67 | Temp 97.5°F | Ht 62.0 in | Wt 170.3 lb

## 2022-08-04 DIAGNOSIS — I10 Essential (primary) hypertension: Secondary | ICD-10-CM

## 2022-08-04 DIAGNOSIS — Z780 Asymptomatic menopausal state: Secondary | ICD-10-CM | POA: Diagnosis not present

## 2022-08-04 DIAGNOSIS — Z0000 Encounter for general adult medical examination without abnormal findings: Secondary | ICD-10-CM

## 2022-08-04 NOTE — Progress Notes (Signed)
Complete physical exam  Patient: [name] GeneralLisa [name]   DOB: 05/28/1955   67 y.o. Female  MRN: [id number]  Subjective:    Chief Complaint  Patient presents with  . Annual Exam    [name] GeneralLisa [name] is a 67 y.o. female who presents today for a complete physical exam. She reports consuming a general diet. Exercise is limited by orthopedic condition(s): had shoulder surgery, occasionally goes walking with her dogs. She generally feels well. She reports sleeping well. She does not have additional problems to discuss today.   Patient is reporting an increase in sadness, especially in disagreements, states she will have to "stay away" for a few days to let herself cool down. Has been going on the last 2 months. States that she sometimes gets into arguments with her mother who often has a different opinion. She states that she likes to watch movies, still has fun things she likes to do.   Most recent fall risk assessment:    08/04/2022    2:48 PM  Fall Risk   Falls in the past year? 0  Number falls in past yr: 0  Injury with Fall? 0  Risk for fall due to : No Fall Risks  Follow up Falls evaluation completed     Most recent depression screenings:    08/04/2022    2:48 PM 11/29/2021    2:47 PM  PHQ 2/9 Scores  PHQ - 2 Score 1 0   Eye exams every 2 years.  Dental: No current dental problems and Receives regular dental care  Patient Active Problem List   Diagnosis Date Noted  . High grade squamous intraepithelial lesion (HGSIL), grade 3 CIN, on biopsy of cervix 12/15/2021  . S/P hysterectomy with oophorectomy 12/15/2021  . Balance disorder 04/28/2021  . Hyperglycemia 04/13/2021  . Hypertension 04/13/2021  . 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 Hx of adenomatous colonic polyps 11/19/2016  . Localization-related idiopathic epilepsy and epileptic syndromes with seizures of localized onset, not intractable, without status epilepticus 01/01/2015  . 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 Dizziness and giddiness  01/01/2015  . Partial epilepsy with impairment of consciousness 09/12/2013  . 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 Deaf 08/26/2012  . Migraine 08/26/2012  . Sinus node dysfunction 08/26/2012  . H/O bilateral hip replacements 08/26/2012      Patient Care Team: Karie GeorgesMichael, Ahmani Daoud M, MD as PCP - General (Family Medicine) Noland FordyceFogleman, Kelly, MD as Consulting Physician (Obstetrics and Gynecology) Drema DallasJaffe, [name] R, DO as Consulting Physician (Neurology) Verner CholPryor, Madeline G, [facility] (Inactive) as Pharmacist (Pharmacist)   Outpatient Medications Prior to Visit  Medication Sig  . acetaminophen (TYLENOL) 325 MG tablet Take 650 mg by mouth every 6 (six) hours as needed for mild pain or moderate pain.  . cholecalciferol (VITAMIN D3) 25 MCG (1000 UNIT) tablet Take 1,000 Units by mouth 2 (two) times daily.  . folic acid (FOLVITE) 1 MG tablet Take 1 mg by mouth daily.  Marland Kitchen. ibuprofen (ADVIL) 600 MG tablet Take 1 tablet (600 mg total) by mouth every 6 (six) hours.  . LamoTRIgine 300 MG TB24 24 hour tablet Take 1 tablet by mouth twice daily  . levETIRAcetam (KEPPRA) 500 MG tablet Take 1 tablet by mouth twice daily  . Magnesium 400 MG CAPS Take 400 mg by mouth daily.  Marland Kitchen. menthol-cetylpyridinium (CEPACOL) 3 MG lozenge Take 1 lozenge (3 mg total) by mouth every 4 (four) hours as needed for sore throat.  . Multiple Vitamin (MULTIVITAMIN WITH MINERALS) TABS tablet Take 2  tablets by mouth daily.  . vitamin C (ASCORBIC ACID) 500 MG tablet Take 500 mg by mouth daily.   No facility-administered medications prior to visit.    Review of Systems  HENT:  Negative for hearing loss.   Eyes:  Negative for blurred vision.  Respiratory:  Negative for shortness of breath.   Cardiovascular:  Negative for chest pain.  Gastrointestinal: Negative.   Genitourinary: Negative.   Musculoskeletal:  Negative for back pain.  Neurological:  Negative for headaches.  Psychiatric/Behavioral:  Negative for depression.   All other  systems reviewed and are negative.         Objective:     BP 122/78 (BP Location: Left Arm, Patient Position: Sitting, Cuff Size: Large)   Pulse 67   Temp (!) 97.5 F (36.4 C) (Oral)   Ht 5\' 2"  (1.575 m)   Wt 170 lb 4.8 oz (77.2 kg)   LMP 08/29/2005   SpO2 98%   BMI 31.15 kg/m  {Vitals History (Optional):23777}  Physical Exam Vitals reviewed.  Constitutional:      Appearance: Normal appearance. She is well-groomed and normal weight.  Eyes:     Conjunctiva/sclera: Conjunctivae normal.  Neck:     Thyroid: No thyromegaly.  Cardiovascular:     Rate and Rhythm: Normal rate and regular rhythm.     Pulses: Normal pulses.     Heart sounds: S1 normal and S2 normal.  Pulmonary:     Effort: Pulmonary effort is normal.     Breath sounds: Normal breath sounds and air entry.  Abdominal:     General: Bowel sounds are normal.  Musculoskeletal:     Right lower leg: No edema.     Left lower leg: No edema.  Neurological:     Mental Status: She is alert and oriented to person, place, and time. Mental status is at baseline.     Gait: Gait is intact.  Psychiatric:        Mood and Affect: Mood and affect normal.        Speech: Speech normal.        Behavior: Behavior normal.        Judgment: Judgment normal.     No results found for any visits on 08/04/22. {Show previous labs (optional):23779}    Assessment & Plan:    Routine Health Maintenance and Physical Exam  Immunization History  Administered Date(s) Administered  . Influenza Inj Mdck Quad Pf 01/31/2018  . Influenza Split 01/29/2013, 01/20/2014  . Influenza,inj,Quad PF,6+ Mos 02/04/2015, 01/19/2016, 01/15/2017, 01/02/2019, 01/09/2020  . Influenza-Unspecified 01/29/2018, 12/30/2020  . PFIZER Comirnaty(Gray Top)Covid-19 Tri-Sucrose Vaccine 07/12/2019, 08/05/2019, 02/10/2020  . PNEUMOCOCCAL CONJUGATE-20 04/13/2021  . Td 09/30/2011  . Zoster Recombinat (Shingrix) 04/02/2019, 01/09/2020    Health Maintenance  Topic  Date Due  . DEXA SCAN  Never done  . DTaP/Tdap/Td (2 - Tdap) 09/29/2021  . COLONOSCOPY (Pts 45-45yrs Insurance coverage will need to be confirmed)  11/13/2021  . COVID-19 Vaccine (4 - 2023-24 season) 12/30/2021  . INFLUENZA VACCINE  11/30/2022  . Medicare Annual Wellness (AWV)  11/30/2022  . MAMMOGRAM  02/08/2024  . Pneumonia Vaccine 57+ Years old  Completed  . Hepatitis C Screening  Completed  . Zoster Vaccines- Shingrix  Completed  . HPV VACCINES  Aged Out    Discussed health benefits of physical activity, and encouraged her to engage in regular exercise appropriate for her age and condition.  Problem List Items Addressed This Visit       Unprioritized  Hypertension   Relevant Orders   CMP   Lipid Panel   Other Visit Diagnoses     Routine general medical examination at a health care facility    -  Primary   Postmenopausal state       Relevant Orders   DG Bone Density      Return in 6 months (on 02/03/2023) for follow up .     Karie GeorgesBarbara M Dianne Whelchel, MD

## 2022-08-04 NOTE — Patient Instructions (Addendum)
Ear drops for wax -- Debrox, place 2 drops in the right ear three times a week to help with wax.  Health Maintenance, Female Adopting a healthy lifestyle and getting preventive care are important in promoting health and wellness. Ask your health care provider about: The right schedule for you to have regular tests and exams. Things you can do on your own to prevent diseases and keep yourself healthy. What should I know about diet, weight, and exercise? Eat a healthy diet  Eat a diet that includes plenty of vegetables, fruits, low-fat dairy products, and lean protein. Do not eat a lot of foods that are high in solid fats, added sugars, or sodium. Maintain a healthy weight Body mass index (BMI) is used to identify weight problems. It estimates body fat based on height and weight. Your health care provider can help determine your BMI and help you achieve or maintain a healthy weight. Get regular exercise Get regular exercise. This is one of the most important things you can do for your health. Most adults should: Exercise for at least 150 minutes each week. The exercise should increase your heart rate and make you sweat (moderate-intensity exercise). Do strengthening exercises at least twice a week. This is in addition to the moderate-intensity exercise. Spend less time sitting. Even light physical activity can be beneficial. Watch cholesterol and blood lipids Have your blood tested for lipids and cholesterol at 67 years of age, then have this test every 5 years. Have your cholesterol levels checked more often if: Your lipid or cholesterol levels are high. You are older than 67 years of age. You are at high risk for heart disease. What should I know about cancer screening? Depending on your health history and family history, you may need to have cancer screening at various ages. This may include screening for: Breast cancer. Cervical cancer. Colorectal cancer. Skin cancer. Lung  cancer. What should I know about heart disease, diabetes, and high blood pressure? Blood pressure and heart disease High blood pressure causes heart disease and increases the risk of stroke. This is more likely to develop in people who have high blood pressure readings or are overweight. Have your blood pressure checked: Every 3-5 years if you are 67-99 years of age. Every year if you are 67 years old or older. Diabetes Have regular diabetes screenings. This checks your fasting blood sugar level. Have the screening done: Once every three years after age 55 if you are at a normal weight and have a low risk for diabetes. More often and at a younger age if you are overweight or have a high risk for diabetes. What should I know about preventing infection? Hepatitis B If you have a higher risk for hepatitis B, you should be screened for this virus. Talk with your health care provider to find out if you are at risk for hepatitis B infection. Hepatitis C Testing is recommended for: Everyone born from [birth year] through 1965. Anyone with known risk factors for hepatitis C. Sexually transmitted infections (STIs) Get screened for STIs, including gonorrhea and chlamydia, if: You are sexually active and are younger than 67 years of age. You are older than 67 years of age and your health care provider tells you that you are at risk for this type of infection. Your sexual activity has changed since you were last screened, and you are at increased risk for chlamydia or gonorrhea. Ask your health care provider if you are at risk. Ask your health care provider  about whether you are at high risk for HIV. Your health care provider may recommend a prescription medicine to help prevent HIV infection. If you choose to take medicine to prevent HIV, you should first get tested for HIV. You should then be tested every 3 months for as long as you are taking the medicine. Pregnancy If you are about to stop having your  period (premenopausal) and you may become pregnant, seek counseling before you get pregnant. Take 400 to 800 micrograms (mcg) of folic acid every day if you become pregnant. Ask for birth control (contraception) if you want to prevent pregnancy. Osteoporosis and menopause Osteoporosis is a disease in which the bones lose minerals and strength with aging. This can result in bone fractures. If you are 67 years old or older, or if you are at risk for osteoporosis and fractures, ask your health care provider if you should: Be screened for bone loss. Take a calcium or vitamin D supplement to lower your risk of fractures. Be given hormone replacement therapy (HRT) to treat symptoms of menopause. Follow these instructions at home: Alcohol use Do not drink alcohol if: Your health care provider tells you not to drink. You are pregnant, may be pregnant, or are planning to become pregnant. If you drink alcohol: Limit how much you have to: 0-1 drink a day. Know how much alcohol is in your drink. In the [place] one drink equals one 12 oz bottle of beer (355 mL), one 5 oz glass of wine (148 mL), or one 1 oz glass of hard liquor (44 mL). Lifestyle Do not use any products that contain nicotine or tobacco. These products include cigarettes, chewing tobacco, and vaping devices, such as e-cigarettes. If you need help quitting, ask your health care provider. Do not use street drugs. Do not share needles. Ask your health care provider for help if you need support or information about quitting drugs. General instructions Schedule regular health, dental, and eye exams. Stay current with your vaccines. Tell your health care provider if: You often feel depressed. You have ever been abused or do not feel safe at home. Summary Adopting a healthy lifestyle and getting preventive care are important in promoting health and wellness. Follow your health care provider's instructions about healthy diet, exercising, and  getting tested or screened for diseases. Follow your health care provider's instructions on monitoring your cholesterol and blood pressure. This information is not intended to replace advice given to you by your health care provider. Make sure you discuss any questions you have with your health care provider. Document Revised: 09/06/2020 Document Reviewed: 09/06/2020 Elsevier Patient Education  [employer].

## 2022-08-05 LAB — LIPID PANEL
Cholesterol: 219 mg/dL — ABNORMAL HIGH (ref <200)
HDL: 68 mg/dL (ref >=50)
LDL Cholesterol (Calc): 135 mg/dL (calc) — ABNORMAL HIGH
Non-HDL Cholesterol (Calc): 151 mg/dL (calc) — ABNORMAL HIGH (ref <130)
Total CHOL/HDL Ratio: 3.2 (calc) (ref <5.0)
Triglycerides: 72 mg/dL (ref <150)

## 2022-08-05 LAB — COMPREHENSIVE METABOLIC PANEL
AG Ratio: 1.6 (calc) (ref 1.0–2.5)
ALT: 16 U/L (ref 6–29)
AST: 21 U/L (ref 10–35)
Albumin: 4.7 g/dL (ref 3.6–5.1)
Alkaline phosphatase (APISO): 81 U/L (ref 37–153)
BUN: 10 mg/dL (ref 7–25)
CO2: 28 mmol/L (ref 20–32)
Calcium: 10.2 mg/dL (ref 8.6–10.4)
Chloride: 102 mmol/L (ref 98–110)
Creat: 0.94 mg/dL (ref 0.50–1.05)
Globulin: 2.9 g/dL (calc) (ref 1.9–3.7)
Glucose, Bld: 90 mg/dL (ref 65–99)
Potassium: 4.9 mmol/L (ref 3.5–5.3)
Sodium: 140 mmol/L (ref 135–146)
Total Bilirubin: 0.4 mg/dL (ref 0.2–1.2)
Total Protein: 7.6 g/dL (ref 6.1–8.1)

## 2022-08-10 ENCOUNTER — Telehealth: Payer: Self-pay

## 2022-08-10 ENCOUNTER — Telehealth: Payer: Self-pay | Admitting: Family Medicine

## 2022-08-10 NOTE — Telephone Encounter (Signed)
[facility] [phone]) and she stated the patient did not have a bone density at their office in October.  Left a detailed message for Ms Letitia Libra to let the patient know this and to call back with information as to the location she had the last bone density.

## 2022-08-10 NOTE — Progress Notes (Signed)
Care Management & Coordination Services Pharmacy Team  Reason for Encounter: Hypertension  Contacted patient to discuss hypertension disease state. Spoke with mother Alona Bene on 08/11/2022   Current antihypertensive regimen:  No hypertension medications   How often are you checking your Blood Pressure? Patients mother has been checking once or twice per month when patient is willing.   Current home BP readings: Last 3 readings were 122/70,  127/75,  132/74  Wrist or arm cuff: Arm   OTC medications including pseudoephedrine or NSAIDs? Patients mother denies.  Any readings above 180/100? No  What recent interventions/DTPs have been made by any provider to improve Blood Pressure control since last CPP Visit:  No recent interventions noted.   Any recent hospitalizations or ED visits since last visit with CPP? No recent hospital visits noted.   What diet changes have been made to improve Blood Pressure Control?  Patient eats primarily meats and vegetables, very little sweets.  Caffeine intake: Patient drinks one diet Dr. Reino Kent per day and a tea Salt intake:Patient does add salt to her food  What exercise is being done to improve your Blood Pressure Control?  Patient is up and moving throughout the day, nothing structured   Adherence Review: Is the patient currently on ACE/ARB medication? No Does the patient have >5 day gap between last estimated fill dates? No  Care Gaps: AWV - completed 11/29/2021 Last BP - 122/78 on 08/04/2022 Dexascan - never done Tdap - overdue Colonoscopy - overdue Covid - overdue  Star Rating Drugs:  None  Chart Updates: Recent office visits:  08/04/2022 Nira Conn MD - Patient was seen for Routine general medical examination at a health care facility and additional concerns. No medication changes.   Recent consult visits:  None  Hospital visits:  None  Medications: Outpatient Encounter Medications as of 08/10/2022  Medication Sig    acetaminophen (TYLENOL) 325 MG tablet Take 650 mg by mouth every 6 (six) hours as needed for mild pain or moderate pain.   cholecalciferol (VITAMIN D3) 25 MCG (1000 UNIT) tablet Take 1,000 Units by mouth 2 (two) times daily.   folic acid (FOLVITE) 1 MG tablet Take 1 mg by mouth daily.   ibuprofen (ADVIL) 600 MG tablet Take 1 tablet (600 mg total) by mouth every 6 (six) hours.   LamoTRIgine 300 MG TB24 24 hour tablet Take 1 tablet by mouth twice daily   levETIRAcetam (KEPPRA) 500 MG tablet Take 1 tablet by mouth twice daily   Magnesium 400 MG CAPS Take 400 mg by mouth daily.   menthol-cetylpyridinium (CEPACOL) 3 MG lozenge Take 1 lozenge (3 mg total) by mouth every 4 (four) hours as needed for sore throat.   Multiple Vitamin (MULTIVITAMIN WITH MINERALS) TABS tablet Take 2 tablets by mouth daily.   vitamin C (ASCORBIC ACID) 500 MG tablet Take 500 mg by mouth daily.   No facility-administered encounter medications on file as of 08/10/2022.  Fill History:  Dispensed Days Supply Quantity Provider Pharmacy  FOLIC ACID  1 MG TABS 07/09/2022 90 90 tablet      Dispensed Days Supply Quantity Provider Pharmacy  IBUPROFEN 600MG      TAB 12/17/2021 15 60 each      Dispensed Days Supply Quantity Provider Pharmacy  LevETIRAcetam 500MG  TAB 07/31/2022 60 120 each     Recent Office Vitals: BP Readings from Last 3 Encounters:  08/04/22 122/78  02/02/22 108/80  12/17/21 137/74   Pulse Readings from Last 3 Encounters:  08/04/22 67  02/02/22  66  12/17/21 66    Wt Readings from Last 3 Encounters:  08/04/22 170 lb 4.8 oz (77.2 kg)  02/02/22 175 lb 4 oz (79.5 kg)  12/15/21 176 lb (79.8 kg)     Kidney Function Lab Results  Component Value Date/Time   CREATININE 0.94 08/04/2022 03:35 PM   CREATININE 0.84 12/16/2021 05:39 AM   CREATININE 0.86 12/15/2021 09:22 PM   CREATININE 0.85 01/09/2020 10:26 AM   GFR 83.70 06/06/2021 02:45 PM   GFRNONAA >60 12/16/2021 05:39 AM   GFRNONAA 71 09/12/2013 04:16 PM    GFRAA >90 03/29/2014 07:33 PM   GFRAA 82 09/12/2013 04:16 PM       Latest Ref Rng & Units 08/04/2022    3:35 PM 12/16/2021    5:39 AM 12/15/2021    9:22 PM  BMP  Glucose 65 - 99 mg/dL 90  409104  [id number]   BUN 7 - 25 mg/dL 10  11  15    Creatinine 0.50 - 1.05 mg/dL 9.140.94  7.820.84  9.560.86   BUN/Creat Ratio 6 - 22 (calc) SEE NOTE:     Sodium 135 - 146 mmol/L 140  138  138   Potassium 3.5 - 5.3 mmol/L 4.9  4.9  5.0   Chloride 98 - 110 mmol/L 102  106  108   CO2 20 - 32 mmol/L 28  24  22    Calcium 8.6 - 10.4 mg/dL 21.310.2  9.1  9.0    Inetta FermoJanie Johnson [facility] BaltimoreCMA  Clinical Lobbyistharmacist Assistant [phone]

## 2022-08-10 NOTE — Telephone Encounter (Signed)
Pt was told to have bone density test and the last one she had was oct 10,2023 and [facility]. Please advise

## 2022-08-13 ENCOUNTER — Other Ambulatory Visit: Payer: Self-pay | Admitting: Neurology

## 2022-08-13 DIAGNOSIS — G40009 Localization-related (focal) (partial) idiopathic epilepsy and epileptic syndromes with seizures of localized onset, not intractable, without status epilepticus: Secondary | ICD-10-CM

## 2022-08-15 NOTE — Progress Notes (Unsigned)
NEUROLOGY FOLLOW UP OFFICE NOTE  [name] [name] [id number]  Assessment/Plan:   Focal onset seizures with impaired consciousness - drop attacks and staring spells.  Some of the drop attacks and dizziness may have been related to hyponatremia secondary to oxcarbazepine.      Continue lamotrigine ER  BID and levetiracetam  BID Follow up in 1 year.   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  She is accompanied by an interpreter.   UPDATE: Current medications: Lamotrigine ER 300 mg twice daily, levetiracetam  BID  She has been doing well.  No falls or other spells.  Still may feel a little dizzy.    CMP from earlier this month WNL.       HISTORY: Two types: 1) Staring spells, not fully respond, sometimes aware, tenses up but no convulsions, lasts seconds to a minute.    2) Drop attacks, loses conscious and falls.  Sometimes able to hold on to something.  She has had recurrence of drop attacks as well as dizzy spells for the past year.  It seems to have started following the death of her father.  The spells were well controlled up until that time.  She does endorse depression.  She has one spell a month, usually at the end of the month.     Due to recurrence of drop attacks, she had another EEG performed on 06/17/14, which was normal.  Due to dizziness, trough levels of her antiepileptic medications were checked to look for toxicity, which were normal.  Levels were normal.    In March 2022, she felt weak and had a syncopal spell, unresponsive for several minutes.  She then has had intermittent episodes of unresponsiveness.  She was brought to the ED where EEG showed mild diffuse slowing but no epileptiform discharges or seizures.  CT head personally reviewed was negative for  acute findings.  Labs were unremarkable, with Na 130, which is baseline.    Due to ongoing staring spells, a 48 year ambulatory EEG was performed in February 2023 which was normal.  She did report episode of lightheadedness with no electrographic correlate.       She also has migraines once in awhile.   Prior medications:  She had side effects to Trileptal at .  Other medications tried, include Dilantin (ineffective), and immediate-release lamotrigine (ineffective), Trileptal (hyponatremia)   Prior studies: EEG: focal slowing and frequent spike and wave activity in left anterior temporal lobe. MRI Brain (04/13/97): focal area of encephalomalacia in posterior left temporal lobe. MRI Brain (10/19/08): possible left mesial temporal sclerosis and lesion in left posterior temporal lobe.   She does not drive.   When she was [age] years old, she developed a virus following a case of the chicken pox. 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  PAST MEDICAL HISTORY: Past Medical History:  Diagnosis Date   Chicken pox    Conductive hearing loss, childhood onset    Deafness    from medication a a child for chicken pox   Epilepsy, followed by Dr. Smiley Houseman in neurology 08/26/2012   Hx of adenomatous colonic polyps 11/19/2016   Pacemaker    Pacemaker    2012   Seizure (HCC)    Seizures (HCC)  [place]     MEDICATIONS: Current Outpatient Medications on File Prior to Visit  Medication Sig Dispense Refill   cholecalciferol (VITAMIN D3) 25 MCG (1000 UNIT) tablet Take 1,000 Units by mouth 2 (two) times daily.     folic acid (FOLVITE) 1 MG tablet Take 1 mg by mouth daily.     LamoTRIgine 300 MG TB24 24 hour tablet Take 1 tablet by mouth twice daily 60 tablet 5   levETIRAcetam (KEPPRA) 500 MG tablet Take 1 tablet (500 mg total) by mouth 2 (two) times daily. 120 tablet 11   Magnesium 400 MG CAPS Take 400 mg by mouth daily.     Multiple Vitamin  (MULTIVITAMIN WITH MINERALS) TABS tablet Take 2 tablets by mouth daily.     vitamin C (ASCORBIC ACID) 500 MG tablet Take 500 mg by mouth daily.     No current facility-administered medications on file prior to visit.    ALLERGIES: No Known Allergies  FAMILY HISTORY: Family History  Problem Relation Age of Onset   Diabetes Father    Hypertension Father    Colon cancer Father 63   Hypertension Mother    Other Mother        skin lesions   Diabetes Mother        may be borderline   Healthy Brother    Healthy Brother    Esophageal cancer Neg Hx    Rectal cancer Neg Hx    Stomach cancer Neg Hx       Objective:  Blood pressure 139/69, pulse 73, height  (1.651 m), weight 172 lb (78 kg), last menstrual period 08/29/2005, SpO2 98 %. General: No acute distress.  Patient appears well-groomed.   Head:  Normocephalic/atraumatic Eyes:  Fundi examined but not visualized Neck: supple, no paraspinal tenderness, full range of motion Heart:  Regular rate and rhythm Neurological Exam: alert and oriented.  Language intact.  Deaf.  Otherwise, CN II-XII intact. Bulk and tone normal, muscle strength 5/5 throughout.  Sensation to light touch intact.  Deep tendon reflexes 2+ throughout.  Finger to nose testing intact.  Gait normal, Romberg negative.   Shon Millet, DO  CC: Theodis Shove, MD

## 2022-08-16 ENCOUNTER — Ambulatory Visit (INDEPENDENT_AMBULATORY_CARE_PROVIDER_SITE_OTHER): Payer: Medicare Other | Admitting: Neurology

## 2022-08-16 ENCOUNTER — Encounter: Payer: Self-pay | Admitting: Neurology

## 2022-08-16 DIAGNOSIS — G40009 Localization-related (focal) (partial) idiopathic epilepsy and epileptic syndromes with seizures of localized onset, not intractable, without status epilepticus: Secondary | ICD-10-CM

## 2022-08-16 MED ORDER — LAMOTRIGINE ER 300 MG PO TB24: 1.0000 | ORAL_TABLET | Freq: Two times a day (BID) | ORAL | 3 refills | Status: DC

## 2022-08-16 MED ORDER — LEVETIRACETAM 500 MG PO TABS: 500.0000 mg | ORAL_TABLET | Freq: Two times a day (BID) | ORAL | 3 refills | Status: DC

## 2022-08-16 NOTE — Patient Instructions (Addendum)
Lamotrigine  twice daily Levetiracetam  twice daily

## 2022-08-22 ENCOUNTER — Ambulatory Visit (INDEPENDENT_AMBULATORY_CARE_PROVIDER_SITE_OTHER): Payer: Medicare Other

## 2022-08-22 DIAGNOSIS — I495 Sick sinus syndrome: Secondary | ICD-10-CM

## 2022-08-23 LAB — CUP PACEART REMOTE DEVICE CHECK
Battery Impedance: 1823 Ohm
Battery Remaining Longevity: 47 mo
Battery Voltage: 2.75 V
Brady Statistic AP VP Percent: 0 %
Brady Statistic AP VS Percent: 0 %
Brady Statistic AS VP Percent: 0 %
Brady Statistic AS VS Percent: 100 %
Date Time Interrogation Session: 20240423142348
Implantable Lead Connection Status: 753985
Implantable Lead Connection Status: 753985
Implantable Lead Implant Date: 20120409
Implantable Lead Implant Date: 20120409
Implantable Lead Location: 753859
Implantable Lead Location: 753860
Implantable Lead Model: 4076
Implantable Lead Model: 4076
Implantable Pulse Generator Implant Date: 20120409
Lead Channel Impedance Value: 583 Ohm
Lead Channel Impedance Value: 650 Ohm
Lead Channel Pacing Threshold Amplitude: 0.625 V
Lead Channel Pacing Threshold Amplitude: 0.625 V
Lead Channel Pacing Threshold Pulse Width: 0.4 ms
Lead Channel Pacing Threshold Pulse Width: 0.4 ms
Lead Channel Setting Pacing Amplitude: 2 V
Lead Channel Setting Pacing Amplitude: 2.5 V
Lead Channel Setting Pacing Pulse Width: 0.4 ms
Lead Channel Setting Sensing Sensitivity: 5.6 mV
Zone Setting Status: 755011
Zone Setting Status: 755011

## 2022-09-19 NOTE — Progress Notes (Signed)
Remote pacemaker transmission.   

## 2022-11-21 ENCOUNTER — Ambulatory Visit (INDEPENDENT_AMBULATORY_CARE_PROVIDER_SITE_OTHER): Payer: Medicare Other

## 2022-11-21 DIAGNOSIS — I495 Sick sinus syndrome: Secondary | ICD-10-CM

## 2022-11-22 LAB — CUP PACEART REMOTE DEVICE CHECK
Battery Impedance: 1881 Ohm
Battery Remaining Longevity: 45 mo
Battery Voltage: 2.75 V
Brady Statistic AP VP Percent: 0 %
Brady Statistic AP VS Percent: 0 %
Brady Statistic AS VP Percent: 0 %
Brady Statistic AS VS Percent: 100 %
Date Time Interrogation Session: 20240723151759
Implantable Lead Connection Status: 753985
Implantable Lead Connection Status: 753985
Implantable Lead Implant Date: 20120409
Implantable Lead Implant Date: 20120409
Implantable Lead Location: 753859
Implantable Lead Location: 753860
Implantable Lead Model: 4076
Implantable Lead Model: 4076
Implantable Pulse Generator Implant Date: 20120409
Lead Channel Impedance Value: 574 Ohm
Lead Channel Impedance Value: 626 Ohm
Lead Channel Pacing Threshold Amplitude: 0.625 V
Lead Channel Pacing Threshold Amplitude: 0.625 V
Lead Channel Pacing Threshold Pulse Width: 0.4 ms
Lead Channel Pacing Threshold Pulse Width: 0.4 ms
Lead Channel Setting Pacing Amplitude: 2 V
Lead Channel Setting Pacing Amplitude: 2.5 V
Lead Channel Setting Pacing Pulse Width: 0.4 ms
Lead Channel Setting Sensing Sensitivity: 5.6 mV
Zone Setting Status: 755011
Zone Setting Status: 755011

## 2022-12-06 NOTE — Progress Notes (Signed)
Remote pacemaker transmission.   

## 2023-02-02 ENCOUNTER — Ambulatory Visit: Payer: Medicare Other | Admitting: Family Medicine

## 2023-02-05 ENCOUNTER — Encounter: Payer: Self-pay | Admitting: Internal Medicine

## 2023-02-05 ENCOUNTER — Ambulatory Visit: Payer: Medicare Other | Attending: Internal Medicine | Admitting: Internal Medicine

## 2023-02-05 VITALS — BP 126/82 | HR 67 | Ht 65.0 in | Wt 172.8 lb

## 2023-02-05 DIAGNOSIS — I495 Sick sinus syndrome: Secondary | ICD-10-CM

## 2023-02-05 DIAGNOSIS — Z950 Presence of cardiac pacemaker: Secondary | ICD-10-CM | POA: Diagnosis not present

## 2023-02-05 LAB — CUP PACEART INCLINIC DEVICE CHECK
Battery Impedance: 1994 Ohm
Battery Remaining Longevity: 43 mo
Battery Voltage: 2.74 V
Brady Statistic AP VP Percent: 0 %
Brady Statistic AP VS Percent: 0 %
Brady Statistic AS VP Percent: 0 %
Brady Statistic AS VS Percent: 100 %
Date Time Interrogation Session: 20241007170251
Implantable Lead Connection Status: 753985
Implantable Lead Connection Status: 753985
Implantable Lead Implant Date: 20120409
Implantable Lead Implant Date: 20120409
Implantable Lead Location: 753859
Implantable Lead Location: 753860
Implantable Lead Model: 4076
Implantable Lead Model: 4076
Implantable Pulse Generator Implant Date: 20120409
Lead Channel Impedance Value: 546 Ohm
Lead Channel Impedance Value: 604 Ohm
Lead Channel Pacing Threshold Amplitude: 0.5 V
Lead Channel Pacing Threshold Amplitude: 0.625 V
Lead Channel Pacing Threshold Amplitude: 0.625 V
Lead Channel Pacing Threshold Amplitude: 0.75 V
Lead Channel Pacing Threshold Pulse Width: 0.4 ms
Lead Channel Pacing Threshold Pulse Width: 0.4 ms
Lead Channel Pacing Threshold Pulse Width: 0.4 ms
Lead Channel Pacing Threshold Pulse Width: 0.4 ms
Lead Channel Sensing Intrinsic Amplitude: 22.4 mV
Lead Channel Sensing Intrinsic Amplitude: 4 mV
Lead Channel Setting Pacing Amplitude: 2 V
Lead Channel Setting Pacing Amplitude: 2.5 V
Lead Channel Setting Pacing Pulse Width: 0.4 ms
Lead Channel Setting Sensing Sensitivity: 5.6 mV
Zone Setting Status: 755011
Zone Setting Status: 755011

## 2023-02-05 NOTE — Progress Notes (Unsigned)
Patient Care Team: Karie Georges, MD as PCP - General (Family Medicine) Noland Fordyce, MD as Consulting Physician (Obstetrics and Gynecology) Drema Dallas, DO as Consulting Physician (Neurology) Verner Chol, [facility] (Inactive) as Pharmacist (Pharmacist)   HPI  [name] [name] is a 67 y.o. female Seen in followup for  pacemaker implanted  for asystole during tilt table testin in 2012 in [place]  She is deaf. The patient denies chest pain, shortness of breath, nocturnal dyspnea, orthopnea or peripheral edema.  There have been no palpitations, lightheadedness or syncope.   Intercurrent diagnosis of a seizure disorder.  None in 3 to 4 years.  On Keppra.  Her recollection is that the symptoms of her seizures and the symptoms of her positive tilt table were not the same  Date Cr K Hgb  4/24 0.9 4.9 12.4             DATE TEST EF   7/23 Echo   60-65 %                   Past Medical History:  Diagnosis Date   Chicken pox    Conductive hearing loss, childhood onset    Deafness    from medication as a child for chicken pox   Epilepsy (HCC)    Follows w/ Dr. Everlena Cooper. LOV as of 11/30/21 was 11/14/21 with Dr. Everlena Cooper in Epic.   Hx of adenomatous colonic polyps 11/19/2016   Metabolic encephalopathy 07/17/2020   See ED note from Dr. Trudie Buckler dated 07/17/20 in Epic. Two days after having shoulder surgery, pt had a syncopal episode and was unresponsive for several minutes. Per MD note, patient confusions was suspected to be due to hyponatremia and post-op pain meds.   Pacemaker    2012, LOV with cardiology on 11/22/21 with Francis Dowse, PA-C. Cardiac clearance given for 12/15/21 hysterectomy.   Seizures (HCC)    LOV with neurology 11/14/21 Dr. Everlena Cooper. Last seizure was 05/31/20 as of 11/30/21.   Vitamin B12 deficiency 2023   currently on supplementation as of 11/30/21   Vitamin D deficiency 2023   currently on supplementation as of 11/30/21   Wears glasses     Past Surgical History:   Procedure Laterality Date   ABDOMINAL HYSTERECTOMY Bilateral 12/15/2021   Procedure: HYSTERECTOMY ABDOMINAL with BILATERAL SALPINGO-OOPHORECTOMY;  Surgeon: Noland Fordyce, MD;  Location: [facility];  Service: Gynecology;  Laterality: Bilateral;   COLONOSCOPY  11/13/2016   [facility], 2 diminutive adenomas   PACEMAKER INSERTION  07/31/2010   ROBOTIC ASSISTED TOTAL HYSTERECTOMY WITH BILATERAL SALPINGO OOPHERECTOMY Bilateral 12/15/2021   Procedure: ATTEMPTED XI ROBOTIC ASSISTED TOTAL HYSTERECTOMY WITH BILATERAL SALPINGO OOPHORECTOMY;  Surgeon: Noland Fordyce, MD;  Location: [facility] [place];  Service: Gynecology;  Laterality: Bilateral;   TOTAL HIP ARTHROPLASTY  07/31/2011   right   TOTAL HIP ARTHROPLASTY  03/01/2012   left   TOTAL SHOULDER ARTHROPLASTY Right 07/15/2020   Procedure: TOTAL SHOULDER ARTHROPLASTY;  Surgeon: Jones Broom, MD;  Location: [facility];  Service: Orthopedics;  Laterality: Right;  NEED 120 MINUTES    Current Outpatient Medications  Medication Sig Dispense Refill   acetaminophen (TYLENOL) 325 MG tablet Take 650 mg by mouth every 6 (six) hours as needed for mild pain or moderate pain.     cholecalciferol (VITAMIN D3) 25 MCG (1000 UNIT) tablet Take 1,000 Units by mouth 2 (two) times daily.     folic acid (FOLVITE) 1 MG tablet Take 1 mg by mouth  daily.     ibuprofen (ADVIL) 600 MG tablet Take 1 tablet (600 mg total) by mouth every 6 (six) hours. 60 tablet 1   LamoTRIgine 300 MG TB24 24 hour tablet Take 1 tablet (300 mg total) by mouth 2 (two) times daily. 180 tablet 3   levETIRAcetam (KEPPRA) 500 MG tablet Take 1 tablet (500 mg total) by mouth 2 (two) times daily. 180 tablet 3   Magnesium 400 MG CAPS Take 400 mg by mouth daily.     menthol-cetylpyridinium (CEPACOL) 3 MG lozenge Take 1 lozenge (3 mg total) by mouth every 4 (four) hours as needed for sore throat. 30 tablet 1   Multiple Vitamin (MULTIVITAMIN WITH MINERALS) TABS tablet Take 2  tablets by mouth daily.     vitamin C (ASCORBIC ACID) 500 MG tablet Take 500 mg by mouth daily.     No current facility-administered medications for this visit.    No Known Allergies  Review of Systems negative except from HPI and PMH  Physical Exam BP 126/82   Pulse 67   Ht 5\' 5"  (1.651 m)   Wt 172 lb 12.8 oz (78.4 kg)   LMP 08/29/2005   SpO2 99%   BMI 28.76 kg/m  Well developed and well nourished in no acute distress HENT normal Neck supple   Clear Device pocket well healed; without hematoma or erythema.  There is no tethering  Regular rate and rhythm, no murmur Abd-soft   No Clubbing cyanosis  edema Skin-warm and dry A & Oriented  Grossly normal sensory and motor function  ECG sinus at 67 Intervals 20/08/39 Otherwise normal  Device function is normal. Programming changes none  See Paceart for details    Assessment and  Plan  Syncope  Sinus node dysfunction  Pacemaker-Medtronic    Seizures  Deaf  No interval syncope.  Reviewed device longevity

## 2023-02-05 NOTE — Patient Instructions (Signed)
Medication Instructions:  Your physician recommends that you continue on your current medications as directed. Please refer to the Current Medication list given to you today.  *If you need a refill on your cardiac medications before your next appointment, please call your pharmacy*   Lab Work: None ordered.  If you have labs (blood work) drawn today and your tests are completely normal, you will receive your results only by: MyChart Message (if you have MyChart) OR A paper copy in the mail If you have any lab test that is abnormal or we need to change your treatment, we will call you to review the results.   Testing/Procedures: None ordered.    Follow-Up: At [facility], you and your health needs are our priority.  As part of our continuing mission to provide you with exceptional heart care, we have created designated Provider Care Teams.  These Care Teams include your primary Cardiologist (physician) and Advanced Practice Providers (APPs -  Physician Assistants and Nurse Practitioners) who all work together to provide you with the care you need, when you need it.  We recommend signing up for the patient portal called "MyChart".  Sign up information is provided on this After Visit Summary.  MyChart is used to connect with patients for Virtual Visits (Telemedicine).  Patients are able to view lab/test results, encounter notes, upcoming appointments, etc.  Non-urgent messages can be sent to your provider as well.   To learn more about what you can do with MyChart, go to [email or web address].    Your next appointment:   12 months with Francis Dowse, PA-C

## 2023-03-15 ENCOUNTER — Ambulatory Visit (INDEPENDENT_AMBULATORY_CARE_PROVIDER_SITE_OTHER): Payer: Medicare Other | Admitting: Family Medicine

## 2023-03-15 ENCOUNTER — Encounter: Payer: Self-pay | Admitting: Family Medicine

## 2023-03-15 VITALS — BP 110/80 | HR 67 | Temp 97.9°F | Ht 65.0 in | Wt 177.9 lb

## 2023-03-15 DIAGNOSIS — R4589 Other symptoms and signs involving emotional state: Secondary | ICD-10-CM

## 2023-03-15 NOTE — Progress Notes (Signed)
Established Patient Office Visit  Subjective   Patient ID: [name] [name], female    DOB: 06-30-1955  Age: 67 y.o. MRN: [id number]  Chief Complaint  Patient presents with   Medical Management of Chronic Issues    Pt is here with the help of ASL interpreter. Patient reports she is feeling sad more recently. States that she isn't really sure why she is feeling this way, maybe has been going on the last few months, since about July or so. No changes in her medications. No other associated symptoms, states that she is sleeping "ok" at night, sometimes she tosses turns but overall she reports feeling rested. Still has activities that she enjoys doing.   Patient states she doesn't think they are very severe, has never been diagnosed with depression in the past. States that she does go places with her daughter but she "likes to stay home". Reports on a scale of 1-10 that her severity is only a 1. We talked about monitoring her symptoms and I advised her to let me know if her symptoms get worse.    Current Outpatient Medications  Medication Instructions   acetaminophen (TYLENOL) 650 mg, Oral, Every 6 hours PRN   ascorbic acid (VITAMIN C) 500 mg, Oral, Daily   cholecalciferol (VITAMIN D3) 1,000 Units, Oral, 2 times daily   folic acid (FOLVITE) 1 mg, Oral, Daily   ibuprofen (ADVIL) 600 mg, Oral, Every 6 hours   LamoTRIgine 300 mg, Oral, 2 times daily   levETIRAcetam (KEPPRA) 500 mg, Oral, 2 times daily   Magnesium 400 mg, Oral, Daily   menthol-cetylpyridinium (CEPACOL) 3 mg, Oral, Every 4 hours PRN   Multiple Vitamin (MULTIVITAMIN WITH MINERALS) TABS tablet 2 tablets, Oral, Daily    Patient Active Problem List   Diagnosis Date Noted   High grade squamous intraepithelial lesion (HGSIL), grade 3 CIN, on biopsy of cervix 12/15/2021   S/P hysterectomy with oophorectomy 12/15/2021   Balance disorder 04/28/2021   Hyperglycemia 04/13/2021   Hypertension 04/13/2021   Squamous cell carcinoma, trunk  07/10/2019   Hx of adenomatous colonic polyps 11/19/2016   Localization-related idiopathic epilepsy and epileptic syndromes with seizures of localized onset, not intractable, without status epilepticus (HCC) 01/01/2015   Drop attack 01/01/2015   Dizziness and giddiness 01/01/2015   Partial epilepsy with impairment of consciousness (HCC) 09/12/2013   Pacemaker-dual chamber medtronic 10/25/2012   Deaf 08/26/2012   Migraine 08/26/2012   Sinus node dysfunction (HCC) 08/26/2012   H/O bilateral hip replacements 08/26/2012      Review of Systems  All other systems reviewed and are negative.     Objective:     BP 110/80 (BP Location: Left Arm, Patient Position: Sitting, Cuff Size: Large)   Pulse 67   Temp 97.9 F (36.6 C) (Oral)   Ht 5\' 5"  (1.651 m)   Wt 177 lb 14.4 oz (80.7 kg)   LMP 08/29/2005   SpO2 100%   BMI 29.60 kg/m    Physical Exam Vitals reviewed.  Constitutional:      Appearance: Normal appearance. She is well-groomed and normal weight.  Neck:     Thyroid: No thyromegaly.  Cardiovascular:     Rate and Rhythm: Normal rate and regular rhythm.     Pulses: Normal pulses.     Heart sounds: S1 normal and S2 normal.  Pulmonary:     Effort: Pulmonary effort is normal.     Breath sounds: Normal breath sounds and air entry.  Musculoskeletal:  Right lower leg: No edema.     Left lower leg: No edema.  Neurological:     Mental Status: She is alert and oriented to person, place, and time. Mental status is at baseline.     Gait: Gait is intact.  Psychiatric:        Mood and Affect: Mood and affect normal.        Speech: Speech normal.        Behavior: Behavior normal.        Judgment: Judgment normal.     No results found for any visits on 03/15/23.    The 10-year ASCVD risk score (Arnett DK, et al., 2019) is: 4.5%    Assessment & Plan:  Symptoms of depression   Patient may be having worsening of her depression, we had a long talk about what symptoms to  look out for and I counseled her on stress management. Pt was encouraged to reach out in the future if she feels that her symptoms are getting worse and we can discuss medications. I spent 20 minutes with the patient today discussing these issues and providing counseling.   Return in about 6 months (around 09/12/2023) for annual physical exam.    Karie Georges, MD

## 2023-08-13 ENCOUNTER — Ambulatory Visit (INDEPENDENT_AMBULATORY_CARE_PROVIDER_SITE_OTHER): Payer: Medicare Other

## 2023-08-13 VITALS — BP 120/60 | HR 66 | Temp 98.2°F | Ht 65.0 in | Wt 172.5 lb

## 2023-08-13 DIAGNOSIS — Z1211 Encounter for screening for malignant neoplasm of colon: Secondary | ICD-10-CM | POA: Diagnosis not present

## 2023-08-13 DIAGNOSIS — Z13820 Encounter for screening for osteoporosis: Secondary | ICD-10-CM | POA: Diagnosis not present

## 2023-08-13 DIAGNOSIS — Z0000 Encounter for general adult medical examination without abnormal findings: Secondary | ICD-10-CM

## 2023-08-13 NOTE — Progress Notes (Signed)
 Subjective:   [name] [name] is a 68 y.o. who presents for a Medicare Wellness preventive visit.  Visit Complete: In person    Persons Participating in Visit: Patient.  AWV Questionnaire: No: Patient Medicare AWV questionnaire was not completed prior to this visit.  Cardiac Risk Factors include: advanced age (>94men, >74 women);hypertension     Objective:    Today's Vitals   08/13/23 1431  BP: 120/60  Pulse: 66  Temp: 98.2 F (36.8 C)  TempSrc: Oral  SpO2: 98%  Weight: 172 lb 8 oz (78.2 kg)  Height: 5\' 5"  (1.651 m)   Body mass index is 28.71 kg/m.     08/13/2023    3:05 PM 08/16/2022    2:25 PM 12/15/2021   10:00 PM 12/15/2021   12:18 PM 11/29/2021    2:42 PM 04/28/2021    8:53 PM 11/16/2020    2:43 PM  Advanced Directives  Does Patient Have a Medical Advance Directive? Yes No Yes Yes No No No  Type of Estate agent of [place];Living will  Living will Living will     Does patient want to make changes to medical advance directive?   No - Patient declined      Copy of Healthcare Power of Attorney in Chart? No - copy requested        Would patient like information on creating a medical advance directive?     Yes (MAU/Ambulatory/Procedural Areas - Information given) No - Patient declined Yes (MAU/Ambulatory/Procedural Areas - Information given)    Current Medications (verified) Outpatient Encounter Medications as of 08/13/2023  Medication Sig   acetaminophen (TYLENOL) 325 MG tablet Take 650 mg by mouth every 6 (six) hours as needed for mild pain or moderate pain.   cholecalciferol (VITAMIN D3) 25 MCG (1000 UNIT) tablet Take 1,000 Units by mouth 2 (two) times daily.   folic acid (FOLVITE) 1 MG tablet Take 1 mg by mouth daily.   ibuprofen (ADVIL) 600 MG tablet Take 1 tablet (600 mg total) by mouth every 6 (six) hours.   LamoTRIgine 300 MG TB24 24 hour tablet Take 1 tablet (300 mg total) by mouth 2 (two) times daily.   levETIRAcetam (KEPPRA) 500 MG tablet  Take 1 tablet (500 mg total) by mouth 2 (two) times daily.   Magnesium 400 MG CAPS Take 400 mg by mouth daily.   menthol-cetylpyridinium (CEPACOL) 3 MG lozenge Take 1 lozenge (3 mg total) by mouth every 4 (four) hours as needed for sore throat.   Multiple Vitamin (MULTIVITAMIN WITH MINERALS) TABS tablet Take 2 tablets by mouth daily.   vitamin C (ASCORBIC ACID) 500 MG tablet Take 500 mg by mouth daily.   No facility-administered encounter medications on file as of 08/13/2023.    Allergies (verified) Patient has no known allergies.   History: Past Medical History:  Diagnosis Date   Chicken pox    Conductive hearing loss, childhood onset    Deafness    from medication as a child for chicken pox   Epilepsy (HCC)    Follows w/ Dr. Everlena Cooper. LOV as of 11/30/21 was 11/14/21 with Dr. Everlena Cooper in Epic.   Hx of adenomatous colonic polyps 11/19/2016   Metabolic encephalopathy 07/17/2020   See ED note from Dr. Trudie Buckler dated 07/17/20 in Epic. Two days after having shoulder surgery, pt had a syncopal episode and was unresponsive for several minutes. Per MD note, patient confusions was suspected to be due to hyponatremia and post-op pain meds.   Pacemaker  2012, LOV with cardiology on 11/22/21 with Mertha Abrahams, PA-C. Cardiac clearance given for 12/15/21 hysterectomy.   Seizures (HCC)    LOV with neurology 11/14/21 Dr. Festus Hubert. Last seizure was 05/31/20 as of 11/30/21.   Vitamin B12 deficiency 2023   currently on supplementation as of 11/30/21   Vitamin D deficiency 2023   currently on supplementation as of 11/30/21   Wears glasses    Past Surgical History:  Procedure Laterality Date   ABDOMINAL HYSTERECTOMY Bilateral 12/15/2021   Procedure: HYSTERECTOMY ABDOMINAL with BILATERAL SALPINGO-OOPHORECTOMY;  Surgeon: Audelia Leaks, MD;  Location: [facility];  Service: Gynecology;  Laterality: Bilateral;   COLONOSCOPY  11/13/2016    Endoscopy, 2 diminutive adenomas   PACEMAKER INSERTION   07/31/2010   ROBOTIC ASSISTED TOTAL HYSTERECTOMY WITH BILATERAL SALPINGO OOPHERECTOMY Bilateral 12/15/2021   Procedure: ATTEMPTED XI ROBOTIC ASSISTED TOTAL HYSTERECTOMY WITH BILATERAL SALPINGO OOPHORECTOMY;  Surgeon: Audelia Leaks, MD;  Location: [facility] [place];  Service: Gynecology;  Laterality: Bilateral;   TOTAL HIP ARTHROPLASTY  07/31/2011   right   TOTAL HIP ARTHROPLASTY  03/01/2012   left   TOTAL SHOULDER ARTHROPLASTY Right 07/15/2020   Procedure: TOTAL SHOULDER ARTHROPLASTY;  Surgeon: Sammye Cristal, MD;  Location: [facility];  Service: Orthopedics;  Laterality: Right;  NEED 120 MINUTES   Family History  Problem Relation Age of Onset   Diabetes Father    Hypertension Father    Colon cancer Father 49   Hypertension Mother    Other Mother        skin lesions   Diabetes Mother        may be borderline   Healthy Brother    Healthy Brother    Esophageal cancer Neg Hx    Rectal cancer Neg Hx    Stomach cancer Neg Hx    Social History   Socioeconomic History   Marital status: Divorced    Spouse name: Not on file   Number of children: 2   Years of education: Not on file   Highest education level: High school graduate  Occupational History   Not on file  Tobacco Use   Smoking status: Never   Smokeless tobacco: Never  Vaping Use   Vaping status: Never Used  Substance and Sexual Activity   Alcohol use: No    Alcohol/week: 0.0 standard drinks of alcohol    Comment: none   Drug use: No   Sexual activity: Not on file  Other Topics Concern   Not on file  Social History Narrative   ** Merged History Encounter **       Work or School: on disability for the deafness, seizure and carpal tunnel      Home Situation: lives with mother and father currently - since 06/2012      Spiritual Beliefs: Christian      Lifestyle: walks twice daily      Right handed      Social Drivers of Health   Financial Resource Strain: Low Risk  (08/13/2023)   Overall Financial  Resource Strain (CARDIA)    Difficulty of Paying Living Expenses: Not hard at all  Food Insecurity: No Food Insecurity (08/13/2023)   Hunger Vital Sign    Worried About Running Out of Food in the Last Year: Never true    Ran Out of Food in the Last Year: Never true  Transportation Needs: No Transportation Needs (08/13/2023)   PRAPARE - Transportation    Lack of Transportation (Medical): No    Lack  of Transportation (Non-Medical): No  Physical Activity: Inactive (08/13/2023)   Exercise Vital Sign    Days of Exercise per Week: 0 days    Minutes of Exercise per Session: 0 min  Stress: No Stress Concern Present (08/13/2023)   [employer] of Occupational Health - Occupational Stress Questionnaire    Feeling of Stress : Not at all  Social Connections: Moderately Integrated (08/13/2023)   Social Connection and Isolation Panel [NHANES]    Frequency of Communication with Friends and Family: More than three times a week    Frequency of Social Gatherings with Friends and Family: More than three times a week    Attends Religious Services: More than 4 times per year    Active Member of [employer] or Organizations: Yes    Attends Engineer, structural: More than 4 times per year    Marital Status: Divorced    Tobacco Counseling Counseling given: Not Answered    Clinical Intake:  Pre-visit preparation completed: Yes  Pain : No/denies pain     BMI - recorded: 28.71 Nutritional Status: BMI 25 -29 Overweight Nutritional Risks: None Diabetes: No  Lab Results  Component Value Date   HGBA1C 5.5 12/06/2020   HGBA1C 5.0 10/08/2020   HGBA1C 5.1 01/09/2020     How often do you need to have someone help you when you read instructions, pamphlets, or other written materials from your doctor or pharmacy?: 3 - Sometimes (Mother assist)  Interpreter Needed?: Yes Interpreter Agency: [facility] Interpreter Name: Garner Nash Interpreter ID: [email or web address]/[id number] Patient Declined Interpreter : No Patient  signed [place] waiver: No  Information entered by :: Theresa Mulligan LPN   Activities of Daily Living     08/13/2023    3:03 PM  In your present state of health, do you have any difficulty performing the following activities:  Hearing? 1  Comment Legally Deaf  Vision? 0  Difficulty concentrating or making decisions? 0  Walking or climbing stairs? 0  Dressing or bathing? 0  Doing errands, shopping? 0  Preparing Food and eating ? N  Using the Toilet? N  In the past six months, have you accidently leaked urine? N  Do you have problems with loss of bowel control? N  Managing your Medications? N  Managing your Finances? N  Housekeeping or managing your Housekeeping? N    Patient Care Team: Karie Georges, MD as PCP - General (Family Medicine) Noland Fordyce, MD as Consulting Physician (Obstetrics and Gynecology) Drema Dallas, DO as Consulting Physician (Neurology) Verner Chol, [facility] (Inactive) as Pharmacist (Pharmacist)  Indicate any recent Medical Services you may have received from other than [facility] providers in the past year (date may be approximate).     Assessment:   This is a routine wellness examination for Tym[place].  Hearing/Vision screen Hearing Screening - Comments:: Legally Deaf Vision Screening - Comments:: Wears rx glasses - up to date with routine eye exams with  Deferred    Goals Addressed               This Visit's Progress     Increase physical activity (pt-stated)        Walk more.       Depression Screen      08/13/2023    2:33 PM 08/04/2022    2:48 PM 11/29/2021    2:47 PM 04/13/2021    2:44 PM 11/16/2020    2:41 PM 01/09/2020    9:01 AM 01/08/2019   10:02 AM  PHQ 2/9 Scores  PHQ - 2 Score 0 1 0 1 1 0 0  PHQ- 9 Score    2       Fall Risk     08/13/2023    3:04 PM 08/16/2022    2:25 PM 08/04/2022    2:48 PM 11/29/2021    2:46 PM 11/14/2021    1:40 PM  Fall Risk   Falls in the past year? 0 0 0 1 0  Comment    passed out   Number  falls in past yr: 0 0 0 1 0  Injury with Fall? 0 0 0 0 0  Risk for fall due to : No Fall Risks  No Fall Risks Medication side effect   Follow up Falls prevention discussed;Falls evaluation completed Falls evaluation completed Falls evaluation completed Falls evaluation completed;Education provided;Falls prevention discussed     MEDICARE RISK AT HOME:  Medicare Risk at Home Any stairs in or around the home?: Yes If so, are there any without handrails?: No Home free of loose throw rugs in walkways, pet beds, electrical cords, etc?: Yes Adequate lighting in your home to reduce risk of falls?: Yes Life alert?: No Use of a cane, walker or w/c?: No Grab bars in the bathroom?: Yes Shower chair or bench in shower?: No Elevated toilet seat or a handicapped toilet?: No  TIMED UP AND GO:  Was the test performed?  Yes  Length of time to ambulate 10 feet: 10 sec Gait steady and fast without use of assistive device  Cognitive Function: 6CIT completed        08/13/2023    3:06 PM 11/16/2020    2:49 PM  6CIT Screen  What Year? 0 points 0 points  What month? 0 points 0 points  What time? 0 points 0 points  Count back from 20 0 points 0 points  Months in reverse 0 points 0 points  Repeat phrase 0 points 0 points  Total Score 0 points 0 points    Immunizations Immunization History  Administered Date(s) Administered   Influenza Inj Mdck Quad Pf 01/31/2018   Influenza Split 01/29/2013, 01/20/2014   Influenza,inj,Quad PF,6+ Mos 02/04/2015, 01/19/2016, 01/15/2017, 01/02/2019, 01/09/2020   Influenza-Unspecified 01/29/2018, 12/30/2020, 03/01/2023   PFIZER Comirnaty(Gray Top)Covid-19 Tri-Sucrose Vaccine 07/12/2019, 08/05/2019, 02/10/2020   PNEUMOCOCCAL CONJUGATE-20 04/13/2021   Td 09/30/2011   Zoster Recombinant(Shingrix) 04/02/2019, 01/09/2020    Screening Tests Health Maintenance  Topic Date Due   DEXA SCAN  Never done   DTaP/Tdap/Td (2 - Tdap) 09/29/2021   Colonoscopy  11/13/2021    COVID-19 Vaccine (4 - 2024-25 season) 12/31/2022   INFLUENZA VACCINE  11/30/2023   MAMMOGRAM  02/08/2024   Medicare Annual Wellness (AWV)  08/12/2024   Pneumonia Vaccine 3+ Years old  Completed   Hepatitis C Screening  Completed   Zoster Vaccines- Shingrix  Completed   HPV VACCINES  Aged Out   Meningococcal B Vaccine  Aged Out    Health Maintenance  Health Maintenance Due  Topic Date Due   DEXA SCAN  Never done   DTaP/Tdap/Td (2 - Tdap) 09/29/2021   Colonoscopy  11/13/2021   COVID-19 Vaccine (4 - 2024-25 season) 12/31/2022   Health Maintenance Items Addressed: DEXA ordered, Referral sent to GI for colonoscopy  Additional Screening:  Vision Screening: Recommended annual ophthalmology exams for early detection of glaucoma and other disorders of the eye.  Dental Screening: Recommended annual dental exams for proper oral hygiene  Community Resource Referral / Chronic Care Management:  CRR required this visit?  No   CCM required this visit?  No     Plan:     I have personally reviewed and noted the following in the patient's chart:   Medical and social history Use of alcohol, tobacco or illicit drugs  Current medications and supplements including opioid prescriptions. Patient is not currently taking opioid prescriptions. Functional ability and status Nutritional status Physical activity Advanced directives List of other physicians Hospitalizations, surgeries, and ER visits in previous 12 months Vitals Screenings to include cognitive, depression, and falls Referrals and appointments  In addition, I have reviewed and discussed with patient certain preventive protocols, quality metrics, and best practice recommendations. A written personalized care plan for preventive services as well as general preventive health recommendations were provided to patient.     Dewayne Ford, LPN   1/61/0960   After Visit Summary: (In Person-Printed) AVS printed and given to the  patient  Notes: Nothing significant to report at this time.

## 2023-08-13 NOTE — Patient Instructions (Addendum)
 Ms. [name] [name] , Thank you for taking time to come for your Medicare Wellness Visit. I appreciate your ongoing commitment to your health goals. Please review the following plan we discussed and let me know if I can assist you in the future.   Referrals/Orders/Follow-Ups/Clinician Recommendations: Increase physical activity. Follow up with eye doctor.  This is a list of the screening recommended for you and due dates:  Health Maintenance  Topic Date Due   DEXA scan (bone density measurement)  Never done   DTaP/Tdap/Td vaccine (2 - Tdap) 09/29/2021   Colon Cancer Screening  11/13/2021   COVID-19 Vaccine (4 - 2024-25 season) 12/31/2022   Flu Shot  11/30/2023   Mammogram  02/08/2024   Medicare Annual Wellness Visit  08/12/2024   Pneumonia Vaccine  Completed   Hepatitis C Screening  Completed   Zoster (Shingles) Vaccine  Completed   HPV Vaccine  Aged Out   Meningitis B Vaccine  Aged Out    Advanced directives: (Copy Requested) Please bring a copy of your health care power of attorney and living will to the office to be added to your chart at your convenience. You can mail to [facility] [address] [place], [place] [address] or email to [email or web address]  Next Medicare Annual Wellness Visit scheduled for next year: Yes

## 2023-08-15 ENCOUNTER — Ambulatory Visit: Payer: Medicare Other | Admitting: Neurology

## 2023-08-15 ENCOUNTER — Ambulatory Visit: Admitting: Family Medicine

## 2023-08-20 ENCOUNTER — Other Ambulatory Visit: Payer: Self-pay | Admitting: Family Medicine

## 2023-08-20 DIAGNOSIS — Z1231 Encounter for screening mammogram for malignant neoplasm of breast: Secondary | ICD-10-CM

## 2023-08-29 ENCOUNTER — Ambulatory Visit
Admission: RE | Admit: 2023-08-29 | Discharge: 2023-08-29 | Disposition: A | Discharge status: Home / Self Care | Source: Ambulatory Visit | Attending: Family Medicine | Admitting: Family Medicine

## 2023-08-29 DIAGNOSIS — Z1231 Encounter for screening mammogram for malignant neoplasm of breast: Secondary | ICD-10-CM

## 2023-08-30 ENCOUNTER — Encounter: Payer: Self-pay | Admitting: Internal Medicine

## 2023-09-01 ENCOUNTER — Other Ambulatory Visit: Payer: Self-pay | Admitting: Neurology

## 2023-09-01 DIAGNOSIS — G40009 Localization-related (focal) (partial) idiopathic epilepsy and epileptic syndromes with seizures of localized onset, not intractable, without status epilepticus: Secondary | ICD-10-CM

## 2023-09-03 ENCOUNTER — Encounter: Payer: Self-pay | Admitting: *Deleted

## 2023-09-11 DIAGNOSIS — H524 Presbyopia: Secondary | ICD-10-CM | POA: Diagnosis not present

## 2023-09-11 DIAGNOSIS — H52223 Regular astigmatism, bilateral: Secondary | ICD-10-CM | POA: Diagnosis not present

## 2023-09-11 DIAGNOSIS — H31003 Unspecified chorioretinal scars, bilateral: Secondary | ICD-10-CM | POA: Diagnosis not present

## 2023-09-11 DIAGNOSIS — H5213 Myopia, bilateral: Secondary | ICD-10-CM | POA: Diagnosis not present

## 2023-09-12 ENCOUNTER — Encounter: Payer: Self-pay | Admitting: Family Medicine

## 2023-09-12 ENCOUNTER — Ambulatory Visit (INDEPENDENT_AMBULATORY_CARE_PROVIDER_SITE_OTHER): Payer: Medicare Other | Admitting: Family Medicine

## 2023-09-12 VITALS — BP 136/80 | HR 64 | Temp 97.7°F | Ht 62.0 in | Wt 171.4 lb

## 2023-09-12 DIAGNOSIS — R531 Weakness: Secondary | ICD-10-CM | POA: Diagnosis not present

## 2023-09-12 DIAGNOSIS — I10 Essential (primary) hypertension: Secondary | ICD-10-CM | POA: Diagnosis not present

## 2023-09-12 DIAGNOSIS — Z13220 Encounter for screening for lipoid disorders: Secondary | ICD-10-CM | POA: Diagnosis not present

## 2023-09-12 DIAGNOSIS — R739 Hyperglycemia, unspecified: Secondary | ICD-10-CM

## 2023-09-12 DIAGNOSIS — Z0000 Encounter for general adult medical examination without abnormal findings: Secondary | ICD-10-CM | POA: Diagnosis not present

## 2023-09-12 LAB — LIPID PANEL
Cholesterol: 204 mg/dL — ABNORMAL HIGH (ref 0–200)
HDL: 60.4 mg/dL (ref >39.00)
LDL Cholesterol: 132 mg/dL — ABNORMAL HIGH (ref 0–99)
NonHDL: 144
Total CHOL/HDL Ratio: 3
Triglycerides: 62 mg/dL (ref 0.0–149.0)
VLDL: 12.4 mg/dL (ref 0.0–40.0)

## 2023-09-12 LAB — CBC WITH DIFFERENTIAL/PLATELET
Basophils Absolute: 0 10*3/uL (ref 0.0–0.1)
Basophils Relative: 0.8 % (ref 0.0–3.0)
Eosinophils Absolute: 0.1 10*3/uL (ref 0.0–0.7)
Eosinophils Relative: 1.3 % (ref 0.0–5.0)
HCT: 39.9 % (ref 36.0–46.0)
Hemoglobin: 13.1 g/dL (ref 12.0–15.0)
Lymphocytes Relative: 46.7 % — ABNORMAL HIGH (ref 12.0–46.0)
Lymphs Abs: 2.1 10*3/uL (ref 0.7–4.0)
MCHC: 32.8 g/dL (ref 30.0–36.0)
MCV: 92.8 fl (ref 78.0–100.0)
Monocytes Absolute: 0.4 10*3/uL (ref 0.1–1.0)
Monocytes Relative: 8.8 % (ref 3.0–12.0)
Neutro Abs: 1.9 10*3/uL (ref 1.4–7.7)
Neutrophils Relative %: 42.4 % — ABNORMAL LOW (ref 43.0–77.0)
Platelets: 227 10*3/uL (ref 150.0–400.0)
RBC: 4.3 Mil/uL (ref 3.87–5.11)
RDW: 15.4 % (ref 11.5–15.5)
WBC: 4.5 10*3/uL (ref 4.0–10.5)

## 2023-09-12 LAB — COMPREHENSIVE METABOLIC PANEL WITH GFR
ALT: 47 U/L — ABNORMAL HIGH (ref 0–35)
AST: 40 U/L — ABNORMAL HIGH (ref 0–37)
Albumin: 4.4 g/dL (ref 3.5–5.2)
Alkaline Phosphatase: 109 U/L (ref 39–117)
BUN: 11 mg/dL (ref 6–23)
CO2: 30 meq/L (ref 19–32)
Calcium: 10 mg/dL (ref 8.4–10.5)
Chloride: 103 meq/L (ref 96–112)
Creatinine, Ser: 0.97 mg/dL (ref 0.40–1.20)
GFR: 60.5 mL/min (ref >60.00)
Glucose, Bld: 89 mg/dL (ref 70–99)
Potassium: 5 meq/L (ref 3.5–5.1)
Sodium: 139 meq/L (ref 135–145)
Total Bilirubin: 0.5 mg/dL (ref 0.2–1.2)
Total Protein: 7.6 g/dL (ref 6.0–8.3)

## 2023-09-12 LAB — HEMOGLOBIN A1C: Hgb A1c MFr Bld: 5.4 % (ref 4.6–6.5)

## 2023-09-12 NOTE — Progress Notes (Signed)
 Complete physical exam  Patient: [name] [name]   DOB: 06/23/1955   68 y.o. Female  MRN: [id number]  Subjective:     Chief Complaint  Patient presents with   Annual Exam    [name] [name] is a 68 y.o. female who presents today for a complete physical exam. She reports consuming a general diet. The patient does not participate in regular exercise at present. She generally feels well, although she does comment that she feels weak sometimes when she skips meals or does not eat. She reports sleeping well. She does not have additional problems to discuss today.   Pt has colonoscopy scheduled for the 26th.  Most recent fall risk assessment:    08/13/2023    3:04 PM  Fall Risk   Falls in the past year? 0  Number falls in past yr: 0  Injury with Fall? 0  Risk for fall due to : No Fall Risks  Follow up Falls prevention discussed;Falls evaluation completed     Most recent depression screenings:    08/13/2023    2:33 PM 08/04/2022    2:48 PM  PHQ 2/9 Scores  PHQ - 2 Score 0 1    Vision:Within last year and Dental: No current dental problems and Receives regular dental care  Patient Active Problem List   Diagnosis Date Noted   High grade squamous intraepithelial lesion (HGSIL), grade 3 CIN, on biopsy of cervix 12/15/2021   S/P hysterectomy with oophorectomy 12/15/2021   Balance disorder 04/28/2021   Hyperglycemia 04/13/2021   Hypertension 04/13/2021   Squamous cell carcinoma, 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   Hx of adenomatous colonic polyps 11/19/2016   Localization-related idiopathic epilepsy and epileptic syndromes with seizures of localized onset, not intractable, without status epilepticus (HCC) 01/01/2015   Drop attack 01/01/2015   Dizziness and giddiness 01/01/2015   Partial epilepsy with impairment of consciousness (HCC) 09/12/2013   Pacemaker-dual chamber medtronic 10/25/2012   Deaf 08/26/2012   Migraine 08/26/2012   Sinus node dysfunction (HCC) 08/26/2012   H/O bilateral hip replacements  08/26/2012      Patient Care Team: Aida House, MD as PCP - General (Family Medicine) Audelia Leaks, MD as Consulting Physician (Obstetrics and Gynecology) Merriam Abbey, DO as Consulting Physician (Neurology) Alver Austin, [facility] (Inactive) as Pharmacist (Pharmacist)   Outpatient Medications Prior to Visit  Medication Sig   acetaminophen  (TYLENOL ) 325 MG tablet Take 650 mg by mouth every 6 (six) hours as needed for mild pain or moderate pain.   cholecalciferol (VITAMIN D3) 25 MCG (1000 UNIT) tablet Take 1,000 Units by mouth 2 (two) times daily.   folic acid  (FOLVITE ) 1 MG tablet Take 1 mg by mouth daily.   ibuprofen  (ADVIL ) 600 MG tablet Take 1 tablet (600 mg total) by mouth every 6 (six) hours.   LamoTRIgine  300 MG TB24 24 hour tablet Take 1 tablet by mouth twice daily   levETIRAcetam  (KEPPRA ) 500 MG tablet Take 1 tablet (500 mg total) by mouth 2 (two) times daily.   Magnesium 400 MG CAPS Take 400 mg by mouth daily.   menthol -cetylpyridinium (CEPACOL) 3 MG lozenge Take 1 lozenge (3 mg total) by mouth every 4 (four) hours as needed for sore throat.   Multiple Vitamin (MULTIVITAMIN WITH MINERALS) TABS tablet Take 2 tablets by mouth daily.   vitamin C (ASCORBIC ACID) 500 MG tablet Take 500 mg by mouth daily.   No facility-administered medications prior to visit.    Review of Systems  HENT:  Negative for  hearing loss.   Eyes:  Negative for blurred vision.  Respiratory:  Negative for shortness of breath.   Cardiovascular:  Negative for chest pain.  Gastrointestinal: Negative.   Genitourinary: Negative.   Musculoskeletal:  Negative for back pain.  Neurological:  Negative for headaches.  Psychiatric/Behavioral:  Negative for depression.        Objective:     BP 136/80   Pulse 64   Temp 97.7 F (36.5 C) (Oral)   Ht 5\' 2"  (1.575 m)   Wt 171 lb 6.4 oz (77.7 kg)   LMP 08/29/2005   SpO2 96%   BMI 31.35 kg/m    Physical Exam Vitals reviewed.  Constitutional:       Appearance: Normal appearance. She is well-groomed. She is obese.  HENT:     Right Ear: Tympanic membrane and ear canal normal.     Left Ear: Tympanic membrane and ear canal normal.     Mouth/Throat:     Mouth: Mucous membranes are moist.     Pharynx: No posterior oropharyngeal erythema.  Eyes:     Conjunctiva/sclera: Conjunctivae normal.  Neck:     Thyroid : No thyromegaly.  Cardiovascular:     Rate and Rhythm: Normal rate and regular rhythm.     Pulses: Normal pulses.     Heart sounds: S1 normal and S2 normal.  Pulmonary:     Effort: Pulmonary effort is normal.     Breath sounds: Normal breath sounds and air entry.  Abdominal:     General: Abdomen is flat. Bowel sounds are normal.     Palpations: Abdomen is soft.  Musculoskeletal:     Right lower leg: No edema.     Left lower leg: No edema.  Lymphadenopathy:     Cervical: No cervical adenopathy.  Neurological:     Mental Status: She is alert and oriented to person, place, and time. Mental status is at baseline.     Gait: Gait is intact.  Psychiatric:        Mood and Affect: Mood and affect normal.        Speech: Speech normal.        Behavior: Behavior normal.        Judgment: Judgment normal.      No results found for any visits on 09/12/23.     Assessment & Plan:    Routine Health Maintenance and Physical Exam  Immunization History  Administered Date(s) Administered   Influenza Inj Mdck Quad Pf 01/31/2018   Influenza Split 01/29/2013, 01/20/2014   Influenza,inj,Quad PF,6+ Mos 02/04/2015, 01/19/2016, 01/15/2017, 01/02/2019, 01/09/2020   Influenza-Unspecified 01/29/2018, 12/30/2020, 03/01/2023   PFIZER Comirnaty(Gray Top)Covid-19 Tri-Sucrose Vaccine 07/12/2019, 08/05/2019, 02/10/2020   PNEUMOCOCCAL CONJUGATE-20 04/13/2021   Td 09/30/2011   Zoster Recombinant(Shingrix ) 04/02/2019, 01/09/2020    Health Maintenance  Topic Date Due   DEXA SCAN  Never done   DTaP/Tdap/Td (2 - Tdap) 09/29/2021   Colonoscopy   11/13/2021   COVID-19 Vaccine (4 - 2024-25 season) 12/31/2022   INFLUENZA VACCINE  11/30/2023   Medicare Annual Wellness (AWV)  08/12/2024   MAMMOGRAM  08/28/2025   Pneumonia Vaccine 34+ Years old  Completed   Hepatitis C Screening  Completed   Zoster Vaccines- Shingrix   Completed   HPV VACCINES  Aged Out   Meningococcal B Vaccine  Aged Out    Discussed health benefits of physical activity, and encouraged her to engage in regular exercise appropriate for her age and condition.  Lipid screening -  Lipid panel; Future  Weakness generalized -     CBC with Differential/Platelet; Future  Primary hypertension -     Comprehensive metabolic panel with GFR; Future  Hyperglycemia -     Hemoglobin A1c; Future  Routine general medical examination at a health care facility  Normal physical exam findings except for some ear wax in the right EAC. I counseled the patient on ear wax management, I also counseled the patient on increasing exercise to at least 30 minutes 5 days per week, counseled patient on healthy eating and handouts were given. HM reviewed, she had her DEXA done at her GYN's office, will ask for records. Has colonoscopy already scheduled. She is due for tetanus booster and she was reminded to have this updated as well. Labs ordered for annual surveillance of general organ health.   Return in about 1 year (around 09/11/2024) for annual physical exam.     Aida House, MD

## 2023-09-12 NOTE — Patient Instructions (Addendum)
 You are due for a tetanus booster-- you may go to any pharmacy in the area to have this done.   Try Debrox ear drops, please 2-3 drops in the right ear about 2-3 times per week to help with ear wax  Health Maintenance, Female Adopting a healthy lifestyle and getting preventive care are important in promoting health and wellness. Ask your health care provider about: The right schedule for you to have regular tests and exams. Things you can do on your own to prevent diseases and keep yourself healthy. What should I know about diet, weight, and exercise? Eat a healthy diet  Eat a diet that includes plenty of vegetables, fruits, low-fat dairy products, and lean protein. Do not eat a lot of foods that are high in solid fats, added sugars, or sodium. Maintain a healthy weight Body mass index (BMI) is used to identify weight problems. It estimates body fat based on height and weight. Your health care provider can help determine your BMI and help you achieve or maintain a healthy weight. Get regular exercise Get regular exercise. This is one of the most important things you can do for your health. Most adults should: Exercise for at least 150 minutes each week. The exercise should increase your heart rate and make you sweat (moderate-intensity exercise). Do strengthening exercises at least twice a week. This is in addition to the moderate-intensity exercise. Spend less time sitting. Even light physical activity can be beneficial. Watch cholesterol and blood lipids Have your blood tested for lipids and cholesterol at 68 years of age, then have this test every 5 years. Have your cholesterol levels checked more often if: Your lipid or cholesterol levels are high. You are older than 68 years of age. You are at high risk for heart disease. What should I know about cancer screening? Depending on your health history and family history, you may need to have cancer screening at various ages. This may  include screening for: Breast cancer. Cervical cancer. Colorectal cancer. Skin cancer. Lung cancer. What should I know about heart disease, diabetes, and high blood pressure? Blood pressure and heart disease High blood pressure causes heart disease and increases the risk of stroke. This is more likely to develop in people who have high blood pressure readings or are overweight. Have your blood pressure checked: Every 3-5 years if you are 87-42 years of age. Every year if you are 24 years old or older. Diabetes Have regular diabetes screenings. This checks your fasting blood sugar level. Have the screening done: Once every three years after age 74 if you are at a normal weight and have a low risk for diabetes. More often and at a younger age if you are overweight or have a high risk for diabetes. What should I know about preventing infection? Hepatitis B If you have a higher risk for hepatitis B, you should be screened for this virus. Talk with your health care provider to find out if you are at risk for hepatitis B infection. Hepatitis C Testing is recommended for: Everyone born from [birth year] through 1965. Anyone with known risk factors for hepatitis C. Sexually transmitted infections (STIs) Get screened for STIs, including gonorrhea and chlamydia, if: You are sexually active and are younger than 68 years of age. You are older than 68 years of age and your health care provider tells you that you are at risk for this type of infection. Your sexual activity has changed since you were last screened, and you are  at increased risk for chlamydia or gonorrhea. Ask your health care provider if you are at risk. Ask your health care provider about whether you are at high risk for HIV. Your health care provider may recommend a prescription medicine to help prevent HIV infection. If you choose to take medicine to prevent HIV, you should first get tested for HIV. You should then be tested every 3 months  for as long as you are taking the medicine. Pregnancy If you are about to stop having your period (premenopausal) and you may become pregnant, seek counseling before you get pregnant. Take 400 to 800 micrograms (mcg) of folic acid  every day if you become pregnant. Ask for birth control (contraception) if you want to prevent pregnancy. Osteoporosis and menopause Osteoporosis is a disease in which the bones lose minerals and strength with aging. This can result in bone fractures. If you are 12 years old or older, or if you are at risk for osteoporosis and fractures, ask your health care provider if you should: Be screened for bone loss. Take a calcium or vitamin D supplement to lower your risk of fractures. Be given hormone replacement therapy (HRT) to treat symptoms of menopause. Follow these instructions at home: Alcohol use Do not drink alcohol if: Your health care provider tells you not to drink. You are pregnant, may be pregnant, or are planning to become pregnant. If you drink alcohol: Limit how much you have to: 0-1 drink a day. Know how much alcohol is in your drink. In the [place] one drink equals one 12 oz bottle of beer (355 mL), one 5 oz glass of wine (148 mL), or one 1 oz glass of hard liquor (44 mL). Lifestyle Do not use any products that contain nicotine or tobacco. These products include cigarettes, chewing tobacco, and vaping devices, such as e-cigarettes. If you need help quitting, ask your health care provider. Do not use street drugs. Do not share needles. Ask your health care provider for help if you need support or information about quitting drugs. General instructions Schedule regular health, dental, and eye exams. Stay current with your vaccines. Tell your health care provider if: You often feel depressed. You have ever been abused or do not feel safe at home. Summary Adopting a healthy lifestyle and getting preventive care are important in promoting health and  wellness. Follow your health care provider's instructions about healthy diet, exercising, and getting tested or screened for diseases. Follow your health care provider's instructions on monitoring your cholesterol and blood pressure. This information is not intended to replace advice given to you by your health care provider. Make sure you discuss any questions you have with your health care provider. Document Revised: 09/06/2020 Document Reviewed: 09/06/2020 Elsevier Patient Education  2024 [employer].

## 2023-09-18 ENCOUNTER — Ambulatory Visit: Payer: Self-pay | Admitting: Family Medicine

## 2023-09-18 DIAGNOSIS — R7989 Other specified abnormal findings of blood chemistry: Secondary | ICD-10-CM

## 2023-09-19 ENCOUNTER — Telehealth: Payer: Self-pay | Admitting: *Deleted

## 2023-09-19 NOTE — Telephone Encounter (Signed)
 See results note.

## 2023-09-19 NOTE — Telephone Encounter (Signed)
 Copied from CRM [id number]. Topic: Clinical - Lab/Test Results >> Sep 19, 2023 11:15 AM Juluis Ok wrote: Reason for CRM: Patient's mother, Neoma Uhrich, returning missed phone call. Relayed lab results per providers note, verbatim. Patient representative verbalized understanding, but has additional questions regarding US . Request a callback at [phone].

## 2023-09-23 ENCOUNTER — Other Ambulatory Visit: Payer: Self-pay | Admitting: Neurology

## 2023-10-03 ENCOUNTER — Ambulatory Visit (AMBULATORY_SURGERY_CENTER)

## 2023-10-03 VITALS — Ht 62.0 in | Wt 172.6 lb

## 2023-10-03 DIAGNOSIS — Z860100 Personal history of colon polyps, unspecified: Secondary | ICD-10-CM

## 2023-10-03 NOTE — Progress Notes (Signed)
 No egg or soy allergy known to patient  No issues known to pt with past sedation with any surgeries or procedures Patient denies ever being told they had issues or difficulty with intubation  No FH of Malignant Hyperthermia Pt is not on diet pills Pt is not on  home 02  Pt is not on blood thinners  Pt denies issues with constipation  No A fib or A flutter Have any cardiac testing pending-- no  LOA: independent  Prep: suprep   Patient's chart reviewed by Rogena Class CNRA prior to previsit and patient appropriate for the [facility].  Previsit completed and red dot placed by patient's name on their procedure day (on provider's schedule).     PV completed with patient using [place] interpreter services.  Prep instructions sent via mychart and hard copy given at [facility] apt.

## 2023-10-09 NOTE — Progress Notes (Unsigned)
 NEUROLOGY FOLLOW UP OFFICE NOTE  [name] [name] [id number]  Assessment/Plan:   Focal onset seizures with impaired consciousness - drop attacks and staring spells.  Some of the drop attacks and dizziness may have been related to hyponatremia secondary to oxcarbazepine .      Continue lamotrigine  ER 300mg  BID and levetiracetam  500mg  BID Follow up in 1 year.   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  She is accompanied by an interpreter.   UPDATE: Current medications: Lamotrigine  ER 300 mg twice daily, levetiracetam  500mg  BID  She has been doing well.  No falls or other spells.  Still may feel a little dizzy.    09/12/2023 LABS:  CBC with WBC 4.5, HGB 13.1, HCT 39.9, PLT 227; CMP with Na 139, K 5, Cl 103, CO2 30, glucose 89, BUN 11, Cr 0.97, t bili 0.5, ALP 109, AST 40, ALT 47.     HISTORY: Two types: 1) Staring spells, not fully respond, sometimes aware, tenses up but no convulsions, lasts seconds to a minute.    2) Drop attacks, loses conscious and falls.  Sometimes able to hold on to something.  She has had recurrence of drop attacks as well as dizzy spells for the past year.  It seems to have started following the death of her father.  The spells were well controlled up until that time.  She does endorse depression.  She has one spell a month, usually at the end of the month.     Due to recurrence of drop attacks, she had another EEG performed on 06/17/14, which was normal.  Due to dizziness, trough levels of her antiepileptic medications were checked to look for toxicity, which were normal.  Levels were normal.    In March 2022, she felt weak and had a syncopal spell, unresponsive for several minutes.  She then has had intermittent episodes of unresponsiveness.  She was brought to  the ED where EEG showed mild diffuse slowing but no epileptiform discharges or seizures.  CT head personally reviewed was negative for acute findings.  Labs were unremarkable, with Na 130, which is baseline.    Due to ongoing staring spells, a 48 year ambulatory EEG was performed in February 2023 which was normal.  She did report episode of lightheadedness with no electrographic correlate.       She also has migraines once in awhile.   Prior medications:  She had side effects to Trileptal  at 900mg .  Other medications tried, include Dilantin (ineffective), and immediate-release lamotrigine  (ineffective), Trileptal  (hyponatremia)   Prior studies: EEG: focal slowing and frequent spike and wave activity in left anterior temporal lobe. MRI Brain (04/13/97): focal area of encephalomalacia in posterior left temporal lobe. MRI Brain (10/19/08): possible left mesial temporal sclerosis and lesion in left posterior temporal lobe.   She does not drive.   When she was [age] years old, she developed a virus following a case of the chicken pox. 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  PAST MEDICAL HISTORY: Past Medical History:  Diagnosis Date   Chicken pox    Conductive hearing loss, childhood onset    Deafness    from medication as a child for chicken pox   Epilepsy (HCC)    Follows w/ Dr. Festus Hubert.  LOV as of 11/30/21 was 11/14/21 with Dr. Festus Hubert in Epic.   Hx of adenomatous colonic polyps 11/19/2016   Metabolic encephalopathy 07/17/2020   See ED note from Dr. Anitra Barn dated 07/17/20 in Epic. Two days after having shoulder surgery, pt had a syncopal episode and was unresponsive for several minutes. Per MD note, patient confusions was suspected to be due to hyponatremia and post-op pain meds.   Pacemaker    2012, LOV with cardiology on 11/22/21 with Mertha Abrahams, PA-C. Cardiac clearance given for 12/15/21 hysterectomy.   Seizures (HCC)    LOV with neurology  11/14/21 Dr. Festus Hubert. Last seizure was 05/31/20 as of 11/30/21.   Vitamin B12 deficiency 2023   currently on supplementation as of 11/30/21   Vitamin D deficiency 2023   currently on supplementation as of 11/30/21   Wears glasses     MEDICATIONS: Current Outpatient Medications on File Prior to Visit  Medication Sig Dispense Refill   acetaminophen  (TYLENOL ) 325 MG tablet Take 650 mg by mouth every 6 (six) hours as needed for mild pain or moderate pain.     cholecalciferol (VITAMIN D3) 25 MCG (1000 UNIT) tablet Take 1,000 Units by mouth 2 (two) times daily.     folic acid  (FOLVITE ) 1 MG tablet Take 1 mg by mouth daily.     ibuprofen  (ADVIL ) 600 MG tablet Take 1 tablet (600 mg total) by mouth every 6 (six) hours. 60 tablet 1   LamoTRIgine  300 MG TB24 24 hour tablet Take 1 tablet by mouth twice daily 60 tablet 1   levETIRAcetam  (KEPPRA ) 500 MG tablet Take 1 tablet by mouth twice daily 180 tablet 0   Magnesium 400 MG CAPS Take 400 mg by mouth daily.     menthol -cetylpyridinium (CEPACOL) 3 MG lozenge Take 1 lozenge (3 mg total) by mouth every 4 (four) hours as needed for sore throat. (Patient not taking: Reported on 10/03/2023) 30 tablet 1   Multiple Vitamin (MULTIVITAMIN WITH MINERALS) TABS tablet Take 2 tablets by mouth daily.     vitamin C (ASCORBIC ACID) 500 MG tablet Take 500 mg by mouth daily.     No current facility-administered medications on file prior to visit.    ALLERGIES: No Known Allergies  FAMILY HISTORY: Family History  Problem Relation Age of Onset   Diabetes Father    Hypertension Father    Colon cancer Father 49   Hypertension Mother    Other Mother        skin lesions   Diabetes Mother        may be borderline   Healthy Brother    Healthy Brother    Esophageal cancer Neg Hx    Rectal cancer Neg Hx    Stomach cancer Neg Hx       Objective:  *** General: No acute distress.  Patient appears well-groomed.   Head:  Normocephalic/atraumatic Neck:  Supple.  No paraspinal  tenderness.  Full range of motion. Heart:  Regular rate and rhythm. Neuro:  Alert and oriented.  Speech fluent and not dysarthric.  Language intact.  Deaf.  Otherwise, CN II-XII intact.  Bulk and tone normal.  Muscle strength 5/5 throughout.  Sensation to light touch intact.  Deep tendon reflexes 2+ throughout, toes downgoing.  Gait normal.  Romberg negative.    Janne Members, DO  CC: Osborn Blaze, MD

## 2023-10-10 ENCOUNTER — Encounter: Payer: Self-pay | Admitting: Neurology

## 2023-10-10 ENCOUNTER — Ambulatory Visit (INDEPENDENT_AMBULATORY_CARE_PROVIDER_SITE_OTHER): Admitting: Neurology

## 2023-10-10 VITALS — BP 122/67 | HR 69 | Ht 62.0 in | Wt 171.0 lb

## 2023-10-10 DIAGNOSIS — G40009 Localization-related (focal) (partial) idiopathic epilepsy and epileptic syndromes with seizures of localized onset, not intractable, without status epilepticus: Secondary | ICD-10-CM

## 2023-10-10 NOTE — Patient Instructions (Signed)
 Lamotrigine  ER 300mg  twice daily Levetiracetam  500mg  twice daily

## 2023-10-15 ENCOUNTER — Ambulatory Visit
Admission: RE | Admit: 2023-10-15 | Discharge: 2023-10-15 | Disposition: A | Discharge status: Home / Self Care | Source: Ambulatory Visit | Attending: Family Medicine

## 2023-10-15 DIAGNOSIS — R7989 Other specified abnormal findings of blood chemistry: Secondary | ICD-10-CM

## 2023-10-16 ENCOUNTER — Ambulatory Visit: Payer: Self-pay | Admitting: Family Medicine

## 2023-10-16 ENCOUNTER — Ambulatory Visit
Admission: RE | Admit: 2023-10-16 | Discharge: 2023-10-16 | Disposition: A | Discharge status: Home / Self Care | Source: Ambulatory Visit | Attending: Family Medicine | Admitting: Family Medicine

## 2023-10-16 DIAGNOSIS — R7989 Other specified abnormal findings of blood chemistry: Secondary | ICD-10-CM

## 2023-10-16 DIAGNOSIS — K838 Other specified diseases of biliary tract: Secondary | ICD-10-CM | POA: Diagnosis not present

## 2023-10-16 DIAGNOSIS — K760 Fatty (change of) liver, not elsewhere classified: Secondary | ICD-10-CM | POA: Diagnosis not present

## 2023-10-16 DIAGNOSIS — K829 Disease of gallbladder, unspecified: Secondary | ICD-10-CM | POA: Diagnosis not present

## 2023-10-16 NOTE — Progress Notes (Signed)
 Ok will hold off on any referrals at this time. Ok to close task

## 2023-10-17 ENCOUNTER — Other Ambulatory Visit

## 2023-10-24 NOTE — Progress Notes (Unsigned)
 [facility] History and Physical   Primary Care Physician:  Ozell Heron HERO, MD   Reason for Procedure:  History of colon polyps  Plan:    Colonoscopy     HPI: [name] [name] is a 68 y.o. female presenting for surveillance colonoscopy with a history of colon polyps.  Last exam 2018 as below:  - Two diminutive polyps in the cecum, removed with                            a cold snare. Resected and retrieved.                           - Diverticulosis in the sigmoid colon.                           - The examination was otherwise normal on direct                            and retroflexion views.  Pathology was adenomas Past Medical History:  Diagnosis Date   Chicken pox    Conductive hearing loss, childhood onset    Deafness    from medication as a child for chicken pox   Epilepsy (HCC)    Follows w/ Dr. Skeet. LOV as of 11/30/21 was 11/14/21 with Dr. Skeet in Epic.   Hx of adenomatous colonic polyps 11/19/2016   Metabolic encephalopathy 07/17/2020   See ED note from Dr. Ludivina Hosteller dated 07/17/20 in Epic. Two days after having shoulder surgery, pt had a syncopal episode and was unresponsive for several minutes. Per MD note, patient confusions was suspected to be due to hyponatremia and post-op pain meds.   Pacemaker    2012, LOV with cardiology on 11/22/21 with Charlies Arthur, PA-C. Cardiac clearance given for 12/15/21 hysterectomy.   Seizures (HCC)    LOV with neurology 11/14/21 Dr. Skeet. Last seizure was 05/31/20 as of 11/30/21.   Vitamin B12 deficiency 2023   currently on supplementation as of 11/30/21   Vitamin D deficiency 2023   currently on supplementation as of 11/30/21   Wears glasses     Past Surgical History:  Procedure Laterality Date   ABDOMINAL HYSTERECTOMY Bilateral 12/15/2021   Procedure: HYSTERECTOMY ABDOMINAL with BILATERAL SALPINGO-OOPHORECTOMY;  Surgeon: Kandyce Sor, MD;  Location: [facility];  Service: Gynecology;  Laterality:  Bilateral;   COLONOSCOPY  11/13/2016   [facility], 2 diminutive adenomas   PACEMAKER INSERTION  07/31/2010   ROBOTIC ASSISTED TOTAL HYSTERECTOMY WITH BILATERAL SALPINGO OOPHERECTOMY Bilateral 12/15/2021   Procedure: ATTEMPTED XI ROBOTIC ASSISTED TOTAL HYSTERECTOMY WITH BILATERAL SALPINGO OOPHORECTOMY;  Surgeon: Kandyce Sor, MD;  Location: [facility];  Service: Gynecology;  Laterality: Bilateral;   TOTAL HIP ARTHROPLASTY  07/31/2011   right   TOTAL HIP ARTHROPLASTY  03/01/2012   left   TOTAL SHOULDER ARTHROPLASTY Right 07/15/2020   Procedure: TOTAL SHOULDER ARTHROPLASTY;  Surgeon: Dozier Soulier, MD;  Location: [facility];  Service: Orthopedics;  Laterality: Right;  NEED 120 MINUTES    Prior to Admission medications   Medication Sig Start Date End Date Taking? Authorizing Provider  acetaminophen  (TYLENOL ) 325 MG tablet Take 650 mg by mouth every 6 (six) hours as needed for mild pain or moderate pain.    [provider]  cholecalciferol (VITAMIN D3) 25 MCG (1000 UNIT)  tablet Take 1,000 Units by mouth daily.    [provider]  folic acid  (FOLVITE ) 1 MG tablet Take 1 mg by mouth daily.    [provider]  ibuprofen  (ADVIL ) 600 MG tablet Take 1 tablet (600 mg total) by mouth every 6 (six) hours. 12/17/21   Kandyce Sor, MD  LamoTRIgine  300 MG TB24 24 hour tablet Take 1 tablet by mouth twice daily 09/03/23   Skeet Juliene SAUNDERS, DO  levETIRAcetam  (KEPPRA ) 500 MG tablet Take 1 tablet by mouth twice daily 09/25/23   Skeet Juliene SAUNDERS, DO  Magnesium 400 MG CAPS Take 400 mg by mouth daily.    [provider]  Multiple Vitamin (MULTIVITAMIN WITH MINERALS) TABS tablet Take 2 tablets by mouth daily.    [provider]  vitamin C (ASCORBIC ACID) 500 MG tablet Take 500 mg by mouth daily.    [provider]    Current Outpatient Medications  Medication Sig Dispense Refill   acetaminophen  (TYLENOL ) 325 MG tablet Take 650 mg by mouth  every 6 (six) hours as needed for mild pain or moderate pain.     cholecalciferol (VITAMIN D3) 25 MCG (1000 UNIT) tablet Take 1,000 Units by mouth daily.     folic acid  (FOLVITE ) 1 MG tablet Take 1 mg by mouth daily.     ibuprofen  (ADVIL ) 600 MG tablet Take 1 tablet (600 mg total) by mouth every 6 (six) hours. 60 tablet 1   LamoTRIgine  300 MG TB24 24 hour tablet Take 1 tablet by mouth twice daily 60 tablet 1   levETIRAcetam  (KEPPRA ) 500 MG tablet Take 1 tablet by mouth twice daily 180 tablet 0   Magnesium 400 MG CAPS Take 400 mg by mouth daily.     Multiple Vitamin (MULTIVITAMIN WITH MINERALS) TABS tablet Take 2 tablets by mouth daily.     vitamin C (ASCORBIC ACID) 500 MG tablet Take 500 mg by mouth daily.     No current facility-administered medications for this visit.    Allergies as of 10/25/2023   (No Known Allergies)    Family History  Problem Relation Age of Onset   Diabetes Father    Hypertension Father    Colon cancer Father 84   Hypertension Mother    Other Mother        skin lesions   Diabetes Mother        may be borderline   Healthy Brother    Healthy Brother    Esophageal cancer Neg Hx    Rectal cancer Neg Hx    Stomach cancer Neg Hx     Social History   Socioeconomic History   Marital status: Divorced    Spouse name: Not on file   Number of children: 2   Years of education: Not on file   Highest education level: High school graduate  Occupational History   Not on file  Tobacco Use   Smoking status: Never   Smokeless tobacco: Never  Vaping Use   Vaping status: Never Used  Substance and Sexual Activity   Alcohol use: No    Alcohol/week: 0.0 standard drinks of alcohol    Comment: none   Drug use: No   Sexual activity: Not on file  Other Topics Concern   Not on file  Social History Narrative   ** Merged History Encounter **       Work or School: on disability for the deafness, seizure and carpal tunnel      Home Situation: lives  with mother and  father currently - since 06/2012      Spiritual Beliefs: Christian      Lifestyle: walks twice daily      Right handed      Social Drivers of Health   Financial Resource Strain: Low Risk  (08/13/2023)   Overall Financial Resource Strain (CARDIA)    Difficulty of Paying Living Expenses: Not hard at all  Food Insecurity: No Food Insecurity (08/13/2023)   Hunger Vital Sign    Worried About Running Out of Food in the Last Year: Never true    Ran Out of Food in the Last Year: Never true  Transportation Needs: No Transportation Needs (08/13/2023)   PRAPARE - Administrator, Civil Service (Medical): No    Lack of Transportation (Non-Medical): No  Physical Activity: Inactive (08/13/2023)   Exercise Vital Sign    Days of Exercise per Week: 0 days    Minutes of Exercise per Session: 0 min  Stress: No Stress Concern Present (08/13/2023)   [employer] of Occupational Health - Occupational Stress Questionnaire    Feeling of Stress : Not at all  Social Connections: Moderately Integrated (08/13/2023)   Social Connection and Isolation Panel    Frequency of Communication with Friends and Family: More than three times a week    Frequency of Social Gatherings with Friends and Family: More than three times a week    Attends Religious Services: More than 4 times per year    Active Member of [employer] or Organizations: Yes    Attends Engineer, structural: More than 4 times per year    Marital Status: Divorced  Intimate Partner Violence: Not At Risk (08/13/2023)   Humiliation, Afraid, Rape, and Kick questionnaire    Fear of Current or Ex-Partner: No    Emotionally Abused: No    Physically Abused: No    Sexually Abused: No    Review of Systems: Positive for *** All other review of systems negative except as mentioned in the HPI.  Physical Exam: Vital signs LMP 08/29/2005   General:   Alert,  Well-developed, well-nourished, pleasant and cooperative in NAD Lungs:  Clear  throughout to auscultation.   Heart:  Regular rate and rhythm; no murmurs, clicks, rubs,  or gallops. Abdomen:  Soft, nontender and nondistended. Normal bowel sounds.   Neuro/Psych:  Alert and cooperative. Normal mood and affect. A and O x 3   @Nevah Dalal  CHARLENA Commander, MD, [facility] [phone] (pager) 10/24/2023 6:31 PM@

## 2023-10-25 ENCOUNTER — Encounter: Payer: Self-pay | Admitting: Internal Medicine

## 2023-10-25 ENCOUNTER — Ambulatory Visit: Admitting: Internal Medicine

## 2023-10-25 VITALS — BP 152/76 | HR 61 | Temp 98.4°F | Resp 9 | Ht 62.0 in | Wt 172.6 lb

## 2023-10-25 DIAGNOSIS — Z860101 Personal history of adenomatous and serrated colon polyps: Secondary | ICD-10-CM | POA: Diagnosis not present

## 2023-10-25 DIAGNOSIS — D120 Benign neoplasm of cecum: Secondary | ICD-10-CM | POA: Diagnosis not present

## 2023-10-25 DIAGNOSIS — K5730 Diverticulosis of large intestine without perforation or abscess without bleeding: Secondary | ICD-10-CM

## 2023-10-25 DIAGNOSIS — Z1211 Encounter for screening for malignant neoplasm of colon: Secondary | ICD-10-CM | POA: Diagnosis not present

## 2023-10-25 DIAGNOSIS — Z860100 Personal history of colon polyps, unspecified: Secondary | ICD-10-CM

## 2023-10-25 DIAGNOSIS — Z800 Family history of malignant neoplasm of digestive organs: Secondary | ICD-10-CM | POA: Diagnosis not present

## 2023-10-25 MED ORDER — SODIUM CHLORIDE 0.9 % IV SOLN: 500.0000 mL | INTRAVENOUS | Status: DC

## 2023-10-25 NOTE — Patient Instructions (Addendum)
 There was 1 tiny polyp found and removed.  I will let you know pathology results and when to have another routine colonoscopy by mail and/or My Chart.  You also have a condition called diverticulosis - common and not usually a problem. Please read the handout provided.  I appreciate the opportunity to care for you. Lupita CHARLENA Commander, MD, [facility]  Resume previous diet Continue present medications  YOU HAD AN ENDOSCOPIC PROCEDURE TODAY AT THE [facility]:   Refer to the procedure report that was given to you for any specific questions about what was found during the examination.  If the procedure report does not answer your questions, please call your gastroenterologist to clarify.  If you requested that your care partner not be given the details of your procedure findings, then the procedure report has been included in a sealed envelope for you to review at your convenience later.  YOU SHOULD EXPECT: Some feelings of bloating in the abdomen. Passage of more gas than usual.  Walking can help get rid of the air that was put into your GI tract during the procedure and reduce the bloating. If you had a lower endoscopy (such as a colonoscopy or flexible sigmoidoscopy) you may notice spotting of blood in your stool or on the toilet paper. If you underwent a bowel prep for your procedure, you may not have a normal bowel movement for a few days.  Please Note:  You might notice some irritation and congestion in your nose or some drainage.  This is from the oxygen used during your procedure.  There is no need for concern and it should clear up in a day or so.  SYMPTOMS TO REPORT IMMEDIATELY:  Following lower endoscopy (colonoscopy or flexible sigmoidoscopy):  Excessive amounts of blood in the stool  Significant tenderness or worsening of abdominal pains  Swelling of the abdomen that is new, acute  Fever of 100F or higher  For urgent or emergent issues, a gastroenterologist can be reached at  any hour by calling [phone]. Do not use MyChart messaging for urgent concerns.    DIET:  We do recommend a small meal at first, but then you may proceed to your regular diet.  Drink plenty of fluids but you should avoid alcoholic beverages for 24 hours.  ACTIVITY:  You should plan to take it easy for the rest of today and you should NOT DRIVE or use heavy machinery until tomorrow (because of the sedation medicines used during the test).    FOLLOW UP: Our staff will call the number listed on your records the next business day following your procedure.  We will call around 7:15- 8:00 am to check on you and address any questions or concerns that you may have regarding the information given to you following your procedure. If we do not reach you, we will leave a message.     If any biopsies were taken you will be contacted by phone or by letter within the next 1-3 weeks.  Please call us  at [phone] if you have not heard about the biopsies in 3 weeks.    SIGNATURES/CONFIDENTIALITY: You and/or your care partner have signed paperwork which will be entered into your electronic medical record.  These signatures attest to the fact that that the information above on your After Visit Summary has been reviewed and is understood.  Full responsibility of the confidentiality of this discharge information lies with you and/or your care-partner.

## 2023-10-25 NOTE — Progress Notes (Signed)
 Pt's states no medical or surgical changes since previsit or office visit.

## 2023-10-25 NOTE — Op Note (Signed)
 [facility] Patient Name: [name] [name] Procedure Date: 10/25/2023 11:53 AM MRN: [id number] Endoscopist: Lupita FORBES Commander , MD, [id number] Age: 68 Referring MD:  Date of Birth: Feb 10, 1956 Gender: Female Account #: [id number] Procedure:                Colonoscopy Indications:              Surveillance: Personal history of adenomatous                            polyps on last colonoscopy > 5 years ago, Last                            colonoscopy: 2018 Medicines:                Monitored Anesthesia Care Procedure:                Pre-Anesthesia Assessment:                           - Prior to the procedure, a History and Physical                            was performed, and patient medications and                            allergies were reviewed. The patient's tolerance of                            previous anesthesia was also reviewed. The risks                            and benefits of the procedure and the sedation                            options and risks were discussed with the patient.                            All questions were answered, and informed consent                            was obtained. Prior Anticoagulants: The patient has                            taken no anticoagulant or antiplatelet agents. ASA                            Grade Assessment: III - A patient with severe                            systemic disease. After reviewing the risks and                            benefits, the patient was deemed in satisfactory  condition to undergo the procedure.                           After obtaining informed consent, the colonoscope                            was passed under direct vision. Throughout the                            procedure, the patient's blood pressure, pulse, and                            oxygen saturations were monitored continuously. The                            CF [id number] #[id number] was introduced through the  anus                            and advanced to the the cecum, identified by                            appendiceal orifice and ileocecal valve. The                            colonoscopy was performed without difficulty. The                            patient tolerated the procedure well. The quality                            of the bowel preparation was adequate. The                            ileocecal valve, appendiceal orifice, and rectum                            were photographed. The bowel preparation used was                            Miralax  via split dose instruction. Scope In: 11:59:37 AM Scope Out: 12:18:09 PM Scope Withdrawal Time: 0 hours 13 minutes 50 seconds  Total Procedure Duration: 0 hours 18 minutes 32 seconds  Findings:                 The perianal and digital rectal examinations were                            normal.                           A diminutive polyp was found in the cecum. The                            polyp was sessile. The polyp was removed with a  cold snare. Resection and retrieval were complete.                            Verification of patient identification for the                            specimen was done. Estimated blood loss was minimal.                           Multiple diverticula were found in the sigmoid                            colon.                           The exam was otherwise without abnormality on                            direct and retroflexion views. Complications:            No immediate complications. Estimated Blood Loss:     Estimated blood loss was minimal. Impression:               - One diminutive polyp in the cecum, removed with a                            cold snare. Resected and retrieved.                           - Diverticulosis in the sigmoid colon.                           - The examination was otherwise normal on direct                            and retroflexion  views.                           - Personal history of colonic polyps. 2 diminutive                            adenomas 2018. Father had CRCA diagnosed at 26 Recommendation:           - Patient has a contact number available for                            emergencies. The signs and symptoms of potential                            delayed complications were discussed with the                            patient. Return to normal activities tomorrow.                            Written discharge instructions were  provided to the                            patient.                           - Resume previous diet.                           - Continue present medications.                           - Repeat colonoscopy is recommended for                            surveillance. The colonoscopy date will be                            determined after pathology results from today's                            exam become available for review. Lupita FORBES Commander, MD 10/25/2023 12:29:17 PM This report has been signed electronically.

## 2023-10-25 NOTE — Progress Notes (Signed)
To Pacu, VSS. Report to [email or web address] 

## 2023-10-25 NOTE — Progress Notes (Signed)
 Called to room to assist during endoscopic procedure.  Patient ID and intended procedure confirmed with present staff. Received instructions for my participation in the procedure from the performing physician.

## 2023-10-26 ENCOUNTER — Telehealth: Payer: Self-pay

## 2023-10-26 NOTE — Telephone Encounter (Signed)
Attempted to reach patient for post-procedure f/u call. No answer. Left message for her to please not hesitate to call if she has any questions/concerns regarding her care. 

## 2023-10-29 ENCOUNTER — Ambulatory Visit: Payer: Self-pay | Admitting: Internal Medicine

## 2023-10-29 LAB — SURGICAL PATHOLOGY

## 2023-11-05 ENCOUNTER — Other Ambulatory Visit: Payer: Self-pay | Admitting: Neurology

## 2023-11-05 DIAGNOSIS — G40009 Localization-related (focal) (partial) idiopathic epilepsy and epileptic syndromes with seizures of localized onset, not intractable, without status epilepticus: Secondary | ICD-10-CM

## 2023-12-23 ENCOUNTER — Other Ambulatory Visit: Payer: Self-pay | Admitting: Neurology

## 2024-01-09 ENCOUNTER — Ambulatory Visit: Payer: Self-pay

## 2024-01-09 NOTE — Telephone Encounter (Signed)
 Spoke with the patient's mother for more information.  Ms Wallman the patient noticed blood with urination and this only occurred once.  She asked if the patient could wait until her physical next month and was advised due to symptoms, patient should be seen sooner.  Appt was scheduled with PCP on 9/12.

## 2024-01-09 NOTE — Telephone Encounter (Signed)
 FYI Only or Action Required?: Action required by provider: Refusing ED, requesting call back asap with next steps, alerted CAL to ED refusal.  Patient was last seen in primary care on 09/12/2023 by [name] Heron HERO, MD.  Called Nurse Triage reporting lower abdominal/pelvic pain, drops of blood in toilet when urinating, and uncertain origin of blood.  Symptoms began several days ago.  Interventions attempted: Nothing.  Symptoms are: worsening overall but no current symptoms since this morning.  Triage Disposition: Go to ED Now (Notify PCP)  Patient/caregiver understands and will follow disposition?: No, refuses disposition      Copied from CRM [id number]. Topic: Clinical - Red Word Triage >> Jan 09, 2024 11:57 AM Shereese L wrote: [facility] that prompted transfer to Nurse Triage: Spot of blood when she uses the bathroom and possible UTI and when she wakes up in the morning in the lower front abdomin area Reason for Disposition  Passing pure blood or large blood clots (i.e., size > a dime)  (Exception: Fleck or small strands.)  Answer Assessment - Initial Assessment Questions Mom Daizee Firmin on phone with pt in background using sign language  1. COLOR of URINE: Describe the color of the urine.  (e.g., tea-colored, pink, red, bloody) Do you have blood clots in your urine? (e.g., none, pea, grape, small coin)     Spot of blood  Not bad just little tiny dots of blood Not having abdominal pain all the time, just in morning when wakes up eases up later hard to explain, lot of pain this morning, pain first started 2 days ago but wasn't as bad then Lot pain and cramping this morning, more where urinates This has come on lot of times Seems lower than lower abdomen, upper from where pee comes out though Hysterectomy a year ago Wondering where coming from, just spots of blood when she peed No blood in stool Urine is light yellow 2. ONSET: When did the bleeding start?      2 days ago  but not since then Pain started this morning but now easing off 4. PAIN with URINATION: Is there any pain with passing your urine? If Yes, ask: How bad is the pain?  (Scale 1-10; or mild, moderate, severe)     Doesn't hurt when urinating 5. FEVER: Do you have a fever? If Yes, ask: What is your temperature, how was it measured, and when did it start?     no 6. ASSOCIATED SYMPTOMS: Are you passing urine more frequently than usual?     Goes 3-4x during night, pretty common for her 7. OTHER SYMPTOMS: Do you have any other symptoms? (e.g., back/flank pain, abdomen pain, vomiting)     No back pain or vomiting Producing streams of urine rather than droplets  Mom states Wonder if passing some stones Drops of blood go down the toilet   Prefer not go to ED, been advised by doctors even to avoid ED Requesting if could have pt daughter bring pt for appt on Monday to interpret for her or if could give urine sample Needs an interpreter    Advised pt go to ED for symptoms, refusing ED at this time, requesting next steps from PCP, sending message to PCP for call back asap to pt mom at [phone]. Alerted CAL to ED refusal.  Protocols used: Urine - Blood In-A-AH

## 2024-01-11 ENCOUNTER — Encounter: Payer: Self-pay | Admitting: Family Medicine

## 2024-01-11 ENCOUNTER — Ambulatory Visit (INDEPENDENT_AMBULATORY_CARE_PROVIDER_SITE_OTHER): Admitting: Family Medicine

## 2024-01-11 VITALS — BP 112/70 | HR 75 | Temp 97.6°F | Wt 164.4 lb

## 2024-01-11 DIAGNOSIS — R300 Dysuria: Secondary | ICD-10-CM

## 2024-01-11 MED ORDER — NITROFURANTOIN MONOHYD MACRO 100 MG PO CAPS: 100.0000 mg | ORAL_CAPSULE | Freq: Two times a day (BID) | ORAL | 0 refills | Status: AC

## 2024-01-11 NOTE — Progress Notes (Signed)
 Acute Office Visit  Subjective:     Patient ID: [name] [name], female    DOB: 05/27/1955, 68 y.o.   MRN: [id number]  Chief Complaint  Patient presents with   Hematuria    Patient complains of burning with urination and blood noted x2 days    Hematuria Pertinent negatives include no chills or fever.   Discussed the use of AI scribe software for clinical note transcription with the patient, who gave verbal consent to proceed.  History of Present Illness   [name] [name] is a 68 year old female who presents with difficulty urinating and possible urinary tract infection.  She has been experiencing difficulty urinating for two days, with symptoms starting on Wednesday. She describes burning during urination and has noticed small spots of blood in her urine, although there is no blood on the tissue paper. She recalls a similar issue at the end of August, which resolved on its own.  No fever, chills, nausea, vomiting, or pain in the middle part of her back. She reports cramping in her lower abdomen and some pelvic pain. She is not experiencing increased urinary frequency.  Approximately two months ago, during a visit to her colon doctor, she was informed of a 'little tiny bladder infection' but did not receive any medication at that time.       Review of Systems  Constitutional:  Negative for chills and fever.  Genitourinary:  Positive for hematuria.  All other systems reviewed and are negative.       Objective:    BP 112/70   Pulse 75   Temp 97.6 F (36.4 C) (Oral)   Wt 164 lb 6.4 oz (74.6 kg)   LMP 08/29/2005   SpO2 97%   BMI 30.07 kg/m    Physical Exam Vitals reviewed.  Constitutional:      Appearance: Normal appearance. She is normal weight.  Cardiovascular:     Rate and Rhythm: Normal rate and regular rhythm.     Heart sounds: Normal heart sounds. No murmur heard. Pulmonary:     Effort: Pulmonary effort is normal.     Breath sounds: Normal breath sounds. No  wheezing.  Abdominal:     General: Bowel sounds are normal.     Tenderness: There is no right CVA tenderness or left CVA tenderness.  Neurological:     Mental Status: She is alert and oriented to person, place, and time. Mental status is at baseline.     No results found for any visits on 01/11/24.      Assessment & Plan:   Problem List Items Addressed This Visit   None Visit Diagnoses       Dysuria    -  Primary   Relevant Medications   nitrofurantoin , macrocrystal-monohydrate, (MACROBID ) 100 MG capsule   Other Relevant Orders   Urinalysis, Complete     Assessment and Plan    Acute urinary tract infection with hematuria and dysuria Urinary tract infection likely. Empirical antibiotics considered due to symptom severity and weekend timing. - Order urinalysis to confirm infection. - Prescribe antibiotics for pickup at [facility] pharmacy. - Advise increased fluid intake. - Instruct to return with urine sample before 4:30 PM for lab analysis. - Discussed starting antibiotics over the weekend if symptoms persist.        Meds ordered this encounter  Medications   nitrofurantoin , macrocrystal-monohydrate, (MACROBID ) 100 MG capsule    Sig: Take 1 capsule (100 mg total) by mouth 2 (two) times daily.  Dispense:  14 capsule    Refill:  0    No follow-ups on file.  [name] CHRISTELLA Sharper, MD

## 2024-01-12 LAB — URINALYSIS, COMPLETE
Bilirubin Urine: NEGATIVE
Glucose, UA: NEGATIVE
Hgb urine dipstick: NEGATIVE
Hyaline Cast: NONE SEEN /LPF
Ketones, ur: NEGATIVE
Nitrite: NEGATIVE
Protein, ur: NEGATIVE
Specific Gravity, Urine: 1.012 (ref 1.001–1.035)
pH: 6.5 (ref 5.0–8.0)

## 2024-01-12 LAB — EXTRA URINE SPECIMEN

## 2024-01-15 ENCOUNTER — Ambulatory Visit: Payer: Self-pay | Admitting: Family Medicine

## 2024-02-18 ENCOUNTER — Ambulatory Visit: Attending: Family Medicine

## 2024-02-27 ENCOUNTER — Ambulatory Visit: Attending: Student in an Organized Health Care Education/Training Program

## 2024-02-27 DIAGNOSIS — I495 Sick sinus syndrome: Secondary | ICD-10-CM | POA: Diagnosis not present

## 2024-02-28 LAB — CUP PACEART REMOTE DEVICE CHECK
Battery Impedance: 2227 Ohm
Battery Remaining Longevity: 39 mo
Battery Voltage: 2.74 V
Brady Statistic AP VP Percent: 0 %
Brady Statistic AP VS Percent: 0 %
Brady Statistic AS VP Percent: 1 %
Brady Statistic AS VS Percent: 99 %
Date Time Interrogation Session: 20251029132746
Implantable Lead Connection Status: 753985
Implantable Lead Connection Status: 753985
Implantable Lead Implant Date: 20120409
Implantable Lead Implant Date: 20120409
Implantable Lead Location: 753859
Implantable Lead Location: 753860
Implantable Lead Model: 4076
Implantable Lead Model: 4076
Implantable Pulse Generator Implant Date: 20120409
Lead Channel Impedance Value: 587 Ohm
Lead Channel Impedance Value: 623 Ohm
Lead Channel Pacing Threshold Amplitude: 0.625 V
Lead Channel Pacing Threshold Amplitude: 0.625 V
Lead Channel Pacing Threshold Pulse Width: 0.4 ms
Lead Channel Pacing Threshold Pulse Width: 0.4 ms
Lead Channel Setting Pacing Amplitude: 2 V
Lead Channel Setting Pacing Amplitude: 2.5 V
Lead Channel Setting Pacing Pulse Width: 0.4 ms
Lead Channel Setting Sensing Sensitivity: 5.6 mV
Zone Setting Status: 755011
Zone Setting Status: 755011

## 2024-03-01 ENCOUNTER — Ambulatory Visit: Payer: Self-pay | Admitting: Student in an Organized Health Care Education/Training Program

## 2024-03-05 ENCOUNTER — Encounter: Payer: Self-pay | Admitting: Cardiology

## 2024-03-05 ENCOUNTER — Ambulatory Visit: Attending: Physician Assistant | Admitting: Cardiology

## 2024-03-05 VITALS — BP 110/64 | HR 75 | Ht 62.0 in | Wt 163.0 lb

## 2024-03-05 DIAGNOSIS — Z950 Presence of cardiac pacemaker: Secondary | ICD-10-CM

## 2024-03-05 DIAGNOSIS — I10 Essential (primary) hypertension: Secondary | ICD-10-CM | POA: Diagnosis not present

## 2024-03-05 DIAGNOSIS — I495 Sick sinus syndrome: Secondary | ICD-10-CM

## 2024-03-05 LAB — CUP PACEART INCLINIC DEVICE CHECK
Battery Impedance: 2313 Ohm
Battery Remaining Longevity: 37 mo
Battery Voltage: 2.74 V
Brady Statistic AP VP Percent: 0 %
Brady Statistic AP VS Percent: 0 %
Brady Statistic AS VP Percent: 1 %
Brady Statistic AS VS Percent: 99 %
Date Time Interrogation Session: 20251105102909
Implantable Lead Connection Status: 753985
Implantable Lead Connection Status: 753985
Implantable Lead Implant Date: 20120409
Implantable Lead Implant Date: 20120409
Implantable Lead Location: 753859
Implantable Lead Location: 753860
Implantable Lead Model: 4076
Implantable Lead Model: 4076
Implantable Pulse Generator Implant Date: 20120409
Lead Channel Impedance Value: 563 Ohm
Lead Channel Impedance Value: 611 Ohm
Lead Channel Pacing Threshold Amplitude: 0.5 V
Lead Channel Pacing Threshold Amplitude: 0.625 V
Lead Channel Pacing Threshold Amplitude: 0.625 V
Lead Channel Pacing Threshold Amplitude: 0.75 V
Lead Channel Pacing Threshold Pulse Width: 0.4 ms
Lead Channel Pacing Threshold Pulse Width: 0.4 ms
Lead Channel Pacing Threshold Pulse Width: 0.4 ms
Lead Channel Pacing Threshold Pulse Width: 0.4 ms
Lead Channel Sensing Intrinsic Amplitude: 22.4 mV
Lead Channel Sensing Intrinsic Amplitude: 4 mV
Lead Channel Setting Pacing Amplitude: 2 V
Lead Channel Setting Pacing Amplitude: 2.5 V
Lead Channel Setting Pacing Pulse Width: 0.4 ms
Lead Channel Setting Sensing Sensitivity: 5.6 mV
Zone Setting Status: 755011
Zone Setting Status: 755011

## 2024-03-05 NOTE — Progress Notes (Signed)
 Remote PPM Transmission

## 2024-03-05 NOTE — Patient Instructions (Signed)
 Medication Instructions:   Your physician recommends that you continue on your current medications as directed. Please refer to the Current Medication list given to you today.    *If you need a refill on your cardiac medications before your next appointment, please call your pharmacy*   Lab Work: NONE ORDERED  TODAY     If you have labs (blood work) drawn today and your tests are completely normal, you will receive your results only by: MyChart Message (if you have MyChart) OR A paper copy in the mail If you have any lab test that is abnormal or we need to change your treatment, we will call you to review the results.    Testing/Procedures: NONE ORDERED  TODAY    Follow-Up: At [facility], you and your health needs are our priority.  As part of our continuing mission to provide you with exceptional heart care, our providers are all part of one team.  This team includes your primary Cardiologist (physician) and Advanced Practice Providers or APPs (Physician Assistants and Nurse Practitioners) who all work together to provide you with the care you need, when you need it.  Your next appointment:  1 year(s) Provider:  Donnice Primus, MD    We recommend signing up for the patient portal called MyChart.  Sign up information is provided on this After Visit Summary.  MyChart is used to connect with patients for Virtual Visits (Telemedicine).  Patients are able to view lab/test results, encounter notes, upcoming appointments, etc.  Non-urgent messages can be sent to your provider as well.   To learn more about what you can do with MyChart, go to [email or web address].   Other Instructions

## 2024-03-05 NOTE — Progress Notes (Signed)
  Electrophysiology Office Note:   ID:  [name] [name], [name] [name] Nov 01, 1955, MRN [id number]  Primary Cardiologist: None Electrophysiologist: Donnice DELENA Primus, MD      History of Present Illness:   [name] [name] is a 68 y.o. 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   Patient had PPM implanted in 2012 after experiencing asystole during a tilt table test.   Since last being seen in our clinic the patient reports doing well.  she denies chest pain, palpitations, dyspnea, PND, orthopnea, nausea, vomiting, dizziness, syncope.  Review of systems complete and found to be negative unless listed in HPI.   EP Information / Studies Reviewed:    EKG is ordered today. Personal review as below.       PPM Interrogation-  reviewed in detail today,  See PACEART report.  Arrhythmia/Device History PPM-Medtronic-Carelink   Physical Exam:   VS:  LMP 08/29/2005    Wt Readings from Last 3 Encounters:  01/11/24 164 lb 6.4 oz (74.6 kg)  10/25/23 172 lb 9.6 oz (78.3 kg)  10/10/23 171 lb (77.6 kg)     GEN: No acute distress  NECK: No JVD; No carotid bruits CARDIAC: Regular rate and rhythm, no murmurs, rubs, gallops RESPIRATORY:  Clear to auscultation without rales, wheezing or rhonchi  ABDOMEN: Soft, non-tender, non-distended EXTREMITIES:  No edema; No deformity   ASSESSMENT AND PLAN:    SND s/p Medtronic PPM  Normal PPM function See Pace Art report No changes today  Hypertension BP well controlled today. Continue current regimen.     Disposition:   Follow up with Dr. Primus in 12 months  Signed, Artist Pouch, PA-C

## 2024-03-09 ENCOUNTER — Ambulatory Visit: Payer: Self-pay | Admitting: Student in an Organized Health Care Education/Training Program

## 2024-05-19 ENCOUNTER — Ambulatory Visit

## 2024-05-28 ENCOUNTER — Ambulatory Visit: Attending: Student in an Organized Health Care Education/Training Program

## 2024-05-28 DIAGNOSIS — I495 Sick sinus syndrome: Secondary | ICD-10-CM

## 2024-05-28 LAB — CUP PACEART REMOTE DEVICE CHECK
Battery Impedance: 2313 Ohm
Battery Remaining Longevity: 38 mo
Battery Voltage: 2.73 V
Brady Statistic AP VP Percent: 0 %
Brady Statistic AP VS Percent: 0 %
Brady Statistic AS VP Percent: 1 %
Brady Statistic AS VS Percent: 99 %
Date Time Interrogation Session: 20260128124757
Implantable Lead Connection Status: 753985
Implantable Lead Connection Status: 753985
Implantable Lead Implant Date: 20120409
Implantable Lead Implant Date: 20120409
Implantable Lead Location: 753859
Implantable Lead Location: 753860
Implantable Lead Model: 4076
Implantable Lead Model: 4076
Implantable Pulse Generator Implant Date: 20120409
Lead Channel Impedance Value: 572 Ohm
Lead Channel Impedance Value: 608 Ohm
Lead Channel Pacing Threshold Amplitude: 0.5 V
Lead Channel Pacing Threshold Amplitude: 0.625 V
Lead Channel Pacing Threshold Pulse Width: 0.4 ms
Lead Channel Pacing Threshold Pulse Width: 0.4 ms
Lead Channel Setting Pacing Amplitude: 2 V
Lead Channel Setting Pacing Amplitude: 2.5 V
Lead Channel Setting Pacing Pulse Width: 0.4 ms
Lead Channel Setting Sensing Sensitivity: 5.6 mV
Zone Setting Status: 755011
Zone Setting Status: 755011

## 2024-06-01 ENCOUNTER — Other Ambulatory Visit: Payer: Self-pay | Admitting: Neurology

## 2024-06-01 ENCOUNTER — Ambulatory Visit: Payer: Self-pay | Admitting: Student in an Organized Health Care Education/Training Program

## 2024-06-01 DIAGNOSIS — G40009 Localization-related (focal) (partial) idiopathic epilepsy and epileptic syndromes with seizures of localized onset, not intractable, without status epilepticus: Secondary | ICD-10-CM

## 2024-06-04 ENCOUNTER — Telehealth: Payer: Self-pay | Admitting: Neurology

## 2024-06-04 DIAGNOSIS — G40009 Localization-related (focal) (partial) idiopathic epilepsy and epileptic syndromes with seizures of localized onset, not intractable, without status epilepticus: Secondary | ICD-10-CM

## 2024-06-04 MED ORDER — LAMOTRIGINE ER 300 MG PO TB24: 1.0000 | ORAL_TABLET | Freq: Two times a day (BID) | ORAL | 6 refills | Status: AC

## 2024-06-04 MED ORDER — LEVETIRACETAM 500 MG PO TABS: 500.0000 mg | ORAL_TABLET | Freq: Two times a day (BID) | ORAL | 2 refills | Status: AC

## 2024-06-04 NOTE — Telephone Encounter (Signed)
 Pt needs a refill on her lamotrigine  300MG  [facility] on [address]

## 2024-06-04 NOTE — Telephone Encounter (Signed)
 Refilled

## 2024-06-05 NOTE — Progress Notes (Signed)
 Remote PPM Transmission

## 2024-08-18 ENCOUNTER — Ambulatory Visit

## 2024-08-27 ENCOUNTER — Ambulatory Visit

## 2024-09-12 ENCOUNTER — Encounter: Admitting: Family Medicine

## 2024-10-13 ENCOUNTER — Ambulatory Visit: Admitting: Neurology

## 2024-11-17 ENCOUNTER — Ambulatory Visit

## 2024-11-26 ENCOUNTER — Ambulatory Visit

## 2025-02-16 ENCOUNTER — Ambulatory Visit

## 2025-02-25 ENCOUNTER — Ambulatory Visit

## 2025-05-18 ENCOUNTER — Ambulatory Visit

## 2025-05-27 ENCOUNTER — Ambulatory Visit
